# Patient Record
Sex: Female | Born: 1935 | Race: White | Hispanic: No | Smoking: Never smoker
Health system: Southern US, Community
[De-identification: ages and names within clinical notes are randomized; demographics above are authoritative.]

## PROBLEM LIST (undated history)

## (undated) DIAGNOSIS — C50919 Malignant neoplasm of unspecified site of unspecified female breast: Secondary | ICD-10-CM

## (undated) DIAGNOSIS — IMO0001 Reserved for inherently not codable concepts without codable children: Secondary | ICD-10-CM

## (undated) DIAGNOSIS — Z950 Presence of cardiac pacemaker: Secondary | ICD-10-CM

## (undated) DIAGNOSIS — M419 Scoliosis, unspecified: Secondary | ICD-10-CM

## (undated) DIAGNOSIS — S99912A Unspecified injury of left ankle, initial encounter: Secondary | ICD-10-CM

## (undated) DIAGNOSIS — E785 Hyperlipidemia, unspecified: Secondary | ICD-10-CM

## (undated) DIAGNOSIS — M810 Age-related osteoporosis without current pathological fracture: Secondary | ICD-10-CM

## (undated) DIAGNOSIS — G459 Transient cerebral ischemic attack, unspecified: Secondary | ICD-10-CM

## (undated) DIAGNOSIS — R011 Cardiac murmur, unspecified: Secondary | ICD-10-CM

## (undated) DIAGNOSIS — Z923 Personal history of irradiation: Secondary | ICD-10-CM

## (undated) DIAGNOSIS — M1990 Unspecified osteoarthritis, unspecified site: Secondary | ICD-10-CM

## (undated) DIAGNOSIS — K449 Diaphragmatic hernia without obstruction or gangrene: Secondary | ICD-10-CM

## (undated) DIAGNOSIS — H269 Unspecified cataract: Secondary | ICD-10-CM

## (undated) DIAGNOSIS — E039 Hypothyroidism, unspecified: Secondary | ICD-10-CM

## (undated) DIAGNOSIS — K219 Gastro-esophageal reflux disease without esophagitis: Secondary | ICD-10-CM

## (undated) DIAGNOSIS — R002 Palpitations: Secondary | ICD-10-CM

## (undated) DIAGNOSIS — I447 Left bundle-branch block, unspecified: Secondary | ICD-10-CM

## (undated) DIAGNOSIS — F329 Major depressive disorder, single episode, unspecified: Secondary | ICD-10-CM

## (undated) DIAGNOSIS — R5383 Other fatigue: Secondary | ICD-10-CM

## (undated) DIAGNOSIS — H409 Unspecified glaucoma: Secondary | ICD-10-CM

## (undated) DIAGNOSIS — I10 Essential (primary) hypertension: Secondary | ICD-10-CM

## (undated) DIAGNOSIS — H9190 Unspecified hearing loss, unspecified ear: Secondary | ICD-10-CM

## (undated) DIAGNOSIS — G629 Polyneuropathy, unspecified: Secondary | ICD-10-CM

## (undated) DIAGNOSIS — E041 Nontoxic single thyroid nodule: Secondary | ICD-10-CM

## (undated) DIAGNOSIS — F32A Depression, unspecified: Secondary | ICD-10-CM

## (undated) HISTORY — DX: Age-related osteoporosis without current pathological fracture: M81.0

## (undated) HISTORY — DX: Essential (primary) hypertension: I10

## (undated) HISTORY — DX: Presence of cardiac pacemaker: Z95.0

## (undated) HISTORY — DX: Hyperlipidemia, unspecified: E78.5

## (undated) HISTORY — DX: Hypothyroidism, unspecified: E03.9

## (undated) HISTORY — PX: EYE SURGERY: SHX253

## (undated) HISTORY — DX: Diaphragmatic hernia without obstruction or gangrene: K44.9

## (undated) HISTORY — DX: Nontoxic single thyroid nodule: E04.1

## (undated) HISTORY — DX: Left bundle-branch block, unspecified: I44.7

## (undated) HISTORY — PX: BREAST SURGERY: SHX581

## (undated) HISTORY — DX: Unspecified glaucoma: H40.9

## (undated) HISTORY — DX: Unspecified injury of left ankle, initial encounter: S99.912A

## (undated) HISTORY — DX: Malignant neoplasm of unspecified site of unspecified female breast: C50.919

## (undated) HISTORY — PX: MASTECTOMY PARTIAL / LUMPECTOMY: SUR851

## (undated) HISTORY — PX: THYROIDECTOMY, PARTIAL: SHX18

## (undated) HISTORY — DX: Transient cerebral ischemic attack, unspecified: G45.9

## (undated) HISTORY — DX: Unspecified cataract: H26.9

## (undated) HISTORY — PX: ABDOMINAL HYSTERECTOMY: SHX81

---
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
## 2003-07-24 HISTORY — PX: BREAST BIOPSY: SHX20

## 2004-07-06 ENCOUNTER — Ambulatory Visit: Payer: Self-pay | Admitting: Internal Medicine

## 2004-07-20 ENCOUNTER — Ambulatory Visit: Payer: Self-pay | Admitting: Internal Medicine

## 2004-10-20 ENCOUNTER — Ambulatory Visit: Payer: Self-pay | Admitting: Unknown Physician Specialty

## 2005-09-10 ENCOUNTER — Ambulatory Visit: Payer: Self-pay | Admitting: Internal Medicine

## 2005-10-27 ENCOUNTER — Emergency Department: Payer: Self-pay | Admitting: Emergency Medicine

## 2006-10-03 ENCOUNTER — Ambulatory Visit: Payer: Self-pay | Admitting: Internal Medicine

## 2007-10-08 ENCOUNTER — Ambulatory Visit: Payer: Self-pay | Admitting: Internal Medicine

## 2007-11-21 ENCOUNTER — Ambulatory Visit: Payer: Self-pay | Admitting: Unknown Physician Specialty

## 2008-10-12 ENCOUNTER — Ambulatory Visit: Payer: Self-pay | Admitting: Internal Medicine

## 2009-10-17 ENCOUNTER — Ambulatory Visit: Payer: Self-pay | Admitting: Internal Medicine

## 2010-11-16 ENCOUNTER — Ambulatory Visit: Payer: Self-pay | Admitting: Internal Medicine

## 2010-11-30 ENCOUNTER — Ambulatory Visit: Payer: Self-pay | Admitting: Internal Medicine

## 2011-07-24 DIAGNOSIS — Z923 Personal history of irradiation: Secondary | ICD-10-CM

## 2011-07-24 DIAGNOSIS — C50919 Malignant neoplasm of unspecified site of unspecified female breast: Secondary | ICD-10-CM

## 2011-07-24 HISTORY — PX: BREAST BIOPSY: SHX20

## 2011-07-24 HISTORY — PX: BREAST EXCISIONAL BIOPSY: SUR124

## 2011-07-24 HISTORY — DX: Malignant neoplasm of unspecified site of unspecified female breast: C50.919

## 2011-07-24 HISTORY — DX: Personal history of irradiation: Z92.3

## 2012-01-01 ENCOUNTER — Ambulatory Visit: Payer: Self-pay | Admitting: Internal Medicine

## 2012-01-02 ENCOUNTER — Ambulatory Visit: Payer: Self-pay | Admitting: Internal Medicine

## 2012-02-11 ENCOUNTER — Ambulatory Visit: Payer: Self-pay | Admitting: Surgery

## 2012-02-21 HISTORY — PX: BREAST LUMPECTOMY: SHX2

## 2012-03-04 ENCOUNTER — Ambulatory Visit: Payer: Self-pay | Admitting: Surgery

## 2012-03-04 DIAGNOSIS — I10 Essential (primary) hypertension: Secondary | ICD-10-CM

## 2012-03-04 LAB — POTASSIUM: Potassium: 3.9 mmol/L (ref 3.5–5.1)

## 2012-03-13 ENCOUNTER — Ambulatory Visit: Payer: Self-pay | Admitting: Surgery

## 2012-03-21 ENCOUNTER — Ambulatory Visit: Payer: Self-pay | Admitting: Oncology

## 2012-03-23 ENCOUNTER — Ambulatory Visit: Payer: Self-pay | Admitting: Oncology

## 2012-03-28 ENCOUNTER — Ambulatory Visit: Payer: Self-pay | Admitting: Radiation Oncology

## 2012-04-22 ENCOUNTER — Ambulatory Visit: Payer: Self-pay | Admitting: Oncology

## 2012-04-22 ENCOUNTER — Ambulatory Visit: Payer: Self-pay | Admitting: Radiation Oncology

## 2012-05-07 LAB — CBC CANCER CENTER
Basophil #: 0.1 x10 3/mm (ref 0.0–0.1)
Eosinophil %: 2.4 %
HGB: 11.9 g/dL — ABNORMAL LOW (ref 12.0–16.0)
Lymphocyte #: 1.2 x10 3/mm (ref 1.0–3.6)
MCV: 88 fL (ref 80–100)
Monocyte #: 0.4 x10 3/mm (ref 0.2–0.9)
Neutrophil #: 3.8 x10 3/mm (ref 1.4–6.5)
Neutrophil %: 66.9 %
Platelet: 303 x10 3/mm (ref 150–440)
RBC: 4.04 10*6/uL (ref 3.80–5.20)
RDW: 15.1 % — ABNORMAL HIGH (ref 11.5–14.5)
WBC: 5.7 x10 3/mm (ref 3.6–11.0)

## 2012-05-14 LAB — CBC CANCER CENTER
Basophil #: 0 x10 3/mm (ref 0.0–0.1)
Eosinophil #: 0.1 x10 3/mm (ref 0.0–0.7)
HCT: 36.7 % (ref 35.0–47.0)
Lymphocyte #: 1.1 x10 3/mm (ref 1.0–3.6)
Lymphocyte %: 20.7 %
MCHC: 32.1 g/dL (ref 32.0–36.0)
MCV: 90 fL (ref 80–100)
Monocyte %: 9.2 %
Neutrophil #: 3.5 x10 3/mm (ref 1.4–6.5)
Neutrophil %: 67.2 %
Platelet: 285 x10 3/mm (ref 150–440)
RBC: 4.08 10*6/uL (ref 3.80–5.20)
RDW: 15 % — ABNORMAL HIGH (ref 11.5–14.5)

## 2012-05-23 ENCOUNTER — Ambulatory Visit: Payer: Self-pay | Admitting: Oncology

## 2012-05-23 ENCOUNTER — Ambulatory Visit: Payer: Self-pay | Admitting: Radiation Oncology

## 2012-06-22 ENCOUNTER — Ambulatory Visit: Payer: Self-pay | Admitting: Radiation Oncology

## 2012-07-23 ENCOUNTER — Ambulatory Visit: Payer: Self-pay | Admitting: Radiation Oncology

## 2012-08-23 ENCOUNTER — Ambulatory Visit: Payer: Self-pay | Admitting: Oncology

## 2012-08-23 ENCOUNTER — Ambulatory Visit: Payer: Self-pay | Admitting: Radiation Oncology

## 2012-11-20 ENCOUNTER — Ambulatory Visit: Payer: Self-pay | Admitting: Oncology

## 2012-12-21 ENCOUNTER — Ambulatory Visit: Payer: Self-pay | Admitting: Oncology

## 2013-01-20 ENCOUNTER — Ambulatory Visit: Payer: Self-pay | Admitting: Oncology

## 2013-02-20 ENCOUNTER — Ambulatory Visit: Payer: Self-pay | Admitting: Oncology

## 2013-03-23 ENCOUNTER — Ambulatory Visit: Payer: Self-pay | Admitting: Oncology

## 2013-04-10 ENCOUNTER — Observation Stay: Payer: Self-pay | Admitting: Internal Medicine

## 2013-04-10 LAB — COMPREHENSIVE METABOLIC PANEL
Albumin: 3.5 g/dL (ref 3.4–5.0)
BUN: 29 mg/dL — ABNORMAL HIGH (ref 7–18)
Bilirubin,Total: 0.5 mg/dL (ref 0.2–1.0)
Calcium, Total: 8.9 mg/dL (ref 8.5–10.1)
Chloride: 107 mmol/L (ref 98–107)
Co2: 26 mmol/L (ref 21–32)
EGFR (African American): >60
EGFR (Non-African Amer.): 59 — ABNORMAL LOW
Osmolality: 283 (ref 275–301)
SGOT(AST): 23 U/L (ref 15–37)
Total Protein: 6.7 g/dL (ref 6.4–8.2)

## 2013-04-10 LAB — URINALYSIS, COMPLETE
Bacteria: NONE SEEN
Glucose,UR: NEGATIVE mg/dL (ref 0–75)
Ketone: NEGATIVE
Ph: 7 (ref 4.5–8.0)
Specific Gravity: 1.005 (ref 1.003–1.030)
Squamous Epithelial: NONE SEEN
WBC UR: 2 /HPF (ref 0–5)

## 2013-04-10 LAB — CBC
HCT: 33.6 % — ABNORMAL LOW (ref 35.0–47.0)
MCHC: 33.6 g/dL (ref 32.0–36.0)
MCV: 88 fL (ref 80–100)
Platelet: 256 10*3/uL (ref 150–440)
RBC: 3.84 10*6/uL (ref 3.80–5.20)
WBC: 5.1 10*3/uL (ref 3.6–11.0)

## 2013-04-11 LAB — LIPID PANEL
Cholesterol: 214 mg/dL — ABNORMAL HIGH (ref 0–200)
HDL Cholesterol: 65 mg/dL — ABNORMAL HIGH (ref 40–60)
Ldl Cholesterol, Calc: 131 mg/dL — ABNORMAL HIGH (ref 0–100)
Triglycerides: 89 mg/dL (ref 0–200)
VLDL Cholesterol, Calc: 18 mg/dL (ref 5–40)

## 2013-05-23 ENCOUNTER — Inpatient Hospital Stay: Payer: Self-pay | Admitting: Internal Medicine

## 2013-05-23 LAB — URINALYSIS, COMPLETE
Bacteria: NONE SEEN
Bilirubin,UR: NEGATIVE
Blood: NEGATIVE
Glucose,UR: NEGATIVE mg/dL (ref 0–75)
Hyaline Cast: 3
Nitrite: NEGATIVE
Ph: 6 (ref 4.5–8.0)
Protein: NEGATIVE
Specific Gravity: 1.006 (ref 1.003–1.030)

## 2013-05-23 LAB — CBC WITH DIFFERENTIAL/PLATELET
Basophil %: 0.8 %
Eosinophil %: 2.2 %
HCT: 33.1 % — ABNORMAL LOW (ref 35.0–47.0)
HGB: 11.3 g/dL — ABNORMAL LOW (ref 12.0–16.0)
Lymphocyte #: 0.8 10*3/uL — ABNORMAL LOW (ref 1.0–3.6)
Lymphocyte %: 16.7 %
MCH: 30 pg (ref 26.0–34.0)
MCHC: 34.2 g/dL (ref 32.0–36.0)
MCV: 88 fL (ref 80–100)
Monocyte #: 0.4 x10 3/mm (ref 0.2–0.9)
Monocyte %: 7.9 %
Neutrophil #: 3.7 10*3/uL (ref 1.4–6.5)
Neutrophil %: 72.4 %
RBC: 3.78 10*6/uL — ABNORMAL LOW (ref 3.80–5.20)
RDW: 15.2 % — ABNORMAL HIGH (ref 11.5–14.5)
WBC: 5.1 10*3/uL (ref 3.6–11.0)

## 2013-05-23 LAB — COMPREHENSIVE METABOLIC PANEL
Albumin: 3.6 g/dL (ref 3.4–5.0)
Anion Gap: 5 — ABNORMAL LOW (ref 7–16)
BUN: 27 mg/dL — ABNORMAL HIGH (ref 7–18)
Bilirubin,Total: 0.4 mg/dL (ref 0.2–1.0)
Calcium, Total: 9.3 mg/dL (ref 8.5–10.1)
Co2: 27 mmol/L (ref 21–32)
Creatinine: 1.11 mg/dL (ref 0.60–1.30)
EGFR (African American): 51 — ABNORMAL LOW
EGFR (Non-African Amer.): 44 — ABNORMAL LOW
Glucose: 162 mg/dL — ABNORMAL HIGH (ref 65–99)
SGOT(AST): 25 U/L (ref 15–37)
SGPT (ALT): 30 U/L (ref 12–78)
Sodium: 138 mmol/L (ref 136–145)
Total Protein: 6.7 g/dL (ref 6.4–8.2)

## 2013-05-23 LAB — CK TOTAL AND CKMB (NOT AT ARMC)
CK, Total: 93 U/L (ref 21–215)
CK-MB: 1.9 ng/mL (ref 0.5–3.6)

## 2013-05-23 LAB — TROPONIN I: Troponin-I: 0.06 ng/mL — ABNORMAL HIGH

## 2013-05-23 LAB — TSH: Thyroid Stimulating Horm: 1.84 u[IU]/mL

## 2013-05-24 LAB — CBC WITH DIFFERENTIAL/PLATELET
Basophil #: 0 10*3/uL (ref 0.0–0.1)
Basophil %: 0.7 %
Eosinophil #: 0.1 10*3/uL (ref 0.0–0.7)
Eosinophil %: 1.5 %
HGB: 10.8 g/dL — ABNORMAL LOW (ref 12.0–16.0)
Lymphocyte #: 0.9 10*3/uL — ABNORMAL LOW (ref 1.0–3.6)
Lymphocyte %: 14.2 %
MCH: 30.2 pg (ref 26.0–34.0)
MCHC: 34.8 g/dL (ref 32.0–36.0)
MCV: 87 fL (ref 80–100)
Monocyte %: 8.8 %
Neutrophil #: 4.9 10*3/uL (ref 1.4–6.5)
Platelet: 249 10*3/uL (ref 150–440)

## 2013-05-24 LAB — BASIC METABOLIC PANEL
Anion Gap: 8 (ref 7–16)
BUN: 22 mg/dL — ABNORMAL HIGH (ref 7–18)
Calcium, Total: 8.9 mg/dL (ref 8.5–10.1)
Chloride: 108 mmol/L — ABNORMAL HIGH (ref 98–107)
Creatinine: 0.95 mg/dL (ref 0.60–1.30)
EGFR (African American): >60
Glucose: 94 mg/dL (ref 65–99)
Osmolality: 281 (ref 275–301)
Potassium: 3.7 mmol/L (ref 3.5–5.1)
Sodium: 139 mmol/L (ref 136–145)

## 2013-05-24 LAB — CK TOTAL AND CKMB (NOT AT ARMC)
CK, Total: 74 U/L (ref 21–215)
CK-MB: 1.3 ng/mL (ref 0.5–3.6)
CK-MB: 1.1 ng/mL (ref 0.5–3.6)

## 2013-05-24 LAB — TROPONIN I
Troponin-I: 0.07 ng/mL — ABNORMAL HIGH
Troponin-I: 0.08 ng/mL — ABNORMAL HIGH

## 2013-05-25 ENCOUNTER — Other Ambulatory Visit: Payer: Self-pay

## 2013-05-25 LAB — CBC WITH DIFFERENTIAL/PLATELET
Basophil #: 0.1 10*3/uL (ref 0.0–0.1)
Basophil #: 0 10*3/uL (ref 0.0–0.1)
Basophil %: 1.4 %
Basophil %: 0.8 %
Eosinophil #: 0.3 10*3/uL (ref 0.0–0.7)
Eosinophil #: 0.2 10*3/uL (ref 0.0–0.7)
Eosinophil %: 2.6 %
HCT: 31 % — ABNORMAL LOW (ref 35.0–47.0)
HCT: 29 % — ABNORMAL LOW (ref 35.0–47.0)
HGB: 10.5 g/dL — ABNORMAL LOW (ref 12.0–16.0)
HGB: 10.1 g/dL — ABNORMAL LOW (ref 12.0–16.0)
Lymphocyte #: 0.9 10*3/uL — ABNORMAL LOW (ref 1.0–3.6)
Lymphocyte #: 0.7 10*3/uL — ABNORMAL LOW (ref 1.0–3.6)
Lymphocyte %: 18.6 %
Lymphocyte %: 11.5 %
MCH: 29.6 pg (ref 26.0–34.0)
MCH: 30.4 pg (ref 26.0–34.0)
MCHC: 34.7 g/dL (ref 32.0–36.0)
MCV: 87 fL (ref 80–100)
MCV: 88 fL (ref 80–100)
Monocyte #: 0.3 x10 3/mm (ref 0.2–0.9)
Monocyte %: 5.7 %
Neutrophil #: 4.5 10*3/uL (ref 1.4–6.5)
Neutrophil %: 79.4 %
Platelet: 238 10*3/uL (ref 150–440)
Platelet: 231 10*3/uL (ref 150–440)
RBC: 3.55 10*6/uL — ABNORMAL LOW (ref 3.80–5.20)
RBC: 3.31 10*6/uL — ABNORMAL LOW (ref 3.80–5.20)
RDW: 15.2 % — ABNORMAL HIGH (ref 11.5–14.5)
RDW: 15.6 % — ABNORMAL HIGH (ref 11.5–14.5)
WBC: 5.7 10*3/uL (ref 3.6–11.0)

## 2013-05-25 LAB — BASIC METABOLIC PANEL
Anion Gap: 5 — ABNORMAL LOW (ref 7–16)
Anion Gap: 1 — ABNORMAL LOW (ref 7–16)
BUN: 16 mg/dL (ref 7–18)
Calcium, Total: 8.5 mg/dL (ref 8.5–10.1)
Calcium, Total: 8 mg/dL — ABNORMAL LOW (ref 8.5–10.1)
Chloride: 110 mmol/L — ABNORMAL HIGH (ref 98–107)
Co2: 28 mmol/L (ref 21–32)
Creatinine: 1.04 mg/dL (ref 0.60–1.30)
EGFR (African American): 56 — ABNORMAL LOW
EGFR (African American): 56 — ABNORMAL LOW
EGFR (Non-African Amer.): 48 — ABNORMAL LOW
Glucose: 91 mg/dL (ref 65–99)
Glucose: 169 mg/dL — ABNORMAL HIGH (ref 65–99)
Osmolality: 283 (ref 275–301)
Potassium: 3.6 mmol/L (ref 3.5–5.1)
Sodium: 140 mmol/L (ref 136–145)
Sodium: 139 mmol/L (ref 136–145)

## 2013-05-25 LAB — PROTIME-INR
INR: 1.1
Prothrombin Time: 14 secs (ref 11.5–14.7)
Prothrombin Time: 13.9 secs (ref 11.5–14.7)

## 2013-05-25 LAB — APTT
Activated PTT: 31.8 secs (ref 23.6–35.9)
Activated PTT: 30.5 secs (ref 23.6–35.9)

## 2013-05-26 LAB — COMPREHENSIVE METABOLIC PANEL
Albumin: 2.7 g/dL — ABNORMAL LOW (ref 3.4–5.0)
Anion Gap: 5 — ABNORMAL LOW (ref 7–16)
BUN: 17 mg/dL (ref 7–18)
Chloride: 109 mmol/L — ABNORMAL HIGH (ref 98–107)
Co2: 26 mmol/L (ref 21–32)
EGFR (African American): 43 — ABNORMAL LOW
Potassium: 3.8 mmol/L (ref 3.5–5.1)
SGOT(AST): 19 U/L (ref 15–37)
SGPT (ALT): 16 U/L (ref 12–78)
Total Protein: 4.9 g/dL — ABNORMAL LOW (ref 6.4–8.2)

## 2013-05-26 LAB — CBC WITH DIFFERENTIAL/PLATELET
Basophil #: 0 10*3/uL (ref 0.0–0.1)
Basophil %: 0.8 %
Eosinophil #: 0.3 10*3/uL (ref 0.0–0.7)
Eosinophil %: 4.9 %
HCT: 26.5 % — ABNORMAL LOW (ref 35.0–47.0)
HGB: 9 g/dL — ABNORMAL LOW (ref 12.0–16.0)
Lymphocyte #: 0.6 10*3/uL — ABNORMAL LOW (ref 1.0–3.6)
Lymphocyte %: 9.8 %
MCH: 30 pg (ref 26.0–34.0)
MCHC: 34.1 g/dL (ref 32.0–36.0)
MCV: 88 fL (ref 80–100)
Monocyte #: 0.4 x10 3/mm (ref 0.2–0.9)
Monocyte %: 7.8 %
Neutrophil #: 4.4 10*3/uL (ref 1.4–6.5)
Neutrophil %: 76.7 %
Platelet: 200 10*3/uL (ref 150–440)
RBC: 3.02 10*6/uL — ABNORMAL LOW (ref 3.80–5.20)
RDW: 15.1 % — ABNORMAL HIGH (ref 11.5–14.5)
WBC: 5.7 10*3/uL (ref 3.6–11.0)

## 2013-05-26 LAB — HEMOGLOBIN: HGB: 8.8 g/dL — ABNORMAL LOW (ref 12.0–16.0)

## 2013-05-26 LAB — HEMATOCRIT: HCT: 26 % — ABNORMAL LOW

## 2013-05-27 LAB — COMPREHENSIVE METABOLIC PANEL
Albumin: 2.9 g/dL — ABNORMAL LOW (ref 3.4–5.0)
Alkaline Phosphatase: 71 U/L (ref 50–136)
Anion Gap: 4 — ABNORMAL LOW (ref 7–16)
BUN: 26 mg/dL — ABNORMAL HIGH (ref 7–18)
Bilirubin,Total: 0.3 mg/dL (ref 0.2–1.0)
Calcium, Total: 7.9 mg/dL — ABNORMAL LOW (ref 8.5–10.1)
Chloride: 106 mmol/L (ref 98–107)
Co2: 27 mmol/L (ref 21–32)
Creatinine: 1.43 mg/dL — ABNORMAL HIGH (ref 0.60–1.30)
EGFR (African American): 38 — ABNORMAL LOW
EGFR (Non-African Amer.): 33 — ABNORMAL LOW
Glucose: 106 mg/dL — ABNORMAL HIGH (ref 65–99)
Osmolality: 279 (ref 275–301)
Potassium: 3.7 mmol/L (ref 3.5–5.1)
SGOT(AST): 23 U/L (ref 15–37)
SGPT (ALT): 13 U/L (ref 12–78)
Sodium: 137 mmol/L (ref 136–145)
Total Protein: 5.9 g/dL — ABNORMAL LOW (ref 6.4–8.2)

## 2013-05-27 LAB — CBC WITH DIFFERENTIAL/PLATELET
Basophil #: 0 10*3/uL (ref 0.0–0.1)
Basophil %: 0.4 %
Eosinophil #: 0.1 10*3/uL (ref 0.0–0.7)
Eosinophil %: 1.5 %
HCT: 25.3 % — ABNORMAL LOW (ref 35.0–47.0)
HGB: 8.8 g/dL — ABNORMAL LOW (ref 12.0–16.0)
Lymphocyte #: 1.3 10*3/uL (ref 1.0–3.6)
Lymphocyte %: 16.3 %
MCH: 30.5 pg (ref 26.0–34.0)
MCHC: 34.6 g/dL (ref 32.0–36.0)
MCV: 88 fL (ref 80–100)
Monocyte #: 0.7 x10 3/mm (ref 0.2–0.9)
Monocyte %: 9.1 %
Neutrophil #: 5.7 10*3/uL (ref 1.4–6.5)
Neutrophil %: 72.7 %
Platelet: 206 10*3/uL (ref 150–440)
RBC: 2.88 10*6/uL — ABNORMAL LOW (ref 3.80–5.20)
RDW: 14.9 % — ABNORMAL HIGH (ref 11.5–14.5)
WBC: 7.9 10*3/uL (ref 3.6–11.0)

## 2013-05-28 LAB — COMPREHENSIVE METABOLIC PANEL
Alkaline Phosphatase: 67 U/L (ref 50–136)
Anion Gap: 3 — ABNORMAL LOW (ref 7–16)
Bilirubin,Total: 0.3 mg/dL (ref 0.2–1.0)
Co2: 27 mmol/L (ref 21–32)
EGFR (African American): 43 — ABNORMAL LOW
Osmolality: 281 (ref 275–301)
Sodium: 137 mmol/L (ref 136–145)

## 2013-05-28 LAB — CBC WITH DIFFERENTIAL/PLATELET
Basophil #: 0 10*3/uL (ref 0.0–0.1)
Basophil %: 0.3 %
Eosinophil #: 0 10*3/uL (ref 0.0–0.7)
Eosinophil %: 0.6 %
HCT: 20.9 % — ABNORMAL LOW (ref 35.0–47.0)
HGB: 7.2 g/dL — ABNORMAL LOW (ref 12.0–16.0)
Lymphocyte #: 0.5 10*3/uL — ABNORMAL LOW (ref 1.0–3.6)
Lymphocyte %: 7.6 %
MCH: 30.3 pg (ref 26.0–34.0)
MCHC: 34.7 g/dL (ref 32.0–36.0)
Monocyte #: 0.5 x10 3/mm (ref 0.2–0.9)
Monocyte %: 8 %
Neutrophil #: 5 10*3/uL (ref 1.4–6.5)
Platelet: 178 10*3/uL (ref 150–440)
RBC: 2.39 10*6/uL — ABNORMAL LOW (ref 3.80–5.20)
RDW: 14.7 % — ABNORMAL HIGH (ref 11.5–14.5)

## 2013-05-29 LAB — CBC WITH DIFFERENTIAL/PLATELET
Basophil %: 0.9 %
Eosinophil #: 0.3 10*3/uL (ref 0.0–0.7)
HCT: 24.9 % — ABNORMAL LOW (ref 35.0–47.0)
HGB: 8.7 g/dL — ABNORMAL LOW (ref 12.0–16.0)
MCHC: 34.9 g/dL (ref 32.0–36.0)
Monocyte %: 9.3 %
Neutrophil #: 2.8 10*3/uL (ref 1.4–6.5)
Neutrophil %: 63.2 %
Platelet: 160 10*3/uL (ref 150–440)
RDW: 15.4 % — ABNORMAL HIGH (ref 11.5–14.5)
WBC: 4.4 10*3/uL (ref 3.6–11.0)

## 2013-05-29 LAB — BASIC METABOLIC PANEL
Anion Gap: 5 — ABNORMAL LOW (ref 7–16)
Calcium, Total: 8.1 mg/dL — ABNORMAL LOW (ref 8.5–10.1)
Chloride: 105 mmol/L (ref 98–107)
Co2: 29 mmol/L (ref 21–32)
Creatinine: 1.05 mg/dL (ref 0.60–1.30)
EGFR (Non-African Amer.): 47 — ABNORMAL LOW
Glucose: 101 mg/dL — ABNORMAL HIGH (ref 65–99)
Osmolality: 283 (ref 275–301)
Potassium: 3.9 mmol/L (ref 3.5–5.1)
Sodium: 139 mmol/L (ref 136–145)

## 2013-06-01 LAB — BASIC METABOLIC PANEL
Calcium, Total: 8.8 mg/dL (ref 8.5–10.1)
Chloride: 101 mmol/L (ref 98–107)
Co2: 35 mmol/L — ABNORMAL HIGH (ref 21–32)
Creatinine: 0.89 mg/dL (ref 0.60–1.30)
EGFR (Non-African Amer.): 58 — ABNORMAL LOW

## 2013-06-01 LAB — CBC WITH DIFFERENTIAL/PLATELET
Basophil #: 0.1 10*3/uL (ref 0.0–0.1)
Basophil %: 0.9 %
HCT: 28.8 % — ABNORMAL LOW (ref 35.0–47.0)
MCH: 29.3 pg (ref 26.0–34.0)
MCHC: 34.3 g/dL (ref 32.0–36.0)
Neutrophil %: 74.1 %
Platelet: 247 10*3/uL (ref 150–440)
RBC: 3.37 10*6/uL — ABNORMAL LOW (ref 3.80–5.20)
RDW: 14.7 % — ABNORMAL HIGH (ref 11.5–14.5)
WBC: 5.6 10*3/uL (ref 3.6–11.0)

## 2013-06-03 ENCOUNTER — Encounter: Payer: Self-pay | Admitting: Internal Medicine

## 2013-06-04 LAB — CBC WITH DIFFERENTIAL/PLATELET
Basophil #: 0.1 10*3/uL (ref 0.0–0.1)
Basophil %: 0.8 %
Eosinophil #: 0.2 10*3/uL (ref 0.0–0.7)
Eosinophil %: 2.6 %
HCT: 33.3 % — ABNORMAL LOW (ref 35.0–47.0)
MCH: 29.3 pg (ref 26.0–34.0)
Monocyte %: 8.2 %
Neutrophil #: 6.6 10*3/uL — ABNORMAL HIGH (ref 1.4–6.5)
Neutrophil %: 80.6 %
Platelet: 345 10*3/uL (ref 150–440)
RDW: 14.7 % — ABNORMAL HIGH (ref 11.5–14.5)

## 2013-06-04 LAB — BASIC METABOLIC PANEL
Anion Gap: 6 — ABNORMAL LOW (ref 7–16)
Calcium, Total: 8.7 mg/dL (ref 8.5–10.1)
EGFR (African American): 60 — ABNORMAL LOW
EGFR (Non-African Amer.): 52 — ABNORMAL LOW
Osmolality: 276 (ref 275–301)
Potassium: 3.9 mmol/L (ref 3.5–5.1)
Sodium: 136 mmol/L (ref 136–145)

## 2013-06-05 ENCOUNTER — Ambulatory Visit: Payer: Self-pay | Admitting: Cardiothoracic Surgery

## 2013-06-09 LAB — BASIC METABOLIC PANEL
Anion Gap: 5 — ABNORMAL LOW (ref 7–16)
Calcium, Total: 9 mg/dL (ref 8.5–10.1)
Co2: 28 mmol/L (ref 21–32)
Creatinine: 0.99 mg/dL (ref 0.60–1.30)
EGFR (African American): 59 — ABNORMAL LOW
Glucose: 135 mg/dL — ABNORMAL HIGH (ref 65–99)
Potassium: 3.9 mmol/L (ref 3.5–5.1)
Sodium: 138 mmol/L (ref 136–145)

## 2013-06-09 LAB — CBC WITH DIFFERENTIAL/PLATELET
Basophil #: 0.1 10*3/uL (ref 0.0–0.1)
Eosinophil #: 0.2 10*3/uL (ref 0.0–0.7)
Eosinophil %: 2.7 %
HCT: 33.7 % — ABNORMAL LOW (ref 35.0–47.0)
MCH: 29.4 pg (ref 26.0–34.0)
MCHC: 33.8 g/dL (ref 32.0–36.0)
MCV: 87 fL (ref 80–100)
Monocyte #: 0.6 x10 3/mm (ref 0.2–0.9)
Monocyte %: 7.7 %
Platelet: 406 10*3/uL (ref 150–440)
WBC: 7.4 10*3/uL (ref 3.6–11.0)

## 2013-06-11 ENCOUNTER — Ambulatory Visit: Payer: Self-pay | Admitting: Cardiothoracic Surgery

## 2013-06-15 ENCOUNTER — Ambulatory Visit: Payer: Self-pay | Admitting: Gerontology

## 2013-06-22 ENCOUNTER — Ambulatory Visit: Payer: Self-pay | Admitting: Cardiothoracic Surgery

## 2013-06-22 ENCOUNTER — Encounter: Payer: Self-pay | Admitting: Internal Medicine

## 2013-07-23 ENCOUNTER — Ambulatory Visit: Payer: Self-pay | Admitting: Cardiothoracic Surgery

## 2013-07-23 ENCOUNTER — Encounter: Payer: Self-pay | Admitting: Internal Medicine

## 2013-08-23 ENCOUNTER — Ambulatory Visit: Payer: Self-pay | Admitting: Cardiothoracic Surgery

## 2013-09-04 DIAGNOSIS — H409 Unspecified glaucoma: Secondary | ICD-10-CM | POA: Insufficient documentation

## 2013-09-04 DIAGNOSIS — M810 Age-related osteoporosis without current pathological fracture: Secondary | ICD-10-CM | POA: Insufficient documentation

## 2013-11-25 ENCOUNTER — Ambulatory Visit: Payer: Self-pay | Admitting: Radiation Oncology

## 2014-01-06 ENCOUNTER — Ambulatory Visit: Payer: Self-pay | Admitting: Oncology

## 2014-01-08 ENCOUNTER — Ambulatory Visit: Payer: Self-pay | Admitting: Oncology

## 2014-01-11 DIAGNOSIS — E041 Nontoxic single thyroid nodule: Secondary | ICD-10-CM | POA: Insufficient documentation

## 2014-07-26 ENCOUNTER — Ambulatory Visit: Payer: Self-pay | Admitting: Oncology

## 2014-08-23 ENCOUNTER — Ambulatory Visit: Payer: Self-pay | Admitting: Oncology

## 2014-09-21 ENCOUNTER — Ambulatory Visit
Admit: 2014-09-21 | Disposition: A | Discharge status: Home / Self Care | Payer: Self-pay | Attending: Oncology | Admitting: Oncology

## 2014-11-09 NOTE — Op Note (Signed)
PATIENT NAME:  Sue Knapp, GIRDLER MR#:  [id number] DATE OF BIRTH:  [birth year]  DATE OF PROCEDURE:  03/13/2012  PREOPERATIVE DIAGNOSIS: Carcinoma of the left breast.   POSTOPERATIVE DIAGNOSIS: Carcinoma of the left breast.   PROCEDURE: Left partial mastectomy with axillary sentinel lymph node biopsy.   SURGEON: Loreli Dollar, MD   ANESTHESIA: General.   INDICATIONS: This 81 year old female recently had a mammogram depicting microcalcifications in the inferior aspect of the left breast. She had a stereotactic biopsy which demonstrated cancer and surgery was recommended for definitive treatment. She did have preoperative insertion of Kopans wire and I reviewed the mammograms with the wire in place and could see the site of the biopsy marker. Also, she had preoperative injection of radioactive technetium sulfur colloid.   DESCRIPTION OF PROCEDURE: The patient was placed on the operating table in the supine position under general anesthesia. The dressing was removed from the left breast exposing the Kopans wire which was cut 3 cm from the skin. The left arm was placed on a lateral arm support. The left breast, chest wall, axilla, and upper arm were prepared with ChloraPrep and draped in a sterile manner.   The gamma counter was used to probe the axilla demonstrating location of radioactivity in the inferior aspect of the axilla. An oblique incision was made in the inferior aspect of the axilla and dissected down through subcutaneous tissues using electrocautery for hemostasis. The superficial fascia was incised dissecting down to the axillary fat pad and retracted the pectoralis major muscle. The gamma counter was used for direction and encountered a radioactive lymph node deep within the inferior aspect of the axilla adjacent to the rib cage. This lymph node was small in size and was dissected free from surrounding structures along with some surrounding fatty tissues using electrocautery for  hemostasis. The ex vivo count was approximately 150 counts per second. Background count was less than 14 counts per second. There was no remaining palpable mass within the axilla. The lymph node was sent for frozen section. The wound was inspected. Hemostasis was intact.   Next, attention was turned to do the partial mastectomy. The Kopans wire was again viewed, mammograms viewed, and curvilinear incision was made in the inferior aspect of the left breast from the 4 o'clock to the 8 o'clock position and excised with the specimen and ellipse of skin which was approximately 1 cm wide. The incision was approximately 2 cm from the border of the areola. The dissection was carried down through subcutaneous tissues and encountered the wire and removed a sample of tissue surrounding the wire which was approximately 5 x 5 x 5 cm in dimension and this is labeled with a stitch at the 4 o'clock position of the skin ellipse and also margin markers were used to mark the deep medial, lateral, cranial, and caudal margins and submitted for specimen mammogram and pathology. The wound was inspected. Several small bleeding points were cauterized. One clamped vessel was suture ligated with 4-0 chromic. Tissues were infiltrated with 0.5% Sensorcaine with epinephrine.  The axillary wound was further inspected and hemostasis appeared to be intact. The wound was closed with a running 5-0 Monocryl subcuticular suture.   Next, the partial mastectomy wound was further inspected and subcutaneous tissues were closed with 4-0 chromic. The skin was closed with running 5-0 Monocryl subcuticular suture.   The pathologist called back two times during the procedure, first to indicate that the sentinel lymph node appeared to be negative and the  second time to report that the margins appeared to be satisfactory.   Subsequently, both wounds were treated with Dermabond. The patient tolerated surgery satisfactorily and was prepared for transfer  to the recovery room.   ____________________________ Lenna Sciara. Rochel Brome, MD jws:drc D: 03/13/2012 12:58:33 ET T: 03/13/2012 13:17:53 ET JOB#: [id number]  cc: Loreli Dollar, MD, <Dictator> Loreli Dollar MD ELECTRONICALLY SIGNED 03/14/2012 23:03

## 2014-11-09 NOTE — Consult Note (Signed)
Reason for Visit: This 81 year old Female patient presents to the clinic for initial evaluation of  Breast cancer .   Referred by Dr. Grayland Ormond.  Diagnosis:   Chief Complaint/Diagnosis   81 year old female with a invasive mammary carcinoma of the left breast pathologic stage I (T1 B. N0 M0) ER positive PR low positivity HER-2/neu not overexpressed   Pathology Report Pathology report reviewed    Imaging Report Mammograms and ultrasound reviewed    Referral Report Clinical notes reviewed    Planned Treatment Regimen Adjuvant radiation therapy to left breast    HPI   patient is a 81 year old female presented with an abnormal mammogram of the left breast with no palpable mass. She was seen by Dr. Tamala Julian and underwent biopsy showing ER-positive borderline PR positive invasive mammary carcinoma. She underwent a wide local excision for a 1.0 cm invasive mammary carcinoma with ductal carcinoma in situ present. One sentinel lymph node was negative for metastatic disease. Margins were clear but close at 4 mm for the invasive component and 6 mm for ductal carcinoma component. Patient is seen today for consideration of radiation therapy to her left breast. Dr. Tamala Julian has evaluated the patient and feels she is not candidate for accelerated partial breast irradiation.  Past Hx:    neuropathy:    Hyperlipidemia:    osteoporosis:    Hiatal hernia:    breast cancer:    glaucoma:    HOH:    Arthritis:    HTN:    bladder surgery:    bladder:    hysterectomy:    thyroidectomy:    breast biopsy:   Past, Family and Social History:   Past Medical History positive    Cardiovascular hyperlipidemia; hypertension    Past Surgical History Thyroidectomy, hysterectomy, bladder tuck    Past Medical History Comments Arthritis, glaucoma, osteoporosis, hiatal hernia    Family History noncontributory    Social History noncontributory    Additional Past Medical and Surgical History Seen by  herself today   Allergies:   Codeine: Alt Ment Status  Fosamax: N/V/Diarrhea  Etodolac: GI Distress  Home Meds:  Home Medications: Medication Instructions Status  HCTZ 75m 1 tab(s) orally once a day (in the morning) Active  Diovan 320 mg oral tablet 1 tab(s) orally once a day in am Active  omeprazole 216m1 tab(s) orally prn Active  vitamin B 12 1000 mcg 1 tab(s) orally once a day (in the morning) Active  calcium 600 plus D 1 tab(s) orally once a day (in the morning) Active  Osteo Bi-Flex Double Strength 1 tab(s) orally once a day in am Active  Vitamin D3 1000 intl units oral tablet 1 tab(s) orally once a day Active  Aleve 220 mg oral tablet 1 tab(s) orally every 8 hours, As Needed- for Pain  Active  melatonin 5 mg oral tablet 1 tab(s) orally once a day (at bedtime) only as needed. Active   Review of Systems:   General negative    Performance Status (ECOG) 0    Skin negative    Breast negative    Ophthalmologic negative    ENMT negative    Respiratory and Thorax negative    Cardiovascular negative    Gastrointestinal negative    Genitourinary negative    Musculoskeletal negative    Neurological negative    Psychiatric negative    Hematology/Lymphatics negative    Endocrine negative    Allergic/Immunologic negative    Review of Systems   Patient denies  any weight loss, fatigue, weakness, fever, chills or night sweats. Patient denies any loss of vision, blurred vision. Patient denies any ringing  of the ears or hearing loss. No irregular heartbeat. Patient denies heart murmur or history of fainting. Patient denies any chest pain or pain radiating to her upper extremities. Patient denies any shortness of breath, difficulty breathing at night, cough or hemoptysis. Patient denies any swelling in the lower legs. Patient denies any nausea vomiting, vomiting of blood, or coffee ground material in the vomitus. Patient denies any stomach pain. Patient states has had  normal bowel movements no significant constipation or diarrhea. Patient denies any dysuria, hematuria or significant nocturia. Patient denies any problems walking, swelling in the joints or loss of balance. Patient denies any skin changes, loss of hair or loss of weight. Patient denies any excessive worrying or anxiety or significant depression. Patient denies any problems with insomnia. Patient denies excessive thirst, polyuria, polydipsia. Patient denies any swollen glands, patient denies easy bruising or easy bleeding. Patient denies any recent infections, allergies or URI. Patient "s visual fields have not changed significantly in recent time.  Nursing Notes:  Nursing Vital Signs and Chemo Nursing Nursing Notes: *CC Vital Signs Flowsheet:   06-Sep-13 10:58   Temp Temperature 99.3   Pulse Pulse 67   Respirations Respirations 20   SBP SBP 167   DBP DBP 79   Pain Scale (0-10)  1   Current Weight (kg) (kg) 62.2   Height (cm) centimeters 152   BSA (m2) 1.5   Physical Exam:  General/Skin/HEENT:   General normal    Skin normal    Eyes normal    ENMT normal    Head and Neck normal    Additional PE Well-developed female in NAD. She status post wide local excision of the left breast which is healed well. No dominant mass or nodularity is noted in either breast into position examined. No axillary or supraclavicular adenopathy is identified. Lungs are clear to A&P cardiac examination shows regular rate and rhythm.   Breasts/Resp/CV/GI/GU:   Respiratory and Thorax normal    Cardiovascular normal    Gastrointestinal normal    Genitourinary normal   MS/Neuro/Psych/Lymph:   Musculoskeletal normal    Neurological normal    Lymphatics normal   Assessment and Plan:  Impression:   stage I invasive mammary carcinoma of the left breast in 89-year-old female.  Plan:   patient has difficulty taking care of her husband who was confirmed at home. We attempted to have her evaluated for  accelerated partial breast radiation although this cannot be performed based on the surgeon's opinion. I would like to go ahead with a short course of radiation therapy delivering 4250 cGy in 16 fractions to the whole breast. She meets the criteria of age, no need for boost of her scar, and no need for adjuvant chemotherapy. Patient will also be an aromatase inhibitor after completion of radiation. I have set her up for CT simulation. and benefits of treatment including higher incidence of skin reaction from higher daily dose fractionation, inclusion of small area of superficial lung, fatigue, alteration of blood counts were all explained in detail to the patient.  I would like to take this opportunity to thank you for allowing me to continue to participate in this patient's care.  CC Referral:   cc: Dr. Smith, Dr. Larry Harper   Electronic Signatures: Chrystal, Glenn S (MD)  (Signed 24-Sep-13 15:28)  Authored: HPI, Diagnosis, Past Hx, PFSH, Allergies, Home   Meds, ROS, Nursing Notes, Physical Exam, Encounter Assessment and Plan, CC Referring Physician   Last Updated: 24-Sep-13 15:28 by Armstead Peaks (MD)

## 2014-11-12 NOTE — H&P (Signed)
PATIENT NAME:  Sue Knapp, GORAL MR#:  [id number] DATE OF BIRTH:  06/28/1923  DATE OF ADMISSION:  05/23/2013  REFERRING PHYSICIAN: Dr. Cinda Quest.   FAMILY PHYSICIAN: Dr. Emily Filbert.   REASON FOR ADMISSION: Near syncope with bradycardia.   HISTORY OF PRESENT ILLNESS: The patient is an 81 year old female who presents to the Emergency Room with shortness of breath, weakness and near syncope. In the Emergency Room, the patient was found to be profoundly bradycardic with heart rates dipping down into the 30s. Her blood pressure is stable, and she denies chest pain. Her troponin was mildly elevated. She is now admitted for further evaluation.   PAST MEDICAL HISTORY: 1.  Breast cancer, status post lumpectomy.  2.  Peripheral neuropathy.  3.  Hiatal hernia.  4.  Hyperlipidemia.  5.  Benign hypertension.  6.  Osteoarthritis.  7.  Osteoporosis.  8.  Bilateral hearing loss.  9.  Glaucoma.  10.  Status post partial thyroidectomy.  11.  Status post hysterectomy.  12.  Status post bladder surgery.   MEDICATIONS: 1.  Fosamax 70 mg p.o. q. week.  2.  Plavix 75 mg p.o. daily.  3.  Aspirin 81 mg p.o. daily.  4.  Diovan 320 mg p.o. daily.  5.  Pravastatin 20 mg p.o. daily.  letrazole 2.5 mg p.o. daily.  7.  Hydrochlorothiazide 25 mg p.o. daily.   ALLERGIES: CODEINE AND LODINE.   SOCIAL HISTORY: Negative for alcohol or tobacco abuse.   FAMILY HISTORY: Positive for coronary artery disease, diabetes, stroke.  Breast cancer.   REVIEW OF SYSTEMS: CONSTITUTIONAL: No fever or change in weight.  EYES: No blurred or double vision.  ENT: No tinnitus or hearing loss. No nasal discharge or bleeding.  RESPIRATORY: No cough or wheezing. Denies hemoptysis.  CARDIOVASCULAR: No palpitations or syncope. No chest pain.  GASTROINTESTINAL: No vomiting or diarrhea, although she does have some nausea. No abdominal pain or change in bowel habits.  GENITOURINARY: No dysuria or hematuria.  ENDOCRINE: No polyuria  or polydipsia. No heat or cold intolerance.  HEMATOLOGIC: The patient denies anemia, easy bruising, or bleeding.  LYMPHATIC: No swollen glands.  MUSCULOSKELETAL: The patient denies pain in her neck, back, shoulders, knees, or hips. No gout.  NEUROLOGIC: No numbness or migraines. Denies stroke or seizures.  PSYCHIATRIC: The patient denies anxiety, insomnia or depression.   PHYSICAL EXAMINATION: GENERAL: The patient is in no acute distress.  VITAL SIGNS: Vital signs are currently remarkable for a blood pressure of 180/70, with a heart rate of 40 and a respiratory rate of 18. She is afebrile.  HEENT: Normocephalic, atraumatic. Pupils equally round and reactive to light and accommodation. Extraocular movements are intact. Sclerae are  nonicteric. Conjunctivae are clear.  OROPHARYNX: Clear.  NECK: Supple without JVD. No adenopathy or thyromegaly is noted.  LUNGS: Reveal decreased breath sounds at the bases with faint basilar rales. No wheezes or rhonchi. No dullness. Respiratory effort is normal.  CARDIAC: Slow rate with a regular rhythm. Normal S1, S2. No significant rubs or murmurs.  ABDOMEN: Soft, nontender, with normoactive bowel sounds. No organomegaly or masses were appreciated. No hernias or bruits were noted.  EXTREMITIES: Without clubbing, cyanosis or edema. Pulses were 2+ bilaterally.  SKIN: Warm and dry without rash or lesions.  NEUROLOGIC: Cranial nerves II through XII grossly intact. Deep tendon reflexes are symmetric. Motor and sensory exam is nonfocal.  PSYCHIATRIC: Exam revealed a patient who is alert and oriented to person, place, time. She was cooperative and used good  judgment.   LABORATORY DATA: White count was 5.1 with a hemoglobin of 11.3. Glucose 162 with a BUN of 27, creatinine 1.11. GFR was 44. Troponin was 0.06. Chest x-ray was negative.   ASSESSMENT: 1.  Symptomatic bradycardia.  2.  Near syncope.  3.  Shortness of breath.  4.  Benign hypertension.  5.  Breast  cancer.  6.  Hyperglycemia.  7.  Anemia of chronic disease.   PLAN: The patient will be admitted to the Intensive Care Unit as a FULL CODE. We will consult Cardiology urgently for bradycardia. We will follow her sugars. We will follow serial cardiac enzymes and obtain an echocardiogram. Further treatment and evaluation will depend upon the patient's progress.   Total time spent time spent on this patient was 50 minutes.   ____________________________ Leonie Douglas Doy Hutching, MD jds:dp D: 05/23/2013 17:57:54 ET T: 05/23/2013 19:26:34 ET JOB#: [id number]  cc: Leonie Douglas. Doy Hutching, MD, <Dictator> Rusty Aus, MD Telicia Hodgkiss Lennice Sites MD ELECTRONICALLY SIGNED 05/24/2013 10:53

## 2014-11-12 NOTE — Op Note (Signed)
PATIENT NAME:  Sue Knapp, Sue Knapp MR#:  [id number] DATE OF BIRTH:  09/04/1922  DATE OF PROCEDURE:  05/25/2013  ADDENDUM:    CLINICAL HISTORY:  The patient had high-grade AV block and underwent implantation of DDD pacemaker on the left side in the Operating Room and was transferred to the Recovery Room. The patient's condition was stable. After coming to ICU, the patient dropped some heartbeat and there was some loss of capture noted on the telemetry so it was decided to take the patient back to the Operating Room.   The  patient was taken back to the Operating Room at 5:00 p.m. and pocket was opened after complete sterilization and draping of the left upper chest. Then 1% lidocaine was infiltrated on the previously-operated side and stitches were opened. The pacemaker cavity was washed with gentamicin and ventricular lead was taken out from the pacemaker and then, with the help of a wire, it was repositioned into the apex of the right ventricle. Stimulation thresholds were noted again and on the right ventricular side R wave was 11.3, impedance 617, threshold 0.5 and current 0.8. Atrial lead was checked again and R wave was 5.5, resistance 445, threshold 0.3 and current 0.8. Both leads were hooked back to the pacemaker which was Adapta [id number] made by [employer]. No new leads were used. All connections were tightened securely and pacemaker lead was secured to the pectoralis major muscle on the top of the suture sleeve. The patient was transferred again to the recovery room in a stable condition. Chest x-ray is being obtained to further evaluate the position of the pacemaker lead. Family was notified about the progress.    ____________________________ Cletis Athens, MD jm:cs D: 05/25/2013 18:27:00 ET T: 05/25/2013 20:04:29 ET JOB#: [id number]  cc: Cletis Athens, MD, <Dictator> Cletis Athens MD ELECTRONICALLY SIGNED 06/29/2013 17:22

## 2014-11-12 NOTE — Consult Note (Signed)
PATIENT NAME:  Sue Knapp, Sue Knapp MR#:  [id number] DATE OF BIRTH:  June 30, 1923  DATE OF CONSULTATION:  05/23/2013  REFERRING PHYSICIAN: Georgie Chard, MD  PRIMARY CARE PHYSICIAN:  Emily Filbert, MD  CONSULTING PHYSICIAN: Rayanna Matusik D. Clayborn Bigness, M.D.   INDICATION: Syncope and bradycardia.   HISTORY OF PRESENT ILLNESS: The patient is an 81 year old white female who came to the Emergency Room with shortness of breath, weakness, and syncope. The patient was found to be profoundly bradycardic, rates in the 30s. Blood pressure was stable and in fact elevated. Denied any chest pain. Troponin was borderline so she was admitted for further evaluation. Denies being on any medications that would slow her heart rate. Denies any recent tick bites.   PAST MEDICAL HISTORY:  Breast cancer, peripheral neuropathy, hiatal hernia, hyperlipidemia, benign hypertension, osteoarthritis, osteoporosis, hearing loss, glaucoma.   PAST SURGICAL HISTORY: Lumpectomy, thyroid surgery, hysterectomy and bladder surgery.   MEDICATIONS:  Fosamax 70 mg a week, Plavix 75 a day, aspirin 81 a day, Diovan 320 a day, Pravachol 20 mg daily, letrozole 2.5 daily, HCTZ 25 daily.   ALLERGIES: CODEINE AND LODINE.   SOCIAL HISTORY: Retired. No smoking or alcohol consumption.  FAMILY HISTORY: Coronary artery disease, diabetes, stroke, breast cancer.   REVIEW OF SYSTEMS:  Weakness and fatigue, near syncope. Denies nausea, vomiting. No fever, no chills, no sweats. No weight loss or weight gain, hemoptysis, hematemesis. No bright red blood per rectum.    PHYSICAL EXAMINATION: VITAL SIGNS: Blood pressure 180/70, heart rate of about 35 to 40, slightly irregular and respiratory rate 14, afebrile.  HEENT: Normocephalic, atraumatic. Pupils equal and reactive to light.  NECK: Supple. No significant JVD, bruits, or adenopathy.  LUNGS: Exam was clear to auscultation and percussion. No significant wheeze, rhonchi, or rale. HEART: Bradycardic. Systolic  ejection murmur at the apex.  ABDOMEN: Benign.  EXTREMITIES: Within normal limits. NEUROLOGIC: Intact.  SKIN: Normal.   LABORATORY AND DIAGNOSTICS: White count 5, hemoglobin 11, glucose 162, BUN 27, creatinine 1.1, GFR 48. Troponin 0.06. Chest x-ray negative. EKG: Second degree heart block, rate of 35, nonspecific findings.   ASSESSMENT:   1.  Second degree heart block. 2.  Symptomatic bradycardia. 3.  Near syncope. 4.  Shortness of breath. 5.  Malignant hypertension.  6.  Hyperglycemia.  7.  Anemia 8.  Breast cancer.   PLAN: Agree with admit to the unit.  Placed external patch. Since she is hemodynamically stable, we will hold off on temporary pacemaker. We will make arrangements for a permanent pacemaker. Rule out for myocardial infarction. Consider TTE  echocardiogram. Consider follow-up EKGs.  Control blood pressure with medication that will not slow her heart rate, probably with Norvasc and/or hydralazine. Follow up glucose. Follow up shortness of breath.  I will advance physical therapy until the patient has reliable electrical activity to her heart with a pacemaker.  We will continue other current therapy.     ____________________________ Loran Senters. Clayborn Bigness, MD ddc:dp D: 05/26/2013 07:08:17 ET T: 05/26/2013 07:38:16 ET JOB#: [id number]  cc: Kendra Grissett D. Clayborn Bigness, MD, <Dictator> Yolonda Kida MD ELECTRONICALLY SIGNED 07/09/2013 11:42

## 2014-11-12 NOTE — Op Note (Signed)
PATIENT NAME:  Sue Knapp, Sue Knapp MR#:  [id number] DATE OF BIRTH:  February 22, 1923  DATE OF PROCEDURE:  05/26/2013  ATTENDING SURGEON:  Harrell Gave A. Thurma Priego, MD  PREOPERATIVE DIAGNOSIS: Left hemothorax.   POSTOPERATIVE DIAGNOSIS:  Left hemothorax.  PROCEDURE PERFORMED: Left tube thoracostomy, 28-French.   ESTIMATED BLOOD LOSS: 500 mL of old blood with tube placement.   ANESTHESIA: IV sedation and narcotic, 2 mg Versed, 50 mg fentanyl.   COMPLICATIONS: None.   INDICATIONS FOR PROCEDURE:  [name] is a pleasant 81 year old who presented with what looked like a fluid density in her left chest after placement of a pacemaker. It was felt this could be due to blood and, therefore, we were consulted for evacuation of hemothorax.   DETAILS OF PROCEDURE: Informed consent was obtained. Ms. Games was laid supine on her hospital bed. Her left chest was prepped and draped in the standard surgical fashion. A timeout was then performed correctly identifying the patient name, operative site and procedure to be performed. IV narcotics and sedation were administered. She did become profoundly hypotensive and hypoxic and, therefore, required bagging.   When the patient stabilized I infiltrated her subcu tissue at her anterior axillary line at the margin of her left nipple with 1% lidocaine. I then used an incision to cut down on her rib and to enter her pleural cavity with a sterile Kelly hemostat. There was a significant amount of blood which was returned.  I then placed the chest tube and there was a large amount of dark blood, approximately 500 mL, which immediately was evacuated. The tube was hooked up to suction. The tube was then sutured in place with a 0 silk U-stitch.   A sterile dressing was then placed over the wound.  The dressings were then taken down and an x-ray showed a chest tube with moderate improvement of left-sided opacity and in appropriate position. There were no immediate complications.  Needle, sponge and instrument counts were correct at the end the procedure.    ____________________________ Glena Norfolk. Oma Alpert, MD cal:cs D: 05/27/2013 13:17:27 ET T: 05/27/2013 14:06:13 ET JOB#: [id number]  cc: Harrell Gave A. Park Beck, MD, <Dictator> Floyde Parkins MD ELECTRONICALLY SIGNED 05/27/2013 15:21

## 2014-11-12 NOTE — Discharge Summary (Signed)
PATIENT NAME:  Sue Knapp, Sue Knapp MR#:  [id number] DATE OF BIRTH:  [birth year]  DATE OF ADMISSION:  05/23/2013 DATE OF DISCHARGE:  06/03/2013  DISCHARGE DIAGNOSES:  1.  Symptomatic bradycardia, post pacemaker placement.  2.  Post procedure complication of left hemothorax, requiring chest tube.  3.  Hypertension.  4.  Constipation.  5.  History of breast cancer.   DISCHARGE MEDICATIONS: Melatonin 1 tab at bedtime, pravastatin 20 mg at bedtime, latanoprost 0.05% 1 drop both eyes at bedtime, multivitamin daily, letrozole 2.5 mg daily,  Tylenol 650 mg q.4 hours p.r.n. pain, valsartan 160 mg b.i.d., amlodipine 5 mg b.i.d., ranitidine 150 mg b.i.d., MiraLax 15 grams daily, B12 1000 mcg daily, vitamin D3 1000 units daily and Ensure twice daily.   REASON FOR ADMISSION: An 81 year old female, who presented with symptomatic bradycardia, heart rate in the 30s. Please see H and P for HPI, past medical history and physical exam.   HOSPITAL COURSE: The patient was admitted, found to have profound bradycardia. She went to the ICU. Temporary pacer was placed. Dr. Lavera Guise, the subsequent day, performed pacemaker placement. She did have to go back to the OR one time for repositioning. Post procedure, she had profound left chest pain and chest x-ray showed fluid in the left lung. A followup CT was consistent with upper left hemothorax. Surgery was consulted and a chest tube was placed by Dr. Rexene Edison. The chest tube remained in place after draining 500 mL of blood. The chest tube was finally removed approximately 4 to 5 days later. There was no air leak. Subsequent chest x-rays showed no pneumothorax or residual. It was thought that the bleeding and hemothorax was due to post procedure plus the fact she was on aspirin and Plavix at the time, although the Plavix had been stopped a couple of days prior. Overall, the plan is to be off Plavix long-term and restart aspirin in 1 week. Her hemoglobin is stable post 2 units of  PRBCs. She was in bed long enough that, for the last couple of days, when she sits up she gets a little lightheaded and nauseated, but that improves after a few minutes and over time her autonomic dysfunction will improve. Overall prognosis is good. She will need her stitches out in 1 week.  ____________________________ Rusty Aus, MD mfm:aw D: 06/03/2013 08:12:31 ET T: 06/03/2013 08:22:58 ET JOB#: [id number]  cc: Rusty Aus, MD, <Dictator> MARK Roselee Culver MD ELECTRONICALLY SIGNED 06/04/2013 8:05

## 2014-11-12 NOTE — Discharge Summary (Signed)
PATIENT NAME:  Sue Knapp, RITACCO MR#:  [id number] DATE OF BIRTH:  04-05-1923  DATE OF ADMISSION:  04/10/2013 DATE OF DISCHARGE:  04/11/2013  DISCHARGE DIAGNOSES: 1.  Acute transient ischemic attack with transient left hand weakness.  2.  Osteoporosis.  3.  Hyperlipidemia.  4.  Hypertension.  5.  Multinodular goiter.   DISCHARGE MEDICATIONS: Plavix 75 mg daily, aspirin 81 mg daily, pravastatin 20 mg at bedtime, vitamin D3/B12 daily, letrozole 2.5 mg daily, HCTZ 25 mg at bedtime, Fosamax 70 mg every week.   REASON FOR ADMISSION: An 81 year old female who presents with transient left hand weakness. Please see H and P for history of present illness, past medical history, and physical exam.   HOSPITAL COURSE: The patient was admitted. Brain MRI normal. Her symptoms in her left hand resolved within 30 minutes after taking 2 aspirin. Work-up essentially negative. She is asymptomatic. Plavix will be added for a month. Pravachol added in light of her LDL of 131.   OVERALL PROGNOSIS: Guarded.   FOLLOWUP: She will follow-up with Dr. Sabra Heck this week    ____________________________ Rusty Aus, MD mfm:dp D: 04/11/2013 08:53:59 ET T: 04/11/2013 09:55:48 ET JOB#: [id number]  cc: Rusty Aus, MD, <Dictator> Melinda Gwinner Roselee Culver MD ELECTRONICALLY SIGNED 04/11/2013 11:16

## 2014-11-12 NOTE — H&P (Signed)
PATIENT NAME:  Knapp, Sue MR#:  [id number] DATE OF BIRTH:  1922/11/12  DATE OF ADMISSION:  04/10/2013  PRIMARY CARE PHYSICIAN: Emily Filbert, MD  CHIEF COMPLAINT: Left hand weakness today.   HISTORY OF PRESENT ILLNESS: Sue Knapp is a pleasant 81 year old Caucasian female with past medical history of osteoporosis, hypertension, multinodular goiter status post thyroidectomy many years ago along with hyperlipidemia who comes to the Emergency Room where complaints of left upper extremity weakness and numbness. The patient says  she got up this morning, was doing her usual routine, went to fix some coffee in the kitchen. The patient started feeling shaky and numbness in her left upper extremity. She could hardly put her cup down. Her symptoms resolved after she took 2 baby aspirin, enteric coated. She states she intermittently feels something happening on her left arm and came to the Emergency Room. She is currently asymptomatic and is going to be admitted for possible TIA. The patient does not have any past medical history of TIA in the past. She denies any dysarthria, any numbness or weakness in the lower extremity, any speech problems or any visual disturbances. She received another 2 baby aspirin, enteric-coated, in the Emergency Room. She has tolerated it well so far.  ALLERGIES: CODEINE AND ETODOLAC.  MEDICATIONS: 1.  Vitamin D3 p.o. daily.  2.  Vitamin B12 p.o. daily.  3.  Melatonin once a day at bedtime.  4.  Letrozole 2.5 mg daily.  5.  Hydrochlorothiazide 25 mg p.o. daily.  6.  Fosamax 70 mg once a week.  7.  Aspirin 81 mg enteric-coated daily.   PAST SURGICAL HISTORY: 1.  Thyroidectomy.  2.  Previous hysterectomy.  3.  Bladder surgery for benign disease.  4.  Partial mastectomy in 2013.   PAST MEDICAL HISTORY: 1.  Osteoporosis. 2.  Hypertension. 3.  Hyperlipidemia. 4.  Multinodular goiter status post thyroidectomy.  5.  Glaucoma.  6.  Stage I breast cancer status post  left-sided mastectomy, partial mastectomy, with radiation therapy.  7.  Left bundle branch block per EKG.   FAMILY HISTORY: Positive for hypertension. The patient is a resident at [place]. She denies any alcohol or tobacco abuse.   REVIEW OF SYSTEMS: CONSTITUTIONAL: No fever, fatigue, weakness.  EYES: No blurred or double vision, glaucoma or cataracts.  ENT: No tinnitus, ear pain, hearing loss.  RESPIRATORY: No cough, wheeze, hemoptysis, COPD. CARDIOVASCULAR: No chest pain, orthopnea, edema, arrhythmia or hypertension.  GASTROINTESTINAL: No nausea, vomiting, diarrhea, abdominal pain or hematemesis.  GENITOURINARY: No dysuria, hematuria, frequency. ENDOCRINE: No polyuria, nocturia or thyroid problems.  HEMATOLOGY: No anemia or easy bruising.  SKIN: No acne or rash.  MUSCULOSKELETAL: Positive for arthritis.  NEUROLOGIC: Positive for numbness in the left upper extremity. Positive for mild weakness. Negative for dysarthria, dementia and CVA.  PSYCHIATRIC: No anxiety or depression or bipolar disorder. All other systems reviewed and negative.   PHYSICAL EXAMINATION: GENERAL: The patient is awake, alert and oriented x 3, not in acute distress.  VITAL SIGNS: Afebrile, pulse 64, blood pressure 182/69 and sats are 95% on room air.  HEENT: Atraumatic, normocephalic. Pupils are equal, round and reactive to light and accommodation. EOM intact. Oral mucosa is moist.  NECK: Supple. No JVD. No carotid bruit.  LUNGS: Clear to auscultation bilaterally. No rales, rhonchi, respiratory distress or labored breathing.  HEART: Both the heart sounds are normal. Rate, rhythm regular. PMI not lateralized. Chest is nontender. Good pedal pulses. Good femoral pulses. No lower extremity edema.  ABDOMEN: Soft, benign, nontender. No organomegaly. Positive bowel sounds.  NEUROLOGIC: The patient is right-handed. Cranial nerves III through XII are intact. No facial droop and dysarthria noted. No nystagmus.  Motor: Upper and lower extremity strength 4+/5. Sensory: Within normal limits, in both upper and lower extremities. Reflexes: Plantars downgoing, which is flexor.  Deep tendon jerks: 2+ in both upper and lower extremities. Gait deferred.  SKIN: Warm and dry.  PSYCHIATRIC: The patient is awake, alert and oriented x 3.   LABORATORY AND DIAGNOSTICS:  UA negative for UTI.  CT head is negative for any acute CVA.   CBC within normal limits. Comprehensive metabolic panel within normal limits. Troponin is 0.06.   EKG shows normal sinus rhythm, left axis deviation and left bundle branch block, which is chronic from previous EKG.   ASSESSMENT: 81 year old Sue Knapp with history of osteoporosis, hypertension and hyperlipidemia who comes in with:  1.  Suspected transient ischemic attack. The patient came in with symptoms of left upper extremity numbness which comes and goes. She will be kept on telemetry floor for overnight observation. Will continue to monitor her on telemetry. Will give her enteric-coated aspirin 81 mg. The patient is able to tolerate it well. Neuro checks q. 4 hours. The patient's carotid Dopplers were done at [facility] as an outpatient on 02/06/2013 and they have been essentially negative without any significant stenosis. She has atherosclerosis bilaterally. Will order MRI of the brain.  2.  Hypertension. Continue hydrochlorothiazide. 3.  Osteoporosis. The patient is on calcium with vitamin D.  4.  History of multinodular goiter status post thyroidectomy.  5.  Deep venous thrombosis prophylaxis. Will give her sub-Q heparin.  6.  History of breast cancer. The patient is on letrozole which will be resumed.   Further work-up according to the patient's clinical course. Hospital admission plan was discussed with the patient and family. The patient is a FULL CODE.  TIME SPENT: 45 minutes.   ____________________________ Hart Rochester Posey Pronto, MD sap:sb D: 04/10/2013 14:03:55  ET T: 04/10/2013 14:23:43 ET JOB#: [id number]  cc: Driana Dazey A. Posey Pronto, MD, <Dictator> Rusty Aus, MD Ilda Basset MD ELECTRONICALLY SIGNED 04/10/2013 15:03

## 2014-11-12 NOTE — Consult Note (Signed)
PATIENT NAME:  Sue Knapp, Sue Knapp MR#:  [id number] DATE OF BIRTH:  April 03, 1923  DATE OF CONSULTATION:  05/26/2013  REFERRING PHYSICIAN:   CONSULTING PHYSICIAN:  Maryln Eastham A. Marrio Scribner, MD  REASON FOR CONSULTATION: Left hemothorax after pacemaker placement.   HISTORY OF PRESENT ILLNESS: Sue Knapp is a pleasant 81 year old female who was admitted with near syncope with bradycardia. She underwent unremarkable pacemaker placement which did require return to the Operating Room. Today, she was noted to have left lung complaint of pleurisy as well as a CT scan which showed a large left pneumothorax. I was thus consulted for chest tube placement. Otherwise, Sue Knapp, although tearful was doing relatively well with no fevers, chills, night sweats, cough, abdominal pain, nausea, vomiting, diarrhea, constipation, dysuria or hematuria.   PAST MEDICAL HISTORY: 1.  Bradycardia, status post pacemaker placement.  2.  History of breast cancer, status post lumpectomy.  3.  Peripheral neuropathy.  4.  Hiatal hernia.  5.  Hyperlipidemia.   6.  Benign hypertension.  7.  Osteoarthritis.  8.  Osteoporosis.  9.  Bilateral hearing loss.  10.  Glaucoma.  11.  History of partial thyroidectomy.  12.  History of hysterectomy.  13.  History of bladder surgery.   HOME MEDICATIONS:  As follows: 1.  Fosamax.  2.  Plavix.  3.  Aspirin.  4.  Diovan.  5.  Pravastatin.  6.  Letrozole.  7.  Hydrochlorothiazide.   ALLERGIES: CODEINE, LODINE.   SOCIAL HISTORY: Denies tobacco or alcohol use.   FAMILY HISTORY: Coronary artery disease, diabetes, stroke, breast cancer.   REVIEW OF SYSTEMS: A 12-point review of systems was obtained. Pertinent positives and negatives as above.   PHYSICAL EXAMINATION: VITAL SIGNS: Temperature 98.9, pulse 74, blood pressure 116/49, respirations 30, 86% on 1 liter.  GENERAL: No acute distress, alert and oriented x 3.  HEENT:  Head normocephalic, atraumatic. Eyes no scleral icterus,  no conjunctivitis. Face no obvious facial trauma. Normal external nose. Normal external ears.  CHEST: Lungs clear to auscultation, decreased breath sounds on the left.  HEART: Regular rate and rhythm. No murmurs, rubs or gallops.  ABDOMEN: Soft, nontender, nondistended.  EXTREMITIES: Moves all extremities well. Strength 5 out of 5. NEUROLOGICAL EXAMINATION:  Cranial nerves II through XII grossly intact. Sensation intact in all 4 extremities.   LABORATORY AND RADIOLOGICAL DATA:  1.  Hemoglobin 8.8, hematocrit 26.0, platelets are 200.  2.  BMP is normal.  3.  CT shows a large left effusion with density within effusion consistent with hemothorax.   ASSESSMENT AND PLAN: Sue Knapp is a pleasant 81 year old with postprocedure hemothorax. She has undergone a tube thoracostomy which is described elsewhere. We will continue the tube on suction and follow up with x-ray in the morning. Tube appears to be in appropriate position and I have gotten approximately 500 mL of loculated blood or nonloculated old hematoma. Will consult Dr. Genevive Bi for further evaluation of need for possible further intervention if hemothorax is not resolved completely.    ____________________________ Glena Norfolk Nevin Kozuch, MD cal:cs D: 05/26/2013 19:12:20 ET T: 05/26/2013 19:27:07 ET JOB#: [id number]  cc: Harrell Gave A. Deashia Soule, MD, <Dictator> Floyde Parkins MD ELECTRONICALLY SIGNED 05/27/2013 15:20

## 2014-11-12 NOTE — Consult Note (Signed)
Chief Complaint:  Subjective/Chief Complaint Pt c/o of jumpy leg. She denies cp or palps.   VITAL SIGNS/ANCILLARY NOTES: **Vital Signs.:   02-Nov-14 20:04  Vital Signs Type Routine  Pulse Pulse 42  Pulse source if not from Vital Sign Device per cardiac monitor  Respirations Respirations 34  Systolic BP Systolic BP 092  Diastolic BP (mmHg) Diastolic BP (mmHg) 49  Mean BP 78  Pulse Ox % Pulse Ox % 96  Pulse Ox Activity Level  At rest  Oxygen Delivery Room Air/ 21 %  Pulse Ox Heart Rate 40  *Intake and Output.:   02-Nov-14 21:00  Grand Totals Intake:  50 Output:      Net:  31 24 Hr.:  330  IV (Primary)      In:  50   Brief Assessment:  GEN well developed, well nourished, no acute distress   Cardiac Regular  murmur present  -- LE edema  -- JVD  brady   Respiratory normal resp effort  clear BS   Gastrointestinal Normal   Gastrointestinal details normal Soft  Nontender  Nondistended   EXTR negative cyanosis/clubbing   Lab Results: LabObservation:  02-Nov-14 06:32   OBSERVATION Reason for Test  Lab:  02-Nov-14 04:00   O2 Saturation (Pulse Ox) 93  FiO2 (Pulse Ox) ra (Result(s) reported on 24 May 2013 at 04:59AM.)  Cardiology:  02-Nov-14 06:32   Echo Doppler REASON FOR EXAM:     COMMENTS:     PROCEDURE: [facility] - ECHO DOPPLER COMPLETE(TRANSTHOR)  - May 24 2013  6:32AM   RESULT: Echocardiogram Report  Patient Name:   Sue Knapp Date of Exam: 05/24/2013 Medical Rec #:  [id number]            Custom1: Date of Birth:  [birth year]         Height:       60.0 in Patient Age:    81 years          Weight:       143.0 lb Patient Gender: F                 BSA:          1.62 m??  Indications: SOB Sonographer:    LTM Referring Phys: Graciella Freer, H  Summary:  1. Left ventricular ejection fraction, by visual estimation, is 60 to  65%.  2. Normal global left ventricular systolic function.  3. Mild aortic regurgitation.  4. Mild aortic valve sclerosis without stenosis.   5. Mildly increased left ventricular posterior wall thickness.  6. Mild tricuspid regurgitation. 2D AND M-MODE MEASUREMENTS (normal ranges within parentheses): Left Ventricle:          Normal   AoV Cusp Separation: 2.20 cm (1.5-2.6) IVSd (2D):      1.26 cm (0.7-1.1) LVPWd (2D):     1.27 cm (0.7-1.1) Aorta/LA:                  Normal LVIDd (2D):     4.07 cm (3.4-5.7) Aortic Root (2D): 3.10 cm (2.4-3.7) LVIDs (2D):     2.64 cm           AoV Cusp Exc:     2.20 cm (1.5-2.6) LV FS (2D):     35.1 %   (>25%)   Left Atrium (2D): 3.60 cm (1.9-4.0) LV EF (2D):     65.0 %   (>50%)  Right Ventricle:                                   RVd (2D): LV DIASTOLIC FUNCTION: MV Peak E: 1.46 m/s Decel Time: 158 msec MV Peak A: 1.01 m/s E/ARatio: 1.45 SPECTRAL DOPPLER ANALYSIS (where applicable): Mitral Valve: MV P1/2 Time: 45.82 msec MV Area, PHT: 4.80 cm?? Aortic Valve: AoV Max Vel: 1.54 m/s AoV Peak PG: 9.5 mmHg AoV Mean PG:   5.0 mmHg LVOT Vmax: 1.21 m/s LVOT VTI:  LVOT Diameter: 1.70 cm AoV Area, Vmax: 1.78 cm?? AoV Area, VTI:  AoV Area, Vmn: Aortic Insufficiency: AI Half-time:  697 msec AI Decel Rate: 1.84 m/s?? Tricuspid Valve and PA/RV Systolic Pressure: TR Max Velocity: 2.66 m/s RA  Pressure: 10 mmHg RVSP/PASP: 38.4 mmHg Pulmonic Valve: PV Max Velocity: 0.96 m/s PV Max PG: 3.7 mmHg PV Mean PG: Pulmonic Insufficiency: PI EDV: 0.56 m/s PADP: 11.2 mmHg  PHYSICIAN INTERPRETATION: Left Ventricle: The left ventricular internal cavity size was normal. LV   septal wall thickness was normal. LV posterior wall thickness was mildly  increased. Global LV systolic function was normal. Left ventricular  ejection fraction, by visual estimation, is 60 to 65%. Right Ventricle: The right ventricular size is normal. Global RV systolic  function is normal. Left Atrium: The left atrium is normal in size. Right Atrium: The right atrium is normal in size. Pericardium: There  is no evidence of pericardial effusion. Mitral Valve: The mitral valve is normal instructure. Tricuspid Valve: The tricuspid valve is normal. Mild tricuspid  regurgitation is visualized. The tricuspid regurgitant velocity is 2.66  m/s, and with an assumed right atrial pressure of 10 mmHg, the estimated  right ventricular systolic pressure is normal at 38.4 mmHg. Aortic Valve: The aortic valve is normal. Mild aortic valve sclerosis is  present, with no evidence of aortic valve stenosis. Mild aortic valve   regurgitation is seen. Pulmonic Valve: The pulmonic valve is normal.  [place] MD Electronically signed by [place] MD Signature Date/Time: 05/24/2013/3:18:21 PM  *** Final ***  IMPRESSION: .    Verified By: Yolonda Kida, M.D., MD  Routine Chem:  02-Nov-14 07:05   Glucose, Serum 94  BUN  22  Creatinine (comp) 0.95  Sodium, Serum 139  Potassium, Serum 3.7  Chloride, Serum  108  CO2, Serum 23  Calcium (Total), Serum 8.9  Osmolality (calc) 281  eGFR (African American) >60  eGFR (Non-African American)  53 (eGFR values <38mL/min/1.73 m2 may be an indication of chronic kidney disease (CKD). Calculated eGFR is useful in patients with stable renal function. The eGFR calculation will not be reliable in acutely ill patients when serum creatinine is changing rapidly. It is not useful in  patients on dialysis. The eGFR calculation may not be applicable to patients at the low and high extremes of body sizes, pregnant women, and vegetarians.)  Anion Gap 8  Result Comment TROPONIN - RESULTS VERIFIED BY REPEAT TESTING.  - RESULT PREVIOUSLY CALLED 05/23/13 AT  - 3716 BY [facility]  Result(s) reported on 24 May 2013 at 08:23AM.  Cardiac:  02-Nov-14 07:05   Troponin I  0.08 (0.00-0.05 0.05 ng/mL or less: NEGATIVE  Repeat testing in 3-6 hrs  if clinically indicated. >0.05 ng/mL: POTENTIAL  MYOCARDIAL INJURY. Repeat  testing in 3-6 hrs if  clinically  indicated. NOTE: An increase or decrease  of 30% or more on serial  testing  suggests a  clinically important change)  CK, Total 61  CPK-MB, Serum 1.1 (Result(s) reported on 24 May 2013 at [facility].)  Routine Hem:  02-Nov-14 07:05   Hematocrit (CBC)  31.2  Hemoglobin (CBC)  10.8  WBC (CBC) 6.5  RBC (CBC)  3.60  Platelet Count (CBC) 249  MCV 87  MCH 30.2  MCHC 34.8  RDW  15.3  Neutrophil % 74.8  Lymphocyte % 14.2  Monocyte % 8.8  Eosinophil % 1.5  Basophil % 0.7  Neutrophil # 4.9  Lymphocyte #  0.9  Monocyte # 0.6  Eosinophil # 0.1  Basophil # 0.0 (Result(s) reported on 24 May 2013 at 07:45AM.)   Radiology Results: XRay:    01-Nov-14 14:48, Chest Portable Single View  Chest Portable Single View   REASON FOR EXAM:    sob  COMMENTS:       PROCEDURE: DXR - DXR PORTABLE CHEST SINGLE VIEW  - May 23 2013  2:48PM     RESULT: Cardiac monitoring electrodes are present. The projection is   extremely lordotic. Atherosclerotic disease is present. The lungs appear   to be clear. The bony and mediastinal structures are unremarkable.    IMPRESSION:   1. No acute cardiopulmonary disease evident.    Dictation Site: 1      Verified By: Sundra Aland, M.D., MD  Cardiology:    01-Nov-14 14:07, ED ECG  Ventricular Rate 45  Atrial Rate 77  QRS Duration 144  QT 520  QTc 449  P Axis 56  R Axis -62  T Axis 104  ECG interpretation   Sinus rhythm with 2nd degree A-V block (Mobitz I) with 2:1 A-V conduction  Left axis deviation  Left bundle branch block  Abnormal ECG  When compared with ECG of 10-Apr-2013 09:46,  Sinus rhythm is now with 2nd degree A-V block (Mobitz I)  Vent. ratehas decreased BY  25 BPM  T wave inversion less evident in Lateral leads  ----------unconfirmed----------  Confirmed by OVERREAD, NOT ([id number]), editor PEARSON, BARBARA ([id number]) on 05/25/2013 10:45:37 AM  ED ECG     02-Nov-14 06:32, Echo Doppler  Echo Doppler   REASON FOR EXAM:      COMMENTS:        PROCEDURE: [facility] - ECHO DOPPLER COMPLETE(TRANSTHOR)  - May 24 2013  6:32AM     RESULT: Echocardiogram Report    Patient Name:   Sue Knapp Date of Exam: 05/24/2013  Medical Rec #:  [id number]            Custom1:  Date of Birth:  05-10-1923         Height:       60.0 in  Patient Age:    11 years          Weight:       143.0 lb  Patient Gender: F                 BSA:          1.62 m??    Indications: SOB  Sonographer:    LTM  Referring Phys: Graciella Freer, H    Summary:   1. Left ventricular ejection fraction, by visual estimation, is 60 to   65%.   2. Normal global left ventricular systolic function.   3. Mild aortic regurgitation.   4. Mild aortic valve sclerosis without stenosis.   5. Mildly increased left ventricular posterior wall thickness.   6. Mild tricuspid regurgitation.  2D  AND M-MODE MEASUREMENTS (normal ranges within parentheses):  Left Ventricle:          Normal   AoV Cusp Separation: 2.20 cm (1.5-2.6)  IVSd (2D):      1.26 cm (0.7-1.1)  LVPWd (2D):     1.27 cm (0.7-1.1) Aorta/LA:                  Normal  LVIDd (2D):     4.07 cm (3.4-5.7) Aortic Root (2D): 3.10 cm (2.4-3.7)  LVIDs (2D):     2.64 cm           AoV Cusp Exc:     2.20 cm (1.5-2.6)  LV FS (2D):     35.1 %   (>25%)   Left Atrium (2D): 3.60 cm (1.9-4.0)  LV EF (2D):     65.0 %   (>50%)                                      Right Ventricle:                                    RVd (2D):  LV DIASTOLIC FUNCTION:  MV Peak E: 1.46 m/s Decel Time: 158 msec  MV Peak A: 1.01 m/s  E/ARatio: 1.45  SPECTRAL DOPPLER ANALYSIS (where applicable):  Mitral Valve:  MV P1/2 Time: 45.82 msec  MV Area, PHT: 4.80 cm??  Aortic Valve: AoV Max Vel: 1.54 m/s AoV Peak PG: 9.5 mmHg AoV Mean PG:     5.0 mmHg  LVOT Vmax: 1.21 m/s LVOT VTI:  LVOT Diameter: 1.70 cm  AoV Area, Vmax: 1.78 cm?? AoV Area, VTI:  AoV Area, Vmn:  Aortic Insufficiency:  AI Half-time:  697 msec  AI Decel Rate: 1.84 m/s??  Tricuspid Valve and PA/RV  Systolic Pressure: TR Max Velocity: 2.66 m/s RA   Pressure: 10 mmHg RVSP/PASP: 38.4 mmHg  Pulmonic Valve:  PV Max Velocity: 0.96 m/s PV Max PG: 3.7 mmHg PV Mean PG:  Pulmonic Insufficiency:  PI EDV: 0.56 m/s PADP: 11.2 mmHg    PHYSICIAN INTERPRETATION:  Left Ventricle: The left ventricular internal cavity size was normal. LV     septal wall thickness was normal. LV posterior wall thickness was mildly   increased. Global LV systolic function was normal. Left ventricular   ejection fraction, by visual estimation, is 60 to 65%.  Right Ventricle: The right ventricular size is normal. Global RV systolic   function is normal.  Left Atrium: The left atrium is normal in size.  Right Atrium: The right atrium is normal in size.  Pericardium: There is no evidence of pericardial effusion.  Mitral Valve: The mitral valve is normal instructure.  Tricuspid Valve: The tricuspid valve is normal. Mild tricuspid   regurgitation is visualized. The tricuspid regurgitant velocity is 2.66   m/s, and with an assumed right atrial pressure of 10 mmHg, the estimated   right ventricular systolic pressure is normal at 38.4 mmHg.  Aortic Valve: The aortic valve is normal. Mild aortic valve sclerosis is   present, with no evidence of aortic valve stenosis. Mild aortic valve     regurgitation is seen.  Pulmonic Valve: The pulmonic valve is normal.    1105 Lujean Amel MD  Electronically signed by [place] MD  Signature Date/Time: 05/24/2013/3:18:21 PM    ***  Final ***    IMPRESSION: .        Verified By: Yolonda Kida, M.D., MD   Assessment/Plan:  Invasive Device Daily Assessment of Necessity:  Does the patient currently have any of the following indwelling devices? foley   Assessment/Plan:  Assessment IMP Heart block Loletha Grayer Near syncope Mild obesity DJD Hx breast Ca   Plan PLAN ICU Pacer pads DVT prophylaxis ECHO PPM for Mobitz type 2 Treat RLS? Consider pulmonary  input   Electronic Signatures: Yolonda Kida (MD)  (Signed [phone] 21:24)  Authored: Chief Complaint, VITAL SIGNS/ANCILLARY NOTES, Brief Assessment, Lab Results, Radiology Results, Assessment/Plan   Last Updated: 04-Nov-14 21:24 by Lujean Amel D (MD)

## 2014-11-12 NOTE — Consult Note (Signed)
PATIENT NAME:  Sue Knapp, Sue Knapp MR#:  [id number] DATE OF BIRTH:  1923/01/08  DATE OF CONSULTATION:  05/27/2013  REFERRING PHYSICIAN: Dr. Patricia Pesa  CONSULTING PHYSICIAN:  Lew Dawes. Genevive Bi, MD  REASON FOR CONSULTATION: Management of left-sided hemothorax.   I have personally seen and examined Sue Knapp. I discussed her care with Dr. Mortimer Fries as well as Dr. Chauncey [place]. In addition, I discussed her care with Dr. Clayborn Bigness in cardiology. This is a very pleasant 81 year old woman, who came to the Emergency Room with complaints of shortness of breath, weakness and near syncope. When in the Emergency Department, she was found to be profoundly bradycardic. She was thought to be in a high grade AV block and she was taken to the operating room where a permanent dual-chamber pacemaker was placed. The patient was returned to the operating room several hours later for repositioning of the ventricular lead. The patient tolerated that procedure, but a postoperative chest x-ray showed that there was a large left-sided pleural effusion. The CT scan performed to evaluate this again confirmed the presence of a large pleural effusion. A chest tube was inserted by Dr. Rexene Edison last evening, which drained approximately 600 to 700 mL of old blood. The chest x-ray immediately improved. Overnight, there has been only minimal drainage from the chest tube. There is no air leak. Her chest x-ray today shows continued improvement in the appearance of the left hemithorax without any further significant chest drainage.   The patient is complaining of pain at the chest tube insertion site as well as at her pacemaker site. She has not complained of any shortness of breath at this time.   PAST MEDICAL HISTORY: Significant for breast cancer, status post lumpectomy and radiation therapy on the left. She has a history of peripheral neuropathy, hiatal hernia, hyperlipidemia, hypertension, osteoporosis and osteoarthritis. She has  also undergone a partial thyroidectomy, hysterectomy and bladder surgery.   SOCIAL HISTORY:  Her daughter states that she does not smoke or drink at this time. Marland Kitchen   FAMILY HISTORY: Positive for coronary artery disease, diabetes, stroke and breast cancer.   REVIEW OF SYSTEMS: As per history of present illness and all others were negative.   PHYSICAL EXAMINATION:  GENERAL: Revealed an elderly woman, who appeared younger than her stated age. She was able to speak in complete sentences. Occasionally she appeared somewhat confused.  HEENT: Head normocephalic. The pupils were equal.  NECK: Supple without thyromegaly or adenopathy. There were no palpable masses.  HEART: Regular. She did have a systolic murmur present. There was a dressing over the chest tube site and over the pacemaker site, which were clean, dry and intact.  LUNGS: Equal bilaterally.  ABDOMEN: Soft and nontender. There were no palpable masses. She had good pulses throughout.   I have independently reviewed the patient's x-rays and CT scans. I have reviewed the options with the family regarding management of her chest tube at this time. I believe that we should leave this chest tube to suction at least for another 24 to 48 hours. At that point, we can consider removing it if the drainage is less than 100 mL per day.   Thank you very much for allowing me to participate in her care. I will continue to follow her with you throughout her hospitalization.  ____________________________ Lew Dawes Genevive Bi, MD teo:aw D: 05/27/2013 14:18:48 ET T: 05/27/2013 14:35:35 ET JOB#: [id number]  cc: Christia [place] E. Genevive Bi, MD, <Dictator> Louis Matte MD ELECTRONICALLY SIGNED 06/22/2013 5:22

## 2014-11-12 NOTE — Op Note (Signed)
PATIENT NAME:  Sue Knapp, Sue Knapp MR#:  [id number] DATE OF BIRTH:  10-14-1922  DATE OF PROCEDURE:  05/25/2013  PROCEDURE: Insertion of a permanent pacemaker.   PREOPERATIVE DIAGNOSIS: High-grade atrioventricular AV block with near syncope.   PREOPERATIVE DIAGNOSIS: High-grade atrioventricular block with syncope.   IMPLANTING PHYSICIAN: Cletis Athens, MD  ATTENDING CARDIOLOGIST: Lujean Amel, MD   ATTENDING PHYSICIAN: Emily Filbert, MD   PROCEDURE NOTE: The patient was taken to the operating room and the left upper chest was prepared with Hibiclens and it was draped in a sterile fashion. Next, 1% Xylocaine was infiltrated under the skin and subcutaneous tissue and then an incision was made on the skin overlying the deltopectoral groove. Incision was carried down to the superficial fascia. Deltopectoral groove was dissected out. The cephalic vein was not isolated so the patient was put in Trendelenburg position and left subclavian stick was made on the first pass and ventricular lead was introduced through the port. Optimum position was obtained, at the floor of the right ventricle. Stimulation thresholds were measured several times. The lead condition was satisfactory so it was tied to the pectoralis major muscle. Next, another subclavian stick was made on the left side and the subclavian vein was entered. The position was confirmed by radiography and with the help of introducer kit atrial lead was introduced and it was passed through the left subclavian vein and superior vena cava and passed to the tricuspid valve into the inferior vena cava and then brought it up to find an optimum position at the right atrial appendage area. Stimulation threshold was found to be satisfactory. So the atrial lead was also tied to the pectoralis major muscle on the top of the suture sleeve and both leads were attached to the pacemaker. The pacemaker cavity was also fashioned under the superficial fascia. All bleeding  points were cauterized and subcutaneous tissues and skin were sutured separately. The patient was sent to the recovery room in a stable condition.   ACCESSORY CLINICAL DATA: Pacemaker is made by [employer]. The pacemaker serial number is [id number]. It is an Set designer. [employer] atrial lead is 5592, 45 cm, serial number [id number]. [employer] ventricular lead is 5092, 52 cm, serial number [id number]. Threshold setting: Atrial lead 1.4 volts, 1.4 mA. Ventricular lead 1.8 mA. Threshold atrial lead 0.6. Threshold on the ventricular lead 1.1. Resistance on the atrial lead 445 and ventricular lead 702. R wave on the ventricular lead 10.1 and the atrial lead 5.4.   Chest x-ray is being obtained. The family was appraised of the progress of the patient. ____________________________ Cletis Athens, MD jm:sb D: 05/25/2013 14:17:32 ET T: 05/25/2013 15:11:16 ET JOB#: [id number]  cc: Cletis Athens, MD, <Dictator> Dwayne D. Clayborn Bigness, MD Rusty Aus, MD  Cletis Athens MD ELECTRONICALLY SIGNED 06/29/2013 17:22

## 2014-11-12 NOTE — Consult Note (Signed)
Brief Consult Note: Diagnosis: Bradycardia/Heart block.   Patient was seen by consultant.   Consult note dictated.   Recommend further assessment or treatment.   Orders entered.   Discussed with Attending MD.   Comments: IMP Brady Heart block Malignant HTN Hyperchol RLS? Marland Kitchen PLAN ICU Pacer wires Bp control with Amilodapine/Hydralazine Continue chol control PPM Monday.  Electronic Signatures: Lujean Amel D (MD)  (Signed [phone] 09:35)  Authored: Brief Consult Note   Last Updated: 02-Nov-14 09:35 by Yolonda Kida (MD)

## 2015-01-25 ENCOUNTER — Inpatient Hospital Stay: Payer: Medicare Other | Attending: Oncology | Admitting: Oncology

## 2015-01-25 ENCOUNTER — Other Ambulatory Visit: Payer: Self-pay | Admitting: Rheumatology

## 2015-01-25 ENCOUNTER — Encounter: Payer: Self-pay | Admitting: Oncology

## 2015-01-25 VITALS — BP 167/81 | HR 81 | Temp 97.2°F | Resp 16 | Wt 138.9 lb

## 2015-01-25 DIAGNOSIS — C50912 Malignant neoplasm of unspecified site of left female breast: Secondary | ICD-10-CM

## 2015-01-25 DIAGNOSIS — Z79899 Other long term (current) drug therapy: Secondary | ICD-10-CM | POA: Diagnosis not present

## 2015-01-25 DIAGNOSIS — Z79811 Long term (current) use of aromatase inhibitors: Secondary | ICD-10-CM | POA: Diagnosis not present

## 2015-01-25 DIAGNOSIS — Z170 Estrogen receptor positive status [ER+]: Secondary | ICD-10-CM | POA: Diagnosis not present

## 2015-01-25 DIAGNOSIS — M810 Age-related osteoporosis without current pathological fracture: Secondary | ICD-10-CM | POA: Insufficient documentation

## 2015-01-25 DIAGNOSIS — R270 Ataxia, unspecified: Secondary | ICD-10-CM

## 2015-01-25 NOTE — Progress Notes (Signed)
Patient has been having a left breast sensation that she describes as the same feeling when she needed to breast feed or during ovulation.  This sensation is not constant but does occur a couple times a week.

## 2015-01-27 ENCOUNTER — Ambulatory Visit
Admission: RE | Admit: 2015-01-27 | Discharge: 2015-01-27 | Disposition: A | Discharge status: Home / Self Care | Payer: Medicare Other | Source: Ambulatory Visit | Attending: Rheumatology | Admitting: Rheumatology

## 2015-01-27 DIAGNOSIS — Z950 Presence of cardiac pacemaker: Secondary | ICD-10-CM | POA: Diagnosis not present

## 2015-01-27 DIAGNOSIS — R270 Ataxia, unspecified: Secondary | ICD-10-CM | POA: Insufficient documentation

## 2015-01-27 DIAGNOSIS — I739 Peripheral vascular disease, unspecified: Secondary | ICD-10-CM | POA: Diagnosis not present

## 2015-01-27 DIAGNOSIS — Z853 Personal history of malignant neoplasm of breast: Secondary | ICD-10-CM | POA: Insufficient documentation

## 2015-02-02 DIAGNOSIS — C50112 Malignant neoplasm of central portion of left female breast: Secondary | ICD-10-CM | POA: Insufficient documentation

## 2015-02-02 NOTE — Progress Notes (Signed)
[place]  [phone] [phone]  ID: Sue Knapp OB: September 13, 1922  MR#: [id number]  RSW#:[id number]  Patient Care Team: Rusty Aus, MD as PCP - General (Internal Medicine)  CHIEF COMPLAINT:  Chief Complaint  Patient presents with  . Follow-up    breast cancer    INTERVAL HISTORY: Patient returns to clinic today for further evaluation and routine 6 monthup.  She continues to have multiple medical complaints that are chronic and unchanged.  She is tolerating letrozole and Fosamax well without significant side effects.  She has increased fatigue, but otherwise feels well.  She denies any pain.  She has no neurologic complaints.  She has a good appetite and denies weight loss.  She has no chest pain or shortness of breath.  She denies any nausea, vomiting, constipation, or diarrhea.  She has no urinary complaints.  Patient offers no further specific complaints today.   REVIEW OF SYSTEMS:   Review of Systems  Constitutional: Positive for malaise/fatigue.  Respiratory: Positive for shortness of breath.   Cardiovascular: Negative.   Musculoskeletal: Positive for joint pain.  Neurological: Positive for weakness.    As per HPI. Otherwise, a complete review of systems is negatve.  PAST MEDICAL HISTORY: Past Medical History  Diagnosis Date  . Hypertension   . Cataract   . Hyperlipidemia   . Glaucoma   . Osteoporosis   . Hypothyroidism   . Breast cancer   . TIA (transient ischemic attack)   . Left bundle branch block   . Hiatal hernia   . Thyroid nodule   . Pacemaker     PAST SURGICAL HISTORY: Past Surgical History  Procedure Laterality Date  . Thyroidectomy, partial    . Abdominal hysterectomy    . Mastectomy partial / lumpectomy      FAMILY HISTORY No family history on file.     ADVANCED DIRECTIVES:    HEALTH MAINTENANCE: History  Substance Use Topics  . Smoking status: Never Smoker   . Smokeless tobacco: Never  Used  . Alcohol Use: No     Colonoscopy:  PAP:  Bone density:  Lipid panel:  Allergies  Allergen Reactions  . Alendronate Sodium Nausea Only  . Codeine Other (See Comments)  . Etodolac Other (See Comments)  . Zolpidem Other (See Comments)    Current Outpatient Prescriptions  Medication Sig Dispense Refill  . alendronate (FOSAMAX) 70 MG tablet     . aspirin EC 81 MG tablet Take by mouth.    . Calcium Carbonate-Vitamin D 600-400 MG-UNIT per tablet Take by mouth.    . letrozole (FEMARA) 2.5 MG tablet     . LORazepam (ATIVAN) 0.5 MG tablet Take by mouth.    . LUMIGAN 0.01 % SOLN     . Melatonin (MELATONIN MAXIMUM STRENGTH) 5 MG TABS Take by mouth.    . Multiple Vitamin (MULTI-VITAMINS) TABS Take by mouth.    . pravastatin (PRAVACHOL) 20 MG tablet     . ranitidine (ZANTAC) 150 MG tablet Take by mouth.    . valsartan (DIOVAN) 160 MG tablet      No current facility-administered medications for this visit.    OBJECTIVE: Filed Vitals:   01/25/15 1454  BP: 167/81  Pulse: 81  Temp: 97.2 F (36.2 C)  Resp: 16     There is no height on file to calculate BMI.    ECOG FS:1 - Symptomatic but completely ambulatory  General: Well-developed, well-nourished, no acute distress. Eyes: anicteric sclera. Breasts:  Bilateral breasts and axilla without lumps or masses. Lungs: Clear to auscultation bilaterally. Heart: Regular rate and rhythm. No rubs, murmurs, or gallops. Abdomen: Soft, nontender, nondistended. No organomegaly noted, normoactive bowel sounds. Musculoskeletal: No edema, cyanosis, or clubbing. Neuro: Alert, answering all questions appropriately. Cranial nerves grossly intact. Skin: No rashes or petechiae noted. Psych: Normal affect.   LAB RESULTS:  Lab Results  Component Value Date   NA 138 06/09/2013   K 3.9 06/09/2013   CL 105 06/09/2013   CO2 28 06/09/2013   GLUCOSE 135* 06/09/2013   BUN 23* 06/09/2013   CREATININE 0.99 06/09/2013   CALCIUM 9.0 06/09/2013    PROT 5.5* 05/28/2013   ALBUMIN 2.6* 05/28/2013   AST 30 05/28/2013   ALT 13 05/28/2013   ALKPHOS 67 05/28/2013   GFRNONAA 51* 06/09/2013   GFRAA 59* 06/09/2013    Lab Results  Component Value Date   WBC 7.4 06/09/2013   NEUTROABS 5.8 06/09/2013   HGB 11.4* 06/09/2013   HCT 33.7* 06/09/2013   MCV 87 06/09/2013   PLT 406 06/09/2013     STUDIES: Ct Head Wo Contrast  01/27/2015   CLINICAL DATA:  81 year old female with balance problems worsening over the past few years. History of breast cancer. Pacemaker. Subsequent encounter.  EXAM: CT HEAD WITHOUT CONTRAST  TECHNIQUE: Contiguous axial images were obtained from the base of the skull through the vertex without intravenous contrast.  COMPARISON:  04/10/2013 CT and MR.  FINDINGS: No intracranial hemorrhage.  Mild small vessel disease type changes without CT evidence large acute infarct.  No hydrocephalus.  Vascular calcifications.  No intracranial mass lesion noted on this unenhanced exam.  No calvarial abnormality.  IMPRESSION: No intracranial hemorrhage.  Mild small vessel disease type changes without CT evidence large acute infarct.  No hydrocephalus.  Vascular calcifications.   Electronically Signed   By: Genia Del M.D.   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  Breast cancer: Given patient's advanced age and low stage of disease, she did not require adjuvant chemotherapy.  Continue letrozole daily for 5 years completing in November 2018.  Return to clinic in 6 months for routine evaluation.  Her most recent mammogram on January 06, 2014 was reported as BI-RADS 2, repeat in the next several weeks.   2.  Osteoporosis: Continue Fosamax, calcium and vitamin D supplementation.  Repeat bone mineral density on August 23, 2014 reported a T score of -2.7 which is slightly worse from the previous year. Repeat in one year.  Patient expressed understanding and was  in agreement with this plan. She also understands that She can call clinic at any time with any questions, concerns, or complaints.   Breast cancer   Staging form: Breast, AJCC 7th Edition     Clinical stage from 02/02/2015: Stage IA (T1b, N0, M0) - Signed by Lloyd Huger, MD on 02/02/2015   Lloyd Huger, MD   02/02/2015 11:40 AM

## 2015-02-10 ENCOUNTER — Other Ambulatory Visit: Payer: Self-pay | Admitting: Surgery

## 2015-02-10 DIAGNOSIS — Z853 Personal history of malignant neoplasm of breast: Secondary | ICD-10-CM

## 2015-02-21 ENCOUNTER — Telehealth: Payer: Self-pay | Admitting: *Deleted

## 2015-02-21 DIAGNOSIS — M810 Age-related osteoporosis without current pathological fracture: Secondary | ICD-10-CM

## 2015-02-21 MED ORDER — ALENDRONATE SODIUM 70 MG PO TABS: 70.0000 mg | ORAL_TABLET | ORAL | Status: DC

## 2015-02-21 NOTE — Telephone Encounter (Signed)
E scribed. States that stopping the med did not make a difference inhow she felt, so she restarted it

## 2015-02-23 ENCOUNTER — Telehealth: Payer: Self-pay | Admitting: *Deleted

## 2015-02-23 ENCOUNTER — Encounter: Payer: Self-pay | Admitting: *Deleted

## 2015-02-23 ENCOUNTER — Ambulatory Visit
Admission: RE | Admit: 2015-02-23 | Discharge: 2015-02-23 | Disposition: A | Discharge status: Home / Self Care | Payer: Medicare Other | Source: Ambulatory Visit | Attending: Surgery | Admitting: Surgery

## 2015-02-23 ENCOUNTER — Ambulatory Visit: Payer: Medicare Other

## 2015-02-23 DIAGNOSIS — Z853 Personal history of malignant neoplasm of breast: Secondary | ICD-10-CM | POA: Insufficient documentation

## 2015-02-23 DIAGNOSIS — H2511 Age-related nuclear cataract, right eye: Secondary | ICD-10-CM | POA: Diagnosis present

## 2015-02-23 DIAGNOSIS — M419 Scoliosis, unspecified: Secondary | ICD-10-CM | POA: Diagnosis not present

## 2015-02-23 DIAGNOSIS — Z90710 Acquired absence of both cervix and uterus: Secondary | ICD-10-CM | POA: Diagnosis not present

## 2015-02-23 DIAGNOSIS — G629 Polyneuropathy, unspecified: Secondary | ICD-10-CM | POA: Diagnosis not present

## 2015-02-23 DIAGNOSIS — Z885 Allergy status to narcotic agent status: Secondary | ICD-10-CM | POA: Diagnosis not present

## 2015-02-23 DIAGNOSIS — M1990 Unspecified osteoarthritis, unspecified site: Secondary | ICD-10-CM | POA: Diagnosis not present

## 2015-02-23 DIAGNOSIS — I10 Essential (primary) hypertension: Secondary | ICD-10-CM | POA: Diagnosis not present

## 2015-02-23 DIAGNOSIS — K219 Gastro-esophageal reflux disease without esophagitis: Secondary | ICD-10-CM | POA: Diagnosis not present

## 2015-02-23 DIAGNOSIS — M810 Age-related osteoporosis without current pathological fracture: Secondary | ICD-10-CM | POA: Diagnosis not present

## 2015-02-23 DIAGNOSIS — Z974 Presence of external hearing-aid: Secondary | ICD-10-CM | POA: Diagnosis not present

## 2015-02-23 DIAGNOSIS — E780 Pure hypercholesterolemia: Secondary | ICD-10-CM | POA: Diagnosis not present

## 2015-02-23 DIAGNOSIS — Z79899 Other long term (current) drug therapy: Secondary | ICD-10-CM | POA: Diagnosis not present

## 2015-02-23 DIAGNOSIS — Z950 Presence of cardiac pacemaker: Secondary | ICD-10-CM | POA: Diagnosis not present

## 2015-02-23 DIAGNOSIS — Z85828 Personal history of other malignant neoplasm of skin: Secondary | ICD-10-CM | POA: Diagnosis not present

## 2015-02-23 DIAGNOSIS — E890 Postprocedural hypothyroidism: Secondary | ICD-10-CM | POA: Diagnosis not present

## 2015-02-23 DIAGNOSIS — Z8673 Personal history of transient ischemic attack (TIA), and cerebral infarction without residual deficits: Secondary | ICD-10-CM | POA: Diagnosis not present

## 2015-02-23 DIAGNOSIS — Z7982 Long term (current) use of aspirin: Secondary | ICD-10-CM | POA: Diagnosis not present

## 2015-02-23 DIAGNOSIS — H9193 Unspecified hearing loss, bilateral: Secondary | ICD-10-CM | POA: Diagnosis not present

## 2015-02-23 DIAGNOSIS — Z9889 Other specified postprocedural states: Secondary | ICD-10-CM | POA: Diagnosis not present

## 2015-02-23 NOTE — Telephone Encounter (Signed)
Mail order pharamcy will not send new rx until the 12th, needs rx called to local pharmacy. Called # 4 tabs to [facility] as requested by pt

## 2015-03-01 ENCOUNTER — Ambulatory Visit: Payer: Medicare Other | Admitting: Anesthesiology

## 2015-03-01 ENCOUNTER — Encounter: Payer: Self-pay | Admitting: *Deleted

## 2015-03-01 ENCOUNTER — Encounter
Admission: RE | Disposition: A | Discharge status: Home / Self Care | Payer: Self-pay | Source: Ambulatory Visit | Attending: Ophthalmology

## 2015-03-01 ENCOUNTER — Ambulatory Visit
Admission: RE | Admit: 2015-03-01 | Discharge: 2015-03-01 | Disposition: A | Discharge status: Home / Self Care | Payer: Medicare Other | Source: Ambulatory Visit | Attending: Ophthalmology | Admitting: Ophthalmology

## 2015-03-01 DIAGNOSIS — E890 Postprocedural hypothyroidism: Secondary | ICD-10-CM | POA: Insufficient documentation

## 2015-03-01 DIAGNOSIS — Z885 Allergy status to narcotic agent status: Secondary | ICD-10-CM | POA: Insufficient documentation

## 2015-03-01 DIAGNOSIS — Z8673 Personal history of transient ischemic attack (TIA), and cerebral infarction without residual deficits: Secondary | ICD-10-CM | POA: Insufficient documentation

## 2015-03-01 DIAGNOSIS — Z85828 Personal history of other malignant neoplasm of skin: Secondary | ICD-10-CM | POA: Insufficient documentation

## 2015-03-01 DIAGNOSIS — M419 Scoliosis, unspecified: Secondary | ICD-10-CM | POA: Insufficient documentation

## 2015-03-01 DIAGNOSIS — M1990 Unspecified osteoarthritis, unspecified site: Secondary | ICD-10-CM | POA: Insufficient documentation

## 2015-03-01 DIAGNOSIS — Z79899 Other long term (current) drug therapy: Secondary | ICD-10-CM | POA: Insufficient documentation

## 2015-03-01 DIAGNOSIS — K219 Gastro-esophageal reflux disease without esophagitis: Secondary | ICD-10-CM | POA: Insufficient documentation

## 2015-03-01 DIAGNOSIS — I10 Essential (primary) hypertension: Secondary | ICD-10-CM | POA: Insufficient documentation

## 2015-03-01 DIAGNOSIS — Z853 Personal history of malignant neoplasm of breast: Secondary | ICD-10-CM | POA: Insufficient documentation

## 2015-03-01 DIAGNOSIS — M810 Age-related osteoporosis without current pathological fracture: Secondary | ICD-10-CM | POA: Insufficient documentation

## 2015-03-01 DIAGNOSIS — H2511 Age-related nuclear cataract, right eye: Secondary | ICD-10-CM | POA: Diagnosis not present

## 2015-03-01 DIAGNOSIS — H9193 Unspecified hearing loss, bilateral: Secondary | ICD-10-CM | POA: Insufficient documentation

## 2015-03-01 DIAGNOSIS — Z974 Presence of external hearing-aid: Secondary | ICD-10-CM | POA: Insufficient documentation

## 2015-03-01 DIAGNOSIS — E780 Pure hypercholesterolemia: Secondary | ICD-10-CM | POA: Insufficient documentation

## 2015-03-01 DIAGNOSIS — G629 Polyneuropathy, unspecified: Secondary | ICD-10-CM | POA: Insufficient documentation

## 2015-03-01 DIAGNOSIS — Z9889 Other specified postprocedural states: Secondary | ICD-10-CM | POA: Insufficient documentation

## 2015-03-01 DIAGNOSIS — Z90710 Acquired absence of both cervix and uterus: Secondary | ICD-10-CM | POA: Insufficient documentation

## 2015-03-01 DIAGNOSIS — Z950 Presence of cardiac pacemaker: Secondary | ICD-10-CM | POA: Insufficient documentation

## 2015-03-01 DIAGNOSIS — Z7982 Long term (current) use of aspirin: Secondary | ICD-10-CM | POA: Insufficient documentation

## 2015-03-01 HISTORY — DX: Unspecified hearing loss, unspecified ear: H91.90

## 2015-03-01 HISTORY — DX: Other fatigue: R53.83

## 2015-03-01 HISTORY — DX: Palpitations: R00.2

## 2015-03-01 HISTORY — DX: Reserved for inherently not codable concepts without codable children: IMO0001

## 2015-03-01 HISTORY — DX: Gastro-esophageal reflux disease without esophagitis: K21.9

## 2015-03-01 HISTORY — DX: Polyneuropathy, unspecified: G62.9

## 2015-03-01 HISTORY — DX: Scoliosis, unspecified: M41.9

## 2015-03-01 HISTORY — DX: Cardiac murmur, unspecified: R01.1

## 2015-03-01 HISTORY — DX: Unspecified osteoarthritis, unspecified site: M19.90

## 2015-03-01 HISTORY — DX: Major depressive disorder, single episode, unspecified: F32.9

## 2015-03-01 HISTORY — DX: Depression, unspecified: F32.A

## 2015-03-01 HISTORY — PX: CATARACT EXTRACTION W/PHACO: SHX586

## 2015-03-01 SURGERY — PHACOEMULSIFICATION, CATARACT, WITH IOL INSERTION
Anesthesia: Monitor Anesthesia Care | Site: Eye | Laterality: Right | Wound class: Clean

## 2015-03-01 MED ORDER — MOXIFLOXACIN HCL 0.5 % OP SOLN: 1.0000 [drp] | OPHTHALMIC | Status: DC | PRN

## 2015-03-01 MED ORDER — CARBACHOL 0.01 % IO SOLN
INTRAOCULAR | Status: DC | PRN
  Administered 2015-03-01: 0.5 mL via INTRAOCULAR

## 2015-03-01 MED ORDER — BSS IO SOLN
INTRAOCULAR | Status: DC | PRN
  Administered 2015-03-01: 200 mL via OPHTHALMIC

## 2015-03-01 MED ORDER — NA CHONDROIT SULF-NA HYALURON 40-17 MG/ML IO SOLN
INTRAOCULAR | Status: AC
  Filled 2015-03-01: qty 1

## 2015-03-01 MED ORDER — POVIDONE-IODINE 5 % OP SOLN
1.0000 "application " | OPHTHALMIC | Status: AC | PRN
  Administered 2015-03-01: 1 via OPHTHALMIC

## 2015-03-01 MED ORDER — NA CHONDROIT SULF-NA HYALURON 40-17 MG/ML IO SOLN
INTRAOCULAR | Status: DC | PRN
  Administered 2015-03-01: 1 mL via INTRAOCULAR

## 2015-03-01 MED ORDER — EPINEPHRINE HCL 1 MG/ML IJ SOLN
INTRAMUSCULAR | Status: AC
  Filled 2015-03-01: qty 1

## 2015-03-01 MED ORDER — ARMC OPHTHALMIC DILATING GEL
1.0000 "application " | OPHTHALMIC | Status: DC | PRN
  Administered 2015-03-01: 1 via OPHTHALMIC

## 2015-03-01 MED ORDER — TETRACAINE HCL 0.5 % OP SOLN
1.0000 [drp] | OPHTHALMIC | Status: AC | PRN
  Administered 2015-03-01: 1 [drp] via OPHTHALMIC

## 2015-03-01 MED ORDER — CEFUROXIME OPHTHALMIC INJECTION 1 MG/0.1 ML
INJECTION | OPHTHALMIC | Status: AC
  Filled 2015-03-01: qty 0.1

## 2015-03-01 MED ORDER — CEFUROXIME OPHTHALMIC INJECTION 1 MG/0.1 ML
INJECTION | OPHTHALMIC | Status: DC | PRN
  Administered 2015-03-01: 0.1 mL via INTRACAMERAL

## 2015-03-01 MED ORDER — SODIUM CHLORIDE 0.9 % IV SOLN
INTRAVENOUS | Status: DC
  Administered 2015-03-01: 09:00:00 via INTRAVENOUS

## 2015-03-01 MED ORDER — MIDAZOLAM HCL 2 MG/2ML IJ SOLN
INTRAMUSCULAR | Status: DC | PRN
  Administered 2015-03-01: 1 mg via INTRAVENOUS

## 2015-03-01 SURGICAL SUPPLY — 23 items
CANNULA ANT/CHMB 27G (MISCELLANEOUS) ×1 IMPLANT
CANNULA ANT/CHMB 27GA (MISCELLANEOUS) ×2 IMPLANT
CUP MEDICINE 2OZ PLAST GRAD ST (MISCELLANEOUS) ×2 IMPLANT
GLOVE BIO SURGEON STRL SZ8 (GLOVE) ×2 IMPLANT
GLOVE BIOGEL M 6.5 STRL (GLOVE) ×2 IMPLANT
GLOVE SURG LX 8.0 MICRO (GLOVE) ×1
GLOVE SURG LX STRL 8.0 MICRO (GLOVE) ×1 IMPLANT
GOWN STRL REUS W/ TWL LRG LVL3 (GOWN DISPOSABLE) ×2 IMPLANT
GOWN STRL REUS W/TWL LRG LVL3 (GOWN DISPOSABLE) ×4
LENS IOL TECNIS 22.5 (Intraocular Lens) ×2 IMPLANT
LENS IOL TECNIS MONO 1P 22.5 (Intraocular Lens) IMPLANT
PACK CATARACT (MISCELLANEOUS) ×2 IMPLANT
PACK CATARACT BRASINGTON LX (MISCELLANEOUS) ×2 IMPLANT
PACK EYE AFTER SURG (MISCELLANEOUS) ×2 IMPLANT
SOL BSS BAG (MISCELLANEOUS) ×2
SOL PREP PVP 2OZ (MISCELLANEOUS) ×2
SOLUTION BSS BAG (MISCELLANEOUS) ×1 IMPLANT
SOLUTION PREP PVP 2OZ (MISCELLANEOUS) ×1 IMPLANT
SYR 3ML LL SCALE MARK (SYRINGE) ×2 IMPLANT
SYR 5ML LL (SYRINGE) ×2 IMPLANT
SYR TB 1ML 27GX1/2 LL (SYRINGE) ×2 IMPLANT
WATER STERILE IRR 1000ML POUR (IV SOLUTION) ×2 IMPLANT
WIPE NON LINTING 3.25X3.25 (MISCELLANEOUS) ×2 IMPLANT

## 2015-03-01 NOTE — Transfer of Care (Signed)
Immediate Anesthesia Transfer of Care Note  Patient: Sue Knapp  Procedure(s) Performed: Procedure(s) with comments: CATARACT EXTRACTION PHACO AND INTRAOCULAR LENS PLACEMENT (IOC) (Right) - US:1:03.1 AP:22.8 CDE:14.37  Fluid lot # [id number]  Patient Location: PACU  Anesthesia Type:MAC  Level of Consciousness: awake, alert  and oriented  Airway & Oxygen Therapy: Patient Spontanous Breathing  Post-op Assessment: Report given to RN and Post -op Vital signs reviewed and stable  Post vital signs: Reviewed and stable  Last Vitals:  Filed Vitals:   03/01/15 1037  BP: 150/67  Pulse: 74  Temp: 36.8 C  Resp: 12    Complications: No apparent anesthesia complications

## 2015-03-01 NOTE — Op Note (Signed)
PREOPERATIVE DIAGNOSIS:  Nuclear sclerotic cataract of the right eye.   POSTOPERATIVE DIAGNOSIS: right nuclear sclerotic cataract    OPERATIVE PROCEDURE:  Procedure(s): CATARACT EXTRACTION PHACO AND INTRAOCULAR LENS PLACEMENT (IOC)   SURGEON:  Birder Robson, MD.   ANESTHESIA:  Anesthesiologist: Martha Clan, MD CRNA: Aline Brochure, CRNA  1.      Managed anesthesia care. 2.      Topical tetracaine drops followed by 2% Xylocaine jelly applied in the preoperative holding area.   COMPLICATIONS:  None.   TECHNIQUE:   Stop and chop   DESCRIPTION OF PROCEDURE:  The patient was examined and consented in the preoperative holding area where the aforementioned topical anesthesia was applied to the right eye and then brought back to the Operating Room where the right eye was prepped and draped in the usual sterile ophthalmic fashion and a lid speculum was placed. A paracentesis was created with the side port blade and the anterior chamber was filled with viscoelastic. A near clear corneal incision was performed with the steel keratome. A continuous curvilinear capsulorrhexis was performed with a cystotome followed by the capsulorrhexis forceps. Hydrodissection and hydrodelineation were carried out with BSS on a blunt cannula. The lens was removed in a stop and chop  technique and the remaining cortical material was removed with the irrigation-aspiration handpiece. The capsular bag was inflated with viscoelastic and the Technis ZCB00  lens was placed in the capsular bag without complication. The remaining viscoelastic was removed from the eye with the irrigation-aspiration handpiece. The wounds were hydrated. The anterior chamber was flushed with Miostat and the eye was inflated to physiologic pressure. 0.1 mL of cefuroxime concentration 10 mg/mL was placed in the anterior chamber. The wounds were found to be water tight. The eye was dressed with Vigamox. The patient was given protective glasses to  wear throughout the day and a shield with which to sleep tonight. The patient was also given drops with which to begin a drop regimen today and will follow-up with me in one day. * No implants in log * Procedure(s) with comments: CATARACT EXTRACTION PHACO AND INTRAOCULAR LENS PLACEMENT (IOC) (Right) - US:1:03.1 AP:22.8 CDE:14.37  Fluid lot # [id number]  Electronically signed: [place] 03/01/2015 10:33 AM

## 2015-03-01 NOTE — Anesthesia Preprocedure Evaluation (Signed)
Anesthesia Evaluation  Patient identified by MRN, date of birth, ID band Patient awake    Reviewed: Allergy & Precautions, H&P , NPO status , Patient's Chart, lab work & pertinent test results, reviewed documented beta blocker date and time   History of Anesthesia Complications Negative for: history of anesthetic complications  Airway Mallampati: II  TM Distance: >3 FB Neck ROM: full    Dental no notable dental hx.    Pulmonary neg pulmonary ROS,  breath sounds clear to auscultation  Pulmonary exam normal       Cardiovascular Exercise Tolerance: Good hypertension, On Medications - angina- CAD, - Past MI, - Cardiac Stents and - CABG Normal cardiovascular exam+ dysrhythmias (LBBB) + pacemaker (for symptomatic bradycardia) + Valvular Problems/Murmurs Rhythm:regular Rate:Normal     Neuro/Psych PSYCHIATRIC DISORDERS (depression) TIA   GI/Hepatic Neg liver ROS, hiatal hernia, GERD-  Medicated and Controlled,  Endo/Other  Hypothyroidism   Renal/GU negative Renal ROS  negative genitourinary   Musculoskeletal   Abdominal   Peds  Hematology negative hematology ROS (+)   Anesthesia Other Findings Past Medical History:   Hypertension                                                 Cataract                                                     Hyperlipidemia                                               Glaucoma                                                     Osteoporosis                                                 Hypothyroidism                                               TIA (transient ischemic attack)                              Left bundle branch block                                     Hiatal hernia  Thyroid nodule                                               Pacemaker                                                    Shortness of breath dyspnea                                   Heart murmur                                                 Neuropathy                                                   GERD (gastroesophageal reflux disease)                       Depression                                                   HOH (hard of hearing)                                          Comment:AIDS   Arthritis                                                    Scoliosis                                                    Fatigue                                                      Heart palpitations                                           Breast cancer  2013           Comment:left breast, radiation and lumpectomy   Reproductive/Obstetrics negative OB ROS                             Anesthesia Physical Anesthesia Plan  ASA: III  Anesthesia Plan: MAC   Post-op Pain Management:    Induction:   Airway Management Planned:   Additional Equipment:   Intra-op Plan:   Post-operative Plan:   Informed Consent: I have reviewed the patients History and Physical, chart, labs and discussed the procedure including the risks, benefits and alternatives for the proposed anesthesia with the patient or authorized representative who has indicated his/her understanding and acceptance.   Dental Advisory Given  Plan Discussed with: Anesthesiologist, CRNA and Surgeon  Anesthesia Plan Comments:         Anesthesia Quick Evaluation

## 2015-03-01 NOTE — Anesthesia Postprocedure Evaluation (Signed)
  Anesthesia Post-op Note  Patient: Sue Knapp  Procedure(s) Performed: Procedure(s) with comments: CATARACT EXTRACTION PHACO AND INTRAOCULAR LENS PLACEMENT (IOC) (Right) - US:1:03.1 AP:22.8 CDE:14.37  Fluid lot # [id number]  Anesthesia type:MAC  Patient location: PACU  Post pain: Pain level controlled  Post assessment: Post-op Vital signs reviewed, Patient's Cardiovascular Status Stable, Respiratory Function Stable, Patent Airway and No signs of Nausea or vomiting  Post vital signs: Reviewed and stable  Last Vitals:  Filed Vitals:   03/01/15 1037  BP: 150/67  Pulse: 74  Temp: 36.8 C  Resp: 12    Level of consciousness: awake, alert  and patient cooperative  Complications: No apparent anesthesia complications

## 2015-03-01 NOTE — H&P (Signed)
  All labs reviewed. Abnormal studies sent to patients PCP when indicated.  Previous H&P reviewed, patient examined, there are NO CHANGES.  Sue Knapp LOUIS8/9/201610:09 AM

## 2015-03-01 NOTE — Discharge Instructions (Signed)
AMBULATORY SURGERY  DISCHARGE INSTRUCTIONS   1) The drugs that you were given will stay in your system until tomorrow so for the next 24 hours you should not:  A) Drive an automobile B) Make any legal decisions C) Drink any alcoholic beverage   2) You may resume regular meals tomorrow.  Today it is better to start with liquids and gradually work up to solid foods.  You may eat anything you prefer, but it is better to start with liquids, then soup and crackers, and gradually work up to solid foods.   3) Please notify your doctor immediately if you have any unusual bleeding, trouble breathing, redness and pain at the surgery site, drainage, fever, or pain not relieved by medication.    4) Additional Instructions:   Eye Surgery Discharge Instructions  Expect mild scratchy sensation or mild soreness. DO NOT RUB YOUR EYE!  The day of surgery:  Minimal physical activity, but bed rest is not required  No reading, computer work, or close hand work  No bending, lifting, or straining.  May watch TV  For 24 hours:  No driving, legal decisions, or alcoholic beverages  Safety precautions  Eat anything you prefer: It is better to start with liquids, then soup then solid foods.  _____ Eye patch should be worn until postoperative exam tomorrow.  ____ Solar shield eyeglasses should be worn for comfort in the sunlight/patch while sleeping  Resume all regular medications including aspirin or Coumadin if these were discontinued prior to surgery. You may shower, bathe, shave, or wash your hair. Tylenol may be taken for mild discomfort.  Call your doctor if you experience significant pain, nausea, or vomiting, fever > 101 or other signs of infection. [phone] or [phone] Specific instructions:  Follow-up Information    Follow up with PORFILIO,WILLIAM LOUIS, MD In 1 day.   Specialty:  Ophthalmology   Why:  August 10 at 9:45am   Contact information:   [place] [address] [phone]          Please contact your physician with any problems or Same Day Surgery at [phone], Monday through Friday 6 am to 4 pm, or [place] at [facility] number at [phone].

## 2015-03-16 ENCOUNTER — Encounter: Payer: Self-pay | Admitting: *Deleted

## 2015-03-16 DIAGNOSIS — G629 Polyneuropathy, unspecified: Secondary | ICD-10-CM | POA: Diagnosis not present

## 2015-03-16 DIAGNOSIS — M419 Scoliosis, unspecified: Secondary | ICD-10-CM | POA: Diagnosis not present

## 2015-03-16 DIAGNOSIS — Z90710 Acquired absence of both cervix and uterus: Secondary | ICD-10-CM | POA: Diagnosis not present

## 2015-03-16 DIAGNOSIS — E041 Nontoxic single thyroid nodule: Secondary | ICD-10-CM | POA: Diagnosis not present

## 2015-03-16 DIAGNOSIS — E780 Pure hypercholesterolemia: Secondary | ICD-10-CM | POA: Diagnosis not present

## 2015-03-16 DIAGNOSIS — M810 Age-related osteoporosis without current pathological fracture: Secondary | ICD-10-CM | POA: Diagnosis not present

## 2015-03-16 DIAGNOSIS — Z8673 Personal history of transient ischemic attack (TIA), and cerebral infarction without residual deficits: Secondary | ICD-10-CM | POA: Diagnosis not present

## 2015-03-16 DIAGNOSIS — Z885 Allergy status to narcotic agent status: Secondary | ICD-10-CM | POA: Diagnosis not present

## 2015-03-16 DIAGNOSIS — Z85828 Personal history of other malignant neoplasm of skin: Secondary | ICD-10-CM | POA: Diagnosis not present

## 2015-03-16 DIAGNOSIS — H2512 Age-related nuclear cataract, left eye: Secondary | ICD-10-CM | POA: Diagnosis present

## 2015-03-16 DIAGNOSIS — K219 Gastro-esophageal reflux disease without esophagitis: Secondary | ICD-10-CM | POA: Diagnosis not present

## 2015-03-16 DIAGNOSIS — I447 Left bundle-branch block, unspecified: Secondary | ICD-10-CM | POA: Diagnosis not present

## 2015-03-16 DIAGNOSIS — F329 Major depressive disorder, single episode, unspecified: Secondary | ICD-10-CM | POA: Diagnosis not present

## 2015-03-16 DIAGNOSIS — H9190 Unspecified hearing loss, unspecified ear: Secondary | ICD-10-CM | POA: Diagnosis not present

## 2015-03-16 DIAGNOSIS — K5790 Diverticulosis of intestine, part unspecified, without perforation or abscess without bleeding: Secondary | ICD-10-CM | POA: Diagnosis not present

## 2015-03-16 DIAGNOSIS — Z950 Presence of cardiac pacemaker: Secondary | ICD-10-CM | POA: Diagnosis not present

## 2015-03-16 DIAGNOSIS — E039 Hypothyroidism, unspecified: Secondary | ICD-10-CM | POA: Diagnosis not present

## 2015-03-16 DIAGNOSIS — Z853 Personal history of malignant neoplasm of breast: Secondary | ICD-10-CM | POA: Diagnosis not present

## 2015-03-16 DIAGNOSIS — M1990 Unspecified osteoarthritis, unspecified site: Secondary | ICD-10-CM | POA: Diagnosis not present

## 2015-03-16 DIAGNOSIS — K449 Diaphragmatic hernia without obstruction or gangrene: Secondary | ICD-10-CM | POA: Diagnosis not present

## 2015-03-16 DIAGNOSIS — I10 Essential (primary) hypertension: Secondary | ICD-10-CM | POA: Diagnosis not present

## 2015-03-16 NOTE — OR Nursing (Signed)
PACEMAKER INFO TO OR

## 2015-03-22 ENCOUNTER — Encounter: Payer: Self-pay | Admitting: *Deleted

## 2015-03-22 ENCOUNTER — Ambulatory Visit: Payer: Medicare Other | Admitting: Anesthesiology

## 2015-03-22 ENCOUNTER — Ambulatory Visit
Admission: RE | Admit: 2015-03-22 | Discharge: 2015-03-22 | Disposition: A | Discharge status: Home / Self Care | Payer: Medicare Other | Source: Ambulatory Visit | Attending: Ophthalmology | Admitting: Ophthalmology

## 2015-03-22 ENCOUNTER — Encounter
Admission: RE | Disposition: A | Discharge status: Home / Self Care | Payer: Self-pay | Source: Ambulatory Visit | Attending: Ophthalmology

## 2015-03-22 DIAGNOSIS — E041 Nontoxic single thyroid nodule: Secondary | ICD-10-CM | POA: Insufficient documentation

## 2015-03-22 DIAGNOSIS — Z950 Presence of cardiac pacemaker: Secondary | ICD-10-CM | POA: Insufficient documentation

## 2015-03-22 DIAGNOSIS — E039 Hypothyroidism, unspecified: Secondary | ICD-10-CM | POA: Insufficient documentation

## 2015-03-22 DIAGNOSIS — H2512 Age-related nuclear cataract, left eye: Secondary | ICD-10-CM | POA: Diagnosis not present

## 2015-03-22 DIAGNOSIS — K219 Gastro-esophageal reflux disease without esophagitis: Secondary | ICD-10-CM | POA: Insufficient documentation

## 2015-03-22 DIAGNOSIS — Z885 Allergy status to narcotic agent status: Secondary | ICD-10-CM | POA: Insufficient documentation

## 2015-03-22 DIAGNOSIS — M810 Age-related osteoporosis without current pathological fracture: Secondary | ICD-10-CM | POA: Insufficient documentation

## 2015-03-22 DIAGNOSIS — I10 Essential (primary) hypertension: Secondary | ICD-10-CM | POA: Insufficient documentation

## 2015-03-22 DIAGNOSIS — E780 Pure hypercholesterolemia: Secondary | ICD-10-CM | POA: Insufficient documentation

## 2015-03-22 DIAGNOSIS — K5790 Diverticulosis of intestine, part unspecified, without perforation or abscess without bleeding: Secondary | ICD-10-CM | POA: Insufficient documentation

## 2015-03-22 DIAGNOSIS — Z853 Personal history of malignant neoplasm of breast: Secondary | ICD-10-CM | POA: Insufficient documentation

## 2015-03-22 DIAGNOSIS — G629 Polyneuropathy, unspecified: Secondary | ICD-10-CM | POA: Insufficient documentation

## 2015-03-22 DIAGNOSIS — H9190 Unspecified hearing loss, unspecified ear: Secondary | ICD-10-CM | POA: Insufficient documentation

## 2015-03-22 DIAGNOSIS — Z8673 Personal history of transient ischemic attack (TIA), and cerebral infarction without residual deficits: Secondary | ICD-10-CM | POA: Insufficient documentation

## 2015-03-22 DIAGNOSIS — M419 Scoliosis, unspecified: Secondary | ICD-10-CM | POA: Insufficient documentation

## 2015-03-22 DIAGNOSIS — I447 Left bundle-branch block, unspecified: Secondary | ICD-10-CM | POA: Insufficient documentation

## 2015-03-22 DIAGNOSIS — M1990 Unspecified osteoarthritis, unspecified site: Secondary | ICD-10-CM | POA: Insufficient documentation

## 2015-03-22 DIAGNOSIS — Z85828 Personal history of other malignant neoplasm of skin: Secondary | ICD-10-CM | POA: Insufficient documentation

## 2015-03-22 DIAGNOSIS — F329 Major depressive disorder, single episode, unspecified: Secondary | ICD-10-CM | POA: Insufficient documentation

## 2015-03-22 DIAGNOSIS — Z90710 Acquired absence of both cervix and uterus: Secondary | ICD-10-CM | POA: Insufficient documentation

## 2015-03-22 DIAGNOSIS — K449 Diaphragmatic hernia without obstruction or gangrene: Secondary | ICD-10-CM | POA: Insufficient documentation

## 2015-03-22 HISTORY — DX: Palpitations: R00.2

## 2015-03-22 HISTORY — PX: CATARACT EXTRACTION W/PHACO: SHX586

## 2015-03-22 SURGERY — PHACOEMULSIFICATION, CATARACT, WITH IOL INSERTION
Anesthesia: Monitor Anesthesia Care | Site: Eye | Laterality: Left | Wound class: Clean

## 2015-03-22 MED ORDER — MOXIFLOXACIN HCL 0.5 % OP SOLN: 1.0000 [drp] | OPHTHALMIC | Status: DC | PRN

## 2015-03-22 MED ORDER — FENTANYL CITRATE (PF) 100 MCG/2ML IJ SOLN: 25.0000 ug | INTRAMUSCULAR | Status: DC | PRN

## 2015-03-22 MED ORDER — MOXIFLOXACIN HCL 0.5 % OP SOLN
OPHTHALMIC | Status: AC
  Filled 2015-03-22: qty 3

## 2015-03-22 MED ORDER — ARMC OPHTHALMIC DILATING GEL
OPHTHALMIC | Status: AC
  Administered 2015-03-22: 1 via OPHTHALMIC
  Filled 2015-03-22: qty 0.25

## 2015-03-22 MED ORDER — NA CHONDROIT SULF-NA HYALURON 40-17 MG/ML IO SOLN
INTRAOCULAR | Status: DC | PRN
  Administered 2015-03-22: 1 mL via INTRAOCULAR

## 2015-03-22 MED ORDER — ARMC OPHTHALMIC DILATING GEL
1.0000 "application " | OPHTHALMIC | Status: DC | PRN
  Administered 2015-03-22: 1 via OPHTHALMIC

## 2015-03-22 MED ORDER — POVIDONE-IODINE 5 % OP SOLN
1.0000 "application " | OPHTHALMIC | Status: AC | PRN
  Administered 2015-03-22: 1 via OPHTHALMIC

## 2015-03-22 MED ORDER — MIDAZOLAM HCL 2 MG/2ML IJ SOLN
INTRAMUSCULAR | Status: DC | PRN
  Administered 2015-03-22: .5 mg via INTRAVENOUS

## 2015-03-22 MED ORDER — EPINEPHRINE HCL 1 MG/ML IJ SOLN
INTRAMUSCULAR | Status: AC
  Filled 2015-03-22: qty 1

## 2015-03-22 MED ORDER — SODIUM CHLORIDE 0.9 % IV SOLN
INTRAVENOUS | Status: DC
  Administered 2015-03-22: 11:00:00 via INTRAVENOUS

## 2015-03-22 MED ORDER — POVIDONE-IODINE 5 % OP SOLN
OPHTHALMIC | Status: AC
  Administered 2015-03-22: 1 via OPHTHALMIC
  Filled 2015-03-22: qty 30

## 2015-03-22 MED ORDER — MOXIFLOXACIN HCL 0.5 % OP SOLN
OPHTHALMIC | Status: DC | PRN
  Administered 2015-03-22: 1 [drp] via OPHTHALMIC

## 2015-03-22 MED ORDER — ONDANSETRON HCL 4 MG/2ML IJ SOLN: 4.0000 mg | Freq: Once | INTRAMUSCULAR | Status: DC | PRN

## 2015-03-22 MED ORDER — CEFUROXIME OPHTHALMIC INJECTION 1 MG/0.1 ML
INJECTION | OPHTHALMIC | Status: DC | PRN
  Administered 2015-03-22: 0.1 mL via INTRACAMERAL

## 2015-03-22 MED ORDER — TETRACAINE HCL 0.5 % OP SOLN
1.0000 [drp] | OPHTHALMIC | Status: AC | PRN
  Administered 2015-03-22: 1 [drp] via OPHTHALMIC

## 2015-03-22 MED ORDER — EPINEPHRINE HCL 1 MG/ML IJ SOLN
INTRAOCULAR | Status: DC | PRN
  Administered 2015-03-22: 200 mL via OPHTHALMIC

## 2015-03-22 MED ORDER — NA CHONDROIT SULF-NA HYALURON 40-17 MG/ML IO SOLN
INTRAOCULAR | Status: AC
  Filled 2015-03-22: qty 1

## 2015-03-22 MED ORDER — CEFUROXIME OPHTHALMIC INJECTION 1 MG/0.1 ML
INJECTION | OPHTHALMIC | Status: AC
  Filled 2015-03-22: qty 0.1

## 2015-03-22 SURGICAL SUPPLY — 23 items
CANNULA ANT/CHMB 27G (MISCELLANEOUS) ×1 IMPLANT
CANNULA ANT/CHMB 27GA (MISCELLANEOUS) ×2 IMPLANT
CUP MEDICINE 2OZ PLAST GRAD ST (MISCELLANEOUS) ×2 IMPLANT
GLOVE BIO SURGEON STRL SZ8 (GLOVE) ×2 IMPLANT
GLOVE BIOGEL M 6.5 STRL (GLOVE) ×2 IMPLANT
GLOVE SURG LX 8.0 MICRO (GLOVE) ×1
GLOVE SURG LX STRL 8.0 MICRO (GLOVE) ×1 IMPLANT
GOWN STRL REUS W/ TWL LRG LVL3 (GOWN DISPOSABLE) ×2 IMPLANT
GOWN STRL REUS W/TWL LRG LVL3 (GOWN DISPOSABLE) ×4
LENS IOL TECNIS 22.0 (Intraocular Lens) ×2 IMPLANT
LENS IOL TECNIS MONO 1P 22.0 (Intraocular Lens) IMPLANT
PACK CATARACT (MISCELLANEOUS) ×2 IMPLANT
PACK CATARACT BRASINGTON LX (MISCELLANEOUS) ×2 IMPLANT
PACK EYE AFTER SURG (MISCELLANEOUS) ×2 IMPLANT
SOL BSS BAG (MISCELLANEOUS) ×2
SOL PREP PVP 2OZ (MISCELLANEOUS) ×2
SOLUTION BSS BAG (MISCELLANEOUS) ×1 IMPLANT
SOLUTION PREP PVP 2OZ (MISCELLANEOUS) ×1 IMPLANT
SYR 3ML LL SCALE MARK (SYRINGE) ×2 IMPLANT
SYR 5ML LL (SYRINGE) ×2 IMPLANT
SYR TB 1ML 27GX1/2 LL (SYRINGE) ×2 IMPLANT
WATER STERILE IRR 1000ML POUR (IV SOLUTION) ×2 IMPLANT
WIPE NON LINTING 3.25X3.25 (MISCELLANEOUS) ×2 IMPLANT

## 2015-03-22 NOTE — Discharge Instructions (Signed)
AMBULATORY SURGERY  DISCHARGE INSTRUCTIONS   1) The drugs that you were given will stay in your system until tomorrow so for the next 24 hours you should not:  A) Drive an automobile B) Make any legal decisions C) Drink any alcoholic beverage   2) You may resume regular meals tomorrow.  Today it is better to start with liquids and gradually work up to solid foods.  You may eat anything you prefer, but it is better to start with liquids, then soup and crackers, and gradually work up to solid foods.   3) Please notify your doctor immediately if you have any unusual bleeding, trouble breathing, redness and pain at the surgery site, drainage, fever, or pain not relieved by medication.    4) Additional Instructions:     Eye Surgery Discharge Instructions  Expect mild scratchy sensation or mild soreness. DO NOT RUB YOUR EYE!  The day of surgery:  Minimal physical activity, but bed rest is not required  No reading, computer work, or close hand work  No bending, lifting, or straining.  May watch TV  For 24 hours:  No driving, legal decisions, or alcoholic beverages  Safety precautions  Eat anything you prefer: It is better to start with liquids, then soup then solid foods.  _____ Eye patch should be worn until postoperative exam tomorrow.  ____ Solar shield eyeglasses should be worn for comfort in the sunlight/patch while sleeping  Resume all regular medications including aspirin or Coumadin if these were discontinued prior to surgery. You may shower, bathe, shave, or wash your hair. Tylenol may be taken for mild discomfort.  Call your doctor if you experience significant pain, nausea, or vomiting, fever > 101 or other signs of infection. [phone] or [phone] Specific instructions:  Follow-up Information    Follow up with PORFILIO,WILLIAM LOUIS, MD In 1 day.   Specialty:  Ophthalmology   Why:  August 31 at 10:05am   Contact information:   [place] [address] [phone]        Please contact your physician with any problems or Same Day Surgery at [phone], Monday through Friday 6 am to 4 pm, or [place] at [facility] number at [phone].

## 2015-03-22 NOTE — Anesthesia Postprocedure Evaluation (Signed)
  Anesthesia Post-op Note  Patient: Sue Knapp  Procedure(s) Performed: Procedure(s) with comments: CATARACT EXTRACTION PHACO AND INTRAOCULAR LENS PLACEMENT (IOC) (Left) - [place]: 01:01.0 AP%: 22.5% CDE: 13.75 Lot # [id number]  Anesthesia type:MAC  Patient location: short stay  Post pain: Pain level controlled  Post assessment: Post-op Vital signs reviewed, Patient's Cardiovascular Status Stable, Respiratory Function Stable, Patent Airway and No signs of Nausea or vomiting  Post vital signs: Reviewed and stable  Last Vitals:  Filed Vitals:   03/22/15 1058  BP: 167/73  Pulse: 81  Temp: 36.7 C  Resp: 16    Level of consciousness: awake, alert  and patient cooperative  Complications: No apparent anesthesia complications

## 2015-03-22 NOTE — Op Note (Signed)
PREOPERATIVE DIAGNOSIS:  Nuclear sclerotic cataract of the left eye.   POSTOPERATIVE DIAGNOSIS:  left nuclear sclerotic cataract   OPERATIVE PROCEDURE:  Procedure(s): CATARACT EXTRACTION PHACO AND INTRAOCULAR LENS PLACEMENT (IOC)   SURGEON:  Birder Robson, MD.   ANESTHESIA:   Anesthesiologist: Alvin Critchley, MD CRNA: Delaney Meigs, CRNA  1.      Managed anesthesia care. 2.      Topical tetracaine drops followed by 2% Xylocaine jelly applied in the preoperative holding area.   COMPLICATIONS:  None.   TECHNIQUE:   Stop and chop   DESCRIPTION OF PROCEDURE:  The patient was examined and consented in the preoperative holding area where the aforementioned topical anesthesia was applied to the left eye and then brought back to the Operating Room where the left eye was prepped and draped in the usual sterile ophthalmic fashion and a lid speculum was placed. A paracentesis was created with the side port blade and the anterior chamber was filled with viscoelastic. A near clear corneal incision was performed with the steel keratome. A continuous curvilinear capsulorrhexis was performed with a cystotome followed by the capsulorrhexis forceps. Hydrodissection and hydrodelineation were carried out with BSS on a blunt cannula. The lens was removed in a stop and chop  technique and the remaining cortical material was removed with the irrigation-aspiration handpiece. The capsular bag was inflated with viscoelastic and the Technis ZCB00 lens was placed in the capsular bag without complication. The remaining viscoelastic was removed from the eye with the irrigation-aspiration handpiece. The wounds were hydrated. The anterior chamber was flushed with Miostat and the eye was inflated to physiologic pressure. 0.1 mL of cefuroxime concentration 10 mg/mL was placed in the anterior chamber. The wounds were found to be water tight. The eye was dressed with Vigamox. The patient was given protective glasses to wear  throughout the day and a shield with which to sleep tonight. The patient was also given drops with which to begin a drop regimen today and will follow-up with me in one day.  Implant Name Type Inv. Item Serial No. Manufacturer Lot No. LRB No. Used  LENS IMPL INTRAOC ZCB00 22.0 - [id number] Intraocular Lens LENS IMPL INTRAOC ZCB00 22.0 [id number]   Left 1   Procedure(s) with comments: CATARACT EXTRACTION PHACO AND INTRAOCULAR LENS PLACEMENT (IOC) (Left) - [place]: 01:01.0 AP%: 22.5% CDE: 13.75 Lot # [id number]  Electronically signed: Eleny Cortez LOUIS 03/22/2015 11:59 AM

## 2015-03-22 NOTE — Anesthesia Preprocedure Evaluation (Addendum)
Anesthesia Evaluation  Patient identified by MRN, date of birth, ID band Patient awake    Reviewed: Allergy & Precautions, NPO status , Patient's Chart, lab work & pertinent test results  History of Anesthesia Complications (+) PONV  Airway Mallampati: III  TM Distance: <3 FB Neck ROM: Limited    Dental no notable dental hx. (+) Chipped, Caps   Pulmonary shortness of breath and with exertion,    Pulmonary exam normal       Cardiovascular hypertension, Normal cardiovascular exam+ dysrhythmias + pacemaker  Left Bundle branch block   Neuro/Psych PSYCHIATRIC DISORDERS Depression neuropathy TIA   GI/Hepatic hiatal hernia, GERD-  Medicated and Controlled,  Endo/Other  Hypothyroidism   Renal/GU      Musculoskeletal  (+) Arthritis -, Osteoarthritis,  scoliosis   Abdominal Normal abdominal exam  (+)   Peds  Hematology   Anesthesia Other Findings   Reproductive/Obstetrics                            Anesthesia Physical Anesthesia Plan  ASA: III  Anesthesia Plan: MAC   Post-op Pain Management:    Induction: Intravenous  Airway Management Planned: Nasal Cannula  Additional Equipment:   Intra-op Plan:   Post-operative Plan:   Informed Consent: I have reviewed the patients History and Physical, chart, labs and discussed the procedure including the risks, benefits and alternatives for the proposed anesthesia with the patient or authorized representative who has indicated his/her understanding and acceptance.   Dental advisory given  Plan Discussed with: CRNA and Surgeon  Anesthesia Plan Comments:         Anesthesia Quick Evaluation

## 2015-03-22 NOTE — H&P (Signed)
  All labs reviewed. Abnormal studies sent to patients PCP when indicated.  Previous H&P reviewed, patient examined, there are NO CHANGES.  Sue Paulette LOUIS8/30/201611:33 AM

## 2015-03-22 NOTE — Transfer of Care (Signed)
Immediate Anesthesia Transfer of Care Note  Patient: Sue Knapp  Procedure(s) Performed: Procedure(s) with comments: CATARACT EXTRACTION PHACO AND INTRAOCULAR LENS PLACEMENT (IOC) (Left) - [place]: 01:01.0 AP%: 22.5% CDE: 13.75 Lot # [id number]  Patient Location: PACU and Short Stay  Anesthesia Type:MAC  Level of Consciousness: awake, alert  and oriented  Airway & Oxygen Therapy: Patient Spontanous Breathing and Patient connected to nasal cannula oxygen  Post-op Assessment: Report given to RN and Post -op Vital signs reviewed and stable  Post vital signs: Reviewed and stable  Last Vitals: 97% 76hr 175/69 18resp Filed Vitals:   03/22/15 1058  BP: 167/73  Pulse: 81  Temp: 36.7 C  Resp: 16    Complications: No apparent anesthesia complications

## 2015-03-27 ENCOUNTER — Other Ambulatory Visit: Payer: Self-pay | Admitting: Oncology

## 2015-05-26 ENCOUNTER — Ambulatory Visit: Payer: Medicare Other

## 2015-06-01 ENCOUNTER — Ambulatory Visit: Payer: Medicare Other | Attending: Internal Medicine

## 2015-06-01 VITALS — BP 135/59 | HR 77

## 2015-06-01 DIAGNOSIS — Z9181 History of falling: Secondary | ICD-10-CM | POA: Diagnosis present

## 2015-06-01 DIAGNOSIS — R531 Weakness: Secondary | ICD-10-CM | POA: Diagnosis present

## 2015-06-01 DIAGNOSIS — M545 Low back pain: Secondary | ICD-10-CM | POA: Insufficient documentation

## 2015-06-01 NOTE — Therapy (Signed)
[facility] [address], [place], [address] Phone: [phone]   Fax:  [phone]  Physical Therapy Evaluation  Patient Details  Name: Sue Knapp MRN: [id number] Date of Birth: Aug 07, 1922 Referring Provider: Rusty Aus, MD  Encounter Date: 06/01/2015      PT End of Session - 06/01/15 1507    Visit Number 1   Number of Visits 9   Date for PT Re-Evaluation 06/30/15   Authorization Type 1   Authorization Time Period of 10    PT Start Time 1507   PT Stop Time 1630   PT Time Calculation (min) 83 min   Equipment Utilized During Treatment Gait belt  SPC on R   Activity Tolerance Patient tolerated treatment well   Behavior During Therapy [facility] for tasks assessed/performed      Past Medical History  Diagnosis Date  . Hypertension   . Cataract   . Hyperlipidemia   . Glaucoma   . Osteoporosis   . Hypothyroidism   . TIA (transient ischemic attack)   . Left bundle branch block   . Hiatal hernia   . Thyroid nodule   . Pacemaker   . Shortness of breath dyspnea   . Heart murmur   . Neuropathy ([place])   . GERD (gastroesophageal reflux disease)   . Depression   . HOH (hard of hearing)     AIDS  . Arthritis   . Scoliosis   . Fatigue   . Heart palpitations   . Breast cancer ([place]) 2013    left breast, radiation and lumpectomy  . Palpitations   . Left ankle injury     Old left ankle injury    Past Surgical History  Procedure Laterality Date  . Thyroidectomy, partial    . Abdominal hysterectomy    . Mastectomy partial / lumpectomy    . Breast surgery      LUMPECTOMY  . Breast biopsy Left 2005    negative  . Breast lumpectomy Left 02/21/2012    positive  . Cataract extraction w/phaco Right 03/01/2015    Procedure: CATARACT EXTRACTION PHACO AND INTRAOCULAR LENS PLACEMENT (IOC);  Surgeon: Birder Robson, MD;  Location: [facility] ORS;  Service: Ophthalmology;  Laterality: Right;   US:1:03.1 AP:22.8 CDE:14.37  Fluid lot # [id number]  . Cataract extraction w/phaco Left 03/22/2015    Procedure: CATARACT EXTRACTION PHACO AND INTRAOCULAR LENS PLACEMENT (IOC);  Surgeon: Birder Robson, MD;  Location: [facility] ORS;  Service: Ophthalmology;  Laterality: Left;  [place]: 01:01.0 AP%: 22.5% CDE: 13.75 Lot # [id number]    Filed Vitals:   06/01/15 1531  BP: 135/59  Pulse: 77    Visit Diagnosis:  Bilateral low back pain, with sciatica presence unspecified - Plan: PT plan of care cert/re-cert  Weakness - Plan: PT plan of care cert/re-cert  Risk for falls - Plan: PT plan of care cert/re-cert      Subjective Assessment - 06/01/15 1509    Subjective Back pain currently 0/10 (pt currently sitting), 5/10 at worst (when waking up in the morning, or sitting greater than 15 - 20 minutes; stiffness)    Pertinent History Pt states that her doctors suggested that physical therapy might help her balance and energy. Pt states wanting to do everything she can to keep her from falling. Had Cataract surgery August, September, October 2016.  Pt states that her doctor did not give her any restrictions with physical activity since her surgery.  Patient also states  having back pain, had an MRI which revealed scoliosis and degenerative spine.   Pt states that  she is not able to work in the yard like she used to or get down onto the floor or stand up like she did.  Pt also states that 2 years ago, her BP significantly decreased which affected her balance. Currently has a pacemaker.  Pt states that she has not fallen and is very careful.  Denies dizziness.  Pt states that she has low energy at times, takes vitamins. Pt states that she eats lots of protein.  Pt also states that October 2015, her blood pressure went to a dangerous level after her quilting class and was sent to the emergency room.  For the past 2-3 years, pt feels like her energy is decreasing. Pt states using a SPC and a rw . Pt denies bowel or  bladder problems or tingling or numbness LE.   Patient Stated Goals Improve balance. Pt also expresses desire to increase energy level.   Currently in Pain? Yes   Pain Score 0-No pain   Aggravating Factors  Digging, stooping, and bending, getting up in the morning, worst in the morning, sitting for prolonged periods   Pain Relieving Factors symptoms improve as the day progresses   Multiple Pain Sites No      Objectives:   There-ex: Directed patient with sit <> stand from regular chair with arms 8x with cues to place and maintain her center of gravity over her base of support (pt states feeling better standing up from a chair afterwards) Adjusted SPC to proper height to promote better back posture during gait. Directed patient with static tandem stance 30 seconds each LE (feet not heel toe position, about 6 inches between feet) with cues to place and maintain body weight between feet.          [facility] PT Assessment - 06/01/15 1534    Assessment   Medical Diagnosis Scoliosis Back pain   Referring Provider Rusty Aus, MD   Onset Date/Surgical Date 05/12/15   Prior Therapy No known physical therapy for current condition   Precautions   Precaution Comments Pacemaker, balance   Restrictions   Other Position/Activity Restrictions no known restrictions   Balance Screen   Has the patient fallen in the past 6 months No   Has the patient had a decrease in activity level because of a fear of falling?  Yes   Is the patient reluctant to leave their home because of a fear of falling?  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 Standing: increased lumbar lordosis at L3/L4, R thoracic side bend, R shoulder lower, bilaterally  protracted shoulders   AROM   Lumbar Flexion full with slight L trunk rotation and with low back discomfort.    Lumbar Extension WFL with low back slipping sensation   Lumbar - Right Side [facility] with low back discomfort   Lumbar - Left Side [facility] with low back discomfort   Lumbar - Right Rotation WFL   Lumbar - Left Rotation [facility]   Strength   Right Hip Flexion 4/5   Right Hip Extension 4-/5   Right Hip External Rotation  3+/5  Right Hip ABduction 4/5   Left Hip Flexion 4+/5   Left Hip Extension 4/5   Left Hip External Rotation 3+/5   Left Hip ABduction 4-/5   Right Knee Flexion 4+/5   Right Knee Extension 5/5   Left Knee Flexion 4+/5   Left Knee Extension 5/5   Ambulation/Gait   Gait Comments SPC R side, decreased stance L LE, R pelvic drop during L LE stance phase   Berg Balance Test   Sit to Stand Able to stand  independently using hands   Standing Unsupported Able to stand safely 2 minutes   Sitting with Back Unsupported but Feet Supported on Floor or Stool Able to sit safely and securely 2 minutes   Stand to Sit Controls descent by using hands   Transfers Able to transfer safely, minor use of hands   Standing Unsupported with Eyes Closed Able to stand 10 seconds safely   Standing Ubsupported with Feet Together Able to place feet together independently and stand for 1 minute with supervision   From Standing, Reach Forward with Outstretched Arm Can reach forward >12 cm safely (5")   From Standing Position, Pick up Object from [place] to pick up shoe safely and easily   From Standing Position, Turn to Look Behind Over each Shoulder Looks behind one side only/other side shows less weight shift   Turn 360 Degrees Able to turn 360 degrees safely but slowly   Standing Unsupported, Alternately Place Feet on Step/Stool Able to complete >2 steps/needs minimal assist   Standing Unsupported, One Foot in Front Able to plae foot ahead of the other independently and hold 30 seconds    Standing on One Leg Unable to try or needs assist to prevent fall   Total Score 41   Berg comment: 41/56, fall risk; score suggests need for assistive device                            PT Education - 06/01/15 1837    Education provided Yes   Education Details plan of care, ther-ex   Person(s) Educated Patient   Methods Explanation;Demonstration;Tactile cues;Verbal cues   Comprehension Verbalized understanding;Returned demonstration             PT Long Term Goals - 06/01/15 1644    PT LONG TERM GOAL #1   Title Patient will improve bilateral glute med and max strength by 1/2 MMT grade to promote balance and ability to perform standing tasks.    Time 4   Period Weeks   Status New   PT LONG TERM GOAL #2   Title Patient will improve her Merrilee Jansky Balance Test score to at least 46/56 as a demonstration of improved balance and decreased fall risk.   Time 4   Period Weeks   Status New   PT LONG TERM GOAL #3   Title Patient will report decreased difficulty getting out of her bed in the morning from her back stiffness to promote mobility.    Time 4   Period Weeks   Status New   PT LONG TERM GOAL #4   Title Patient will be able to pick up 5 lbs from the floor at least 8x total to promote ability to perform tasks in her garden.    Time 4   Period Weeks   Status New               Plan - 06/01/15 1638  Clinical Impression Statement Patient is a 81 year old female who came to physical therapy secondary to back pain and desire to improve her balance. She also presents with altered gait pattern and posture, bilateral hip weakness, decreased balance (based on Berg Balance Test), and difficulty performing tasks involving bending and stooping, as well as getting out of bed in the morning , and standing up from a chair after sitting for long periods. Patient will benefit from skilled physical therapy services to address the aforementioned deficits.    Pt will  benefit from skilled therapeutic intervention in order to improve on the following deficits Postural dysfunction;Decreased strength;Decreased balance;Pain   Rehab Potential Good   Clinical Impairments Affecting Rehab Potential age, multiple health problems   PT Frequency 2x / week   PT Duration 4 weeks   PT Treatment/Interventions Manual techniques;Therapeutic exercise;Therapeutic activities;Gait training;Patient/family education   PT Next Visit Plan L side bending, thoracic extension glute med and max strengthening, balance activities   Consulted and Agree with Plan of Care Patient          G-Codes - June 03, 2015 1651    Functional Assessment Tool Used Berg Balance Test, patient interview, clinical presentation   Functional Limitation Mobility: Walking and moving around   Mobility: Walking and Moving Around Current Status [phone]) At least 20 percent but less than 40 percent impaired, limited or restricted   Mobility: Walking and Moving Around Goal Status [phone]) At least 1 percent but less than 20 percent impaired, limited or restricted       Problem List Patient Active Problem List   Diagnosis Date Noted  . Breast cancer ([place]) 02/02/2015   Thank you for your referral.   Joneen Boers PT, DPT   03-Jun-2015, 6:42 PM  [facility] [address] [place], [address] Phone: [phone]   Fax:  [phone]  Name: MALIYA MARICH MRN: [id number] Date of Birth: 1922-11-03

## 2015-06-09 ENCOUNTER — Ambulatory Visit: Payer: Medicare Other

## 2015-06-09 DIAGNOSIS — M545 Low back pain: Secondary | ICD-10-CM

## 2015-06-09 DIAGNOSIS — Z9181 History of falling: Secondary | ICD-10-CM

## 2015-06-09 DIAGNOSIS — R531 Weakness: Secondary | ICD-10-CM

## 2015-06-09 NOTE — Patient Instructions (Addendum)
Knee Roll    Lying on back, with knees bent and feet flat on bed, arms outstretched to sides, slowly roll both knees to side, hold 5 seconds. Back to starting position, hold 5 seconds. Then to opposite side, hold 5 seconds. Return to starting position. Keep shoulders and arms in contact with bed. Perform 10x each side for 3 sets daily   Copyright  VHI. All rights reserved.   Seated Stretch: Side Bend     Stay on your chair. With feet on floor and buttocks firmly on chair, slowly slide left arm toward floor to feel a stretch on your right low back. Hold _30 seconds___ seconds. Repeat to other side. Repeat __5_ times. Do __3__ sessions per day.   [email or web address].[email or web address]   Copyright  VHI. All rights reserved.

## 2015-06-09 NOTE — Therapy (Signed)
[facility] [address] [place], [address] Phone: [phone]   Fax:  [phone]  Physical Therapy Treatment  Patient Details  Name: Sue Knapp MRN: [id number] Date of Birth: 13-May-1923 Referring Provider: Rusty Aus, MD  Encounter Date: 06/09/2015      PT End of Session - 06/09/15 0918    Visit Number 2   Number of Visits 9   Date for PT Re-Evaluation 06/30/15   Authorization Type 2   Authorization Time Period of 10    PT Start Time 0916   PT Stop Time 1002   PT Time Calculation (min) 46 min   Equipment Utilized During Treatment Gait belt  SPC on R   Activity Tolerance Patient tolerated treatment well   Behavior During Therapy [facility] for tasks assessed/performed      Past Medical History  Diagnosis Date  . Hypertension   . Cataract   . Hyperlipidemia   . Glaucoma   . Osteoporosis   . Hypothyroidism   . TIA (transient ischemic attack)   . Left bundle branch block   . Hiatal hernia   . Thyroid nodule   . Pacemaker   . Shortness of breath dyspnea   . Heart murmur   . Neuropathy ([place])   . GERD (gastroesophageal reflux disease)   . Depression   . HOH (hard of hearing)     AIDS  . Arthritis   . Scoliosis   . Fatigue   . Heart palpitations   . Breast cancer ([place]) 2013    left breast, radiation and lumpectomy  . Palpitations   . Left ankle injury     Old left ankle injury    Past Surgical History  Procedure Laterality Date  . Thyroidectomy, partial    . Abdominal hysterectomy    . Mastectomy partial / lumpectomy    . Breast surgery      LUMPECTOMY  . Breast biopsy Left 2005    negative  . Breast lumpectomy Left 02/21/2012    positive  . Cataract extraction w/phaco Right 03/01/2015    Procedure: CATARACT EXTRACTION PHACO AND INTRAOCULAR LENS PLACEMENT (IOC);  Surgeon: Birder Robson, MD;  Location: [facility] ORS;  Service: Ophthalmology;  Laterality: Right;   US:1:03.1 AP:22.8 CDE:14.37  Fluid lot # [id number]  . Cataract extraction w/phaco Left 03/22/2015    Procedure: CATARACT EXTRACTION PHACO AND INTRAOCULAR LENS PLACEMENT (IOC);  Surgeon: Birder Robson, MD;  Location: [facility] ORS;  Service: Ophthalmology;  Laterality: Left;  [place]: 01:01.0 AP%: 22.5% CDE: 13.75 Lot # [id number]    There were no vitals filed for this visit.  Visit Diagnosis:  Bilateral low back pain, with sciatica presence unspecified  Weakness  Risk for falls      Subjective Assessment - 06/09/15 0921    Subjective 2/10 R low back pain currently. Back usually bothers her early in the morning but eases as the day progresses when she moves. Moving the SPC up by one notch helped.    Pertinent History Pt states that her doctors suggested that physical therapy might help her balance and energy. Pt states wanting to do everything she can to keep her from falling. Had Cataract surgery August, September, October 2016.  Pt states that her doctor did not give her any restrictions with physical activity since her surgery.  Patient also states having back pain, had an MRI which revealed scoliosis and degenerative spine.   Pt states that  she is not  able to work in the yard like she used to or get down onto the floor or stand up like she did.  Pt also states that 2 years ago, her BP significantly decreased which affected her balance. Currently has a pacemaker.  Pt states that she has not fallen and is very careful.  Denies dizziness.  Pt states that she has low energy at times, takes vitamins. Pt states that she eats lots of protein.  Pt also states that October 2015, her blood pressure went to a dangerous level after her quilting class and was sent to the emergency room.  For the past 2-3 years, pt feels like her energy is decreasing. Pt states using a SPC and a rw . Pt denies bowel or bladder problems or tingling or numbness LE.   Patient Stated Goals Improve balance. Pt also expresses desire  to increase energy level.   Currently in Pain? Yes   Pain Score 2    Multiple Pain Sites No    Pt also adds that she notices that she cannot get down and up from the ground without assistance anymore when she gardens. She also states that her R knee buckles at times after standing up in the morning.   Objectives:  There-ex Directed patient with supine clam shells yellow band 10x5 seconds, red band  10x5 seconds, green band 2x10 with 5 second holds  Supine lower trunk rotation 10x2 each side (alleviated back pain) Prone glute max/quad set 10x5 seconds 2 sets  Seated L trunk side bend (to stretch R side) 30 seconds x 5, Standing mini squats with finger touch assist 10x3 Standing L shoulder adduction resisting red band 10x5 seconds   Reviewed HEP. Please see pt instructions. Pt demonstrated and verbalized understanding.    Improved exercise technique, movement at target joints, use of target muscles after mod verbal, visual, tactile cues.     Decreased back pain after exercises that promote lumbar mobility. Continue working on decreasing low back pain, increasing LE strength and balance.                          PT Education - 06/09/15 [phone]    Education provided Yes   Education Details ther-ex, HEP   Person(s) Educated Patient   Methods Explanation;Demonstration;Tactile cues;Verbal cues;Handout   Comprehension Verbalized understanding;Returned demonstration             PT Long Term Goals - 06/01/15 1644    PT LONG TERM GOAL #1   Title Patient will improve bilateral glute med and max strength by 1/2 MMT grade to promote balance and ability to perform standing tasks.    Time 4   Period Weeks   Status New   PT LONG TERM GOAL #2   Title Patient will improve her Merrilee Jansky Balance Test score to at least 46/56 as a demonstration of improved balance and decreased fall risk.   Time 4   Period Weeks   Status New   PT LONG TERM GOAL #3   Title Patient will report  decreased difficulty getting out of her bed in the morning from her back stiffness to promote mobility.    Time 4   Period Weeks   Status New   PT LONG TERM GOAL #4   Title Patient will be able to pick up 5 lbs from the floor at least 8x total to promote ability to perform tasks in her garden.    Time 4  Period Weeks   Status New               Plan - 06/09/15 0924    Clinical Impression Statement Decreased back pain after exercises that promote lumbar mobility. Continue working on decreasing low back pain, increasing LE strength and balance.   Pt will benefit from skilled therapeutic intervention in order to improve on the following deficits Postural dysfunction;Decreased strength;Decreased balance;Pain   Rehab Potential Good   Clinical Impairments Affecting Rehab Potential age, multiple health problems   PT Frequency 2x / week   PT Duration 4 weeks   PT Treatment/Interventions Manual techniques;Therapeutic exercise;Therapeutic activities;Gait training;Patient/family education   PT Next Visit Plan L side bending, thoracic extension glute med and max strengthening, balance activities   Consulted and Agree with Plan of Care Patient        Problem List Patient Active Problem List   Diagnosis Date Noted  . Breast cancer ([place]) 02/02/2015    Joneen Boers PT, DPT   06/09/2015, 7:33 PM  [place] [facility] [address], [place], [address] Phone: [phone]   Fax:  [phone]  Name: Sue Knapp MRN: [id number] Date of Birth: [birth year]

## 2015-06-15 ENCOUNTER — Ambulatory Visit: Payer: Medicare Other

## 2015-06-15 DIAGNOSIS — M545 Low back pain: Secondary | ICD-10-CM

## 2015-06-15 DIAGNOSIS — R531 Weakness: Secondary | ICD-10-CM

## 2015-06-15 DIAGNOSIS — Z9181 History of falling: Secondary | ICD-10-CM

## 2015-06-15 NOTE — Therapy (Signed)
[facility] [address] [place], [address] Phone: [phone]   Fax:  [phone]  Physical Therapy Treatment  Patient Details  Name: Sue Knapp MRN: [id number] Date of Birth: February 18, 1923 Referring Provider: Rusty Aus, MD  Encounter Date: 06/15/2015      PT End of Session - 06/15/15 1033    Visit Number 3   Number of Visits 9   Date for PT Re-Evaluation 06/30/15   Authorization Type 3   Authorization Time Period of 10    PT Start Time 1033   PT Stop Time 1115   PT Time Calculation (min) 42 min   Equipment Utilized During Treatment --  SPC on R   Activity Tolerance Patient tolerated treatment well   Behavior During Therapy [facility] for tasks assessed/performed      Past Medical History  Diagnosis Date  . Hypertension   . Cataract   . Hyperlipidemia   . Glaucoma   . Osteoporosis   . Hypothyroidism   . TIA (transient ischemic attack)   . Left bundle branch block   . Hiatal hernia   . Thyroid nodule   . Pacemaker   . Shortness of breath dyspnea   . Heart murmur   . Neuropathy ([place])   . GERD (gastroesophageal reflux disease)   . Depression   . HOH (hard of hearing)     AIDS  . Arthritis   . Scoliosis   . Fatigue   . Heart palpitations   . Breast cancer ([place]) 2013    left breast, radiation and lumpectomy  . Palpitations   . Left ankle injury     Old left ankle injury    Past Surgical History  Procedure Laterality Date  . Thyroidectomy, partial    . Abdominal hysterectomy    . Mastectomy partial / lumpectomy    . Breast surgery      LUMPECTOMY  . Breast biopsy Left 2005    negative  . Breast lumpectomy Left 02/21/2012    positive  . Cataract extraction w/phaco Right 03/01/2015    Procedure: CATARACT EXTRACTION PHACO AND INTRAOCULAR LENS PLACEMENT (IOC);  Surgeon: Birder Robson, MD;  Location: [facility] ORS;  Service: Ophthalmology;  Laterality: Right;   US:1:03.1 AP:22.8 CDE:14.37  Fluid lot # [id number]  . Cataract extraction w/phaco Left 03/22/2015    Procedure: CATARACT EXTRACTION PHACO AND INTRAOCULAR LENS PLACEMENT (IOC);  Surgeon: Birder Robson, MD;  Location: [facility] ORS;  Service: Ophthalmology;  Laterality: Left;  [place]: 01:01.0 AP%: 22.5% CDE: 13.75 Lot # [id number]    There were no vitals filed for this visit.  Visit Diagnosis:  Bilateral low back pain, with sciatica presence unspecified  Weakness  Risk for falls      Subjective Assessment - 06/15/15 1037    Subjective Pt states R shoulder and bilateral thighs were sore after last session. No back pain currently. Had a low energy week as well. Had a lot to do getting ready for family coming over for Thanksgiving. No back pain currently.    Pertinent History Pt states that her doctors suggested that physical therapy might help her balance and energy. Pt states wanting to do everything she can to keep her from falling. Had Cataract surgery August, September, October 2016.  Pt states that her doctor did not give her any restrictions with physical activity since her surgery.  Patient also states having back pain, had an MRI which revealed scoliosis and degenerative spine.  Pt states that  she is not able to work in the yard like she used to or get down onto the floor or stand up like she did.  Pt also states that 2 years ago, her BP significantly decreased which affected her balance. Currently has a pacemaker.  Pt states that she has not fallen and is very careful.  Denies dizziness.  Pt states that she has low energy at times, takes vitamins. Pt states that she eats lots of protein.  Pt also states that October 2015, her blood pressure went to a dangerous level after her quilting class and was sent to the emergency room.  For the past 2-3 years, pt feels like her energy is decreasing. Pt states using a SPC and a rw . Pt denies bowel or bladder problems or tingling or numbness LE.   Patient  Stated Goals Improve balance. Pt also expresses desire to increase energy level.   Currently in Pain? No/denies   Pain Score 0-No pain   Multiple Pain Sites No     Objectives:    There-ex Directed patient with supine lower trunk rotation 10x2 with 5 second holds  each side, Supine bridge with green band resisting hip abduction/ER 5x Supine clam shells resisting green band 10x2 with 5 second holds Seated L trunk side bend (to stretch R side) 30 seconds x 5, Standing mini squats using TRX strap 10x2 Side stepping with bilateral UE assist from treadmill bars 4 ft x 5 each direction Standing hip abduction 10x3 each LE  Standing alternate toe taps onto first regular step 10x3 each LE (to promote balance)    Improved exercise technique, movement at target joints, use of target muscles, placing center of gravity over base of support after mod verbal, visual, tactile cues.    No complain of back pain during session. Some difficulty with balance with standing alternating toe taps without UE assist, LOB a few times, min A from PT to recover.                              PT Education - 06/15/15 1039    Education provided Yes   Education Details ther-ex   [employer]) Educated Patient   Methods Explanation;Demonstration;Tactile cues;Verbal cues   Comprehension Verbalized understanding;Returned demonstration             PT Long Term Goals - 06/01/15 1644    PT LONG TERM GOAL #1   Title Patient will improve bilateral glute med and max strength by 1/2 MMT grade to promote balance and ability to perform standing tasks.    Time 4   Period Weeks   Status New   PT LONG TERM GOAL #2   Title Patient will improve her Merrilee Jansky Balance Test score to at least 46/56 as a demonstration of improved balance and decreased fall risk.   Time 4   Period Weeks   Status New   PT LONG TERM GOAL #3   Title Patient will report decreased difficulty getting out of her bed in the  morning from her back stiffness to promote mobility.    Time 4   Period Weeks   Status New   PT LONG TERM GOAL #4   Title Patient will be able to pick up 5 lbs from the floor at least 8x total to promote ability to perform tasks in her garden.    Time 4   Period Weeks   Status New  Plan - 06/15/15 1039    Clinical Impression Statement No complain of back pain during session. Some difficulty with balance with standing alternating toe taps without UE assist, LOB a few times, min A from PT to recover.    Pt will benefit from skilled therapeutic intervention in order to improve on the following deficits Postural dysfunction;Decreased strength;Decreased balance;Pain   Rehab Potential Good   Clinical Impairments Affecting Rehab Potential age, multiple health problems   PT Frequency 2x / week   PT Duration 4 weeks   PT Treatment/Interventions Manual techniques;Therapeutic exercise;Therapeutic activities;Gait training;Patient/family education   PT Next Visit Plan L side bending, thoracic extension glute med and max strengthening, balance activities   Consulted and Agree with Plan of Care Patient        Problem List Patient Active Problem List   Diagnosis Date Noted  . Breast cancer ([place]) 02/02/2015    Joneen Boers PT, DPT   06/15/2015, 11:31 AM  [facility] [address] [place], [address] Phone: [phone]   Fax:  [phone]  Name: TIA BEDWELL MRN: [id number] Date of Birth: 27-Mar-1923

## 2015-06-20 ENCOUNTER — Ambulatory Visit: Payer: Medicare Other

## 2015-06-20 DIAGNOSIS — Z9181 History of falling: Secondary | ICD-10-CM

## 2015-06-20 DIAGNOSIS — R531 Weakness: Secondary | ICD-10-CM

## 2015-06-20 DIAGNOSIS — M545 Low back pain: Secondary | ICD-10-CM | POA: Diagnosis not present

## 2015-06-20 NOTE — Therapy (Signed)
[facility] [address], [place], [address] Phone: [phone]   Fax:  [phone]  Physical Therapy Treatment  Patient Details  Name: Sue Knapp MRN: [id number] Date of Birth: Dec 13, 1922 Referring Provider: Rusty Aus, MD  Encounter Date: 06/20/2015      PT End of Session - 06/20/15 1348    Visit Number 4   Number of Visits 9   Date for PT Re-Evaluation 06/30/15   Authorization Type 4   Authorization Time Period of 10    PT Start Time 1348   PT Stop Time 1430   PT Time Calculation (min) 42 min   Equipment Utilized During Treatment --  SPC on R   Activity Tolerance Patient tolerated treatment well   Behavior During Therapy [facility] for tasks assessed/performed      Past Medical History  Diagnosis Date  . Hypertension   . Cataract   . Hyperlipidemia   . Glaucoma   . Osteoporosis   . Hypothyroidism   . TIA (transient ischemic attack)   . Left bundle branch block   . Hiatal hernia   . Thyroid nodule   . Pacemaker   . Shortness of breath dyspnea   . Heart murmur   . Neuropathy ([place])   . GERD (gastroesophageal reflux disease)   . Depression   . HOH (hard of hearing)     AIDS  . Arthritis   . Scoliosis   . Fatigue   . Heart palpitations   . Breast cancer ([place]) 2013    left breast, radiation and lumpectomy  . Palpitations   . Left ankle injury     Old left ankle injury    Past Surgical History  Procedure Laterality Date  . Thyroidectomy, partial    . Abdominal hysterectomy    . Mastectomy partial / lumpectomy    . Breast surgery      LUMPECTOMY  . Breast biopsy Left 2005    negative  . Breast lumpectomy Left 02/21/2012    positive  . Cataract extraction w/phaco Right 03/01/2015    Procedure: CATARACT EXTRACTION PHACO AND INTRAOCULAR LENS PLACEMENT (IOC);  Surgeon: Birder Robson, MD;  Location: [facility] ORS;  Service: Ophthalmology;  Laterality: Right;   US:1:03.1 AP:22.8 CDE:14.37  Fluid lot # [id number]  . Cataract extraction w/phaco Left 03/22/2015    Procedure: CATARACT EXTRACTION PHACO AND INTRAOCULAR LENS PLACEMENT (IOC);  Surgeon: Birder Robson, MD;  Location: [facility] ORS;  Service: Ophthalmology;  Laterality: Left;  [place]: 01:01.0 AP%: 22.5% CDE: 13.75 Lot # [id number]    There were no vitals filed for this visit.  Visit Diagnosis:  Bilateral low back pain, with sciatica presence unspecified  Weakness  Risk for falls      Subjective Assessment - 06/20/15 1350    Subjective Was not as sore after last session. No pain in her back currently.    Pertinent History Pt states that her doctors suggested that physical therapy might help her balance and energy. Pt states wanting to do everything she can to keep her from falling. Had Cataract surgery August, September, October 2016.  Pt states that her doctor did not give her any restrictions with physical activity since her surgery.  Patient also states having back pain, had an MRI which revealed scoliosis and degenerative spine.   Pt states that  she is not able to work in the yard like she used to or get down onto the floor or stand up  like she did.  Pt also states that 2 years ago, her BP significantly decreased which affected her balance. Currently has a pacemaker.  Pt states that she has not fallen and is very careful.  Denies dizziness.  Pt states that she has low energy at times, takes vitamins. Pt states that she eats lots of protein.  Pt also states that October 2015, her blood pressure went to a dangerous level after her quilting class and was sent to the emergency room.  For the past 2-3 years, pt feels like her energy is decreasing. Pt states using a SPC and a rw . Pt denies bowel or bladder problems or tingling or numbness LE.   Patient Stated Goals Improve balance. Pt also expresses desire to increase energy level.   Currently in Pain? No/denies   Multiple Pain Sites No       Objectives:    There-ex Directed patient with supine clam shells resisting green band 10x3 with 5 second holds Supine lower trunk rotation 10x5 seconds each side Standing mini squats using TRX strap 10x2 (R knee crepitus palpated; hold off next visit)  Standing hip abduction 10x2 each LE with bilateral UE assist  Standing alternate toe taps onto first regular step 10x3 each LE (to promote balance) Seated L trunk side bend (to stretch R side) 30 seconds x 5, Side stepping without use of AD 32 ft x 2 with CGA   Improved exercise technique, movement at target joints, use of target muscles after mod verbal, visual, tactile cues.     Better able to perform standing alternating toe taps compared to last session. Slight R knee discomfort with side stepping to the left with eased at second set. Hold off on squat related exercises.                           PT Education - 06/20/15 1353    Education provided Yes   Education Details ther-ex   [employer]) Educated Patient   Methods Explanation;Demonstration;Tactile cues;Verbal cues   Comprehension Verbalized understanding;Returned demonstration             PT Long Term Goals - 06/01/15 1644    PT LONG TERM GOAL #1   Title Patient will improve bilateral glute med and max strength by 1/2 MMT grade to promote balance and ability to perform standing tasks.    Time 4   Period Weeks   Status New   PT LONG TERM GOAL #2   Title Patient will improve her Merrilee Jansky Balance Test score to at least 46/56 as a demonstration of improved balance and decreased fall risk.   Time 4   Period Weeks   Status New   PT LONG TERM GOAL #3   Title Patient will report decreased difficulty getting out of her bed in the morning from her back stiffness to promote mobility.    Time 4   Period Weeks   Status New   PT LONG TERM GOAL #4   Title Patient will be able to pick up 5 lbs from the floor at least 8x total to promote ability to perform  tasks in her garden.    Time 4   Period Weeks   Status New               Plan - 06/20/15 1354    Clinical Impression Statement Better able to perform standing alternating toe taps compared to last session. Slight R knee discomfort with  side stepping to the left with eased at second set. Hold off on squat related exercises.    Pt will benefit from skilled therapeutic intervention in order to improve on the following deficits Postural dysfunction;Decreased strength;Decreased balance;Pain   Rehab Potential Good   Clinical Impairments Affecting Rehab Potential age, multiple health problems   PT Frequency 2x / week   PT Duration 4 weeks   PT Treatment/Interventions Manual techniques;Therapeutic exercise;Therapeutic activities;Gait training;Patient/family education   PT Next Visit Plan L side bending, thoracic extension glute med and max strengthening, balance activities   Consulted and Agree with Plan of Care Patient        Problem List Patient Active Problem List   Diagnosis Date Noted  . Breast cancer ([place]) 02/02/2015    Joneen Boers PT, DPT   06/20/2015, 7:51 PM  [facility] [address] [place], [address] Phone: [phone]   Fax:  [phone]  Name: Sue Knapp MRN: [id number] Date of Birth: 1922-08-21

## 2015-06-23 ENCOUNTER — Ambulatory Visit: Payer: Medicare Other

## 2015-07-04 ENCOUNTER — Ambulatory Visit: Payer: Medicare Other | Attending: Internal Medicine

## 2015-07-04 DIAGNOSIS — Z9181 History of falling: Secondary | ICD-10-CM

## 2015-07-04 DIAGNOSIS — R531 Weakness: Secondary | ICD-10-CM

## 2015-07-04 DIAGNOSIS — M545 Low back pain: Secondary | ICD-10-CM | POA: Diagnosis present

## 2015-07-04 NOTE — Patient Instructions (Signed)
Gave supine clam shells resisting green band 10x3 daily as part of her HEP. Pt demonstrated and verbalized understanding.  

## 2015-07-04 NOTE — Therapy (Signed)
[facility] [address] [place], [address] Phone: [phone]   Fax:  [phone]  Physical Therapy Treatment And Progress Report  Patient Details  Name: RITAMAE HYDER MRN: [id number] Date of Birth: 1922/10/21 Referring Provider: Rusty Aus, MD  Encounter Date: 07/04/2015      PT End of Session - 07/04/15 1122    Visit Number 5   Number of Visits 9   Date for PT Re-Evaluation 08/04/15   Authorization Type 5   Authorization Time Period of 10    PT Start Time 1122   PT Stop Time 1215   PT Time Calculation (min) 53 min   Equipment Utilized During Treatment --  SPC on R   Activity Tolerance Patient tolerated treatment well   Behavior During Therapy [facility] for tasks assessed/performed      Past Medical History  Diagnosis Date  . Hypertension   . Cataract   . Hyperlipidemia   . Glaucoma   . Osteoporosis   . Hypothyroidism   . TIA (transient ischemic attack)   . Left bundle branch block   . Hiatal hernia   . Thyroid nodule   . Pacemaker   . Shortness of breath dyspnea   . Heart murmur   . Neuropathy ([place])   . GERD (gastroesophageal reflux disease)   . Depression   . HOH (hard of hearing)     AIDS  . Arthritis   . Scoliosis   . Fatigue   . Heart palpitations   . Breast cancer ([place]) 2013    left breast, radiation and lumpectomy  . Palpitations   . Left ankle injury     Old left ankle injury    Past Surgical History  Procedure Laterality Date  . Thyroidectomy, partial    . Abdominal hysterectomy    . Mastectomy partial / lumpectomy    . Breast surgery      LUMPECTOMY  . Breast biopsy Left 2005    negative  . Breast lumpectomy Left 02/21/2012    positive  . Cataract extraction w/phaco Right 03/01/2015    Procedure: CATARACT EXTRACTION PHACO AND INTRAOCULAR LENS PLACEMENT (IOC);  Surgeon: Birder Robson, MD;  Location: [facility] ORS;  Service: Ophthalmology;  Laterality: Right;   US:1:03.1 AP:22.8 CDE:14.37  Fluid lot # [id number]  . Cataract extraction w/phaco Left 03/22/2015    Procedure: CATARACT EXTRACTION PHACO AND INTRAOCULAR LENS PLACEMENT (IOC);  Surgeon: Birder Robson, MD;  Location: [facility] ORS;  Service: Ophthalmology;  Laterality: Left;  [place]: 01:01.0 AP%: 22.5% CDE: 13.75 Lot # [id number]    There were no vitals filed for this visit.  Visit Diagnosis:  Bilateral low back pain, with sciatica presence unspecified - Plan: PT plan of care cert/re-cert  Weakness - Plan: PT plan of care cert/re-cert  Risk for falls - Plan: PT plan of care cert/re-cert      Subjective Assessment - 07/04/15 1124    Subjective Pt states she missed her treatment sessions secondary to bronchitis. Finished antibiotics last week. Feels better but has some chest congestion. Energy is a little low. No back pain currently. When she moves around, it gets better. Sitting for long periods starts back pain again. Pt also adds that she has been using her rw at night when she gets out of bed to go to the bathroom and when she grabs hold of the rw, her back feels better.  Was in bed for 2 days when sick.  Pertinent History Pt states that her doctors suggested that physical therapy might help her balance and energy. Pt states wanting to do everything she can to keep her from falling. Had Cataract surgery August, September, October 2016.  Pt states that her doctor did not give her any restrictions with physical activity since her surgery.  Patient also states having back pain, had an MRI which revealed scoliosis and degenerative spine.   Pt states that  she is not able to work in the yard like she used to or get down onto the floor or stand up like she did.  Pt also states that 2 years ago, her BP significantly decreased which affected her balance. Currently has a pacemaker.  Pt states that she has not fallen and is very careful.  Denies dizziness.  Pt states that she has low energy at times, takes  vitamins. Pt states that she eats lots of protein.  Pt also states that October 2015, her blood pressure went to a dangerous level after her quilting class and was sent to the emergency room.  For the past 2-3 years, pt feels like her energy is decreasing. Pt states using a SPC and a rw . Pt denies bowel or bladder problems or tingling or numbness LE.   Patient Stated Goals Improve balance. Pt also expresses desire to increase energy level.   Currently in Pain? No/denies   Pain Score 0-No pain   Multiple Pain Sites No            [facility] PT Assessment - 07/04/15 1138    Strength   Right Hip Extension 3+/5   Right Hip ABduction 4/5   Left Hip Extension 4/5   Left Hip ABduction 4/5   Berg Balance Test   Sit to Stand Able to stand without using hands and stabilize independently   Standing Unsupported Able to stand safely 2 minutes   Sitting with Back Unsupported but Feet Supported on Floor or Stool Able to sit safely and securely 2 minutes   Stand to Sit Sits safely with minimal use of hands   Transfers Able to transfer safely, minor use of hands   Standing Unsupported with Eyes Closed Able to stand 10 seconds safely   Standing Ubsupported with Feet Together Able to place feet together independently and stand 1 minute safely   From Standing, Reach Forward with Outstretched Arm Can reach forward >12 cm safely (5")   From Standing Position, Pick up Object from Floor Able to pick up shoe safely and easily   From Standing Position, Turn to Look Behind Over each Shoulder Looks behind from both sides and weight shifts well   Turn 360 Degrees Able to turn 360 degrees safely but slowly   Standing Unsupported, Alternately Place Feet on Step/Stool Able to complete >2 steps/needs minimal assist   Standing Unsupported, One Foot in Front Able to plae foot ahead of the other independently and hold 30 seconds   Standing on One Leg Unable to try or needs assist to prevent fall   Total Score 45   Berg  comment: 45/56. Decreased fall risk by 4 points since initial evaluation.         Objectives:  There-ex:  Directed patient with supine clamshells resisting green band 10x3, S/L hip abduction 10x each LE Manually resisted S/L hip abduction, prone glute max extension 1x each way for each LE Reviewed progress/current status with hip strength with pt Sit <> stand without UE assist to min UE assist throughout  session from low surface height (as high as a chair) Mat <> chair transfer 1x Static standing balance: feet together, shoulder width apart, eyes closed, eyes open with one foot in front of the other Turning 360 degrees 1x each direction,  Standing alternating toe taps onto first regular step 4x each LE Picking up and object from the floor (computer mouse) 1x Standing forward reaching,  Standing and turning to look behind her to the R and to the LE  Improved exercise technique, movement at target joints, use of target muscles, placing and maintaining her center of gravity over her base of support after mod verbal, visual, tactile cues.      Patient has demonstrated improved L glute med strength, but slight decrease in R glute max strength since initial evaluation. Pt however improved her Berg Balance score from 41/56 to 45/56 suggesting improved balance. Some difficulty achieving goals secondary to patient being sick since 06/23/15 due to Bronchitis. Pt states feeling better. Patient still demonstrates LE weakness, as well as back symptoms and difficulty performing functional tasks such as getting into and out of bed, and working in her garden. Patient will benefit from skilled physical therapy services to address the aforementioned deficits.                   PT Education - 07/04/15 1130    Education provided Yes   Education Details ther-ex. HEP.   Person(s) Educated Patient   Methods Explanation;Demonstration;Tactile cues;Verbal cues   Comprehension Verbalized  understanding;Returned demonstration             PT Long Term Goals - 07/04/15 1130    PT LONG TERM GOAL #1   Title Patient will improve bilateral glute med and max strength by 1/2 MMT grade to promote balance and ability to perform standing tasks.    Time 4   Period Weeks   Status On-going   PT LONG TERM GOAL #2   Title Patient will improve her Merrilee Jansky Balance Test score to at least 46/56 as a demonstration of improved balance and decreased fall risk.   Baseline Initial evaluation score 41/56. Current score 45/46.   Time 4   Period Weeks   Status On-going   PT LONG TERM GOAL #3   Title Patient will report decreased difficulty getting out of her bed in the morning from her back stiffness to promote mobility.    Time 4   Period Weeks   Status On-going   PT LONG TERM GOAL #4   Title Patient will be able to pick up 5 lbs from the floor at least 8x total to promote ability to perform tasks in her garden.    Time 4   Period Weeks   Status On-going               Plan - 07/04/15 1130    Clinical Impression Statement Patient has demonstrated improved L glute med strength, but slight decrease in R glute max strength since initial evaluation. Pt however improved her Berg Balance score from 41/56 to 45/56 suggesting improved balance. Some difficulty achieving goals secondary to patient being sick since 06/23/15 due to Bronchitis. Pt states feeling better. Patient still demonstrates LE weakness, as well as back symptoms and difficulty performing functional tasks such as getting into and out of bed, and working in her garden. Patient will benefit from skilled physical therapy services to address the aforementioned deficits.    Pt will benefit from skilled therapeutic intervention in order to  improve on the following deficits Postural dysfunction;Decreased strength;Decreased balance;Pain   Rehab Potential Good   Clinical Impairments Affecting Rehab Potential age, multiple health problems    PT Frequency 2x / week   PT Duration 4 weeks   PT Treatment/Interventions Manual techniques;Therapeutic exercise;Therapeutic activities;Gait training;Patient/family education   PT Next Visit Plan L side bending, thoracic extension glute med and max strengthening, balance activities   Consulted and Agree with Plan of Care Patient        Problem List Patient Active Problem List   Diagnosis Date Noted  . Breast cancer ([place]) 02/02/2015    Thank you for your referral.  Joneen Boers PT, DPT   07/04/2015, 8:42 PM  [facility] [address], [place], [address] Phone: [phone]   Fax:  [phone]  Name: KATERINE SIPLE MRN: [id number] Date of Birth: October 23, 1922

## 2015-07-07 ENCOUNTER — Ambulatory Visit: Payer: Medicare Other

## 2015-07-07 DIAGNOSIS — M545 Low back pain: Secondary | ICD-10-CM

## 2015-07-07 DIAGNOSIS — R531 Weakness: Secondary | ICD-10-CM

## 2015-07-07 DIAGNOSIS — Z9181 History of falling: Secondary | ICD-10-CM

## 2015-07-07 NOTE — Patient Instructions (Signed)
Seated R knee extension resisting 5 lb weight while sitting on folded towel under L hip (for more neutral back posture) 10x3 daily as part of her HEP. Pt demonstrated and verbalized understanding.

## 2015-07-07 NOTE — Therapy (Signed)
[facility] [address] [place], [address] Phone: [phone]   Fax:  [phone]  Physical Therapy Treatment  Patient Details  Name: Sue Knapp MRN: [id number] Date of Birth: [birth year] Referring Provider: Rusty Aus, MD  Encounter Date: 07/07/2015      PT End of Session - 07/07/15 1034    Visit Number 6   Number of Visits 9   Date for PT Re-Evaluation 08/04/15   Authorization Type 6   Authorization Time Period of 10    PT Start Time 1034   PT Stop Time 1122   PT Time Calculation (min) 48 min   Equipment Utilized During Treatment --  SPC on R   Activity Tolerance Patient tolerated treatment well   Behavior During Therapy [facility] for tasks assessed/performed      Past Medical History  Diagnosis Date  . Hypertension   . Cataract   . Hyperlipidemia   . Glaucoma   . Osteoporosis   . Hypothyroidism   . TIA (transient ischemic attack)   . Left bundle branch block   . Hiatal hernia   . Thyroid nodule   . Pacemaker   . Shortness of breath dyspnea   . Heart murmur   . Neuropathy ([place])   . GERD (gastroesophageal reflux disease)   . Depression   . HOH (hard of hearing)     AIDS  . Arthritis   . Scoliosis   . Fatigue   . Heart palpitations   . Breast cancer ([place]) 2013    left breast, radiation and lumpectomy  . Palpitations   . Left ankle injury     Old left ankle injury    Past Surgical History  Procedure Laterality Date  . Thyroidectomy, partial    . Abdominal hysterectomy    . Mastectomy partial / lumpectomy    . Breast surgery      LUMPECTOMY  . Breast biopsy Left 2005    negative  . Breast lumpectomy Left 02/21/2012    positive  . Cataract extraction w/phaco Right 03/01/2015    Procedure: CATARACT EXTRACTION PHACO AND INTRAOCULAR LENS PLACEMENT (IOC);  Surgeon: Birder Robson, MD;  Location: [facility] ORS;  Service: Ophthalmology;  Laterality: Right;   US:1:03.1 AP:22.8 CDE:14.37  Fluid lot # [id number]  . Cataract extraction w/phaco Left 03/22/2015    Procedure: CATARACT EXTRACTION PHACO AND INTRAOCULAR LENS PLACEMENT (IOC);  Surgeon: Birder Robson, MD;  Location: [facility] ORS;  Service: Ophthalmology;  Laterality: Left;  [place]: 01:01.0 AP%: 22.5% CDE: 13.75 Lot # [id number]    There were no vitals filed for this visit.  Visit Diagnosis:  Bilateral low back pain, with sciatica presence unspecified  Weakness  Risk for falls      Subjective Assessment - 07/07/15 1039    Subjective Pt states participating in a lot of activities at [facility] yesterday. Tired this morning. No back pain currently. Some R knee discomfort. Last time pt has not used the [employer] was 2.5 years ago. Would like to be able to walk without use of a cane.   Pertinent History Pt states that her doctors suggested that physical therapy might help her balance and energy. Pt states wanting to do everything she can to keep her from falling. Had Cataract surgery August, September, October 2016.  Pt states that her doctor did not give her any restrictions with physical activity since her surgery.  Patient also states having back pain, had an MRI which revealed  scoliosis and degenerative spine.   Pt states that  she is not able to work in the yard like she used to or get down onto the floor or stand up like she did.  Pt also states that 2 years ago, her BP significantly decreased which affected her balance. Currently has a pacemaker.  Pt states that she has not fallen and is very careful.  Denies dizziness.  Pt states that she has low energy at times, takes vitamins. Pt states that she eats lots of protein.  Pt also states that October 2015, her blood pressure went to a dangerous level after her quilting class and was sent to the emergency room.  For the past 2-3 years, pt feels like her energy is decreasing. Pt states using a SPC and a rw . Pt denies bowel or bladder problems or tingling or  numbness LE.   Patient Stated Goals Improve balance. Pt also expresses desire to increase energy level. To walk without using SPC.    Currently in Pain? Yes  R knee discomfort   Pain Score --  No knee pain level provided. No back pain currently.          Objectives:  There-ex Directed patient with standing R and L hip abduction 10x2 each LE with bilateral UE assist Standing heel toe raises 10x3 each way Standing bilateral shoulder extension with scapular retraction resisting yellow band 10x with 5 second holds    Then with red band 10x2 with 5 second holds.  Standing static balance with R foot on Air Ex pad 30 seconds x 2 to promote stability around R knee (slight R knee discomfort afterwards which decreased with sitting rest break) Seated R knee flexion resisting red band 10x3 Seated knee extension resisting 5 lb weight 10x, then sitting on folded towel under L hip (to decrease R lumbar side bend and decrease R lumbar discomfort) 10x2 (reviewed and given as part of her HEP. Pt demonstrated and verbalized understanding).  Seated R heel digs onto floor 10x2 (to promote hamstring strength)  Gait without use of SPC approximately 30 ft. ( R knee discomfort which decreased when pt used SPC)  Improved exercise technique, movement at target joints, use of target muscles after mod verbal, visual, tactile cues.    Tolerated session without aggravation of symptoms. Worked on knee strength and stability today to help decrease R knee pain during standing activities, to help promote balance. Tried ambulating without use of SPC but pt felt increased R knee discomfort. Decreased discomfort when using SPC.                        PT Education - 07/07/15 1043    Education provided Yes   Education Details ther-ex   [employer]) Educated Patient   Methods Explanation;Demonstration;Tactile cues;Verbal cues   Comprehension Verbalized understanding;Returned demonstration              PT Long Term Goals - 07/04/15 1130    PT LONG TERM GOAL #1   Title Patient will improve bilateral glute med and max strength by 1/2 MMT grade to promote balance and ability to perform standing tasks.    Time 4   Period Weeks   Status On-going   PT LONG TERM GOAL #2   Title Patient will improve her Merrilee Jansky Balance Test score to at least 46/56 as a demonstration of improved balance and decreased fall risk.   Baseline Initial evaluation score 41/56. Current score 45/46.  Time 4   Period Weeks   Status On-going   PT LONG TERM GOAL #3   Title Patient will report decreased difficulty getting out of her bed in the morning from her back stiffness to promote mobility.    Time 4   Period Weeks   Status On-going   PT LONG TERM GOAL #4   Title Patient will be able to pick up 5 lbs from the floor at least 8x total to promote ability to perform tasks in her garden.    Time 4   Period Weeks   Status On-going               Plan - 07/07/15 1043    Clinical Impression Statement Tolerated session without aggravation of symptoms. Worked on knee strength and stability today to help decrease R knee pain during standing activities, to help promote balance. Tried ambulating without use of SPC but pt felt increased R knee discomfort. Decreased discomfort when using SPC.    Pt will benefit from skilled therapeutic intervention in order to improve on the following deficits Postural dysfunction;Decreased strength;Decreased balance;Pain   Rehab Potential Good   Clinical Impairments Affecting Rehab Potential age, multiple health problems   PT Frequency 2x / week   PT Duration 4 weeks   PT Treatment/Interventions Manual techniques;Therapeutic exercise;Therapeutic activities;Gait training;Patient/family education   PT Next Visit Plan L side bending, thoracic extension glute med and max strengthening, balance activities   Consulted and Agree with Plan of Care Patient        Problem List Patient Active  Problem List   Diagnosis Date Noted  . Breast cancer ([place]) 02/02/2015    Joneen Boers PT, DPT   07/07/2015, 11:40 AM  [facility] [address] [place], [address] Phone: [phone]   Fax:  [phone]  Name: Sue Knapp MRN: [id number] Date of Birth: 02-19-1923

## 2015-07-11 ENCOUNTER — Ambulatory Visit: Payer: Medicare Other

## 2015-07-11 DIAGNOSIS — Z9181 History of falling: Secondary | ICD-10-CM

## 2015-07-11 DIAGNOSIS — R531 Weakness: Secondary | ICD-10-CM

## 2015-07-11 DIAGNOSIS — M545 Low back pain: Secondary | ICD-10-CM

## 2015-07-11 NOTE — Therapy (Signed)
[facility] [address], [place], [address] Phone: [phone]   Fax:  [phone]  Physical Therapy Treatment  Patient Details  Name: NASREEN GARRAND MRN: [id number] Date of Birth: 09-22-1922 Referring Provider: Rusty Aus, MD  Encounter Date: 07/11/2015      PT End of Session - 07/11/15 1127    Visit Number 7   Number of Visits 17  8 additional visits from [id number] recert   Date for PT Re-Evaluation 08/04/15   Authorization Type 7   Authorization Time Period of 10    PT Start Time 1128  Patient arrived late.    PT Stop Time 1213   PT Time Calculation (min) 45 min   Equipment Utilized During Treatment --  SPC on R   Activity Tolerance Patient tolerated treatment well   Behavior During Therapy WFL for tasks assessed/performed      Past Medical History  Diagnosis Date  . Hypertension   . Cataract   . Hyperlipidemia   . Glaucoma   . Osteoporosis   . Hypothyroidism   . TIA (transient ischemic attack)   . Left bundle branch block   . Hiatal hernia   . Thyroid nodule   . Pacemaker   . Shortness of breath dyspnea   . Heart murmur   . Neuropathy ([place])   . GERD (gastroesophageal reflux disease)   . Depression   . HOH (hard of hearing)     AIDS  . Arthritis   . Scoliosis   . Fatigue   . Heart palpitations   . Breast cancer ([place]) 2013    left breast, radiation and lumpectomy  . Palpitations   . Left ankle injury     Old left ankle injury    Past Surgical History  Procedure Laterality Date  . Thyroidectomy, partial    . Abdominal hysterectomy    . Mastectomy partial / lumpectomy    . Breast surgery      LUMPECTOMY  . Breast biopsy Left 2005    negative  . Breast lumpectomy Left 02/21/2012    positive  . Cataract extraction w/phaco Right 03/01/2015    Procedure: CATARACT EXTRACTION PHACO AND INTRAOCULAR LENS PLACEMENT (IOC);  Surgeon: Birder Robson, MD;  Location: [facility] ORS;   Service: Ophthalmology;  Laterality: Right;  US:1:03.1 AP:22.8 CDE:14.37  Fluid lot # [id number]  . Cataract extraction w/phaco Left 03/22/2015    Procedure: CATARACT EXTRACTION PHACO AND INTRAOCULAR LENS PLACEMENT (IOC);  Surgeon: Birder Robson, MD;  Location: [facility] ORS;  Service: Ophthalmology;  Laterality: Left;  [place]: 01:01.0 AP%: 22.5% CDE: 13.75 Lot # [id number]    There were no vitals filed for this visit.  Visit Diagnosis:  Bilateral low back pain, with sciatica presence unspecified  Weakness  Risk for falls      Subjective Assessment - 07/11/15 1130    Subjective Slow getting started this morning. No back pain currently. R knee is ok. No additional problem with it.    Pertinent History Pt states that her doctors suggested that physical therapy might help her balance and energy. Pt states wanting to do everything she can to keep her from falling. Had Cataract surgery August, September, October 2016.  Pt states that her doctor did not give her any restrictions with physical activity since her surgery.  Patient also states having back pain, had an MRI which revealed scoliosis and degenerative spine.   Pt states that  she is not able to  work in the yard like she used to or get down onto the floor or stand up like she did.  Pt also states that 2 years ago, her BP significantly decreased which affected her balance. Currently has a pacemaker.  Pt states that she has not fallen and is very careful.  Denies dizziness.  Pt states that she has low energy at times, takes vitamins. Pt states that she eats lots of protein.  Pt also states that October 2015, her blood pressure went to a dangerous level after her quilting class and was sent to the emergency room.  For the past 2-3 years, pt feels like her energy is decreasing. Pt states using a SPC and a rw . Pt denies bowel or bladder problems or tingling or numbness LE.   Patient Stated Goals Improve balance. Pt also expresses desire to increase  energy level. To walk without using SPC.    Currently in Pain? No/denies   Multiple Pain Sites No        Objectives:  Standing posture: Bilateral foot pronation L > R. Pt states she broke her L ankle years ago.  Pt also adds feeling "out of sorts" this morning, not being able to put a finger on it. Blood pressure measured.  149/71, HR 79  Manual therapy:   Low dye tape to R and L foot to support bilateral arches. L foot felt better with gait. R knee no difference, slight increase in discomfort therefore tape removed from R foot.    There-ex:   Vitals obtained Directed patient with static standing without UE assist gait throughout gym (after low dye tape) with SPC and without use of SPC. Medial knee pain during push-off with R LE. Decreased L foot discomfort.  Seated manually resisted R knee flexion 10x, then 2x5 targeting lateral hamstrings, No change in R knee pain during gait.  Pt education on arch support and flat feet and recommended pt to get arch support for L foot. Pt verbalized understanding.   Improved exercise technique, movement at target joints, use of target muscles after mod verbal, visual, tactile cues.      Improved L foot and ankle comfort with low dye tape to support arch. Slight increase in R knee discomfort during gait after tape to support R plantar arch which decreased when tape was removed. Pt was recommended to use arch support for her L foot to promote gait with less L foot discomfort. Pt verbalized understanding.                    PT Education - 07/11/15 1240    Education provided Yes   Education Details ther-ex. arch supports   Person(s) Educated Patient   Methods Explanation;Demonstration;Verbal cues   Comprehension Verbalized understanding;Returned demonstration             PT Long Term Goals - 07/04/15 1130    PT LONG TERM GOAL #1   Title Patient will improve bilateral glute med and max strength by 1/2 MMT grade to promote  balance and ability to perform standing tasks.    Time 4   Period Weeks   Status On-going   PT LONG TERM GOAL #2   Title Patient will improve her Merrilee Jansky Balance Test score to at least 46/56 as a demonstration of improved balance and decreased fall risk.   Baseline Initial evaluation score 41/56. Current score 45/46.   Time 4   Period Weeks   Status On-going   PT LONG TERM  GOAL #3   Title Patient will report decreased difficulty getting out of her bed in the morning from her back stiffness to promote mobility.    Time 4   Period Weeks   Status On-going   PT LONG TERM GOAL #4   Title Patient will be able to pick up 5 lbs from the floor at least 8x total to promote ability to perform tasks in her garden.    Time 4   Period Weeks   Status On-going               Plan - 07/11/15 1241    Clinical Impression Statement Improved L foot and ankle comfort with low dye tape to support arch. Slight increase in R knee discomfort during gait after tape to support R plantar arch which decreased when tape was removed. Pt was recommended to use arch support for her L foot to promote gait with less L foot discomfort. Pt verbalized understanding.   Pt will benefit from skilled therapeutic intervention in order to improve on the following deficits Postural dysfunction;Decreased strength;Decreased balance;Pain   Rehab Potential Good   Clinical Impairments Affecting Rehab Potential age, multiple health problems   PT Frequency 2x / week   PT Duration 4 weeks   PT Treatment/Interventions Manual techniques;Therapeutic exercise;Therapeutic activities;Gait training;Patient/family education   PT Next Visit Plan L side bending, thoracic extension glute med and max strengthening, balance activities   Consulted and Agree with Plan of Care Patient        Problem List Patient Active Problem List   Diagnosis Date Noted  . Breast cancer [facility]) 02/02/2015    Joneen Boers PT, DPT   07/11/2015, 7:08  PM  [facility] [address], [place], [address] Phone: [phone]   Fax:  [phone]  Name: BLANCA SINQUEFIELD MRN: [id number] Date of Birth: 1923/01/17

## 2015-07-14 ENCOUNTER — Ambulatory Visit: Payer: Medicare Other

## 2015-07-14 DIAGNOSIS — M545 Low back pain: Secondary | ICD-10-CM | POA: Diagnosis not present

## 2015-07-14 DIAGNOSIS — R531 Weakness: Secondary | ICD-10-CM

## 2015-07-14 DIAGNOSIS — Z9181 History of falling: Secondary | ICD-10-CM

## 2015-07-14 NOTE — Therapy (Signed)
[facility] [address] [place], [address] Phone: [phone]   Fax:  [phone]  Physical Therapy Treatment  Patient Details  Name: Sue Knapp MRN: [id number] Date of Birth: Apr 06, 1923 Referring Provider: Rusty Aus, MD  Encounter Date: 07/14/2015      PT End of Session - 07/14/15 1125    Visit Number 8   Number of Visits 17  8 additional visits from [id number] recert   Date for PT Re-Evaluation 08/04/15   Authorization Type 8   Authorization Time Period of 10    PT Start Time 1124   PT Stop Time 1212   PT Time Calculation (min) 48 min   Equipment Utilized During Treatment --  SPC on R   Activity Tolerance Patient tolerated treatment well   Behavior During Therapy [facility] for tasks assessed/performed      Past Medical History  Diagnosis Date  . Hypertension   . Cataract   . Hyperlipidemia   . Glaucoma   . Osteoporosis   . Hypothyroidism   . TIA (transient ischemic attack)   . Left bundle branch block   . Hiatal hernia   . Thyroid nodule   . Pacemaker   . Shortness of breath dyspnea   . Heart murmur   . Neuropathy ([place])   . GERD (gastroesophageal reflux disease)   . Depression   . HOH (hard of hearing)     AIDS  . Arthritis   . Scoliosis   . Fatigue   . Heart palpitations   . Breast cancer ([place]) 2013    left breast, radiation and lumpectomy  . Palpitations   . Left ankle injury     Old left ankle injury    Past Surgical History  Procedure Laterality Date  . Thyroidectomy, partial    . Abdominal hysterectomy    . Mastectomy partial / lumpectomy    . Breast surgery      LUMPECTOMY  . Breast biopsy Left 2005    negative  . Breast lumpectomy Left 02/21/2012    positive  . Cataract extraction w/phaco Right 03/01/2015    Procedure: CATARACT EXTRACTION PHACO AND INTRAOCULAR LENS PLACEMENT (IOC);  Surgeon: Birder Robson, MD;  Location: [facility] ORS;  Service: Ophthalmology;   Laterality: Right;  US:1:03.1 AP:22.8 CDE:14.37  Fluid lot # [id number]  . Cataract extraction w/phaco Left 03/22/2015    Procedure: CATARACT EXTRACTION PHACO AND INTRAOCULAR LENS PLACEMENT (IOC);  Surgeon: Birder Robson, MD;  Location: [facility] ORS;  Service: Ophthalmology;  Laterality: Left;  [place]: 01:01.0 AP%: 22.5% CDE: 13.75 Lot # [id number]    There were no vitals filed for this visit.  Visit Diagnosis:  Bilateral low back pain, with sciatica presence unspecified  Weakness  Risk for falls      Subjective Assessment - 07/14/15 1126    Subjective R knee (medial) is bothering her more today.   Pertinent History Pt states that her doctors suggested that physical therapy might help her balance and energy. Pt states wanting to do everything she can to keep her from falling. Had Cataract surgery August, September, October 2016.  Pt states that her doctor did not give her any restrictions with physical activity since her surgery.  Patient also states having back pain, had an MRI which revealed scoliosis and degenerative spine.   Pt states that  she is not able to work in the yard like she used to or get down onto the floor or stand  up like she did.  Pt also states that 2 years ago, her BP significantly decreased which affected her balance. Currently has a pacemaker.  Pt states that she has not fallen and is very careful.  Denies dizziness.  Pt states that she has low energy at times, takes vitamins. Pt states that she eats lots of protein.  Pt also states that October 2015, her blood pressure went to a dangerous level after her quilting class and was sent to the emergency room.  For the past 2-3 years, pt feels like her energy is decreasing. Pt states using a SPC and a rw . Pt denies bowel or bladder problems or tingling or numbness LE.   Patient Stated Goals Improve balance. Pt also expresses desire to increase energy level. To walk without using SPC.    Multiple Pain Sites No        Objectives:  There-ex Pt was recommended to hold off supine lower trunk rotation HEP to see if knee pain decreases. Pt verbalized understanding.  Directed patient with seated R hip ER 10x3 with 5 second holds Seated hip adductor ball squeeze 10x3 with 5 second holds Standing R hip abduction 10x3 Standing R hip extension 10x3 Gait approximately 30 ft with SPC on L to decrease pressure on R knee (decreased R knee discomfort during gait)    Improved exercise technique, movement at target joints, use of target muscles after mod verbal, visual, tactile cues.     Manual therapy:  Gentle ER of femur, IR of tibia R knee. Decreased R knee pain during gait afterwards   Decreased R knee pain with gait after manual therapy, seated R hip ER, and seated hip adduction isometric exercises. R medial knee discomfort during push-off R LE with increased walking. Decreased R knee discomfort when utilizing SPC on L side to decrease R knee pressure.                            PT Education - 07/14/15 1132    Education provided Yes   Education Details ther-ex, HEP   Person(s) Educated Patient   Methods Explanation;Demonstration;Tactile cues;Verbal cues   Comprehension Verbalized understanding;Returned demonstration             PT Long Term Goals - 07/04/15 1130    PT LONG TERM GOAL #1   Title Patient will improve bilateral glute med and max strength by 1/2 MMT grade to promote balance and ability to perform standing tasks.    Time 4   Period Weeks   Status On-going   PT LONG TERM GOAL #2   Title Patient will improve her Merrilee Jansky Balance Test score to at least 46/56 as a demonstration of improved balance and decreased fall risk.   Baseline Initial evaluation score 41/56. Current score 45/46.   Time 4   Period Weeks   Status On-going   PT LONG TERM GOAL #3   Title Patient will report decreased difficulty getting out of her bed in the morning from her back stiffness to  promote mobility.    Time 4   Period Weeks   Status On-going   PT LONG TERM GOAL #4   Title Patient will be able to pick up 5 lbs from the floor at least 8x total to promote ability to perform tasks in her garden.    Time 4   Period Weeks   Status On-going  Plan - 07/14/15 1132    Clinical Impression Statement Decreased R knee pain with gait after manual therapy, seated R hip ER, and seated hip adduction isometric exercises. R medial knee discomfort during push-off R LE with increased walking. Decreased R knee discomfort when utilizing SPC on L side to decrease R knee pressure.    Pt will benefit from skilled therapeutic intervention in order to improve on the following deficits Postural dysfunction;Decreased strength;Decreased balance;Pain   Rehab Potential Good   Clinical Impairments Affecting Rehab Potential age, multiple health problems   PT Frequency 2x / week   PT Duration 4 weeks   PT Treatment/Interventions Manual techniques;Therapeutic exercise;Therapeutic activities;Gait training;Patient/family education   PT Next Visit Plan L side bending, thoracic extension glute med and max strengthening, balance activities   Consulted and Agree with Plan of Care Patient        Problem List Patient Active Problem List   Diagnosis Date Noted  . Breast cancer [facility]) 02/02/2015    Joneen Boers PT, DPT   07/14/2015, 12:39 PM  [facility] [address] [place], [address] Phone: [phone]   Fax:  [phone]  Name: Sue Knapp MRN: [id number] Date of Birth: Aug 21, 1922

## 2015-07-14 NOTE — Patient Instructions (Signed)
EXTERNAL ROTATION: Sitting (Active)    Sit, feet flat. Lift right knee and move foot inward toward opposite knee.  Complete __3_ sets of ___10 repetitions. Perform ___1 sessions per day.  Copyright  VHI. All rights reserved.    Adduction: Hip - Knees Together (Sitting)   Sit with thick towel roll or thick pillow between knees. Push knees together. Hold for _5__ seconds. Repeat _10__ times. Do __3_ times a day.  Copyright  VHI. All rights reserved.    ABDUCTION: Standing (Active)    Holding onto something sturdy for safety. Stand, feet flat. Lift right leg out to side.  Complete _3__ sets of __10_ repetitions. Perform __1_ sessions per day.  http://gtsc.exer.us/111   Copyright  VHI. All rights reserved.

## 2015-07-20 ENCOUNTER — Ambulatory Visit: Payer: Medicare Other

## 2015-07-20 DIAGNOSIS — M545 Low back pain: Secondary | ICD-10-CM

## 2015-07-20 DIAGNOSIS — Z9181 History of falling: Secondary | ICD-10-CM

## 2015-07-20 DIAGNOSIS — R531 Weakness: Secondary | ICD-10-CM

## 2015-07-20 NOTE — Patient Instructions (Addendum)
   Strengthening: Hip Abduction (Side-Lying)       Tighten muscles on front of left thigh, then lift leg  from surface, keeping knee locked.  Repeat _10___ times per set. Do _3___ sets per session. Do _1___ sessions per day.  http://orth.exer.us/622    Also gave supine hip extension isometrics (leg straight) 10x3 with 5 second holds daily as part of her HEP. Pt demonstrated and verbalized understanding.   If pt goes up and down stairs, pt was recommended to use bilateral rails and step up with her good leg and step down with her bad leg to not aggravate her R knee pain. Pt demonstrated and verbalized understanding.

## 2015-07-20 NOTE — Therapy (Signed)
[place] [address], [place], [address] Phone: [phone]   Fax:  [phone]  Physical Therapy Treatment And Discharge Summary  Patient Details  Name: Sue Knapp MRN: [id number] Date of Birth: 1922/10/04 Referring Provider: Rusty Aus, MD  Encounter Date: 07/20/2015      PT End of Session - 07/20/15 1121    Visit Number 9   Number of Visits 17  8 additional visits from 59/93/57 recert   Date for PT Re-Evaluation 08/04/15   Authorization Type 9   Authorization Time Period of 10    PT Start Time 1121   PT Stop Time 1225   PT Time Calculation (min) 64 min   Equipment Utilized During Treatment --  SPC on R   Activity Tolerance Patient tolerated treatment well   Behavior During Therapy [facility] for tasks assessed/performed      Past Medical History  Diagnosis Date  . Hypertension   . Cataract   . Hyperlipidemia   . Glaucoma   . Osteoporosis   . Hypothyroidism   . TIA (transient ischemic attack)   . Left bundle branch block   . Hiatal hernia   . Thyroid nodule   . Pacemaker   . Shortness of breath dyspnea   . Heart murmur   . Neuropathy ([place])   . GERD (gastroesophageal reflux disease)   . Depression   . HOH (hard of hearing)     AIDS  . Arthritis   . Scoliosis   . Fatigue   . Heart palpitations   . Breast cancer ([place]) 2013    left breast, radiation and lumpectomy  . Palpitations   . Left ankle injury     Old left ankle injury    Past Surgical History  Procedure Laterality Date  . Thyroidectomy, partial    . Abdominal hysterectomy    . Mastectomy partial / lumpectomy    . Breast surgery      LUMPECTOMY  . Breast biopsy Left 2005    negative  . Breast lumpectomy Left 02/21/2012    positive  . Cataract extraction w/phaco Right 03/01/2015    Procedure: CATARACT EXTRACTION PHACO AND INTRAOCULAR LENS PLACEMENT (IOC);  Surgeon: Sue Robson, MD;  Location: [facility] ORS;   Service: Ophthalmology;  Laterality: Right;  US:1:03.1 AP:22.8 CDE:14.37  Fluid lot # [id number]  . Cataract extraction w/phaco Left 03/22/2015    Procedure: CATARACT EXTRACTION PHACO AND INTRAOCULAR LENS PLACEMENT (IOC);  Surgeon: Sue Robson, MD;  Location: [facility] ORS;  Service: Ophthalmology;  Laterality: Left;  [place]: 01:01.0 AP%: 22.5% CDE: 13.75 Lot # [id number]    There were no vitals filed for this visit.  Visit Diagnosis:  Bilateral low back pain, with sciatica presence unspecified  Weakness  Risk for falls      Subjective Assessment - 07/20/15 1123    Subjective Pt states that she wants to try the exercise programs at the [facility] and continue from there. Pt states R knee bothers her a little bit when she is walking but not as uncomfortable as it has been. Does not climb up steps because it stresses her R knee.  No knee pain currently. "I felt pretty good this morning."    Pertinent History Pt states that her doctors suggested that physical therapy might help her balance and energy. Pt states wanting to do everything she can to keep her from falling. Had Cataract surgery August, September, October 2016.  Pt states  that her doctor did not give her any restrictions with physical activity since her surgery.  Patient also states having back pain, had an MRI which revealed scoliosis and degenerative spine.   Pt states that  she is not able to work in the yard like she used to or get down onto the floor or stand up like she did.  Pt also states that 2 years ago, her BP significantly decreased which affected her balance. Currently has a pacemaker.  Pt states that she has not fallen and is very careful.  Denies dizziness.  Pt states that she has low energy at times, takes vitamins. Pt states that she eats lots of protein.  Pt also states that October 2015, her blood pressure went to a dangerous level after her quilting class and was sent to the emergency room.  For the past 2-3  years, pt feels like her energy is decreasing. Pt states using a SPC and a rw . Pt denies bowel or bladder problems or tingling or numbness LE.   Patient Stated Goals Improve balance. Pt also expresses desire to increase energy level. To walk without using SPC.    Currently in Pain? No/denies    Pt also adds that the supine lower trunk rotation exercise helps her back in the morning   Objectives  Directed patient with S/L hip abduction 10x each LE   Supine hip extension (leg straight) isometrics 10x3 with 5 second holds each LE  Manually resisted S/L hip abduction, prone glute max extension 1x each way for each LE Reviewed current status with hip strength with pt  Ascending and descending 4 regular steps 2x using bilateral rails and stepping up with her good leg and stepping down with her bad leg to not aggravate her R knee pain.  Recommended pt to use a stool to sit on when gardening so pt does not have to squat and aggravate her R knee. Pt verbalized understanding.   Directed patient with sit <> stand throughout session Stand pivot transfer chair <> low mat table Static standing shoulder width apart, then with eyes closed, then with eyes open feet together, tandem stance with feet shoulder width apart Picking up an object (computer mouse) from the floor,  Turning 360 degrees to the R and to the L 1x Looking behind to the R and to the L,  Standing forward reach,   Standing alternate toe taps 8 x each LE onto first regular step  Improved exercise technique, movement at target joints, use of target muscles after min to mod verbal, visual, tactile cues.   Reviewed progress/current status with [name] Balance with pt.    Overall, patient has demonstrated improved L glute med strength and  slight improvement in her Berg Balance test score, improving by 2 points suggesting slight increase in balance since initial evaluation. Pt still demonstrates difficulty getting out of bed in the  morning but her exercises help decrease her back stiffness. Pt also demonstrates some difficulty with walking at times due to R knee pain. Pt also expressed desire to try the exercise programs at the [facility] and continue from there. Skilled physical therapy services discharged with pt continuing progress with her home exercise program and participation in exercise programs at her home.             [facility] PT Assessment - 07/20/15 1140    Strength   Right Hip Extension 4-/5   Right Hip ABduction 4/5   Left Hip Extension 4/5  Left Hip ABduction 4/5   Berg Balance Test   Sit to Stand Able to stand  independently using hands   Standing Unsupported Able to stand safely 2 minutes   Sitting with Back Unsupported but Feet Supported on Floor or Stool Able to sit safely and securely 2 minutes   Stand to Sit Sits safely with minimal use of hands   Transfers Able to transfer safely, definite need of hands   Standing Unsupported with Eyes Closed Able to stand 10 seconds safely   Standing Ubsupported with Feet Together Able to place feet together independently and stand 1 minute safely   From Standing, Reach Forward with Outstretched Arm Can reach forward >12 cm safely (5")   From Standing Position, Pick up Object from Floor Able to pick up shoe safely and easily   From Standing Position, Turn to Look Behind Over each Shoulder Looks behind from both sides and weight shifts well   Turn 360 Degrees Able to turn 360 degrees safely but slowly   Standing Unsupported, Alternately Place Feet on Step/Stool Able to complete >2 steps/needs minimal assist   Standing Unsupported, One Foot in Front Able to plae foot ahead of the other independently and hold 30 seconds   Standing on One Leg Unable to try or needs assist to prevent fall   Total Score 43   Berg comment: 43/56. Slight decrease in score by 2 points since last measured but still higher by 2 points compared to initial evaluation.                               PT Education - 07/20/15 1830    Education provided Yes   Education Details ther-ex, progress/current status, HEP   Person(s) Educated Patient   Methods Explanation;Demonstration;Verbal cues;Handout   Comprehension Verbalized understanding;Returned demonstration             PT Long Term Goals - 07/20/15 1131    PT LONG TERM GOAL #1   Title Patient will improve bilateral glute med and max strength by 1/2 MMT grade to promote balance and ability to perform standing tasks.    Time 4   Period Weeks   Status On-going   PT LONG TERM GOAL #2   Title Patient will improve her Merrilee Jansky Balance Test score to at least 46/56 as a demonstration of improved balance and decreased fall risk.   Baseline Initial evaluation score 41/56. Current score 43/56   Time 4   Period Weeks   Status On-going   PT LONG TERM GOAL #3   Title Patient will report decreased difficulty getting out of her bed in the morning from her back stiffness to promote mobility.    Time 4   Period Weeks   Status Not Met   PT LONG TERM GOAL #4   Title Patient will be able to pick up 5 lbs from the floor at least 8x total to promote ability to perform tasks in her garden.    Time 4   Period Weeks   Status Unable to assess  Not assessed to not aggravate her R knee pain               Plan - 07/20/15 1835    Clinical Impression Statement Overall, patient has demonstrated improved L glute med strength and  slight improvement in her Berg Balance test score, improving by 2 points suggesting slight increase in balance since initial evaluation. Pt  still demonstrates difficulty getting out of bed in the morning but her exercises help decrease her back stiffness. Some difficulty with walking at times due to R knee pain. Pt also expressed desire to try the exercise programs at the [facility] and continue from there. Skilled physical therapy services discharged with pt  continuing progress with her home exercise program and participation in exercise programs at her home.    Rehab Potential Good   Clinical Impairments Affecting Rehab Potential age, multiple health problems   PT Treatment/Interventions Manual techniques;Therapeutic exercise;Therapeutic activities;Gait training;Patient/family education   PT Next Visit Plan Continue with home program.   Consulted and Agree with Plan of Care Patient          G-Codes - 2015/08/19 1839    Functional Assessment Tool Used Berg Balance Test, patient interview, clinical presentation   Functional Limitation Mobility: Walking and moving around   Mobility: Walking and Moving Around Goal Status [phone]) At least 1 percent but less than 20 percent impaired, limited or restricted   Mobility: Walking and Moving Around Discharge Status [phone]) At least 20 percent but less than 40 percent impaired, limited or restricted      Problem List Patient Active Problem List   Diagnosis Date Noted  . Breast cancer ([place]) 02/02/2015   Thank you for your referral.  Joneen Boers PT, DPT   August 19, 2015, 7:16 PM  [place] [facility] [address] [place], [address] Phone: [phone]   Fax:  [phone]  Name: Sue Knapp MRN: [id number] Date of Birth: May 29, 1923

## 2015-08-01 ENCOUNTER — Inpatient Hospital Stay: Payer: Medicare Other | Attending: Oncology | Admitting: Oncology

## 2015-08-25 ENCOUNTER — Inpatient Hospital Stay: Payer: Medicare Other | Admitting: Oncology

## 2015-09-08 ENCOUNTER — Inpatient Hospital Stay: Payer: Medicare Other | Attending: Oncology | Admitting: Oncology

## 2015-09-08 VITALS — BP 145/81 | HR 81 | Temp 97.8°F | Resp 18 | Wt 138.9 lb

## 2015-09-08 DIAGNOSIS — M1990 Unspecified osteoarthritis, unspecified site: Secondary | ICD-10-CM | POA: Insufficient documentation

## 2015-09-08 DIAGNOSIS — C50912 Malignant neoplasm of unspecified site of left female breast: Secondary | ICD-10-CM

## 2015-09-08 DIAGNOSIS — Z79899 Other long term (current) drug therapy: Secondary | ICD-10-CM | POA: Diagnosis not present

## 2015-09-08 DIAGNOSIS — K219 Gastro-esophageal reflux disease without esophagitis: Secondary | ICD-10-CM | POA: Diagnosis not present

## 2015-09-08 DIAGNOSIS — I10 Essential (primary) hypertension: Secondary | ICD-10-CM | POA: Insufficient documentation

## 2015-09-08 DIAGNOSIS — Z9012 Acquired absence of left breast and nipple: Secondary | ICD-10-CM | POA: Insufficient documentation

## 2015-09-08 DIAGNOSIS — Z79811 Long term (current) use of aromatase inhibitors: Secondary | ICD-10-CM | POA: Diagnosis not present

## 2015-09-08 DIAGNOSIS — K449 Diaphragmatic hernia without obstruction or gangrene: Secondary | ICD-10-CM | POA: Diagnosis not present

## 2015-09-08 DIAGNOSIS — M419 Scoliosis, unspecified: Secondary | ICD-10-CM | POA: Diagnosis not present

## 2015-09-08 DIAGNOSIS — M810 Age-related osteoporosis without current pathological fracture: Secondary | ICD-10-CM | POA: Diagnosis not present

## 2015-09-08 DIAGNOSIS — E039 Hypothyroidism, unspecified: Secondary | ICD-10-CM | POA: Insufficient documentation

## 2015-09-08 DIAGNOSIS — Z170 Estrogen receptor positive status [ER+]: Secondary | ICD-10-CM | POA: Insufficient documentation

## 2015-09-08 DIAGNOSIS — E785 Hyperlipidemia, unspecified: Secondary | ICD-10-CM | POA: Diagnosis not present

## 2015-09-08 DIAGNOSIS — Z923 Personal history of irradiation: Secondary | ICD-10-CM

## 2015-09-08 DIAGNOSIS — F329 Major depressive disorder, single episode, unspecified: Secondary | ICD-10-CM | POA: Insufficient documentation

## 2015-09-15 ENCOUNTER — Ambulatory Visit
Admission: RE | Admit: 2015-09-15 | Discharge: 2015-09-15 | Disposition: A | Discharge status: Home / Self Care | Payer: Medicare Other | Source: Ambulatory Visit | Attending: Oncology | Admitting: Oncology

## 2015-09-15 DIAGNOSIS — M810 Age-related osteoporosis without current pathological fracture: Secondary | ICD-10-CM | POA: Diagnosis not present

## 2015-09-15 DIAGNOSIS — Z853 Personal history of malignant neoplasm of breast: Secondary | ICD-10-CM | POA: Diagnosis present

## 2015-09-15 DIAGNOSIS — Z13820 Encounter for screening for osteoporosis: Secondary | ICD-10-CM | POA: Diagnosis present

## 2015-09-15 DIAGNOSIS — M47816 Spondylosis without myelopathy or radiculopathy, lumbar region: Secondary | ICD-10-CM | POA: Insufficient documentation

## 2015-09-15 DIAGNOSIS — C50912 Malignant neoplasm of unspecified site of left female breast: Secondary | ICD-10-CM

## 2015-09-17 NOTE — Progress Notes (Signed)
Sue Knapp  [phone] [phone]  ID: Sue Knapp OB: 1923-06-18  MR#: [id number]  QQP#:[id number]  Patient Care Team: Rusty Aus, MD as PCP - General (Internal Medicine)  CHIEF COMPLAINT:  Chief Complaint  Patient presents with  . Breast Cancer    INTERVAL HISTORY: Patient returns to clinic today for further evaluation and routine 6 monthup.  She continues to have multiple medical complaints that are chronic and unchanged.  She is tolerating letrozole and Fosamax well without significant side effects.  She has increased fatigue.  She denies any pain.  She has no neurologic complaints.  She has a good appetite and denies weight loss.  She has no chest pain or shortness of breath.  She denies any nausea, vomiting, constipation, or diarrhea.  She has no urinary complaints.  Patient offers no further specific complaints today.   REVIEW OF SYSTEMS:   Review of Systems  Constitutional: Positive for malaise/fatigue. Negative for fever and weight loss.  Respiratory: Positive for shortness of breath.   Cardiovascular: Negative.   Gastrointestinal: Negative.   Musculoskeletal: Positive for joint pain.  Neurological: Negative.     As per HPI. Otherwise, a complete review of systems is negatve.  PAST MEDICAL HISTORY: Past Medical History  Diagnosis Date  . Hypertension   . Cataract   . Hyperlipidemia   . Glaucoma   . Osteoporosis   . Hypothyroidism   . TIA (transient ischemic attack)   . Left bundle branch block   . Hiatal hernia   . Thyroid nodule   . Pacemaker   . Shortness of breath dyspnea   . Heart murmur   . Neuropathy ([place])   . GERD (gastroesophageal reflux disease)   . Depression   . HOH (hard of hearing)     AIDS  . Arthritis   . Scoliosis   . Fatigue   . Heart palpitations   . Palpitations   . Left ankle injury     Old left ankle injury  . Breast cancer ([place]) 2013    left breast, radiation and lumpectomy     PAST SURGICAL HISTORY: Past Surgical History  Procedure Laterality Date  . Thyroidectomy, partial    . Abdominal hysterectomy    . Mastectomy partial / lumpectomy    . Breast surgery      LUMPECTOMY  . Breast biopsy Left 2005    negative  . Breast lumpectomy Left 02/21/2012    positive  . Cataract extraction w/phaco Right 03/01/2015    Procedure: CATARACT EXTRACTION PHACO AND INTRAOCULAR LENS PLACEMENT (IOC);  Surgeon: Birder Robson, MD;  Location: [facility] ORS;  Service: Ophthalmology;  Laterality: Right;  US:1:03.1 AP:22.8 CDE:14.37  Fluid lot # [id number]  . Cataract extraction w/phaco Left 03/22/2015    Procedure: CATARACT EXTRACTION PHACO AND INTRAOCULAR LENS PLACEMENT (IOC);  Surgeon: Birder Robson, MD;  Location: [facility] ORS;  Service: Ophthalmology;  Laterality: Left;  [place]: 01:01.0 AP%: 22.5% CDE: 13.75 Lot # [id number]    FAMILY HISTORY No family history on file.     ADVANCED DIRECTIVES:    HEALTH MAINTENANCE: Social History  Substance Use Topics  . Smoking status: Never Smoker   . Smokeless tobacco: Never Used  . Alcohol Use: No     Colonoscopy:  PAP:  Bone density:  Lipid panel:  Allergies  Allergen Reactions  . Alendronate Sodium Nausea Only  . Codeine Other (See Comments)  . Etodolac Other (See Comments)  . Zolpidem Other (See Comments)  Current Outpatient Prescriptions  Medication Sig Dispense Refill  . alendronate (FOSAMAX) 70 MG tablet Take 1 tablet (70 mg total) by mouth once a week. 12 tablet 3  . amLODipine (NORVASC) 5 MG tablet     . aspirin EC 81 MG tablet Take by mouth.    . Calcium Carbonate-Vitamin D 600-400 MG-UNIT per tablet Take by mouth.    . letrozole (FEMARA) 2.5 MG tablet TAKE 1 TABLET DAILY 90 tablet 2  . LORazepam (ATIVAN) 0.5 MG tablet Take 0.5 mg by mouth daily.     Marland Kitchen LUMIGAN 0.01 % SOLN     . Multiple Vitamin (MULTI-VITAMINS) TABS Take by mouth.    . pravastatin (PRAVACHOL) 20 MG tablet 20 mg daily.     . ranitidine  (ZANTAC) 150 MG tablet Take 150 mg by mouth 2 (two) times daily.     . valsartan (DIOVAN) 160 MG tablet 160 mg 2 (two) times daily.      No current facility-administered medications for this visit.    OBJECTIVE: Filed Vitals:   09/08/15 1432  BP: 145/81  Pulse: 81  Temp: 97.8 F (36.6 C)  Resp: 18     Body mass index is 27.13 kg/(m^2).    ECOG FS:1 - Symptomatic but completely ambulatory  General: Well-developed, well-nourished, no acute distress. Eyes: anicteric sclera. Breasts: Bilateral breasts and axilla without lumps or masses. Lungs: Clear to auscultation bilaterally. Heart: Regular rate and rhythm. No rubs, murmurs, or gallops. Abdomen: Soft, nontender, nondistended. No organomegaly noted, normoactive bowel sounds. Musculoskeletal: No edema, cyanosis, or clubbing. Neuro: Alert, answering all questions appropriately. Cranial nerves grossly intact. Skin: No rashes or petechiae noted. Psych: Normal affect.   LAB RESULTS:  Lab Results  Component Value Date   NA 138 06/09/2013   K 3.9 06/09/2013   CL 105 06/09/2013   CO2 28 06/09/2013   GLUCOSE 135* 06/09/2013   BUN 23* 06/09/2013   CREATININE 0.99 06/09/2013   CALCIUM 9.0 06/09/2013   PROT 5.5* 05/28/2013   ALBUMIN 2.6* 05/28/2013   AST 30 05/28/2013   ALT 13 05/28/2013   ALKPHOS 67 05/28/2013   BILITOT 0.3 05/28/2013   GFRNONAA 51* 06/09/2013   GFRAA 59* 06/09/2013    Lab Results  Component Value Date   WBC 7.4 06/09/2013   NEUTROABS 5.8 06/09/2013   HGB 11.4* 06/09/2013   HCT 33.7* 06/09/2013   MCV 87 06/09/2013   PLT 406 06/09/2013     STUDIES: Dg Bone Density  09/15/2015  EXAM: DUAL X-RAY ABSORPTIOMETRY (DXA) FOR BONE MINERAL DENSITY IMPRESSION: Dear Dr. Grayland Ormond, Your patient Sue Knapp completed a BMD test on 09/15/2015 using the [place] (analysis version: 14.10) manufactured by [employer]. The following summarizes the results of our evaluation. PATIENT BIOGRAPHICAL: Name:  Sue Knapp, Sue Knapp Patient ID: [id number] Birth Date: 1923/02/02 Height: 59.0 in. Gender: Female Exam Date: 09/15/2015 Weight: 136.1 lbs. Indications: Advanced Age, Caucasian, Family Hx of Osteoporosis, Height Loss, High Risk Meds, History of Breast Cancer, Hysterectomy, Oophorectomy Unilateral, Postmenopausal, Scoliosis Fractures: Left ankle Treatments: ASPRIN 81 MG, CALCIUM VIT D, fosamax, letrozole, Multi-Vitamin with calcium, pravastatin, Vit D ASSESSMENT: The BMD measured at Femur Neck Right is 0.696 g/cm2 with a T-score of -2.5. This patient is considered osteoporotic according to [facility]) criteria. Lumbar spine was not utilized due to advanced degenerative changes/scoliosis. Site Region Measured Measured WHO Young Adult BMD Date       Age      Classification T-score DualFemur Neck Right  09/15/2015 90.6 Osteoporosis -2.5 0.696 g/cm2 DualFemur Neck Right 08/23/2014 89.6 Osteopenia -2.2 0.729 g/cm2 DualFemur Neck Right 05/29/2012 87.3 Osteopenia -2.1 0.752 g/cm2 Left Forearm Radius 33% 09/15/2015 90.6 Osteopenia -2.0 0.705 g/cm2 Left Forearm Radius 33% 08/23/2014 89.6 Osteopenia -2.3 0.673 g/cm2 Left Forearm Radius 33% 07/22/2013 88.5 Osteopenia -2.2 0.687 g/cm2 Left Forearm Radius 33% 05/29/2012 87.3 Osteopenia -2.4 0.668 g/cm2 World Health Organization [facility]) criteria for post-menopausal, Caucasian Women: Normal:       T-score at or above -1 SD Osteopenia:   T-score between -1 and -2.5 SD Osteoporosis: T-score at or below -2.5 SD RECOMMENDATIONS: [place] recommends that FDA-approved medical therapies be considered in postmenopausal women and men age 87 or older with a: 1. Hip or vertebral (clinical or morphometric) fracture. 2. T-score of < -2.5 at the spine or hip. 3. Ten-year fracture probability by FRAX of 3% or greater for hip fracture or 20% or greater for major osteoporotic fracture. All treatment decisions require clinical judgment and consideration of  individual patient factors, including patient preferences, co-morbidities, previous drug use, risk factors not captured in the FRAX model (e.g. falls, vitamin D deficiency, increased bone turnover, interval significant decline in bone density) and possible under - or over-estimation of fracture risk by FRAX. All patients should ensure an adequate intake of dietary calcium (1200 mg/d) and vitamin D (800 IU daily) unless contraindicated. FOLLOW-UP: People with diagnosed cases of osteoporosis or at high risk for fracture should have regular bone mineral density tests. For patients eligible for Medicare, routine testing is allowed once every 2 years. The testing frequency can be increased to one year for patients who have rapidly progressing disease, those who are receiving or discontinuing medical therapy to restore bone mass, or have additional risk factors. I have reviewed this report, and agree with the above findings. [facility] Radiology Electronically Signed   By: Lajean Manes M.D.   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  Breast cancer: Given patient's advanced age and low stage of disease, she did not require adjuvant chemotherapy.  Continue letrozole daily for 5 years completing in November 2018.  Return to clinic in 6 months for routine evaluation.  Her most recent mammogram on February 23, 2015 was reported as BI-RADS 2, repeat in August 2017.   2.  Osteoporosis: Bone mineral density from September 15, 2015 reported a T score of -2.5, which is slightly improved from one year ago.  Continue Fosamax, calcium and vitamin D supplementation.   Patient expressed understanding and was in agreement with this plan. She also understands that She can call clinic at any time with any questions, concerns, or complaints.   Breast cancer   Staging form: Breast, AJCC 7th Edition     Clinical stage from 02/02/2015: Stage IA (T1b,  N0, M0) - Signed by Lloyd Huger, MD on 02/02/2015   Lloyd Huger, MD   09/17/2015 1:11 PM

## 2015-11-09 DIAGNOSIS — E782 Mixed hyperlipidemia: Secondary | ICD-10-CM | POA: Insufficient documentation

## 2015-11-09 DIAGNOSIS — G609 Hereditary and idiopathic neuropathy, unspecified: Secondary | ICD-10-CM | POA: Insufficient documentation

## 2015-11-09 DIAGNOSIS — M1990 Unspecified osteoarthritis, unspecified site: Secondary | ICD-10-CM | POA: Insufficient documentation

## 2015-12-20 ENCOUNTER — Other Ambulatory Visit: Payer: Self-pay | Admitting: *Deleted

## 2015-12-20 ENCOUNTER — Other Ambulatory Visit: Payer: Self-pay | Admitting: Oncology

## 2015-12-20 DIAGNOSIS — C50912 Malignant neoplasm of unspecified site of left female breast: Secondary | ICD-10-CM

## 2016-01-29 ENCOUNTER — Other Ambulatory Visit: Payer: Self-pay | Admitting: Oncology

## 2016-02-27 ENCOUNTER — Ambulatory Visit
Admission: RE | Admit: 2016-02-27 | Discharge: 2016-02-27 | Disposition: A | Discharge status: Home / Self Care | Payer: Medicare Other | Source: Ambulatory Visit | Attending: Oncology | Admitting: Oncology

## 2016-02-27 DIAGNOSIS — C50912 Malignant neoplasm of unspecified site of left female breast: Secondary | ICD-10-CM

## 2016-03-19 ENCOUNTER — Other Ambulatory Visit: Payer: Self-pay | Admitting: Oncology

## 2016-03-19 NOTE — Progress Notes (Signed)
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 She is tolerating letrozole and Fosamax well without significant side effects.  She continues to have weakness and fatigue. She denies any pain.  She has no neurologic complaints.  She has a good appetite and denies weight loss.  She has no chest pain or shortness of breath.  She denies any nausea, vomiting, constipation, or diarrhea.  She has no urinary complaints.  Patient offers no further specific complaints today.   REVIEW OF SYSTEMS:   Review of Systems  Constitutional: Positive for malaise/fatigue. Negative for fever and weight loss.  Respiratory: Negative for cough and shortness of breath.   Cardiovascular: Negative.  Negative for chest pain.  Gastrointestinal: Negative.  Negative for abdominal pain.  Musculoskeletal: Positive for joint pain.  Neurological: Positive for weakness.  Psychiatric/Behavioral: Negative.  The patient is not nervous/anxious.     As per HPI. Otherwise, a complete review of systems is negative.  PAST MEDICAL HISTORY: Past Medical History:  Diagnosis Date  . Arthritis   . Breast cancer ([place]) 2013   left breast, radiation and lumpectomy  . Cataract   . Depression   . Fatigue   . GERD (gastroesophageal reflux disease)   . Glaucoma   . Heart murmur   . Heart palpitations   . Hiatal hernia   . HOH (hard of hearing)    AIDS  . Hyperlipidemia   . Hypertension   . Hypothyroidism   . Left ankle injury    Old left ankle injury  . Left bundle branch block   . Neuropathy ([place])   . Osteoporosis   . Pacemaker   .  Palpitations   . Scoliosis   . Shortness of breath dyspnea   . Thyroid nodule   . TIA (transient ischemic attack)     PAST SURGICAL HISTORY: Past Surgical History:  Procedure Laterality Date  . ABDOMINAL HYSTERECTOMY    . BREAST BIOPSY Left 2005   negative  . BREAST EXCISIONAL BIOPSY Left 2013   stereotactic biopsy positive  . BREAST LUMPECTOMY Left 02/21/2012   positive  . BREAST SURGERY     LUMPECTOMY  . CATARACT EXTRACTION W/PHACO Right 03/01/2015   Procedure: CATARACT EXTRACTION PHACO AND INTRAOCULAR LENS PLACEMENT (IOC);  Surgeon: Birder Robson, MD;  Location: [facility] ORS;  Service: Ophthalmology;  Laterality: Right;  US:1:03.1 AP:22.8 CDE:14.37  Fluid lot # [id number]  . CATARACT EXTRACTION W/PHACO Left 03/22/2015   Procedure: CATARACT EXTRACTION PHACO AND INTRAOCULAR LENS PLACEMENT (IOC);  Surgeon: Birder Robson, MD;  Location: [facility] ORS;  Service: Ophthalmology;  Laterality: Left;  [place]: 01:01.0 AP%: 22.5% CDE: 13.75 Lot # [id number]  . MASTECTOMY PARTIAL / LUMPECTOMY    . THYROIDECTOMY, PARTIAL      FAMILY HISTORY No family history on file.     ADVANCED DIRECTIVES:    HEALTH MAINTENANCE: Social History  Substance Use Topics  . Smoking status: Never Smoker  . Smokeless tobacco: Never Used  . Alcohol use No     Colonoscopy:  PAP:  Bone density:  Lipid panel:  Allergies  Allergen Reactions  . Alendronate Sodium Nausea Only  . Codeine Other (See  Comments)  . Etodolac Other (See Comments)  . Zolpidem Other (See Comments)    Current Outpatient Prescriptions  Medication Sig Dispense Refill  . alendronate (FOSAMAX) 70 MG tablet TAKE 1 TABLET ONCE A WEEK 12 tablet 2  . aspirin EC 81 MG tablet Take by mouth.    . Cholecalciferol (VITAMIN D3) 1000 units CAPS Take 1 capsule by mouth daily.    Marland Kitchen ibuprofen (ADVIL,MOTRIN) 200 MG tablet Take 200 mg by mouth every 6 (six) hours as needed.    Marland Kitchen letrozole (FEMARA) 2.5 MG tablet TAKE 1 TABLET DAILY 90 tablet 0  .  LORazepam (ATIVAN) 0.5 MG tablet Take 0.5 mg by mouth daily.     Marland Kitchen LUMIGAN 0.01 % SOLN 1 drop at bedtime.     . Melatonin 1 MG TABS Take 1 tablet by mouth at bedtime as needed.    . Multiple Vitamin (MULTI-VITAMINS) TABS Take by mouth.    . pravastatin (PRAVACHOL) 20 MG tablet 20 mg daily.     . ranitidine (ZANTAC) 150 MG tablet Take 150 mg by mouth 2 (two) times daily.     . valsartan (DIOVAN) 160 MG tablet 160 mg 2 (two) times daily.     . vitamin B-12 (CYANOCOBALAMIN) 1000 MCG tablet Take 1,000 mcg by mouth daily.     No current facility-administered medications for this visit.     OBJECTIVE: Vitals:   03/21/16 1034  BP: (!) 188/74  Pulse: 85  Resp: 18  Temp: 98.9 F (37.2 C)     Body mass index is 26.69 kg/m.    ECOG FS:1 - Symptomatic but completely ambulatory  General: Well-developed, well-nourished, no acute distress. Eyes: anicteric sclera. Breasts: Bilateral breasts and axilla without lumps or masses. Lungs: Clear to auscultation bilaterally. Heart: Regular rate and rhythm. No rubs, murmurs, or gallops. Abdomen: Soft, nontender, nondistended. No organomegaly noted, normoactive bowel sounds. Musculoskeletal: No edema, cyanosis, or clubbing. Neuro: Alert, answering all questions appropriately. Cranial nerves grossly intact. Skin: No rashes or petechiae noted. Psych: Normal affect.   LAB RESULTS:  Lab Results  Component Value Date   NA 138 06/09/2013   K 3.9 06/09/2013   CL 105 06/09/2013   CO2 28 06/09/2013   GLUCOSE 135 (H) 06/09/2013   BUN 23 (H) 06/09/2013   CREATININE 0.99 06/09/2013   CALCIUM 9.0 06/09/2013   PROT 5.5 (L) 05/28/2013   ALBUMIN 2.6 (L) 05/28/2013   AST 30 05/28/2013   ALT 13 05/28/2013   ALKPHOS 67 05/28/2013   BILITOT 0.3 05/28/2013   GFRNONAA 51 (L) 06/09/2013   GFRAA 59 (L) 06/09/2013    Lab Results  Component Value Date   WBC 7.4 06/09/2013   NEUTROABS 5.8 06/09/2013   HGB 11.4 (L) 06/09/2013   HCT 33.7 (L) 06/09/2013   MCV  87 06/09/2013   PLT 406 06/09/2013     STUDIES: Mm Diag Breast Tomo Bilateral  Result Date: 02/27/2016 CLINICAL DATA:  82 year old female with 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 EXAM: 2D DIGITAL DIAGNOSTIC BILATERAL MAMMOGRAM WITH CAD AND ADJUNCT TOMO COMPARISON:  Previous exam(s). ACR Breast Density Category b: There are scattered areas of fibroglandular density. FINDINGS: Posttreatment changes are again seen in the left breast. No suspicious mass, calcifications, or other abnormality is identified within either breast. Mammographic images were processed with CAD. IMPRESSION: No mammographic evidence of malignancy. RECOMMENDATION: Bilateral diagnostic mammogram in 1 year. I have discussed the findings and recommendations with the patient. Results were also provided in writing  at the conclusion of the visit. If applicable, a reminder letter will be sent to the patient regarding the next appointment. BI-RADS CATEGORY  2: Benign. Electronically Signed   By: Pamelia Hoit M.D.   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  Continue letrozole daily for 5 years completing in November 2018.  Return to clinic in 6 months for routine evaluation.  Her most recent mammogram on March 08, 2016 was reported as BI-RADS 2, repeat in August 2017.   2.  Osteoporosis: Bone mineral density from September 15, 2015 reported a T score of -2.5, which is slightly improved from one year ago.  Continue Fosamax, calcium and vitamin D supplementation. Repeat in February 2018.  Patient expressed understanding and was in agreement with this plan. She also understands that She can call clinic at any time with any  questions, concerns, or complaints.   Breast cancer   Staging form: Breast, AJCC 7th Edition     Clinical stage from 02/02/2015: Stage IA (T1b, N0, M0) - Signed by Lloyd Huger, MD on 02/02/2015   Lloyd Huger, MD   03/24/2016 8:45 AM

## 2016-03-21 ENCOUNTER — Other Ambulatory Visit: Payer: Medicare Other

## 2016-03-21 ENCOUNTER — Inpatient Hospital Stay: Payer: Medicare Other | Attending: Oncology | Admitting: Oncology

## 2016-03-21 ENCOUNTER — Encounter (INDEPENDENT_AMBULATORY_CARE_PROVIDER_SITE_OTHER): Payer: Self-pay

## 2016-03-21 VITALS — BP 188/74 | HR 85 | Temp 98.9°F | Resp 18 | Wt 136.7 lb

## 2016-03-21 DIAGNOSIS — I10 Essential (primary) hypertension: Secondary | ICD-10-CM | POA: Diagnosis not present

## 2016-03-21 DIAGNOSIS — K219 Gastro-esophageal reflux disease without esophagitis: Secondary | ICD-10-CM | POA: Diagnosis not present

## 2016-03-21 DIAGNOSIS — M818 Other osteoporosis without current pathological fracture: Secondary | ICD-10-CM | POA: Diagnosis not present

## 2016-03-21 DIAGNOSIS — Z79811 Long term (current) use of aromatase inhibitors: Secondary | ICD-10-CM | POA: Insufficient documentation

## 2016-03-21 DIAGNOSIS — Z79899 Other long term (current) drug therapy: Secondary | ICD-10-CM | POA: Diagnosis not present

## 2016-03-21 DIAGNOSIS — E785 Hyperlipidemia, unspecified: Secondary | ICD-10-CM | POA: Diagnosis not present

## 2016-03-21 DIAGNOSIS — C50112 Malignant neoplasm of central portion of left female breast: Secondary | ICD-10-CM | POA: Insufficient documentation

## 2016-03-21 DIAGNOSIS — M1990 Unspecified osteoarthritis, unspecified site: Secondary | ICD-10-CM | POA: Insufficient documentation

## 2016-03-21 DIAGNOSIS — Z170 Estrogen receptor positive status [ER+]: Secondary | ICD-10-CM | POA: Diagnosis not present

## 2016-03-21 NOTE — Progress Notes (Signed)
States is not feeling well today. Is having numbness/tingling in left side of face that is intermittent. States numbness feels better after taking 81mg  aspirin. Has recently had loss of balance and had complete cardiac workup by Dr. Lavera Guise.

## 2016-05-16 ENCOUNTER — Ambulatory Visit: Payer: Medicare Other | Admitting: Physical Therapy

## 2016-05-23 ENCOUNTER — Encounter: Payer: Medicare Other | Admitting: Physical Therapy

## 2016-05-30 ENCOUNTER — Encounter: Payer: Medicare Other | Admitting: Physical Therapy

## 2016-06-05 ENCOUNTER — Ambulatory Visit: Payer: Medicare Other | Admitting: Physical Therapy

## 2016-06-06 ENCOUNTER — Encounter: Payer: Medicare Other | Admitting: Physical Therapy

## 2016-06-11 ENCOUNTER — Encounter: Payer: Medicare Other | Admitting: Physical Therapy

## 2016-06-17 ENCOUNTER — Other Ambulatory Visit: Payer: Self-pay | Admitting: Oncology

## 2016-06-20 ENCOUNTER — Encounter: Payer: Medicare Other | Admitting: Physical Therapy

## 2016-06-27 ENCOUNTER — Encounter: Payer: Medicare Other | Admitting: Physical Therapy

## 2016-07-03 ENCOUNTER — Encounter: Payer: Medicare Other | Admitting: Physical Therapy

## 2016-07-06 ENCOUNTER — Encounter: Payer: Medicare Other | Admitting: Physical Therapy

## 2016-07-10 ENCOUNTER — Encounter: Payer: Medicare Other | Admitting: Physical Therapy

## 2016-07-18 ENCOUNTER — Encounter: Payer: Medicare Other | Admitting: Physical Therapy

## 2016-07-24 ENCOUNTER — Encounter: Payer: Medicare Other | Admitting: Physical Therapy

## 2016-09-15 ENCOUNTER — Other Ambulatory Visit: Payer: Self-pay | Admitting: Oncology

## 2016-09-17 ENCOUNTER — Ambulatory Visit
Admission: RE | Admit: 2016-09-17 | Discharge: 2016-09-17 | Disposition: A | Discharge status: Home / Self Care | Payer: Medicare Other | Source: Ambulatory Visit | Attending: Oncology | Admitting: Oncology

## 2016-09-17 DIAGNOSIS — M810 Age-related osteoporosis without current pathological fracture: Secondary | ICD-10-CM | POA: Diagnosis not present

## 2016-09-17 DIAGNOSIS — C50112 Malignant neoplasm of central portion of left female breast: Secondary | ICD-10-CM | POA: Diagnosis not present

## 2016-09-17 DIAGNOSIS — Z780 Asymptomatic menopausal state: Secondary | ICD-10-CM | POA: Insufficient documentation

## 2016-09-17 DIAGNOSIS — Z79899 Other long term (current) drug therapy: Secondary | ICD-10-CM | POA: Diagnosis present

## 2016-09-24 ENCOUNTER — Inpatient Hospital Stay: Payer: Medicare Other | Admitting: Oncology

## 2016-10-08 ENCOUNTER — Other Ambulatory Visit: Payer: Self-pay | Admitting: Oncology

## 2016-10-14 NOTE — Progress Notes (Signed)
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 She continues to tolerate letrozole and Fosamax well without significant side effects.  She continues to have weakness and fatigue. She has some vague abdominal pain that is being worked up and managed by her primary care physician. She denies any other pain.  She has no neurologic complaints.  She has a good appetite and denies weight loss.  She has no chest pain or shortness of breath.  She denies any nausea, vomiting, constipation, or diarrhea.  She has no urinary complaints.  Patient offers no further specific complaints today.   REVIEW OF SYSTEMS:   Review of Systems  Constitutional: Positive for malaise/fatigue. Negative for fever and weight loss.  Respiratory: Negative for cough and shortness of breath.   Cardiovascular: Negative.  Negative for chest pain.  Musculoskeletal: Positive for joint pain.  Neurological: Positive for weakness. Negative for sensory change.  Psychiatric/Behavioral: Negative.  The patient is not nervous/anxious.     As per HPI. Otherwise, a complete review of systems is negative.  PAST MEDICAL HISTORY: Past Medical History:  Diagnosis Date  . Arthritis   . Breast cancer ([place]) 2013   left breast, radiation and lumpectomy  . Cataract   . Depression   . Fatigue   . GERD (gastroesophageal reflux disease)   . Glaucoma   . Heart murmur   . Heart palpitations   . Hiatal hernia   . HOH (hard of hearing)    AIDS  . Hyperlipidemia   . Hypertension   . Hypothyroidism   . Left ankle injury    Old left ankle injury  .  Left bundle branch block   . Neuropathy ([place])   . Osteoporosis   . Pacemaker   . Palpitations   . Scoliosis   . Shortness of breath dyspnea   . Thyroid nodule   . TIA (transient ischemic attack)     PAST SURGICAL HISTORY: Past Surgical History:  Procedure Laterality Date  . ABDOMINAL HYSTERECTOMY    . BREAST BIOPSY Left 2005   negative  . BREAST EXCISIONAL BIOPSY Left 2013   stereotactic biopsy positive  . BREAST LUMPECTOMY Left 02/21/2012   positive  . BREAST SURGERY     LUMPECTOMY  . CATARACT EXTRACTION W/PHACO Right 03/01/2015   Procedure: CATARACT EXTRACTION PHACO AND INTRAOCULAR LENS PLACEMENT (IOC);  Surgeon: Birder Robson, MD;  Location: [facility] ORS;  Service: Ophthalmology;  Laterality: Right;  US:1:03.1 AP:22.8 CDE:14.37  Fluid lot # [id number]  . CATARACT EXTRACTION W/PHACO Left 03/22/2015   Procedure: CATARACT EXTRACTION PHACO AND INTRAOCULAR LENS PLACEMENT (IOC);  Surgeon: Birder Robson, MD;  Location: [facility] ORS;  Service: Ophthalmology;  Laterality: Left;  [place]: 01:01.0 AP%: 22.5% CDE: 13.75 Lot # [id number]  . MASTECTOMY PARTIAL / LUMPECTOMY    . THYROIDECTOMY, PARTIAL      FAMILY HISTORY No family history on file.     ADVANCED DIRECTIVES:    HEALTH MAINTENANCE: Social History  Substance Use Topics  . Smoking status: Never Smoker  . Smokeless tobacco: Never Used  . Alcohol use No     Colonoscopy:  PAP:  Bone density:  Lipid  panel:  Allergies  Allergen Reactions  . Alendronate Sodium Nausea Only  . Codeine Other (See Comments)  . Etodolac Other (See Comments)  . Zolpidem Other (See Comments)    Current Outpatient Prescriptions  Medication Sig Dispense Refill  . alendronate (FOSAMAX) 70 MG tablet TAKE 1 TABLET ONCE A WEEK 12 tablet 2  . aspirin EC 81 MG tablet Take by mouth.    . latanoprost (XALATAN) 0.005 % ophthalmic solution Place 1 drop into both eyes at bedtime.    Marland Kitchen letrozole (FEMARA) 2.5 MG tablet TAKE 1 TABLET DAILY 90 tablet 0  .  pravastatin (PRAVACHOL) 20 MG tablet 20 mg daily.     . ranitidine (ZANTAC) 150 MG tablet Take 150 mg by mouth 2 (two) times daily.     . valsartan (DIOVAN) 160 MG tablet 160 mg 2 (two) times daily.      No current facility-administered medications for this visit.     OBJECTIVE: Vitals:   10/15/16 1417  BP: (!) 171/78  Pulse: 89  Resp: 18  Temp: 98.9 F (37.2 C)     Body mass index is 26.76 kg/m.    ECOG FS:1 - Symptomatic but completely ambulatory  General: Well-developed, well-nourished, no acute distress. Eyes: anicteric sclera. Breasts: Bilateral breasts and axilla without lumps or masses. Lungs: Clear to auscultation bilaterally. Heart: Regular rate and rhythm. No rubs, murmurs, or gallops. Abdomen: Soft, nontender, nondistended. No organomegaly noted, normoactive bowel sounds. Musculoskeletal: No edema, cyanosis, or clubbing. Neuro: Alert, answering all questions appropriately. Cranial nerves grossly intact. Skin: No rashes or petechiae noted. Psych: Normal affect.   LAB RESULTS:  Lab Results  Component Value Date   NA 138 06/09/2013   K 3.9 06/09/2013   CL 105 06/09/2013   CO2 28 06/09/2013   GLUCOSE 135 (H) 06/09/2013   BUN 23 (H) 06/09/2013   CREATININE 0.99 06/09/2013   CALCIUM 9.0 06/09/2013   PROT 5.5 (L) 05/28/2013   ALBUMIN 2.6 (L) 05/28/2013   AST 30 05/28/2013   ALT 13 05/28/2013   ALKPHOS 67 05/28/2013   BILITOT 0.3 05/28/2013   GFRNONAA 51 (L) 06/09/2013   GFRAA 59 (L) 06/09/2013    Lab Results  Component Value Date   WBC 7.4 06/09/2013   NEUTROABS 5.8 06/09/2013   HGB 11.4 (L) 06/09/2013   HCT 33.7 (L) 06/09/2013   MCV 87 06/09/2013   PLT 406 06/09/2013     STUDIES: No results found.  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  Continue letrozole daily for 5 years completing in November 2018.  Return to clinic in 6 months for routine evaluationAt which point can consider switching to yearly visits.  Her most recent mammogram on March 08, 2016 was reported as BI-RADS 2, repeat in August 2018.   2.  Osteoporosis: Bone mineral density from September 17, 2016 reported a T score of -2.7, which is essentially unchanged from one year ago.  Continue Fosamax, calcium and vitamin D supplementation. Repeat in February 2019. 3. Abdominal pain: Continue evaluation per primary care.  Patient expressed understanding and was in agreement with this plan. She also understands that She can call clinic at any time with any questions, concerns, or complaints.   Breast cancer  Staging form: Breast, AJCC 7th Edition     Clinical stage from 02/02/2015: Stage IA (T1b, N0, M0) - Signed by Lloyd Huger, MD on 02/02/2015   Lloyd Huger, MD   10/21/2016 4:22 PM

## 2016-10-15 ENCOUNTER — Inpatient Hospital Stay: Payer: Medicare Other | Attending: Oncology | Admitting: Oncology

## 2016-10-15 VITALS — BP 171/78 | HR 89 | Temp 98.9°F | Resp 18 | Wt 137.0 lb

## 2016-10-15 DIAGNOSIS — R109 Unspecified abdominal pain: Secondary | ICD-10-CM

## 2016-10-15 DIAGNOSIS — F329 Major depressive disorder, single episode, unspecified: Secondary | ICD-10-CM | POA: Diagnosis not present

## 2016-10-15 DIAGNOSIS — Z170 Estrogen receptor positive status [ER+]: Secondary | ICD-10-CM | POA: Diagnosis not present

## 2016-10-15 DIAGNOSIS — M810 Age-related osteoporosis without current pathological fracture: Secondary | ICD-10-CM | POA: Insufficient documentation

## 2016-10-15 DIAGNOSIS — I447 Left bundle-branch block, unspecified: Secondary | ICD-10-CM | POA: Diagnosis not present

## 2016-10-15 DIAGNOSIS — Z950 Presence of cardiac pacemaker: Secondary | ICD-10-CM | POA: Insufficient documentation

## 2016-10-15 DIAGNOSIS — E039 Hypothyroidism, unspecified: Secondary | ICD-10-CM | POA: Diagnosis not present

## 2016-10-15 DIAGNOSIS — Z7982 Long term (current) use of aspirin: Secondary | ICD-10-CM | POA: Diagnosis not present

## 2016-10-15 DIAGNOSIS — Z8673 Personal history of transient ischemic attack (TIA), and cerebral infarction without residual deficits: Secondary | ICD-10-CM | POA: Diagnosis not present

## 2016-10-15 DIAGNOSIS — K219 Gastro-esophageal reflux disease without esophagitis: Secondary | ICD-10-CM | POA: Diagnosis not present

## 2016-10-15 DIAGNOSIS — Z79811 Long term (current) use of aromatase inhibitors: Secondary | ICD-10-CM

## 2016-10-15 DIAGNOSIS — Z79899 Other long term (current) drug therapy: Secondary | ICD-10-CM | POA: Diagnosis not present

## 2016-10-15 DIAGNOSIS — E785 Hyperlipidemia, unspecified: Secondary | ICD-10-CM | POA: Insufficient documentation

## 2016-10-15 DIAGNOSIS — M419 Scoliosis, unspecified: Secondary | ICD-10-CM | POA: Diagnosis not present

## 2016-10-15 DIAGNOSIS — I10 Essential (primary) hypertension: Secondary | ICD-10-CM | POA: Diagnosis not present

## 2016-10-15 DIAGNOSIS — Z9012 Acquired absence of left breast and nipple: Secondary | ICD-10-CM | POA: Diagnosis not present

## 2016-10-15 DIAGNOSIS — K449 Diaphragmatic hernia without obstruction or gangrene: Secondary | ICD-10-CM | POA: Insufficient documentation

## 2016-10-15 DIAGNOSIS — C50112 Malignant neoplasm of central portion of left female breast: Secondary | ICD-10-CM | POA: Diagnosis not present

## 2016-10-15 DIAGNOSIS — H409 Unspecified glaucoma: Secondary | ICD-10-CM | POA: Insufficient documentation

## 2016-10-15 NOTE — Progress Notes (Signed)
Complains of fatigue, loss of balance, and abd pain that is being managed by PCP at this time.

## 2016-12-14 ENCOUNTER — Other Ambulatory Visit: Payer: Self-pay | Admitting: Oncology

## 2017-02-27 ENCOUNTER — Ambulatory Visit
Admission: RE | Admit: 2017-02-27 | Discharge: 2017-02-27 | Disposition: A | Discharge status: Home / Self Care | Payer: Medicare Other | Source: Ambulatory Visit | Attending: Oncology | Admitting: Oncology

## 2017-02-27 DIAGNOSIS — C50112 Malignant neoplasm of central portion of left female breast: Secondary | ICD-10-CM | POA: Diagnosis present

## 2017-03-14 ENCOUNTER — Other Ambulatory Visit: Payer: Self-pay | Admitting: Oncology

## 2017-04-16 NOTE — Progress Notes (Signed)
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 She continues to tolerate letrozole and Fosamax well without significant side effects.  She continues to have chronic weakness and fatigue. She denies any pain.  She has no neurologic complaints.  She has a good appetite and denies weight loss.  She has no chest pain or shortness of breath.  She denies any nausea, vomiting, constipation, or diarrhea.  She has no urinary complaints.  Patient offers no further specific complaints today.   REVIEW OF SYSTEMS:   Review of Systems  Constitutional: Positive for malaise/fatigue. Negative for fever and weight loss.  Respiratory: Negative for cough and shortness of breath.   Cardiovascular: Negative.  Negative for chest pain and leg swelling.  Gastrointestinal: Negative.  Negative for abdominal pain.  Genitourinary: Negative.   Musculoskeletal: Positive for joint pain.  Skin: Negative.  Negative for rash.  Neurological: Positive for weakness. Negative for sensory change.  Psychiatric/Behavioral: Negative.  The patient is not nervous/anxious.     As per HPI. Otherwise, a complete review of systems is negative.  PAST MEDICAL HISTORY: Past Medical History:  Diagnosis Date  . Arthritis   . Breast cancer ([place]) 2013   left breast, radiation and lumpectomy  . Cataract   . Depression   . Fatigue   . GERD (gastroesophageal reflux disease)   . Glaucoma   . Heart murmur   . Heart palpitations   . Hiatal hernia   . HOH (hard of hearing)    AIDS  . Hyperlipidemia   . Hypertension   . Hypothyroidism   .  Left ankle injury    Old left ankle injury  . Left bundle branch block   . Neuropathy   . Osteoporosis   . Pacemaker   . Palpitations   . Scoliosis   . Shortness of breath dyspnea   . Thyroid nodule   . TIA (transient ischemic attack)     PAST SURGICAL HISTORY: Past Surgical History:  Procedure Laterality Date  . ABDOMINAL HYSTERECTOMY    . BREAST BIOPSY Left 2005   negative  . BREAST EXCISIONAL BIOPSY Left 2013   stereotactic biopsy positive  . BREAST LUMPECTOMY Left 02/21/2012   positive  . BREAST SURGERY     LUMPECTOMY  . CATARACT EXTRACTION W/PHACO Right 03/01/2015   Procedure: CATARACT EXTRACTION PHACO AND INTRAOCULAR LENS PLACEMENT (IOC);  Surgeon: Birder Robson, MD;  Location: [facility];  Service: Ophthalmology;  Laterality: Right;  US:1:03.1 AP:22.8 CDE:14.37  Fluid lot # [id number]  . CATARACT EXTRACTION W/PHACO Left 03/22/2015   Procedure: CATARACT EXTRACTION PHACO AND INTRAOCULAR LENS PLACEMENT (IOC);  Surgeon: Birder Robson, MD;  Location: [facility] OR[facility];  Service: Ophthalmology;  Laterality: Left;  [place]: 01:01.0 AP%: 22.5% CDE: 13.75 Lot # [id number]  . MASTECTOMY PARTIAL / LUMPECTOMY    . THYROIDECTOMY, PARTIAL      FAMILY HISTORY Family History  Problem Relation Age of Onset  . Breast cancer Neg Hx        ADVANCED DIRECTIVES:    HEALTH MAINTENANCE: Social History  Substance Use Topics  . Smoking status: Never Smoker  . Smokeless tobacco: Never Used  .  Alcohol use No     Colonoscopy:  PAP:  Bone density:  Lipid panel:  Allergies  Allergen Reactions  . Alendronate Sodium Nausea Only  . Codeine Other (See Comments)  . Etodolac Other (See Comments)  . Zolpidem Other (See Comments)    Current Outpatient Prescriptions  Medication Sig Dispense Refill  . alendronate (FOSAMAX) 70 MG tablet TAKE 1 TABLET ONCE A WEEK 12 tablet 2  . aspirin EC 81 MG tablet Take by mouth.    . calcium carbonate (OSCAL) 1500 (600 Ca) MG TABS tablet Take 600 mg of  elemental calcium by mouth daily with breakfast.    . cholecalciferol (VITAMIN D) 400 units TABS tablet Take 400 Units by mouth daily.    Marland Kitchen latanoprost (XALATAN) 0.005 % ophthalmic solution Place 1 drop into both eyes at bedtime.    Marland Kitchen letrozole (FEMARA) 2.5 MG tablet TAKE 1 TABLET DAILY 90 tablet 0  . losartan (COZAAR) 100 MG tablet Take 100 mg by mouth daily.    . pravastatin (PRAVACHOL) 20 MG tablet 20 mg daily.     . vitamin B-12 (CYANOCOBALAMIN) 100 MCG tablet Take 100 mcg by mouth daily.    . ranitidine (ZANTAC) 150 MG tablet Take 150 mg by mouth 2 (two) times daily.      No current facility-administered medications for this visit.     OBJECTIVE: Vitals:   04/17/17 1414  BP: (!) 176/96  Pulse: 99  Resp: 18  Temp: 97.9 F (36.6 C)     Body mass index is 26.93 kg/m.    ECOG FS:1 - Symptomatic but completely ambulatory  General: Well-developed, well-nourished, no acute distress. Eyes: anicteric sclera. Breasts: Bilateral breasts and axilla without lumps or masses. Patient refused exam today. Lungs: Clear to auscultation bilaterally. Heart: Regular rate and rhythm. No rubs, murmurs, or gallops. Abdomen: Soft, nontender, nondistended. No organomegaly noted, normoactive bowel sounds. Musculoskeletal: No edema, cyanosis, or clubbing. Neuro: Alert, answering all questions appropriately. Cranial nerves grossly intact. Skin: No rashes or petechiae noted. Psych: Normal affect.   LAB RESULTS:  Lab Results  Component Value Date   NA 138 06/09/2013   K 3.9 06/09/2013   CL 105 06/09/2013   CO2 28 06/09/2013   GLUCOSE 135 (H) 06/09/2013   BUN 23 (H) 06/09/2013   CREATININE 0.99 06/09/2013   CALCIUM 9.0 06/09/2013   PROT 5.5 (L) 05/28/2013   ALBUMIN 2.6 (L) 05/28/2013   AST 30 05/28/2013   ALT 13 05/28/2013   ALKPHOS 67 05/28/2013   BILITOT 0.3 05/28/2013   GFRNONAA 51 (L) 06/09/2013   GFRAA 59 (L) 06/09/2013    Lab Results  Component Value Date   WBC 7.4 06/09/2013    NEUTROABS 5.8 06/09/2013   HGB 11.4 (L) 06/09/2013   HCT 33.7 (L) 06/09/2013   MCV 87 06/09/2013   PLT 406 06/09/2013     STUDIES: No results found.  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  Continue letrozole daily for 5 years completing in November 2018.  After lengthy discussion with the patient, she wishes to minimize her physician appointments given her advanced age and it was agreed upon that no further follow-up is necessary. She has been instructed to complete her 5 years of treatment  as above. Her most recent mammogram on February 27, 2017 was reported as BI-RADS 2. Repeat in August 2019 which can now be ordered by her primary care physician.   2.  Osteoporosis: Bone mineral density from September 17, 2016 reported a T score of -2.7, which is essentially unchanged from one year ago.  Continue Fosamax, calcium and vitamin D supplementation. Repeat in February 2019.   Patient expressed understanding and was in agreement with this plan. She also understands that She can call clinic at any time with any questions, concerns, or complaints.   Breast cancer   Staging form: Breast, AJCC 7th Edition     Clinical stage from 02/02/2015: Stage IA (T1b, N0, M0) - Signed by Lloyd Huger, MD on 02/02/2015   Lloyd Huger, MD   04/21/2017 8:50 AM

## 2017-04-17 ENCOUNTER — Inpatient Hospital Stay: Payer: Medicare Other | Attending: Oncology | Admitting: Oncology

## 2017-04-17 VITALS — BP 176/96 | HR 99 | Temp 97.9°F | Resp 18 | Wt 137.9 lb

## 2017-04-17 DIAGNOSIS — I447 Left bundle-branch block, unspecified: Secondary | ICD-10-CM | POA: Insufficient documentation

## 2017-04-17 DIAGNOSIS — M419 Scoliosis, unspecified: Secondary | ICD-10-CM | POA: Insufficient documentation

## 2017-04-17 DIAGNOSIS — C50112 Malignant neoplasm of central portion of left female breast: Secondary | ICD-10-CM | POA: Insufficient documentation

## 2017-04-17 DIAGNOSIS — Z7982 Long term (current) use of aspirin: Secondary | ICD-10-CM | POA: Insufficient documentation

## 2017-04-17 DIAGNOSIS — K219 Gastro-esophageal reflux disease without esophagitis: Secondary | ICD-10-CM | POA: Insufficient documentation

## 2017-04-17 DIAGNOSIS — G629 Polyneuropathy, unspecified: Secondary | ICD-10-CM | POA: Diagnosis not present

## 2017-04-17 DIAGNOSIS — K449 Diaphragmatic hernia without obstruction or gangrene: Secondary | ICD-10-CM | POA: Diagnosis not present

## 2017-04-17 DIAGNOSIS — I10 Essential (primary) hypertension: Secondary | ICD-10-CM | POA: Diagnosis not present

## 2017-04-17 DIAGNOSIS — M818 Other osteoporosis without current pathological fracture: Secondary | ICD-10-CM

## 2017-04-17 DIAGNOSIS — E039 Hypothyroidism, unspecified: Secondary | ICD-10-CM | POA: Insufficient documentation

## 2017-04-17 DIAGNOSIS — E785 Hyperlipidemia, unspecified: Secondary | ICD-10-CM | POA: Insufficient documentation

## 2017-04-17 DIAGNOSIS — Z79899 Other long term (current) drug therapy: Secondary | ICD-10-CM | POA: Insufficient documentation

## 2017-04-17 DIAGNOSIS — Z170 Estrogen receptor positive status [ER+]: Secondary | ICD-10-CM | POA: Insufficient documentation

## 2017-04-17 DIAGNOSIS — H9190 Unspecified hearing loss, unspecified ear: Secondary | ICD-10-CM | POA: Insufficient documentation

## 2017-04-17 DIAGNOSIS — Z8673 Personal history of transient ischemic attack (TIA), and cerebral infarction without residual deficits: Secondary | ICD-10-CM | POA: Diagnosis not present

## 2017-04-17 DIAGNOSIS — Z79811 Long term (current) use of aromatase inhibitors: Secondary | ICD-10-CM | POA: Diagnosis not present

## 2017-04-17 DIAGNOSIS — F329 Major depressive disorder, single episode, unspecified: Secondary | ICD-10-CM | POA: Diagnosis not present

## 2017-04-17 DIAGNOSIS — Z950 Presence of cardiac pacemaker: Secondary | ICD-10-CM | POA: Diagnosis not present

## 2017-04-17 DIAGNOSIS — H409 Unspecified glaucoma: Secondary | ICD-10-CM | POA: Insufficient documentation

## 2017-04-17 NOTE — Progress Notes (Signed)
Patient here today for follow up regarding breast cancer. Patient reports she has been experiencing episodes of "profuse sweating" over the past 3 months. Patient has been seen by Dr. Lavera Guise, her PCP regarding sweating.

## 2017-05-22 ENCOUNTER — Other Ambulatory Visit: Payer: Self-pay | Admitting: Cardiology

## 2017-05-22 ENCOUNTER — Other Ambulatory Visit: Payer: Self-pay | Admitting: Internal Medicine

## 2017-05-22 ENCOUNTER — Ambulatory Visit
Admission: RE | Admit: 2017-05-22 | Discharge: 2017-05-22 | Disposition: A | Discharge status: Home / Self Care | Payer: Medicare Other | Source: Ambulatory Visit | Attending: Internal Medicine | Admitting: Internal Medicine

## 2017-05-22 DIAGNOSIS — I700 Atherosclerosis of aorta: Secondary | ICD-10-CM | POA: Diagnosis not present

## 2017-05-22 DIAGNOSIS — R0602 Shortness of breath: Secondary | ICD-10-CM | POA: Diagnosis present

## 2017-05-22 DIAGNOSIS — I517 Cardiomegaly: Secondary | ICD-10-CM | POA: Diagnosis not present

## 2017-06-17 ENCOUNTER — Other Ambulatory Visit: Payer: Self-pay | Admitting: Oncology

## 2017-07-31 DIAGNOSIS — R2681 Unsteadiness on feet: Secondary | ICD-10-CM | POA: Insufficient documentation

## 2017-07-31 DIAGNOSIS — R202 Paresthesia of skin: Secondary | ICD-10-CM | POA: Insufficient documentation

## 2017-07-31 DIAGNOSIS — G629 Polyneuropathy, unspecified: Secondary | ICD-10-CM | POA: Insufficient documentation

## 2017-08-01 ENCOUNTER — Other Ambulatory Visit: Payer: Self-pay | Admitting: Neurology

## 2017-08-01 DIAGNOSIS — I8290 Acute embolism and thrombosis of unspecified vein: Secondary | ICD-10-CM

## 2017-08-08 ENCOUNTER — Ambulatory Visit
Admission: RE | Admit: 2017-08-08 | Discharge: 2017-08-08 | Disposition: A | Discharge status: Home / Self Care | Payer: Medicare Other | Source: Ambulatory Visit | Attending: Neurology | Admitting: Neurology

## 2017-08-08 ENCOUNTER — Other Ambulatory Visit: Payer: Self-pay | Admitting: Neurology

## 2017-08-08 DIAGNOSIS — M79604 Pain in right leg: Secondary | ICD-10-CM | POA: Insufficient documentation

## 2017-08-08 DIAGNOSIS — M79605 Pain in left leg: Secondary | ICD-10-CM | POA: Diagnosis present

## 2017-08-08 DIAGNOSIS — M7122 Synovial cyst of popliteal space [Baker], left knee: Secondary | ICD-10-CM | POA: Diagnosis not present

## 2018-02-04 ENCOUNTER — Other Ambulatory Visit: Payer: Self-pay | Admitting: Internal Medicine

## 2018-02-04 DIAGNOSIS — Z1231 Encounter for screening mammogram for malignant neoplasm of breast: Secondary | ICD-10-CM

## 2018-02-26 ENCOUNTER — Other Ambulatory Visit: Payer: Self-pay | Admitting: Oncology

## 2018-03-03 ENCOUNTER — Ambulatory Visit
Admission: RE | Admit: 2018-03-03 | Discharge: 2018-03-03 | Disposition: A | Discharge status: Home / Self Care | Payer: Medicare Other | Source: Ambulatory Visit | Attending: Internal Medicine | Admitting: Internal Medicine

## 2018-03-03 DIAGNOSIS — Z1231 Encounter for screening mammogram for malignant neoplasm of breast: Secondary | ICD-10-CM

## 2018-08-05 ENCOUNTER — Other Ambulatory Visit: Payer: Self-pay | Admitting: Internal Medicine

## 2018-08-05 ENCOUNTER — Ambulatory Visit
Admission: RE | Admit: 2018-08-05 | Discharge: 2018-08-05 | Disposition: A | Discharge status: Home / Self Care | Payer: Medicare Other | Attending: Internal Medicine | Admitting: Internal Medicine

## 2018-08-05 ENCOUNTER — Ambulatory Visit
Admission: RE | Admit: 2018-08-05 | Discharge: 2018-08-05 | Disposition: A | Discharge status: Home / Self Care | Payer: Medicare Other | Source: Ambulatory Visit | Attending: Internal Medicine | Admitting: Internal Medicine

## 2018-08-05 DIAGNOSIS — Z01818 Encounter for other preprocedural examination: Secondary | ICD-10-CM | POA: Diagnosis present

## 2018-11-12 ENCOUNTER — Inpatient Hospital Stay: Admission: RE | Admit: 2018-11-12 | Payer: Medicare Other | Source: Ambulatory Visit

## 2018-11-24 ENCOUNTER — Inpatient Hospital Stay: Admit: 2018-11-24 | Payer: Medicare Other | Admitting: Orthopedic Surgery

## 2018-11-24 SURGERY — ARTHROPLASTY, KNEE, TOTAL, USING IMAGELESS COMPUTER-ASSISTED NAVIGATION
Anesthesia: Choice | Laterality: Right

## 2019-02-02 ENCOUNTER — Other Ambulatory Visit: Payer: Self-pay | Admitting: Internal Medicine

## 2019-02-02 DIAGNOSIS — Z1231 Encounter for screening mammogram for malignant neoplasm of breast: Secondary | ICD-10-CM

## 2019-03-02 NOTE — Progress Notes (Signed)
Patient's Name: Sue Knapp  MRN: [id number]  Referring Provider: Kubinski, Andrew J, DO  DOB: 11/12/1922  PCP: Masoud, Javed, MD  DOS: 03/03/2019  Note by: Bilal Lateef, MD  Service setting: Ambulatory outpatient  Specialty: Interventional Pain Management  Location: [facility] (AMB) Pain Management Facility  Visit type: Initial Patient Evaluation  Patient type: New Patient   Primary Reason(s) for Visit: Encounter for initial evaluation of one or more chronic problems (new to examiner) potentially causing chronic pain, and posing a threat to normal musculoskeletal function. (Level of risk: High) CC: Knee Pain (bilateral, worse in right) and Shoulder Pain (bilateral, worse in right)  HPI  Sue Knapp is a 85 y.o. year old, female patient, who comes today to see us for the first time for an initial evaluation of her chronic pain. She has Cancer of central portion of left breast (HCC); Primary osteoarthritis of right knee; Primary osteoarthritis of left knee; Bilateral primary osteoarthritis of knee; Osteoarthritis of right shoulder; and Chronic pain syndrome on their problem list. Today she comes in for evaluation of her Knee Pain (bilateral, worse in right) and Shoulder Pain (bilateral, worse in right)  Pain Assessment: Location: Right, Left Knee Radiating: denies Onset: More than a month ago Duration: Chronic pain Quality: Constant Severity: 8 /10 (subjective, self-reported pain score)  Note: Reported level is compatible with observation.                         When using our objective Pain Scale, levels between 6 and 10/10 are said to belong in an emergency room, as it progressively worsens from a 6/10, described as severely limiting, requiring emergency care not usually available at an outpatient pain management facility. At a 6/10 level, communication becomes difficult and requires great effort. Assistance to reach the emergency department may be required. Facial flushing and profuse sweating  along with potentially dangerous increases in heart rate and blood pressure will be evident. Effect on ADL: difficulty performing daily activities Timing: Constant Modifying factors: Tylenol, Advil, Voltaren gel, rest/sitting BP: 139/60  HR: 71  Onset and Duration: Gradual Cause of pain: Unknown Severity: Getting worse Timing: Not influenced by the time of the day Aggravating Factors: Bending, Climbing, Kneeling, Lifiting, Motion, Prolonged sitting, Prolonged standing, Squatting, Stooping , Twisting, Walking, Walking uphill and Walking downhill Alleviating Factors: Resting and Sitting Associated Problems: Weakness Quality of Pain: Aching, Agonizing, Constant, Cramping, Punishing, Stabbing, Throbbing and Uncomfortable Previous Examinations or Tests: X-rays Previous Treatments: Physical Therapy, accupuncture, OT, knee injections  Patient is a pleasant 85-year-old female with a history of bilateral knee osteoarthritis status post intra-articular knee Synvisc injections.  Patient states that the injection was helpful for her left knee pain but not as much for her right knee pain.  She is being sent here by orthopedics for consideration of genicular nerve block.  Patient describes pain along the medial and inferior portion of her patella and states that it is worse with weightbearing.  She has radiographic evidence of degenerative joint disease.  Patient also has a history of right shoulder pain.  She states that it is worse with raising her arm up.  She cannot ABduct beyond 90 degrees.  She does have a history of rotator cuff dysfunction. Meds   Current Outpatient Medications:  .  acetaminophen (TYLENOL) 650 MG CR tablet, Take 1,300 mg by mouth 2 (two) times daily., Disp: , Rfl:  .  alendronate (FOSAMAX) 70 MG tablet, TAKE 1 TABLET ONCE A   WEEK (Patient taking differently: Take 70 mg by mouth once a week. ), Disp: 12 tablet, Rfl: 2 .  aspirin EC 81 MG tablet, Take 81 mg by mouth every 7 (seven)  days. , Disp: , Rfl:  .  calcium-vitamin D (OSCAL WITH D) 500-200 MG-UNIT tablet, Take 1 tablet by mouth daily with breakfast., Disp: , Rfl:  .  Cyanocobalamin (VITAMIN B-12 PO), Take 2,500 mcg by mouth daily. , Disp: , Rfl:  .  diclofenac sodium (VOLTAREN) 1 % GEL, Apply topically 4 (four) times daily., Disp: , Rfl:  .  gabapentin (NEURONTIN) 100 MG capsule, Take 100 mg by mouth 3 (three) times daily., Disp: , Rfl:  .  hydrochlorothiazide (HYDRODIURIL) 25 MG tablet, Take 25 mg by mouth daily., Disp: , Rfl:  .  ibuprofen (ADVIL,MOTRIN) 200 MG tablet, Take 200 mg by mouth 2 (two) times daily., Disp: , Rfl:  .  latanoprost (XALATAN) 0.005 % ophthalmic solution, Place 1 drop into both eyes at bedtime., Disp: , Rfl:  .  losartan (COZAAR) 100 MG tablet, Take 100 mg by mouth daily., Disp: , Rfl:  .  metoprolol succinate (TOPROL-XL) 50 MG 24 hr tablet, Take 50 mg by mouth daily. , Disp: , Rfl:  .  Multiple Vitamin (MULTIVITAMIN WITH MINERALS) TABS tablet, Take 1 tablet by mouth daily., Disp: , Rfl:  .  vitamin C (ASCORBIC ACID) 500 MG tablet, Take 500 mg by mouth daily., Disp: , Rfl:  .  ALPRAZolam (XANAX) 0.25 MG tablet, Take 0.25 mg by mouth at bedtime., Disp: , Rfl:  .  Cholecalciferol 50 MCG (2000 UT) TABS, Take 2,000 Units by mouth daily. , Disp: , Rfl:  .  ferrous sulfate 325 (65 FE) MG tablet, Take 325 mg by mouth 2 (two) times a week., Disp: , Rfl:  .  letrozole (FEMARA) 2.5 MG tablet, TAKE 1 TABLET DAILY (Patient not taking: Reported on 10/30/2018), Disp: 90 tablet, Rfl: 0  Imaging Review  RIGHT KNEE: There is narrowing  of the medial cartilage space with bone-on-bone articulation and  associated varus alignment. Osteophyte formation is noted. Subchondral  sclerosis is noted. No evidence of fracture or dislocation.  Left knee:  Good preservation of  the cartilage space. No chondrocalcinosis is appreciated. No significant  osteophyte formation or subchondral sclerosis. No evidence of  fracture or  dislocation.  ROS  Cardiovascular: Abnormal heart rhythm, Daily Aspirin intake, High blood pressure, Pacemaker or defibrillator and Heart murmur Pulmonary or Respiratory: Shortness of breath Neurological: Abnormal skin sensations (Peripheral Neuropathy) and Curved spine, TIA Review of Past Neurological Studies:  Results for orders placed or performed during the hospital encounter of 01/27/15  CT Head Wo Contrast   Narrative   CLINICAL DATA:  89-year-old female with balance problems worsening over the past few years. History of breast cancer. Pacemaker. Subsequent encounter.  EXAM: CT HEAD WITHOUT CONTRAST  TECHNIQUE: Contiguous axial images were obtained from the base of the skull through the vertex without intravenous contrast.  COMPARISON:  04/10/2013 CT and MR.  FINDINGS: No intracranial hemorrhage.  Mild small vessel disease type changes without CT evidence large acute infarct.  No hydrocephalus.  Vascular calcifications.  No intracranial mass lesion noted on this unenhanced exam.  No calvarial abnormality.  IMPRESSION: No intracranial hemorrhage.  Mild small vessel disease type changes without CT evidence large acute infarct.  No hydrocephalus.  Vascular calcifications.   Electronically Signed   By: Steven  Olson M.D.   On: 01/27/2015 15:06    Psychological-Psychiatric: Depressed   Gastrointestinal: Heartburn due to stomach pushing into lungs (Hiatal hernia) and Irregular, infrequent bowel movements (Constipation) Genitourinary: No reported renal or genitourinary signs or symptoms such as difficulty voiding or producing urine, peeing blood, non-functioning kidney, kidney stones, difficulty emptying the bladder, difficulty controlling the flow of urine, or chronic kidney disease Hematological: No reported hematological signs or symptoms such as prolonged bleeding, low or poor functioning platelets, bruising or bleeding easily, hereditary bleeding  problems, low energy levels due to low hemoglobin or being anemic Endocrine: Slow thyroid Rheumatologic: No reported rheumatological signs and symptoms such as fatigue, joint pain, tenderness, swelling, redness, heat, stiffness, decreased range of motion, with or without associated rash Musculoskeletal: Negative for myasthenia gravis, muscular dystrophy, multiple sclerosis or malignant hyperthermia Work History: Retired  Allergies  Ms. Nienow is allergic to codeine; etodolac; and zolpidem.  Laboratory Chemistry Profile   Screening No results found for: SARSCOV2NAA, COVIDSOURCE, STAPHAUREUS, MRSAPCR, HCVAB, HIV, PREGTESTUR  Inflammation (CRP: Acute Phase) (ESR: Chronic Phase) No results found for: CRP, ESRSEDRATE, LATICACIDVEN                       Rheumatology No results found for: RF, ANA, LABURIC, URICUR, LYMEIGGIGMAB, LYMEABIGMQN, HLAB27                      Renal Lab Results  Component Value Date   BUN 23 (H) 06/09/2013   CREATININE 0.99 06/09/2013   GFRAA 59 (L) 06/09/2013   GFRNONAA 51 (L) 06/09/2013                             Hepatic Lab Results  Component Value Date   AST 30 05/28/2013   ALT 13 05/28/2013   ALBUMIN 2.6 (L) 05/28/2013   ALKPHOS 67 05/28/2013                        Electrolytes Lab Results  Component Value Date   NA 138 06/09/2013   K 3.9 06/09/2013   CL 105 06/09/2013   CALCIUM 9.0 06/09/2013                        Neuropathy No results found for: VITAMINB12, FOLATE, HGBA1C, HIV                      CNS No results found for: COLORCSF, APPEARCSF, RBCCOUNTCSF, WBCCSF, POLYSCSF, LYMPHSCSF, EOSCSF, PROTEINCSF, GLUCCSF, JCVIRUS, CSFOLI, IGGCSF, LABACHR, ACETBL                      Bone No results found for: VD25OH, VD125OH2TOT, VD3125OH2, VD2125OH2, 25OHVITD1, 25OHVITD2, 25OHVITD3, TESTOFREE, TESTOSTERONE                       Coagulation Lab Results  Component Value Date   INR 1.1 05/25/2013   LABPROT 13.9 05/25/2013   APTT 30.5  05/25/2013   PLT 406 06/09/2013                        Cardiovascular Lab Results  Component Value Date   CKTOTAL 61 05/24/2013   CKMB 1.1 05/24/2013   TROPONINI 0.08 (H) 05/24/2013   HGB 11.4 (L) 06/09/2013   HCT 33.7 (L) 06/09/2013                           ID No results found for: LYMEIGGIGMAB, HIV, Martin, STAPHAUREUS, MRSAPCR, HCVAB, PREGTESTUR, MICROTEXT  Cancer No results found for: CEA, CA125, LABCA2                      Endocrine Lab Results  Component Value Date   TSH 1.84 05/23/2013                        Note: Lab results reviewed.  [place]  Drug: Ms. Manka  reports no history of drug use. Alcohol:  reports no history of alcohol use. Tobacco:  reports that she has never smoked. She has never used smokeless tobacco. Medical:  has a past medical history of Arthritis, Breast cancer ([place]) (2013), Cataract, Depression, Fatigue, GERD (gastroesophageal reflux disease), Glaucoma, Heart murmur, Heart palpitations, Hiatal hernia, HOH (hard of hearing), Hyperlipidemia, Hypertension, Hypothyroidism, Left ankle injury, Left bundle branch block, Neuropathy, Osteoporosis, Pacemaker, Palpitations, Scoliosis, Shortness of breath dyspnea, Thyroid nodule, and TIA (transient ischemic attack). Family: family history is not on file.  Past Surgical History:  Procedure Laterality Date  . ABDOMINAL HYSTERECTOMY    . BREAST BIOPSY Left 2005   negative  . BREAST EXCISIONAL BIOPSY Left 2013   stereotactic biopsy positive  . BREAST LUMPECTOMY Left 02/21/2012   positive  . BREAST SURGERY     LUMPECTOMY  . CATARACT EXTRACTION W/PHACO Right 03/01/2015   Procedure: CATARACT EXTRACTION PHACO AND INTRAOCULAR LENS PLACEMENT (IOC);  Surgeon: Birder Robson, MD;  Location: [facility] ORS;  Service: Ophthalmology;  Laterality: Right;  US:1:03.1 AP:22.8 CDE:14.37  Fluid lot # [id number]  . CATARACT EXTRACTION W/PHACO Left 03/22/2015   Procedure: CATARACT EXTRACTION PHACO AND INTRAOCULAR LENS  PLACEMENT (IOC);  Surgeon: Birder Robson, MD;  Location: [facility] ORS;  Service: Ophthalmology;  Laterality: Left;  [place]: 01:01.0 AP%: 22.5% CDE: 13.75 Lot # [id number]  . MASTECTOMY PARTIAL / LUMPECTOMY    . THYROIDECTOMY, PARTIAL     Active Ambulatory Problems    Diagnosis Date Noted  . Cancer of central portion of left breast ([place]) 02/02/2015  . Primary osteoarthritis of right knee 03/03/2019  . Primary osteoarthritis of left knee 03/03/2019  . Bilateral primary osteoarthritis of knee 03/03/2019  . Osteoarthritis of right shoulder 03/03/2019  . Chronic pain syndrome 03/03/2019   Resolved Ambulatory Problems    Diagnosis Date Noted  . No Resolved Ambulatory Problems   Past Medical History:  Diagnosis Date  . Arthritis   . Breast cancer ([place]) 2013  . Cataract   . Depression   . Fatigue   . GERD (gastroesophageal reflux disease)   . Glaucoma   . Heart murmur   . Heart palpitations   . Hiatal hernia   . HOH (hard of hearing)   . Hyperlipidemia   . Hypertension   . Hypothyroidism   . Left ankle injury   . Left bundle branch block   . Neuropathy   . Osteoporosis   . Pacemaker   . Palpitations   . Scoliosis   . Shortness of breath dyspnea   . Thyroid nodule   . TIA (transient ischemic attack)    Constitutional Exam  General appearance: alert, cooperative and slowed mentation Vitals:   03/03/19 1114  BP: 139/60  Pulse: 71  Resp: 16  Temp: 98.3 F (36.8 C)  TempSrc: Oral  SpO2: 98%  Weight: 137 lb (62.1 kg)  Height: 4' 11" (1.499 m)   BMI Assessment: Estimated body mass index is 27.67 kg/m as calculated  from the following:   Height as of this encounter: 4' 11" (1.499 m).   Weight as of this encounter: 137 lb (62.1 kg).  BMI interpretation table: BMI level Category Range association with higher incidence of chronic pain  <18 kg/m2 Underweight   18.5-24.9 kg/m2 Ideal body weight   25-29.9 kg/m2 Overweight Increased incidence by 20%  30-34.9 kg/m2 Obese (Class  I) Increased incidence by 68%  35-39.9 kg/m2 Severe obesity (Class II) Increased incidence by 136%  >40 kg/m2 Extreme obesity (Class III) Increased incidence by 254%   Patient's current BMI Ideal Body weight  Body mass index is 27.67 kg/m. Patient must be at least 60 in tall to calculate ideal body weight   BMI Readings from Last 4 Encounters:  03/03/19 27.67 kg/m  04/17/17 26.93 kg/m  10/15/16 26.76 kg/m  03/21/16 26.69 kg/m   Wt Readings from Last 4 Encounters:  03/03/19 137 lb (62.1 kg)  04/17/17 137 lb 14.4 oz (62.6 kg)  10/15/16 137 lb (62.1 kg)  03/21/16 136 lb 11 oz (62 kg)  Psych/Mental status: Alert, oriented x 3 (person, place, & time)       Eyes: PERLA Respiratory: No evidence of acute respiratory distress    Thoracic Spine Area Exam  Skin & Axial Inspection: No masses, redness, or swelling Alignment: Symmetrical Functional ROM: Unrestricted ROM Stability: No instability detected Muscle Tone/Strength: Functionally intact. No obvious neuro-muscular anomalies detected. Sensory (Neurological): Musculoskeletal pain pattern Muscle strength & Tone: No palpable anomalies  Lumbar Spine Area Exam  Skin & Axial Inspection: No masses, redness, or swelling Alignment: Symmetrical Functional ROM: Pain restricted ROM       Stability: No instability detected Muscle Tone/Strength: Functionally intact. No obvious neuro-muscular anomalies detected. Sensory (Neurological): Musculoskeletal pain pattern Palpation: No palpable anomalies       Provocative Tests: Hyperextension/rotation test: deferred today       Lumbar quadrant test (Kemp's test): deferred today       Lateral bending test: deferred today       Patrick's Maneuver: deferred today                   FABER* test: deferred today                   S-I anterior distraction/compression test: deferred today         S-I lateral compression test: deferred today         S-I Thigh-thrust test: deferred today         S-I  Gaenslen's test: deferred today         *(Flexion, ABduction and External Rotation)  Gait & Posture Assessment  Ambulation: Patient came in today in a wheel chair Gait: Limited. Using assistive device to ambulate Posture: Difficulty standing up straight, due to pain   Lower Extremity Exam    Side: Right lower extremity  Side: Left lower extremity  Stability: No instability observed          Stability: No instability observed          Skin & Extremity Inspection: brace in place  Skin & Extremity Inspection: Atrophy  Functional ROM: Pain restricted ROM for knee joint          Functional ROM: Pain restricted ROM for knee joint          Muscle Tone/Strength: Functionally intact. No obvious neuro-muscular anomalies detected.  Muscle Tone/Strength: Functionally intact. No obvious neuro-muscular anomalies detected.  Sensory (Neurological): Arthropathic arthralgia          Sensory (Neurological): Arthropathic arthralgia        DTR: Patellar: 0: absent Achilles: 0: absent Plantar: deferred today  DTR: Patellar: 0: absent Achilles: 0: absent Plantar: deferred today  Palpation: No palpable anomalies  Palpation: No palpable anomalies   Assessment  Primary Diagnosis & Pertinent Problem List: The primary encounter diagnosis was Primary osteoarthritis of right knee. Diagnoses of Primary osteoarthritis of left knee, Bilateral primary osteoarthritis of knee, Osteoarthritis of right shoulder due to rotator cuff injury, Osteoarthritis of right shoulder, unspecified osteoarthritis type, and Chronic pain syndrome were also pertinent to this visit.  Visit Diagnosis (New problems to examiner): 1. Primary osteoarthritis of right knee   2. Primary osteoarthritis of left knee   3. Bilateral primary osteoarthritis of knee   4. Osteoarthritis of right shoulder due to rotator cuff injury   5. Osteoarthritis of right shoulder, unspecified osteoarthritis type   6. Chronic pain syndrome    Plan of Care (Initial  workup plan)  85 year old female with a history of bilateral knee osteoarthritis, right greater than left who is being referred from orthopedics for consideration of bilateral genicular nerve block followed by radiofrequency ablation.  Patient is status post Visco supplementation with orthopedics which was helpful for her left knee but not so much her right knee.  Risk and benefits of genicular nerve block reviewed as well as radiofrequency ablation.  Patient would like to proceed.  For the patient's right shoulder pain related to right shoulder osteoarthritis, we discussed diagnostic intra-articular shoulder steroid injection and diagnostic right suprascapular nerve block.  Risks and benefits reviewed.    After genicular nerve block, can consider shoulder intervention starting with intra-articular steroid injection first.   Procedure Orders     GENICULAR NERVE BLOCK  Interventional management options: Ms. Nowland was informed that there is no guarantee that she would be a candidate for interventional therapies. The decision will be based on the results of diagnostic studies, as well as Ms. Brouse's risk profile.  Procedure(s) under consideration:  Genicular nerve block without sedation bilateral Genicular nerve radiofrequency ablation, bilateral Right intra-articular shoulder steroid injection (glenohumeral joint) Right suprascapular nerve block Right suprascapular pulsed radiofrequency ablation   Provider-requested follow-up: Return for Procedure Genicular Nerve Block w/o sedation ASAP.  Future Appointments  Date Time Provider [place]  03/09/2019  1:15 PM Gillis Santa, MD [facility] None  03/12/2019 11:40 AM [facility]    Primary Care Physician: Cletis Athens, MD Location: [facility] Note by: Gillis Santa, MD Date: 03/03/2019; Time: 3:53 PM  Note: This dictation was prepared with Dragon dictation. Any transcriptional errors that may  result from this process are unintentional.

## 2019-03-03 ENCOUNTER — Ambulatory Visit
Payer: Medicare Other | Attending: Student in an Organized Health Care Education/Training Program | Admitting: Student in an Organized Health Care Education/Training Program

## 2019-03-03 ENCOUNTER — Encounter: Payer: Self-pay | Admitting: Student in an Organized Health Care Education/Training Program

## 2019-03-03 ENCOUNTER — Other Ambulatory Visit: Payer: Self-pay

## 2019-03-03 VITALS — BP 139/60 | HR 71 | Temp 98.3°F | Resp 16 | Ht 59.0 in | Wt 137.0 lb

## 2019-03-03 DIAGNOSIS — S46001S Unspecified injury of muscle(s) and tendon(s) of the rotator cuff of right shoulder, sequela: Secondary | ICD-10-CM | POA: Diagnosis present

## 2019-03-03 DIAGNOSIS — G894 Chronic pain syndrome: Secondary | ICD-10-CM | POA: Insufficient documentation

## 2019-03-03 DIAGNOSIS — M19011 Primary osteoarthritis, right shoulder: Secondary | ICD-10-CM | POA: Diagnosis present

## 2019-03-03 DIAGNOSIS — M1711 Unilateral primary osteoarthritis, right knee: Secondary | ICD-10-CM | POA: Diagnosis present

## 2019-03-03 DIAGNOSIS — M170 Bilateral primary osteoarthritis of knee: Secondary | ICD-10-CM | POA: Insufficient documentation

## 2019-03-03 DIAGNOSIS — M19111 Post-traumatic osteoarthritis, right shoulder: Secondary | ICD-10-CM | POA: Insufficient documentation

## 2019-03-03 DIAGNOSIS — M1712 Unilateral primary osteoarthritis, left knee: Secondary | ICD-10-CM | POA: Insufficient documentation

## 2019-03-03 NOTE — Progress Notes (Signed)
Safety precautions to be maintained throughout the outpatient stay will include: orient to surroundings, keep bed in low position, maintain call bell within reach at all times, provide assistance with transfer out of bed and ambulation.  

## 2019-03-03 NOTE — Patient Instructions (Signed)
Sodium Hyaluronate intra-articular injection What is this medicine? SODIUM HYALURONATE (SOE dee um hye al yoor ON ate) is used to treat pain in the knee due to osteoarthritis. This medicine may be used for other purposes; ask your health care provider or pharmacist if you have questions. COMMON BRAND NAME(S): Amvisc, DUROLANE, Euflexxa, GELSYN-3, Hyalgan, Hymovis, Monovisc, Orthovisc, Supartz, Supartz FX, TriVisc, VISCO What should I tell my health care provider before I take this medicine? They need to know if you have any of these conditions:  bleeding disorders  glaucoma  infection in the knee joint  skin conditions or sensitivity  skin infection  an unusual allergic reaction to sodium hyaluronate, other medicines, foods, dyes, or preservatives. Different brands of sodium hyaluronate contain different allergens. Some may contain egg. Talk to your doctor about your allergies to make sure that you get the right product.  pregnant or trying to get pregnant  breast-feeding How should I use this medicine? This medicine is for injection into the knee joint. It is given by a health care professional in a hospital or clinic setting. Talk to your pediatrician regarding the use of this medicine in children. Special care may be needed. Overdosage: If you think you have taken too much of this medicine contact a poison control center or emergency room at once. NOTE: This medicine is only for you. Do not share this medicine with others. What if I miss a dose? This does not apply. What may interact with this medicine? Interactions are not expected. This list may not describe all possible interactions. Give your health care provider a list of all the medicines, herbs, non-prescription drugs, or dietary supplements you use. Also tell them if you smoke, drink alcohol, or use illegal drugs. Some items may interact with your medicine. What should I watch for while using this medicine? Tell your  doctor or healthcare professional if your symptoms do not start to get better or if they get worse. If receiving this medicine for osteoarthritis, limit your activity after you receive your injection. Avoid physical activity for 48 hours following your injection to keep your knee from swelling. Do not stand on your feet for more than 1 hour at a time during the first 48 hours following your injection. Ask your doctor or healthcare professional about when you can begin major physical activity again. What side effects may I notice from receiving this medicine? Side effects that you should report to your doctor or health care professional as soon as possible:  allergic reactions like skin rash, itching or hives, swelling of the face, lips, or tongue  dizziness  facial flushing  pain, tingling, numbness in the hands or feet  vision changes if received this medicine during eye surgery Side effects that usually do not require medical attention (report to your doctor or health care professional if they continue or are bothersome):  back pain  bruising at site where injected  chills  diarrhea  fever  headache  joint pain  joint stiffness  joint swelling  muscle cramps  muscle pain  nausea, vomiting  pain, redness, or irritation at site where injected  weak or tired This list may not describe all possible side effects. Call your doctor for medical advice about side effects. You may report side effects to FDA at 1-800-FDA-1088. Where should I keep my medicine? This drug is given in a hospital or clinic and will not be stored at home. NOTE: This sheet is a summary. It may not cover all   possible information. If you have questions about this medicine, talk to your doctor, pharmacist, or health care provider.  2020 Elsevier/Gold Standard (2015-08-11 08:34:51)  

## 2019-03-05 ENCOUNTER — Telehealth: Payer: Self-pay | Admitting: *Deleted

## 2019-03-05 NOTE — Telephone Encounter (Signed)
Spoke with daughter and all questions were answered.

## 2019-03-09 ENCOUNTER — Ambulatory Visit
Admission: RE | Admit: 2019-03-09 | Discharge: 2019-03-09 | Disposition: A | Discharge status: Home / Self Care | Payer: Medicare Other | Source: Ambulatory Visit | Attending: Student in an Organized Health Care Education/Training Program | Admitting: Student in an Organized Health Care Education/Training Program

## 2019-03-09 ENCOUNTER — Other Ambulatory Visit: Payer: Self-pay

## 2019-03-09 ENCOUNTER — Ambulatory Visit (HOSPITAL_BASED_OUTPATIENT_CLINIC_OR_DEPARTMENT_OTHER): Payer: Medicare Other | Admitting: Student in an Organized Health Care Education/Training Program

## 2019-03-09 ENCOUNTER — Encounter: Payer: Self-pay | Admitting: Student in an Organized Health Care Education/Training Program

## 2019-03-09 VITALS — BP 137/63 | HR 71 | Temp 98.2°F | Resp 16 | Ht 59.0 in | Wt 137.0 lb

## 2019-03-09 DIAGNOSIS — M1711 Unilateral primary osteoarthritis, right knee: Secondary | ICD-10-CM | POA: Diagnosis present

## 2019-03-09 DIAGNOSIS — S46001S Unspecified injury of muscle(s) and tendon(s) of the rotator cuff of right shoulder, sequela: Secondary | ICD-10-CM | POA: Insufficient documentation

## 2019-03-09 DIAGNOSIS — M170 Bilateral primary osteoarthritis of knee: Secondary | ICD-10-CM

## 2019-03-09 DIAGNOSIS — M19111 Post-traumatic osteoarthritis, right shoulder: Secondary | ICD-10-CM | POA: Insufficient documentation

## 2019-03-09 DIAGNOSIS — M1712 Unilateral primary osteoarthritis, left knee: Secondary | ICD-10-CM | POA: Insufficient documentation

## 2019-03-09 MED ORDER — ROPIVACAINE HCL 2 MG/ML IJ SOLN
1.0000 mL | Freq: Once | INTRAMUSCULAR | Status: AC
  Administered 2019-03-09: 14:00:00 1 mL via EPIDURAL

## 2019-03-09 MED ORDER — LIDOCAINE HCL 2 % IJ SOLN
20.0000 mL | Freq: Once | INTRAMUSCULAR | Status: AC
  Administered 2019-03-09: 400 mg

## 2019-03-09 MED ORDER — DEXAMETHASONE SODIUM PHOSPHATE 10 MG/ML IJ SOLN
INTRAMUSCULAR | Status: AC
  Filled 2019-03-09: qty 1

## 2019-03-09 MED ORDER — LIDOCAINE HCL 2 % IJ SOLN
INTRAMUSCULAR | Status: AC
  Filled 2019-03-09: qty 20

## 2019-03-09 MED ORDER — ROPIVACAINE HCL 2 MG/ML IJ SOLN
INTRAMUSCULAR | Status: AC
  Filled 2019-03-09: qty 10

## 2019-03-09 MED ORDER — DEXAMETHASONE SODIUM PHOSPHATE 10 MG/ML IJ SOLN
10.0000 mg | Freq: Once | INTRAMUSCULAR | Status: AC
  Administered 2019-03-09: 10 mg

## 2019-03-09 NOTE — Progress Notes (Signed)
Patient's Name: Sue Knapp  MRN: [id number]  Referring Provider: Cletis Athens, MD  DOB: 01/20/1923  PCP: Cletis Athens, MD  DOS: 03/09/2019  Note by: Gillis Santa, MD  Service setting: Ambulatory outpatient  Specialty: Interventional Pain Management  Patient type: Established  Location: [facility] (AMB) Pain Management Facility  Visit type: Interventional Procedure   Primary Reason for Visit: Interventional Pain Management Treatment. CC: Knee Pain (right)  Procedure:          Anesthesia, Analgesia, Anxiolysis:  Type: Genicular Nerves Block (Superior-lateral, Superior-medial, and Inferior-medial Genicular Nerves) #1  CPT: 50277      Primary Purpose: Diagnostic Region: Lateral, Anterior, and Medial aspects of the knee joint, above and below the knee joint proper. Level: Superior and inferior to the knee joint. Target Area: For Genicular Nerve block(s), the targets are: the superior-lateral genicular nerve, located in the lateral distal portion of the femoral shaft as it curves to form the lateral epicondyle, in the region of the distal femoral metaphysis; the superior-medial genicular nerve, located in the medial distal portion of the femoral shaft as it curves to form the medial epicondyle; and the inferior-medial genicular nerve, located in the medial, proximal portion of the tibial shaft, as it curves to form the medial epicondyle, in the region of the proximal tibial metaphysis. Approach: Anterior, percutaneous, ipsilateral approach. Laterality: Right knee  Type: Local Anesthesia  Local Anesthetic: Lidocaine 1-2%  Position: Modified Fowler's position with pillows under the targeted knee(s).   Indications: 1. Primary osteoarthritis of right knee   2. Primary osteoarthritis of left knee   3. Bilateral primary osteoarthritis of knee   4. 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  Knapp is a 85 y.o. (year old), female patient, seen today for interventional treatment. She  has a past surgical history that includes Thyroidectomy, partial; Abdominal hysterectomy; Mastectomy partial / lumpectomy; Breast surgery; Breast lumpectomy (Left, 02/21/2012); Cataract extraction w/PHACO (Right, 03/01/2015); Cataract extraction w/PHACO (Left, 03/22/2015); Breast biopsy (Left, 2005); and Breast excisional biopsy (Left, 2013). Sue Knapp has a current medication list which includes the following prescription(s): acetaminophen, alendronate, alprazolam, aspirin ec, calcium-vitamin d, cholecalciferol, cyanocobalamin, diclofenac sodium, ferrous sulfate, gabapentin, hydrochlorothiazide, ibuprofen, latanoprost, letrozole, losartan, metoprolol succinate, multivitamin with minerals, and vitamin c. Her primarily concern today is the Knee Pain (right)  Initial Vital Signs:  Pulse/HCG Rate: 76  Temp: 98.2 F (36.8 C) Resp: 16 BP: 129/60 SpO2: 99 %  BMI: Estimated body mass index is 27.67 kg/m as calculated from the following:   Height as of this encounter: 4\' 11"  (1.499 m).   Weight as of this encounter: 137 lb (62.1 kg).  Risk Assessment: Allergies: Reviewed. She is allergic to codeine; etodolac; and zolpidem.  Allergy Precautions: None required Coagulopathies: Reviewed. None identified.  Blood-thinner therapy: None at this time Active Infection(s): Reviewed. None identified. Sue Knapp is afebrile  Site Confirmation: Sue Knapp was asked to confirm the procedure and laterality before marking the site Procedure checklist: Completed Consent: Before the procedure and under the influence of no sedative(s), amnesic(s), or anxiolytics, the patient was informed of the treatment options, risks and possible complications. To fulfill our ethical and legal obligations, as recommended by the American Medical Association's Code of Ethics, I have informed the patient of my clinical impression; the nature and  purpose of the treatment or procedure; the risks, benefits, and possible complications of the intervention; the alternatives,  including doing nothing; the risk(s) and benefit(s) of the alternative treatment(s) or procedure(s); and the risk(s) and benefit(s) of doing nothing. The patient was provided information about the general risks and possible complications associated with the procedure. These may include, but are not limited to: failure to achieve desired goals, infection, bleeding, organ or nerve damage, allergic reactions, paralysis, and death. In addition, the patient was informed of those risks and complications associated to the procedure, such as failure to decrease pain; infection; bleeding; organ or nerve damage with subsequent damage to sensory, motor, and/or autonomic systems, resulting in permanent pain, numbness, and/or weakness of one or several areas of the body; allergic reactions; (i.e.: anaphylactic reaction); and/or death. Furthermore, the patient was informed of those risks and complications associated with the medications. These include, but are not limited to: allergic reactions (i.e.: anaphylactic or anaphylactoid reaction(s)); adrenal axis suppression; blood sugar elevation that in diabetics may result in ketoacidosis or comma; water retention that in patients with history of congestive heart failure may result in shortness of breath, pulmonary edema, and decompensation with resultant heart failure; weight gain; swelling or edema; medication-induced neural toxicity; particulate matter embolism and blood vessel occlusion with resultant organ, and/or nervous system infarction; and/or aseptic necrosis of one or more joints. Finally, the patient was informed that Medicine is not an exact science; therefore, there is also the possibility of unforeseen or unpredictable risks and/or possible complications that may result in a catastrophic outcome. The patient indicated having understood very  clearly. We have given the patient no guarantees and we have made no promises. Enough time was given to the patient to ask questions, all of which were answered to the patient's satisfaction. Ms. North has indicated that she wanted to continue with the procedure. Attestation: I, the ordering provider, attest that I have discussed with the patient the benefits, risks, side-effects, alternatives, likelihood of achieving goals, and potential problems during recovery for the procedure that I have provided informed consent. Date   Time: 03/09/2019 12:53 PM  Pre-Procedure Preparation:  Monitoring: As per clinic protocol. Respiration, ETCO2, SpO2, BP, heart rate and rhythm monitor placed and checked for adequate function Safety Precautions: Patient was assessed for positional comfort and pressure points before starting the procedure. Time-out: I initiated and conducted the "Time-out" before starting the procedure, as per protocol. The patient was asked to participate by confirming the accuracy of the "Time Out" information. Verification of the correct person, site, and procedure were performed and confirmed by me, the nursing staff, and the patient. "Time-out" conducted as per Joint Commission's Universal Protocol (UP.01.01.01). Time: 1346  Description of Procedure:          Area Prepped: Entire knee area, from mid-thigh to mid-shin, lateral, anterior, and medial aspects. Prepping solution: DuraPrep (Iodine Povacrylex [0.7% available iodine] and Isopropyl Alcohol, 74% w/w) Safety Precautions: Aspiration looking for blood return was conducted prior to all injections. At no point did we inject any substances, as a needle was being advanced. No attempts were made at seeking any paresthesias. Safe injection practices and needle disposal techniques used. Medications properly checked for expiration dates. SDV (single dose vial) medications used. Description of the Procedure: Protocol guidelines were followed. The  patient was placed in position over the procedure table. The target area was identified and the area prepped in the usual manner. Skin & deeper tissues infiltrated with local anesthetic. Appropriate amount of time allowed to pass for local anesthetics to take effect. The procedure needles were then advanced to the  target area. Proper needle placement secured. Negative aspiration confirmed. Solution injected in intermittent fashion, asking for systemic symptoms every 0.5cc of injectate. The needles were then removed and the area cleansed, making sure to leave some of the prepping solution back to take advantage of its long term bactericidal properties.  Vitals:   03/09/19 1320 03/09/19 1322 03/09/19 1340 03/09/19 1350  BP:  129/60 (!) 165/75 137/63  Pulse:  76 78 71  Resp:  16 18 16   Temp:  98.2 F (36.8 C)    SpO2:  99% 100% 99%  Weight: 137 lb (62.1 kg)     Height: 4\' 11"  (1.499 m)       Start Time: 1346 hrs. End Time: 1353 hrs. Materials:  Needle(s) Type: Spinal Needle Gauge: 22G Length: 3.5-in Medication(s): Please see orders for medications and dosing details.  5 cc solution made of 4 cc of 0.2% ropivacaine, 1 cc of Decadron 10 mg/cc.  1.5 cc injected at each level above for the right knee.  Total steroid dose equals 10 mg of Decadron. Imaging Guidance (Non-Spinal):          Type of Imaging Technique: Fluoroscopy Guidance (Non-Spinal) Indication(s): Assistance in needle guidance and placement for procedures requiring needle placement in or near specific anatomical locations not easily accessible without such assistance. Exposure Time: Please see nurses notes. Contrast: None used. Fluoroscopic Guidance: I was personally present during the use of fluoroscopy. "Tunnel Vision Technique" used to obtain the best possible view of the target area. Parallax error corrected before commencing the procedure. "Direction-depth-direction" technique used to introduce the needle under continuous  pulsed fluoroscopy. Once target was reached, antero-posterior, oblique, and lateral fluoroscopic projection used confirm needle placement in all planes. Images permanently stored in EMR. Interpretation: No contrast injected. I personally interpreted the imaging intraoperatively. Adequate needle placement confirmed in multiple planes. Permanent images saved into the patient's record.  Antibiotic Prophylaxis:   Anti-infectives (From admission, onward)   None     Indication(s): None identified  Post-operative Assessment:  Post-procedure Vital Signs:  Pulse/HCG Rate: 71  Temp: 98.2 F (36.8 C) Resp: 16 BP: 137/63 SpO2: 99 %  EBL: None  Complications: No immediate post-treatment complications observed by team, or reported by patient.  Note: The patient tolerated the entire procedure well. A repeat set of vitals were taken after the procedure and the patient was kept under observation following institutional policy, for this type of procedure. Post-procedural neurological assessment was performed, showing return to baseline, prior to discharge. The patient was provided with post-procedure discharge instructions, including a section on how to identify potential problems. Should any problems arise concerning this procedure, the patient was given instructions to immediately contact us, at any time, without hesitation. In any case, we plan to contact the patient by telephone for a follow-up status report regarding this interventional procedure.  Comments:  No additional relevant information.  Plan of Care  Orders:  Orders Placed This Encounter  Procedures   SHOULDER INJECTION    Standing Status:   Future    Standing Expiration Date:   04/08/2019    Scheduling Instructions:     Side: RIGHT shoulder injection     Sedation: w/o sedation     Timeframe: As soon as schedule allows    Order Specific Question:   Where will this procedure be performed?    Answer:   [facility]     Comments:   Sharmayne Jablon   [facility] C-ARM 1-60 MIN NO REPORT  Intraoperative interpretation by procedural physician at [facility].    Standing Status:   Standing    Number of Occurrences:   1    Order Specific Question:   Reason for exam:    Answer:   Assistance in needle guidance and placement for procedures requiring needle placement in or near specific anatomical locations not easily accessible without such assistance.   Right shoulder pain with limited shoulder range of motion.  History of right shoulder osteoarthritis.  Discussed intra-articular right shoulder steroid injection.  Risks and benefits reviewed patient like to proceed.  Medications ordered for procedure: Meds ordered this encounter  Medications   lidocaine (XYLOCAINE) 2 % (with pres) injection 400 mg   ropivacaine (PF) 2 mg/mL (0.2%) (NAROPIN) injection 1 mL   dexamethasone (DECADRON) injection 10 mg   Medications administered: We administered lidocaine, ropivacaine (PF) 2 mg/mL (0.2%), and dexamethasone.  See the medical record for exact dosing, route, and time of administration.  Follow-up plan:   Return in about 2 weeks (around 03/23/2019) for RIGHT shoulder injection without sedation.      Status post right knee genicular nerve block 1 on 03/09/2019.  Will return for right shoulder steroid injection.   Recent Visits Date Type Provider Dept  03/03/19 Office Visit Gillis Santa, MD [facility]  Showing recent visits within past 90 days and meeting all other requirements   Today's Visits Date Type Provider Dept  03/09/19 Procedure visit Gillis Santa, MD [facility]  Showing today's visits and meeting all other requirements   Future Appointments Date Type Provider Dept  03/23/19 Appointment Gillis Santa, MD [facility]  Showing future appointments within next 90 days and meeting all other requirements   Disposition: Discharge home  Discharge Date & Time:  03/09/2019; 1410 hrs.   Primary Care Physician: Cletis Athens, MD Location: [facility] Note by: Gillis Santa, MD Date: 03/09/2019; Time: 2:15 PM  Disclaimer:  Medicine is not an exact science. The only guarantee in medicine is that nothing is guaranteed. It is important to note that the decision to proceed with this intervention was based on the information collected from the patient. The Data and conclusions were drawn from the patient's questionnaire, the interview, and the physical examination. Because the information was provided in large part by the patient, it cannot be guaranteed that it has not been purposely or unconsciously manipulated. Every effort has been made to obtain as much relevant data as possible for this evaluation. It is important to note that the conclusions that lead to this procedure are derived in large part from the available data. Always take into account that the treatment will also be dependent on availability of resources and existing treatment guidelines, considered by other Pain Management Practitioners as being common knowledge and practice, at the time of the intervention. For Medico-Legal purposes, it is also important to point out that variation in procedural techniques and pharmacological choices are the acceptable norm. The indications, contraindications, technique, and results of the above procedure should only be interpreted and judged by a Board-Certified Interventional Pain Specialist with extensive familiarity and expertise in the same exact procedure and technique.

## 2019-03-09 NOTE — Patient Instructions (Signed)
____________________________________________________________________________________________  Post-Procedure Discharge Instructions  Instructions:  Apply ice:   Purpose: This will minimize any swelling and discomfort after procedure.   When: Day of procedure, as soon as you get home.  How: Fill a plastic sandwich bag with crushed ice. Cover it with a small towel and apply to injection site.  How long: (15 min on, 15 min off) Apply for 15 minutes then remove x 15 minutes.  Repeat sequence on day of procedure, until you go to bed.  Apply heat:   Purpose: To treat any soreness and discomfort from the procedure.  When: Starting the next day after the procedure.  How: Apply heat to procedure site starting the day following the procedure.  How long: May continue to repeat daily, until discomfort goes away.  Food intake: Start with clear liquids (like water) and advance to regular food, as tolerated.   Physical activities: Keep activities to a minimum for the first 8 hours after the procedure. After that, then as tolerated.  Driving: If you have received any sedation, be responsible and do not drive. You are not allowed to drive for 24 hours after having sedation.  Blood thinner: (Applies only to those taking blood thinners) You may restart your blood thinner 6 hours after your procedure.  Insulin: (Applies only to Diabetic patients taking insulin) As soon as you can eat, you may resume your normal dosing schedule.  Infection prevention: Keep procedure site clean and dry. Shower daily and clean area with soap and water.  Post-procedure Pain Diary: Extremely important that this be done correctly and accurately. Recorded information will be used to determine the next step in treatment. For the purpose of accuracy, follow these rules:  Evaluate only the area treated. Do not report or include pain from an untreated area. For the purpose of this evaluation, ignore all other areas of pain,  except for the treated area.  After your procedure, avoid taking a long nap and attempting to complete the pain diary after you wake up. Instead, set your alarm clock to go off every hour, on the hour, for the initial 8 hours after the procedure. Document the duration of the numbing medicine, and the relief you are getting from it.  Do not go to sleep and attempt to complete it later. It will not be accurate. If you received sedation, it is likely that you were given a medication that may cause amnesia. Because of this, completing the diary at a later time may cause the information to be inaccurate. This information is needed to plan your care.  Follow-up appointment: Keep your post-procedure follow-up evaluation appointment after the procedure (usually 2 weeks for most procedures, 6 weeks for radiofrequencies). DO NOT FORGET to bring you pain diary with you.   Expect: (What should I expect to see with my procedure?)  From numbing medicine (AKA: Local Anesthetics): Numbness or decrease in pain. You may also experience some weakness, which if present, could last for the duration of the local anesthetic.  Onset: Full effect within 15 minutes of injected.  Duration: It will depend on the type of local anesthetic used. On the average, 1 to 8 hours.   From steroids (Applies only if steroids were used): Decrease in swelling or inflammation. Once inflammation is improved, relief of the pain will follow.  Onset of benefits: Depends on the amount of swelling present. The more swelling, the longer it will take for the benefits to be seen. In some cases, up to 10 days.    Duration: Steroids will stay in the system x 2 weeks. Duration of benefits will depend on multiple posibilities including persistent irritating factors.  Side-effects: If present, they may typically last 2 weeks (the duration of the steroids).  Frequent: Cramps (if they occur, drink Gatorade and take over-the-counter Magnesium 450-500 mg  once to twice a day); water retention with temporary weight gain; increases in blood sugar; decreased immune system response; increased appetite.  Occasional: Facial flushing (red, warm cheeks); mood swings; menstrual changes.  Uncommon: Long-term decrease or suppression of natural hormones; bone thinning. (These are more common with higher doses or more frequent use. This is why we prefer that our patients avoid having any injection therapies in other practices.)   Very Rare: Severe mood changes; psychosis; aseptic necrosis.  From procedure: Some discomfort is to be expected once the numbing medicine wears off. This should be minimal if ice and heat are applied as instructed.  Call if: (When should I call?)  You experience numbness and weakness that gets worse with time, as opposed to wearing off.  New onset bowel or bladder incontinence. (Applies only to procedures done in the spine)  Emergency Numbers:  Durning business hours (Monday - Thursday, 8:00 AM - 4:00 PM) (Friday, 9:00 AM - 12:00 Noon): [phone]  After hours: [phone]  NOTE: If you are having a problem and are unable connect with, or to talk to a provider, then go to your nearest urgent care or emergency department. If the problem is serious and urgent, please call 911. ____________________________________________________________________________________________   Knee Injection A knee injection is a procedure to get medicine into your knee joint to relieve the pain, swelling, and stiffness of arthritis. Your health care provider uses a needle to inject medicine, which may also help to lubricate and cushion your knee joint. You may need more than one injection. Tell a health care provider about:  Any allergies you have.  All medicines you are taking, including vitamins, herbs, eye drops, creams, and over-the-counter medicines.  Any problems you or family members have had with anesthetic medicines.  Any blood  disorders you have.  Any surgeries you have had.  Any medical conditions you have.  Whether you are pregnant or may be pregnant. What are the risks? Generally, this is a safe procedure. However, problems may occur, including:  Infection.  Bleeding.  Symptoms that get worse.  Damage to the area around your knee.  Allergic reaction to any of the medicines.  Skin reactions from repeated injections. What happens before the procedure?  Ask your health care provider about changing or stopping your regular medicines. This is especially important if you are taking diabetes medicines or blood thinners.  Plan to have someone take you home from the hospital or clinic. What happens during the procedure?   You will sit or lie down in a position for your knee to be treated.  The skin over your kneecap will be cleaned with a germ-killing soap.  You will be given a medicine that numbs the area (local anesthetic). You may feel some stinging.  The medicine will be injected into your knee. The needle is carefully placed between your kneecap and your knee. The medicine is injected into the joint space.  The needle will be removed at the end of the procedure.  A bandage (dressing) may be placed over the injection site. The procedure may vary among health care providers and hospitals. What can I expect after the procedure?  Your blood pressure,   heart rate, breathing rate, and blood oxygen level will be monitored until you leave the hospital or clinic.  You may have to move your knee through its full range of motion. This helps to get all the medicine into your joint space.  You will be watched to make sure that you do not have a reaction to the injected medicine.  You may feel more pain, swelling, and warmth than you did before the injection. This reaction may last about 1-2 days. Follow these instructions at home: Medicines  Take over-the-counter and prescription medicines only as  told by your doctor.  Do not drive or use heavy machinery while taking prescription pain medicine.  Do not take medicines such as aspirin and ibuprofen unless your health care provider tells you to take them. Injection site care  Follow instructions from your health care provider about: ? How to take care of your puncture site. ? When and how you should change your dressing. ? When you should remove your dressing.  Check your injection area every day for signs of infection. Check for: ? More redness, swelling, or pain after 2 days. ? Fluid or blood. ? Pus or a bad smell. ? Warmth. Managing pain, stiffness, and swelling   If directed, put ice on the injection area: ? Put ice in a plastic bag. ? Place a towel between your skin and the bag. ? Leave the ice on for 20 minutes, 2-3 times per day.  Do not apply heat to your knee.  Raise (elevate) the injection area above the level of your heart while you are sitting or lying down. General instructions  If you were given a dressing, keep it dry until your health care provider says it can be removed. Ask your health care provider when you can start showering or taking a bath.  Avoid strenuous activities for as long as directed by your health care provider. Ask your health care provider when you can return to your normal activities.  Keep all follow-up visits as told by your health care provider. This is important. You may need more injections. Contact a health care provider if you have:  A fever.  Warmth in your injection area.  Fluid, blood, or pus coming from your injection site.  Symptoms at your injection site that last longer than 2 days after your procedure. Get help right away if:  Your knee: ? Turns very red. ? Becomes very swollen. ? Is in severe pain. Summary  A knee injection is a procedure to get medicine into your knee joint to relieve the pain, swelling, and stiffness of arthritis.  A needle is carefully  placed between your kneecap and your knee to inject medicine into the joint space.  Before the procedure, ask your health care provider about changing or stopping your regular medicines, especially if you are taking diabetes medicines or blood thinners.  Contact your health care provider if you have any problems or questions after your procedure. This information is not intended to replace advice given to you by your health care provider. Make sure you discuss any questions you have with your health care provider. Document Released: 09/30/2006 Document Revised: 07/29/2017 Document Reviewed: 07/29/2017 Elsevier Patient Education  2020 [employer]  

## 2019-03-09 NOTE — Progress Notes (Signed)
Safety precautions to be maintained throughout the outpatient stay will include: orient to surroundings, keep bed in low position, maintain call bell within reach at all times, provide assistance with transfer out of bed and ambulation.  

## 2019-03-10 ENCOUNTER — Telehealth: Payer: Self-pay | Admitting: *Deleted

## 2019-03-10 NOTE — Telephone Encounter (Signed)
No problems post procedure. 

## 2019-03-12 ENCOUNTER — Ambulatory Visit
Admission: RE | Admit: 2019-03-12 | Discharge: 2019-03-12 | Disposition: A | Discharge status: Home / Self Care | Payer: Medicare Other | Source: Ambulatory Visit | Attending: Internal Medicine | Admitting: Internal Medicine

## 2019-03-12 ENCOUNTER — Other Ambulatory Visit: Payer: Self-pay

## 2019-03-12 DIAGNOSIS — Z1231 Encounter for screening mammogram for malignant neoplasm of breast: Secondary | ICD-10-CM | POA: Diagnosis present

## 2019-03-23 ENCOUNTER — Ambulatory Visit
Admission: RE | Admit: 2019-03-23 | Discharge: 2019-03-23 | Disposition: A | Discharge status: Home / Self Care | Payer: Medicare Other | Source: Ambulatory Visit | Attending: Student in an Organized Health Care Education/Training Program | Admitting: Student in an Organized Health Care Education/Training Program

## 2019-03-23 ENCOUNTER — Other Ambulatory Visit: Payer: Self-pay

## 2019-03-23 ENCOUNTER — Ambulatory Visit (HOSPITAL_BASED_OUTPATIENT_CLINIC_OR_DEPARTMENT_OTHER): Payer: Medicare Other | Admitting: Student in an Organized Health Care Education/Training Program

## 2019-03-23 ENCOUNTER — Encounter: Payer: Self-pay | Admitting: Student in an Organized Health Care Education/Training Program

## 2019-03-23 VITALS — BP 169/80 | HR 79 | Temp 98.3°F | Resp 18 | Ht 60.0 in | Wt 136.0 lb

## 2019-03-23 DIAGNOSIS — M19011 Primary osteoarthritis, right shoulder: Secondary | ICD-10-CM

## 2019-03-23 DIAGNOSIS — M19111 Post-traumatic osteoarthritis, right shoulder: Secondary | ICD-10-CM

## 2019-03-23 DIAGNOSIS — M1711 Unilateral primary osteoarthritis, right knee: Secondary | ICD-10-CM | POA: Diagnosis present

## 2019-03-23 DIAGNOSIS — S46001S Unspecified injury of muscle(s) and tendon(s) of the rotator cuff of right shoulder, sequela: Secondary | ICD-10-CM | POA: Insufficient documentation

## 2019-03-23 MED ORDER — IOHEXOL 180 MG/ML  SOLN
10.0000 mL | Freq: Once | INTRAMUSCULAR | Status: AC
  Administered 2019-03-23: 14:00:00 10 mL via EPIDURAL

## 2019-03-23 MED ORDER — ROPIVACAINE HCL 2 MG/ML IJ SOLN
1.0000 mL | Freq: Once | INTRAMUSCULAR | Status: AC
  Administered 2019-03-23: 10 mL via EPIDURAL
  Filled 2019-03-23: qty 10

## 2019-03-23 MED ORDER — DEXAMETHASONE SODIUM PHOSPHATE 10 MG/ML IJ SOLN
10.0000 mg | Freq: Once | INTRAMUSCULAR | Status: AC
  Administered 2019-03-23: 10 mg
  Filled 2019-03-23: qty 1

## 2019-03-23 MED ORDER — LIDOCAINE HCL 2 % IJ SOLN
20.0000 mL | Freq: Once | INTRAMUSCULAR | Status: AC
  Administered 2019-03-23: 14:00:00 400 mg
  Filled 2019-03-23: qty 20

## 2019-03-23 NOTE — Patient Instructions (Addendum)
1.  We will plan for right genicular nerve radiofrequency ablation to help out with your right knee pain since you had a positive diagnostic nerve block performed on the right knee on 03/09/2019.  2.  Okay to take Tylenol 650 mg 2-3 times a day as needed for pain  3.  Recommend physical therapy/Occupational Therapy once to twice a week for the next 4 to 6 weeks at her assisted living facility.   __________________________________________________________________________________________  Preparing for Procedure with Sedation  Procedure appointments are limited to planned procedures:  No Prescription Refills.  No disability issues will be discussed.  No medication changes will be discussed.  Instructions:  Oral Intake: Do not eat or drink anything for at least 8 hours prior to your procedure.  Transportation: Public transportation is not allowed. Bring an adult driver. The driver must be physically present in our waiting room before any procedure can be started.  Physical Assistance: Bring an adult physically capable of assisting you, in the event you need help. This adult should keep you company at home for at least 6 hours after the procedure.  Blood Pressure Medicine: Take your blood pressure medicine with a sip of water the morning of the procedure.  Blood thinners: Notify our staff if you are taking any blood thinners. Depending on which one you take, there will be specific instructions on how and when to stop it.  Diabetics on insulin: Notify the staff so that you can be scheduled 1st case in the morning. If your diabetes requires high dose insulin, take only  of your normal insulin dose the morning of the procedure and notify the staff that you have done so.  Preventing infections: Shower with an antibacterial soap the morning of your procedure.  Build-up your immune system: Take 1000 mg of Vitamin C with every meal (3 times a day) the day prior to your  procedure.  Antibiotics: Inform the staff if you have a condition or reason that requires you to take antibiotics before dental procedures.  Pregnancy: If you are pregnant, call and cancel the procedure.  Sickness: If you have a cold, fever, or any active infections, call and cancel the procedure.  Arrival: You must be in the facility at least 30 minutes prior to your scheduled procedure.  Children: Do not bring children with you.  Dress appropriately: Bring dark clothing that you would not mind if they get stained.  Valuables: Do not bring any jewelry or valuables.  Reasons to call and reschedule or cancel your procedure: (Following these recommendations will minimize the risk of a serious complication.)  Surgeries: Avoid having procedures within 2 weeks of any surgery. (Avoid for 2 weeks before or after any surgery).  Flu Shots: Avoid having procedures within 2 weeks of a flu shots or . (Avoid for 2 weeks before or after immunizations).  Barium: Avoid having a procedure within 7-10 days after having had a radiological study involving the use of radiological contrast. (Myelograms, Barium swallow or enema study).  Heart attacks: Avoid any elective procedures or surgeries for the initial 6 months after a "Myocardial Infarction" (Heart Attack).  Blood thinners: It is imperative that you stop these medications before procedures. Let us know if you if you take any blood thinner.   Infection: Avoid procedures during or within two weeks of an infection (including chest colds or gastrointestinal problems). Symptoms associated with infections include: Localized redness, fever, chills, night sweats or profuse sweating, burning sensation when voiding, cough, congestion, stuffiness, runny nose,  sore throat, diarrhea, nausea, vomiting, cold or Flu symptoms, recent or current infections. It is specially important if the infection is over the area that we intend to treat.  Heart and lung problems:  Symptoms that may suggest an active cardiopulmonary problem include: cough, chest pain, breathing difficulties or shortness of breath, dizziness, ankle swelling, uncontrolled high or unusually low blood pressure, and/or palpitations. If you are experiencing any of these symptoms, cancel your procedure and contact your primary care physician for an evaluation.  Remember:  Regular Business hours are:  Monday to Thursday 8:00 AM to 4:00 PM  Provider's Schedule: Milinda Pointer, MD:  Procedure days: Tuesday and Thursday 7:30 AM to 4:00 PM  Gillis Santa, MD:  Procedure days: Monday and Wednesday 7:30 AM to 4:00 PM ____________________________________________________________________________________________   Radiofrequency Lesioning Radiofrequency lesioning is a procedure that is performed to relieve pain. The procedure is often used for back, neck, or arm pain. Radiofrequency lesioning involves the use of a machine that creates radio waves to make heat. During the procedure, the heat is applied to the nerve that carries the pain signal. The heat damages the nerve and interferes with the pain signal. Pain relief usually starts about 2 weeks after the procedure and lasts for 6 months to 1 year. You will be awake during the procedure. You will need to be able to talk with the health care provider during the procedure. Tell a health care provider about:  Any allergies you have.  All medicines you are taking, including vitamins, herbs, eye drops, creams, and over-the-counter medicines.  Any problems you or family members have had with anesthetic medicines.  Any blood disorders you have.  Any surgeries you have had.  Any medical conditions you have or have had.  Whether you are pregnant or may be pregnant. What are the risks? Generally, this is a safe procedure. However, problems may occur, including:  Pain or soreness at the injection site.  Allergic reaction to medicines given during  the procedure.  Bleeding.  Infection at the injection site.  Damage to nerves or blood vessels. What happens before the procedure? Staying hydrated Follow instructions from your health care provider about hydration, which may include:  Up to 2 hours before the procedure - you may continue to drink clear liquids, such as water, clear fruit juice, black coffee, and plain tea. Eating and drinking Follow instructions from your health care provider about eating and drinking, which may include:  8 hours before the procedure - stop eating heavy meals or foods, such as meat, fried foods, or fatty foods.  6 hours before the procedure - stop eating light meals or foods, such as toast or cereal.  6 hours before the procedure - stop drinking milk or drinks that contain milk.  2 hours before the procedure - stop drinking clear liquids. Medicines Ask your health care provider about:  Changing or stopping your regular medicines. This is especially important if you are taking diabetes medicines or blood thinners.  Taking medicines such as aspirin and ibuprofen. These medicines can thin your blood. Do not take these medicines unless your health care provider tells you to take them.  Taking over-the-counter medicines, vitamins, herbs, and supplements. General instructions  Plan to have someone take you home from the hospital or clinic.  If you will be going home right after the procedure, plan to have someone with you for 24 hours.  Ask your health care provider what steps will be taken to help prevent infection.  These may include: ? Removing hair at the procedure site. ? Washing skin with a germ-killing soap. ? Taking antibiotic medicine. What happens during the procedure?   An IV will be inserted into one of your veins.  You will be given one or more of the following: ? A medicine to help you relax (sedative). ? A medicine to numb the area (local anesthetic).  Your health care  provider will insert a radiofrequency needle into the area to be treated. This is done with the help of a type of X-ray (fluoroscopy).  A wire that carries the radio waves (electrode) will be put through the radiofrequency needle.  An electrical pulse will be sent through the electrode to verify the correct nerve that is causing your pain. You will feel a tingling sensation, and you may have muscle twitching.  The tissue around the needle tip will be heated by an electric current that comes from the radiofrequency machine. This will numb the nerves.  The needle will be removed.  A bandage (dressing) will be put on the insertion area. The procedure may vary among health care providers and hospitals. What happens after the procedure?  Your blood pressure, heart rate, breathing rate, and blood oxygen level will be monitored until you leave the hospital or clinic.  Return to your normal activities as told by your health care provider. Ask your health care provider what activities are safe for you.  Do not drive for 24 hours if you were given a sedative during your procedure. Summary  Radiofrequency lesioning is a procedure that is performed to relieve pain. The procedure is often used for back, neck, or arm pain.  Radiofrequency lesioning involves the use of a machine that creates radio waves to make heat.  Plan to have someone take you home from the hospital or clinic. Do not drive for 24 hours if you were given a sedative during your procedure.  Return to your normal activities as told by your health care provider. Ask your health care provider what activities are safe for you. This information is not intended to replace advice given to you by your health care provider. Make sure you discuss any questions you have with your health care provider. Document Released: 03/07/2011 Document Revised: 03/27/2018 Document Reviewed: 03/27/2018 Elsevier Patient Education  2020 [employer].

## 2019-03-23 NOTE — Progress Notes (Signed)
Patient's Name: Sue Knapp  MRN: [id number]  Referring Provider: Cletis Athens, MD  DOB: [birth year]  PCP: Cletis Athens, MD  DOS: 03/23/2019  Note by: Gillis Santa, MD  Service setting: Ambulatory outpatient  Specialty: Interventional Pain Management  Patient type: Established  Location: [facility] (AMB) Pain Management Facility  Visit type: Interventional Procedure   Primary Reason for Visit: Interventional Pain Management Treatment. CC: Shoulder Pain  Procedure:          Anesthesia, Analgesia, Anxiolysis:  Type: Diagnostic Glenohumeral Joint (shoulder) Injection #1  Primary Purpose: Diagnostic Region: Posterolateral Shoulder Area Level:  Shoulder Target Area: Glenohumeral Joint (shoulder) Approach: Posterior approach. Laterality: Right-Sided  Type: Local Anesthesia  Local Anesthetic: Lidocaine 1-2%  Position: Supine   Indications: 1. Osteoarthritis of right shoulder due to rotator cuff injury   2. Osteoarthritis of right shoulder, unspecified osteoarthritis type   3. Primary osteoarthritis of right knee    Pain Score: Pre-procedure: 6 /10 Post-procedure: 0-No pain/10   Pre-op Assessment:  Sue Knapp is a 85 y.o. (year old), female patient, seen today for interventional treatment. She  has a past surgical history that includes Thyroidectomy, partial; Abdominal hysterectomy; Breast surgery; Cataract extraction w/PHACO (Right, 03/01/2015); Cataract extraction w/PHACO (Left, 03/22/2015); Mastectomy partial / lumpectomy; Breast lumpectomy (Left, 02/21/2012); Breast biopsy (Left, 2005); and Breast biopsy (Left, 2013). Sue Knapp has a current medication list which includes the following prescription(s): acetaminophen, alendronate, alprazolam, aspirin ec, calcium-vitamin d, cholecalciferol, cyanocobalamin, diclofenac sodium, ferrous sulfate, gabapentin, hydrochlorothiazide, ibuprofen, latanoprost, letrozole, losartan, metoprolol succinate, multivitamin with minerals, and vitamin c. Her  primarily concern today is the Shoulder Pain  Initial Vital Signs:  Pulse/HCG Rate: 79ECG Heart Rate: 80 Temp: 98.3 F (36.8 C) Resp: 17 BP: 139/65 SpO2: 98 %  BMI: Estimated body mass index is 26.56 kg/m as calculated from the following:   Height as of this encounter: 5' (1.524 m).   Weight as of this encounter: 136 lb (61.7 kg).  Risk Assessment: Allergies: Reviewed. She is allergic to codeine; etodolac; and zolpidem.  Allergy Precautions: None required Coagulopathies: Reviewed. None identified.  Blood-thinner therapy: None at this time Active Infection(s): Reviewed. None identified. Sue Knapp is afebrile  Site Confirmation: Sue Knapp was asked to confirm the procedure and laterality before marking the site Procedure checklist: Completed Consent: Before the procedure and under the influence of no sedative(s), amnesic(s), or anxiolytics, the patient was informed of the treatment options, risks and possible complications. To fulfill our ethical and legal obligations, as recommended by the American Medical Association's Code of Ethics, I have informed the patient of my clinical impression; the nature and purpose of the treatment or procedure; the risks, benefits, and possible complications of the intervention; the alternatives, including doing nothing; the risk(s) and benefit(s) of the alternative treatment(s) or procedure(s); and the risk(s) and benefit(s) of doing nothing. The patient was provided information about the general risks and possible complications associated with the procedure. These may include, but are not limited to: failure to achieve desired goals, infection, bleeding, organ or nerve damage, allergic reactions, paralysis, and death. In addition, the patient was informed of those risks and complications associated to the procedure, such as failure to decrease pain; infection; bleeding; organ or nerve damage with subsequent damage to sensory, motor, and/or autonomic  systems, resulting in permanent pain, numbness, and/or weakness of one or several areas of the body; allergic reactions; (i.e.: anaphylactic reaction); and/or death. Furthermore, the patient was informed of those risks and complications associated with the medications. These  include, but are not limited to: allergic reactions (i.e.: anaphylactic or anaphylactoid reaction(s)); adrenal axis suppression; blood sugar elevation that in diabetics may result in ketoacidosis or comma; water retention that in patients with history of congestive heart failure may result in shortness of breath, pulmonary edema, and decompensation with resultant heart failure; weight gain; swelling or edema; medication-induced neural toxicity; particulate matter embolism and blood vessel occlusion with resultant organ, and/or nervous system infarction; and/or aseptic necrosis of one or more joints. Finally, the patient was informed that Medicine is not an exact science; therefore, there is also the possibility of unforeseen or unpredictable risks and/or possible complications that may result in a catastrophic outcome. The patient indicated having understood very clearly. We have given the patient no guarantees and we have made no promises. Enough time was given to the patient to ask questions, all of which were answered to the patient's satisfaction. Sue Knapp has indicated that she wanted to continue with the procedure. Attestation: I, the ordering provider, attest that I have discussed with the patient the benefits, risks, side-effects, alternatives, likelihood of achieving goals, and potential problems during recovery for the procedure that I have provided informed consent. Date   Time: 03/23/2019 12:55 PM  Pre-Procedure Preparation:  Monitoring: As per clinic protocol. Respiration, ETCO2, SpO2, BP, heart rate and rhythm monitor placed and checked for adequate function Safety Precautions: Patient was assessed for positional comfort  and pressure points before starting the procedure. Time-out: I initiated and conducted the "Time-out" before starting the procedure, as per protocol. The patient was asked to participate by confirming the accuracy of the "Time Out" information. Verification of the correct person, site, and procedure were performed and confirmed by me, the nursing staff, and the patient. "Time-out" conducted as per Joint Commission's Universal Protocol (UP.01.01.01). Time: 1342  Description of Procedure:          Area Prepped: Entire shoulder Area: Right Prepping solution: DuraPrep (Iodine Povacrylex [0.7% available iodine] and Isopropyl Alcohol, 74% w/w) Safety Precautions: Aspiration looking for blood return was conducted prior to all injections. At no point did we inject any substances, as a needle was being advanced. No attempts were made at seeking any paresthesias. Safe injection practices and needle disposal techniques used. Medications properly checked for expiration dates. SDV (single dose vial) medications used. Description of the Procedure: Protocol guidelines were followed. The patient was placed in position over the procedure table. The target area was identified and the area prepped in the usual manner. Skin & deeper tissues infiltrated with local anesthetic. Appropriate amount of time allowed to pass for local anesthetics to take effect. The procedure needles were then advanced to the target area. Proper needle placement secured. Negative aspiration confirmed. Solution injected in intermittent fashion, asking for systemic symptoms every 0.5cc of injectate. The needles were then removed and the area cleansed, making sure to leave some of the prepping solution back to take advantage of its long term bactericidal properties.    Vitals:   03/23/19 1339 03/23/19 1345 03/23/19 1350 03/23/19 1355  BP: (!) 194/85 (!) 192/84 (!) 179/82 (!) 169/80  Pulse:      Resp: 17 (!) 25 17 18   Temp:      SpO2: 98% 98% 98%  98%  Weight:      Height:        Start Time: 1342 hrs. End Time: 1350 hrs. Materials:  Needle(s) Type: Spinal Needle Gauge: 25G Length: 3.5-in Medication(s): Please see orders for medications and dosing details. 3.5  cc solution made of 2.5 cc of 0.2% ropivacaine, 1 cc of Decadron 10 mg/cc. Imaging Guidance (Non-Spinal):          Type of Imaging Technique: Fluoroscopy Guidance (Non-Spinal) Indication(s): Assistance in needle guidance and placement for procedures requiring needle placement in or near specific anatomical locations not easily accessible without such assistance. Exposure Time: Please see nurses notes. Contrast: Before injecting any contrast, we confirmed that the patient did not have an allergy to iodine, shellfish, or radiological contrast. Once satisfactory needle placement was completed at the desired level, radiological contrast was injected. Contrast injected under live fluoroscopy. No contrast complications. See chart for type and volume of contrast used. Fluoroscopic Guidance: I was personally present during the use of fluoroscopy. "Tunnel Vision Technique" used to obtain the best possible view of the target area. Parallax error corrected before commencing the procedure. "Direction-depth-direction" technique used to introduce the needle under continuous pulsed fluoroscopy. Once target was reached, antero-posterior, oblique, and lateral fluoroscopic projection used confirm needle placement in all planes. Images permanently stored in EMR. Interpretation: I personally interpreted the imaging intraoperatively. Adequate needle placement confirmed in multiple planes. Appropriate spread of contrast into desired area was observed. No evidence of afferent or efferent intravascular uptake. Permanent images saved into the patient's record.  Antibiotic Prophylaxis:   Anti-infectives (From admission, onward)   None     Indication(s): None identified  Post-operative Assessment:    Post-procedure Vital Signs:  Pulse/HCG Rate: 7980 Temp: 98.3 F (36.8 C) Resp: 18 BP: (!) 169/80 SpO2: 98 %  EBL: None  Complications: No immediate post-treatment complications observed by team, or reported by patient.  Note: The patient tolerated the entire procedure well. A repeat set of vitals were taken after the procedure and the patient was kept under observation following institutional policy, for this type of procedure. Post-procedural neurological assessment was performed, showing return to baseline, prior to discharge. The patient was provided with post-procedure discharge instructions, including a section on how to identify potential problems. Should any problems arise concerning this procedure, the patient was given instructions to immediately contact us, at any time, without hesitation. In any case, we plan to contact the patient by telephone for a follow-up status report regarding this interventional procedure.  Comments:  No additional relevant information.  Plan of Care  Orders:  Orders Placed This Encounter  Procedures   Radiofrequency,Genicular    Standing Status:   Future    Standing Expiration Date:   09/19/2020    Scheduling Instructions:     Side(s): RIGHT     Level(s): Superior-Lateral, Superior-Medial, and Inferior-Medial Genicular Nerve(s)     Sedation: With Sedation     Scheduling Timeframe: As soon as pre-approved    Order Specific Question:   Where will this procedure be performed?    Answer:   [facility]   DG PAIN [facility] C-ARM 1-60 MIN NO REPORT    Intraoperative interpretation by procedural physician at [place].    Standing Status:   Standing    Number of Occurrences:   1    Order Specific Question:   Reason for exam:    Answer:   Assistance in needle guidance and placement for procedures requiring needle placement in or near specific anatomical locations not easily accessible without such assistance.   Of note patient had  a positive diagnostic right genicular nerve block on 03/09/2019.  She notes greater than 75% pain relief for approximately 2 weeks with gradual return of pain thereafter.  Risks and benefits of genicular nerve RFA reviewed and discussed with patient and patient would like to proceed.  Medications ordered for procedure: Meds ordered this encounter  Medications   iohexol (OMNIPAQUE) 180 MG/ML injection 10 mL    Must be Myelogram-compatible. If not available, you may substitute with a water-soluble, non-ionic, hypoallergenic, myelogram-compatible radiological contrast medium.   lidocaine (XYLOCAINE) 2 % (with pres) injection 400 mg   ropivacaine (PF) 2 mg/mL (0.2%) (NAROPIN) injection 1 mL   dexamethasone (DECADRON) injection 10 mg   Medications administered: We administered iohexol, lidocaine, ropivacaine (PF) 2 mg/mL (0.2%), and dexamethasone.  See the medical record for exact dosing, route, and time of administration.  Follow-up plan:   Return in about 3 weeks (around 04/13/2019) for Procedure R GN RFA with sedation.      Status post right knee genicular nerve block 1 on 03/09/2019.  S/p right shoulder steroid injection 03/23/19. Plan for R Genicular RFA.    Recent Visits Date Type Provider Dept  03/09/19 Procedure visit Gillis Santa, MD [facility]  03/03/19 Office Visit Gillis Santa, MD [facility]  Showing recent visits within past 90 days and meeting all other requirements   Today's Visits Date Type Provider Dept  03/23/19 Procedure visit Gillis Santa, MD [facility]  Showing today's visits and meeting all other requirements   Future Appointments Date Type Provider Dept  04/27/19 Appointment Gillis Santa, MD [facility]  Showing future appointments within next 90 days and meeting all other requirements   Disposition: Discharge home  Discharge Date & Time: 03/23/2019; 1357 hrs.   Primary Care Physician: Cletis Athens, MD Location:  [facility] Note by: Gillis Santa, MD Date: 03/23/2019; Time: 2:58 PM  Disclaimer:  Medicine is not an exact science. The only guarantee in medicine is that nothing is guaranteed. It is important to note that the decision to proceed with this intervention was based on the information collected from the patient. The Data and conclusions were drawn from the patient's questionnaire, the interview, and the physical examination. Because the information was provided in large part by the patient, it cannot be guaranteed that it has not been purposely or unconsciously manipulated. Every effort has been made to obtain as much relevant data as possible for this evaluation. It is important to note that the conclusions that lead to this procedure are derived in large part from the available data. Always take into account that the treatment will also be dependent on availability of resources and existing treatment guidelines, considered by other Pain Management Practitioners as being common knowledge and practice, at the time of the intervention. For Medico-Legal purposes, it is also important to point out that variation in procedural techniques and pharmacological choices are the acceptable norm. The indications, contraindications, technique, and results of the above procedure should only be interpreted and judged by a Board-Certified Interventional Pain Specialist with extensive familiarity and expertise in the same exact procedure and technique.

## 2019-03-24 ENCOUNTER — Telehealth: Payer: Self-pay

## 2019-03-24 NOTE — Telephone Encounter (Signed)
Pt was called and pt reported that the pain is little better today. No problems was reported.

## 2019-03-26 ENCOUNTER — Telehealth: Payer: Self-pay | Admitting: Student in an Organized Health Care Education/Training Program

## 2019-03-26 NOTE — Telephone Encounter (Signed)
Patient called to let us know that she continues to have a good amount of pain and ROM is still very limited.  She states she had hoped for better results and feels very discouraged.  I did explain to her that sometimes after numbing medicine wears off and with the introduction of volume into that space she could feel some increased pain and with the shoulder.  Also explained that the Steroids may just now be beginning to work as the Procedure was done on Monday afternoon and it would take a couple of weeks to make a good assessment.  Patient will continue to use Tylenol and intermittent heat to the area and will f/up with Dr Holley Raring at scheduled appt time.

## 2019-03-26 NOTE — Telephone Encounter (Signed)
Patient is having swelling and pain in shoulder since she had procedure please call to discuss this thank you

## 2019-04-21 ENCOUNTER — Telehealth: Payer: Self-pay

## 2019-04-21 NOTE — Telephone Encounter (Signed)
Attempted to call, message left. Will call again later.

## 2019-04-21 NOTE — Telephone Encounter (Signed)
Sue Knapp (daughter of Sue Knapp) called and is requesting info on upcoming appt for RF and has question's about prior injestions of pt's shoulder. Please call back @ [phone]

## 2019-04-22 NOTE — Telephone Encounter (Signed)
Attempted to call Arbie Cookey, message left.

## 2019-04-23 NOTE — Telephone Encounter (Signed)
Spoke with Arbie Cookey (patient's daughter). She has a few  concerns. 1. The GNB gave Ms. Sekula 15%relief when moving around, and 25% when getting in/out of chair. Her understanding was that at least 50% is required in order to have RFA (scheduled 07-04-19). Should we proceed with the RFA? 2. She believed her mother was getting Hyalgan also. It looks like that has not been done or ordered.  3. She has had no relief after shoulder injection. What to do next? I told her this may need to be discussed at appt. 4. Will you give her anything else for pain? Gabapentin not helping.

## 2019-04-27 ENCOUNTER — Ambulatory Visit: Payer: Medicare Other | Admitting: Student in an Organized Health Care Education/Training Program

## 2019-04-29 ENCOUNTER — Telehealth: Payer: Self-pay | Admitting: *Deleted

## 2019-04-29 NOTE — Telephone Encounter (Signed)
Spoke with patient and clarified information for procedure on Monday.  She is aware that she needs to be NPO, and that she must have a responsible party with her that day. Patient is not on any blood thinners.  She will take her BP medicine that morning.

## 2019-05-04 ENCOUNTER — Ambulatory Visit
Admission: RE | Admit: 2019-05-04 | Discharge: 2019-05-04 | Disposition: A | Discharge status: Home / Self Care | Payer: Medicare Other | Source: Ambulatory Visit | Attending: Student in an Organized Health Care Education/Training Program | Admitting: Student in an Organized Health Care Education/Training Program

## 2019-05-04 ENCOUNTER — Ambulatory Visit (HOSPITAL_BASED_OUTPATIENT_CLINIC_OR_DEPARTMENT_OTHER): Payer: Medicare Other | Admitting: Student in an Organized Health Care Education/Training Program

## 2019-05-04 ENCOUNTER — Other Ambulatory Visit: Payer: Self-pay

## 2019-05-04 ENCOUNTER — Encounter: Payer: Self-pay | Admitting: Student in an Organized Health Care Education/Training Program

## 2019-05-04 VITALS — BP 183/69 | HR 73 | Temp 97.2°F | Resp 18 | Ht 59.0 in | Wt 136.0 lb

## 2019-05-04 DIAGNOSIS — M1711 Unilateral primary osteoarthritis, right knee: Secondary | ICD-10-CM

## 2019-05-04 MED ORDER — DEXAMETHASONE SODIUM PHOSPHATE 10 MG/ML IJ SOLN
10.0000 mg | Freq: Once | INTRAMUSCULAR | Status: AC
  Administered 2019-05-04: 10 mg

## 2019-05-04 MED ORDER — LIDOCAINE HCL 2 % IJ SOLN
INTRAMUSCULAR | Status: AC
  Filled 2019-05-04: qty 20

## 2019-05-04 MED ORDER — LIDOCAINE HCL 2 % IJ SOLN
20.0000 mL | Freq: Once | INTRAMUSCULAR | Status: AC
  Administered 2019-05-04: 400 mg

## 2019-05-04 MED ORDER — ROPIVACAINE HCL 2 MG/ML IJ SOLN
INTRAMUSCULAR | Status: AC
  Filled 2019-05-04: qty 10

## 2019-05-04 MED ORDER — DEXAMETHASONE SODIUM PHOSPHATE 10 MG/ML IJ SOLN
INTRAMUSCULAR | Status: AC
  Filled 2019-05-04: qty 1

## 2019-05-04 MED ORDER — FENTANYL CITRATE (PF) 100 MCG/2ML IJ SOLN
INTRAMUSCULAR | Status: AC
  Filled 2019-05-04: qty 2

## 2019-05-04 MED ORDER — TRAMADOL HCL 50 MG PO TABS: 50.0000 mg | ORAL_TABLET | Freq: Two times a day (BID) | ORAL | 0 refills | Status: DC | PRN

## 2019-05-04 MED ORDER — ROPIVACAINE HCL 2 MG/ML IJ SOLN
1.0000 mL | Freq: Once | INTRAMUSCULAR | Status: AC
  Administered 2019-05-04: 9 mL via EPIDURAL

## 2019-05-04 MED ORDER — FENTANYL CITRATE (PF) 100 MCG/2ML IJ SOLN
25.0000 ug | INTRAMUSCULAR | Status: DC | PRN
  Administered 2019-05-04: 75 ug via INTRAVENOUS

## 2019-05-04 NOTE — Progress Notes (Signed)
Patient's Name: Sue Knapp  MRN: [id number]  Referring Provider: Cletis Athens, MD  DOB: 1923/03/02  PCP: Cletis Athens, MD  DOS: 05/04/2019  Note by: Gillis Santa, MD  Service setting: Ambulatory outpatient  Specialty: Interventional Pain Management  Patient type: Established  Location: [facility] (AMB) Pain Management Facility  Visit type: Interventional Procedure   Primary Reason for Visit: Interventional Pain Management Treatment. CC: Knee Pain (right )  Procedure:          Anesthesia, Analgesia, Anxiolysis:  Type: Therapeutic Superior-lateral, Superior-medial, and Inferior-medial, Genicular Nerve Radiofrequency Ablation.  #1  Region: Lateral, Anterior, and Medial aspects of the knee joint, above and below the knee joint proper. Level: Superior and inferior to the knee joint. Laterality: Right  Type: Moderate (Conscious) Sedation combined with Local Anesthesia Indication(s): Analgesia and Anxiety Route: Intravenous (IV) IV Access: Secured Sedation: Meaningful verbal contact was maintained at all times during the procedure  Local Anesthetic: Lidocaine 1-2%  Position: Supine   Indications: 1. Primary osteoarthritis of right knee    Sue Knapp has been dealing with the above chronic pain for longer than three months and has either failed to respond, was unable to tolerate, or simply did not get enough benefit from other more conservative therapies including, but not limited to: 1. Over-the-counter medications 2. Anti-inflammatory medications 3. Muscle relaxants 4. Membrane stabilizers 5. Opioids 6. Physical therapy and/or chiropractic manipulation 7. Modalities (Heat, ice, etc.) 8. Invasive techniques such as nerve blocks. Sue Knapp has attained more than 50% relief of the pain from a series of diagnostic injections conducted in separate occasions.  Pain Score: Pre-procedure: 9 /10 Post-procedure: 0-No pain/10  Pre-op Assessment:  Sue Knapp is a 85 y.o. (year old),  female patient, seen today for interventional treatment. She  has a past surgical history that includes Thyroidectomy, partial; Abdominal hysterectomy; Breast surgery; Cataract extraction w/PHACO (Right, 03/01/2015); Cataract extraction w/PHACO (Left, 03/22/2015); Mastectomy partial / lumpectomy; Breast lumpectomy (Left, 02/21/2012); Breast biopsy (Left, 2005); and Breast biopsy (Left, 2013). Sue Knapp has a current medication list which includes the following prescription(s): acetaminophen, alendronate, alprazolam, aspirin ec, calcium-vitamin d, cholecalciferol, cyanocobalamin, diclofenac sodium, ferrous sulfate, gabapentin, hydrochlorothiazide, ibuprofen, latanoprost, letrozole, losartan, metoprolol succinate, multivitamin with minerals, vitamin c, and tramadol, and the following Facility-Administered Medications: fentanyl. Her primarily concern today is the Knee Pain (right )  Initial Vital Signs:  Pulse/HCG Rate: 73ECG Heart Rate: 80 Temp: 98 F (36.7 C) Resp: 16 BP: 139/72 SpO2: 97 %  BMI: Estimated body mass index is 27.47 kg/m as calculated from the following:   Height as of this encounter: 4\' 11"  (1.499 m).   Weight as of this encounter: 136 lb (61.7 kg).  Risk Assessment: Allergies: Reviewed. She is allergic to codeine; etodolac; and zolpidem.  Allergy Precautions: None required Coagulopathies: Reviewed. None identified.  Blood-thinner therapy: None at this time Active Infection(s): Reviewed. None identified. Sue Knapp is afebrile  Site Confirmation: Sue Knapp was asked to confirm the procedure and laterality before marking the site Procedure checklist: Completed Consent: Before the procedure and under the influence of no sedative(s), amnesic(s), or anxiolytics, the patient was informed of the treatment options, risks and possible complications. To fulfill our ethical and legal obligations, as recommended by the American Medical Association's Code of Ethics, I have informed the  patient of my clinical impression; the nature and purpose of the treatment or procedure; the risks, benefits, and possible complications of the intervention; the alternatives, including doing nothing; the risk(s) and benefit(s) of the alternative  treatment(s) or procedure(s); and the risk(s) and benefit(s) of doing nothing. The patient was provided information about the general risks and possible complications associated with the procedure. These may include, but are not limited to: failure to achieve desired goals, infection, bleeding, organ or nerve damage, allergic reactions, paralysis, and death. In addition, the patient was informed of those risks and complications associated to the procedure, such as failure to decrease pain; infection; bleeding; organ or nerve damage with subsequent damage to sensory, motor, and/or autonomic systems, resulting in permanent pain, numbness, and/or weakness of one or several areas of the body; allergic reactions; (i.e.: anaphylactic reaction); and/or death. Furthermore, the patient was informed of those risks and complications associated with the medications. These include, but are not limited to: allergic reactions (i.e.: anaphylactic or anaphylactoid reaction(s)); adrenal axis suppression; blood sugar elevation that in diabetics may result in ketoacidosis or comma; water retention that in patients with history of congestive heart failure may result in shortness of breath, pulmonary edema, and decompensation with resultant heart failure; weight gain; swelling or edema; medication-induced neural toxicity; particulate matter embolism and blood vessel occlusion with resultant organ, and/or nervous system infarction; and/or aseptic necrosis of one or more joints. Finally, the patient was informed that Medicine is not an exact science; therefore, there is also the possibility of unforeseen or unpredictable risks and/or possible complications that may result in a catastrophic  outcome. The patient indicated having understood very clearly. We have given the patient no guarantees and we have made no promises. Enough time was given to the patient to ask questions, all of which were answered to the patient's satisfaction. Sue Knapp has indicated that she wanted to continue with the procedure. Attestation: I, the ordering provider, attest that I have discussed with the patient the benefits, risks, side-effects, alternatives, likelihood of achieving goals, and potential problems during recovery for the procedure that I have provided informed consent. Date  Time: 05/04/2019  9:39 AM  Pre-Procedure Preparation:  Monitoring: As per clinic protocol. Respiration, ETCO2, SpO2, BP, heart rate and rhythm monitor placed and checked for adequate function Safety Precautions: Patient was assessed for positional comfort and pressure points before starting the procedure. Time-out: I initiated and conducted the "Time-out" before starting the procedure, as per protocol. The patient was asked to participate by confirming the accuracy of the "Time Out" information. Verification of the correct person, site, and procedure were performed and confirmed by me, the nursing staff, and the patient. "Time-out" conducted as per Joint Commission's Universal Protocol (UP.01.01.01). Time: 1033  Description of Procedure:          Target Area: For Genicular Nerve block(s), the targets are: the superior-lateral genicular nerve, located in the lateral distal portion of the femoral shaft as it curves to form the lateral epicondyle, in the region of the distal femoral metaphysis; the superior-medial genicular nerve, located in the medial distal portion of the femoral shaft as it curves to form the medial epicondyle; and the inferior-medial genicular nerve, located in the medial, proximal portion of the tibial shaft, as it curves to form the medial epicondyle, in the region of the proximal tibial  metaphysis. Approach: Anterior, ipsilateral approach. Area Prepped: Entire knee area, from mid-thigh to mid-shin, lateral, anterior, and medial aspects. Prepping solution: DuraPrep (Iodine Povacrylex [0.7% available iodine] and Isopropyl Alcohol, 74% w/w) Safety Precautions: Aspiration looking for blood return was conducted prior to all injections. At no point did we inject any substances, as a needle was being advanced. No attempts  were made at seeking any paresthesias. Safe injection practices and needle disposal techniques used. Medications properly checked for expiration dates. SDV (single dose vial) medications used. Description of the Procedure: Protocol guidelines were followed. The patient was placed in position over the procedure table. The target area was identified and the area prepped in the usual manner. The skin and muscle were infiltrated with local anesthetic. Appropriate amount of time allowed to pass for local anesthetics to take effect. Radiofrequency needles were introduced to the target area using fluoroscopic guidance. Using the NeuroTherm NT1100 Radiofrequency Generator, sensory stimulation using 50 Hz was used to locate & identify the nerve, making sure that the needle was positioned such that there was no sensory stimulation below 0.3 V or above 0.7 V. Stimulation using 2 Hz was used to evaluate the motor component. Care was taken not to lesion any nerves that demonstrated motor stimulation of the lower extremities at an output of less than 2.5 times that of the sensory threshold, or a maximum of 2.0 V. Once satisfactory placement of the needles was achieved, the numbing solution was slowly injected after negative aspiration. After waiting for at least 2 minutes, the ablation was performed at 80 degrees C for 60 seconds, using regular Radiofrequency settings. Once the procedure was completed, the needles were then removed and the area cleansed, making sure to leave some of the prepping  solution back to take advantage of its long term bactericidal properties. Intra-operative Compliance: Compliant Vitals:   05/04/19 1055 05/04/19 1101 05/04/19 1111 05/04/19 1121  BP: (!) 178/64 (!) 182/77 (!) 183/77 (!) 183/69  Pulse:      Resp: 19 14 16 18   Temp:  (!) 97.2 F (36.2 C)  (!) 97.2 F (36.2 C)  TempSrc:      SpO2: 98% 98% 96% 100%  Weight:      Height:        Start Time: 1033 hrs. End Time: 1051 hrs. Materials & Medications:  Needle(s) Type: Teflon-coated, curved tip, Radiofrequency needle(s) Gauge: 22G Length: 10cm Medication(s): Please see orders for medications and dosing details. 5 cc solution made of 4 cc of 0.2% ropivacaine, 1 cc of Decadron 10 mg/cc.  1.5 cc injected at each level above on the right after sensorimotor testing prior to lesioning. Imaging Guidance (Non-Spinal):          Type of Imaging Technique: Fluoroscopy Guidance (Non-Spinal) Indication(s): Assistance in needle guidance and placement for procedures requiring needle placement in or near specific anatomical locations not easily accessible without such assistance. Exposure Time: Please see nurses notes. Contrast: Before injecting any contrast, we confirmed that the patient did not have an allergy to iodine, shellfish, or radiological contrast. Once satisfactory needle placement was completed at the desired level, radiological contrast was injected. Contrast injected under live fluoroscopy. No contrast complications. See chart for type and volume of contrast used. Fluoroscopic Guidance: I was personally present during the use of fluoroscopy. "Tunnel Vision Technique" used to obtain the best possible view of the target area. Parallax error corrected before commencing the procedure. "Direction-depth-direction" technique used to introduce the needle under continuous pulsed fluoroscopy. Once target was reached, antero-posterior, oblique, and lateral fluoroscopic projection used confirm needle placement in  all planes. Images permanently stored in EMR. Interpretation: I personally interpreted the imaging intraoperatively. Adequate needle placement confirmed in multiple planes. Appropriate spread of contrast into desired area was observed. No evidence of afferent or efferent intravascular uptake. Permanent images saved into the patient's record.  Antibiotic Prophylaxis:  Anti-infectives (From admission, onward)   None     Indication(s): None identified  Post-operative Assessment:  Post-procedure Vital Signs:  Pulse/HCG Rate: 7367 Temp: (!) 97.2 F (36.2 C) Resp: 18 BP: (!) 183/69 SpO2: 100 %  EBL: None  Complications: No immediate post-treatment complications observed by team, or reported by patient.  Note: The patient tolerated the entire procedure well. A repeat set of vitals were taken after the procedure and the patient was kept under observation following institutional policy, for this type of procedure. Post-procedural neurological assessment was performed, showing return to baseline, prior to discharge. The patient was provided with post-procedure discharge instructions, including a section on how to identify potential problems. Should any problems arise concerning this procedure, the patient was given instructions to immediately contact us, at any time, without hesitation. In any case, we plan to contact the patient by telephone for a follow-up status report regarding this interventional procedure.  Comments:  No additional relevant information.  Plan of Care  Orders:  Orders Placed This Encounter  Procedures  . [facility] C-ARM 1-60 MIN NO REPORT    Intraoperative interpretation by procedural physician at [facility].    Standing Status:   Standing    Number of Occurrences:   1    Order Specific Question:   Reason for exam:    Answer:   Assistance in needle guidance and placement for procedures requiring needle placement in or near specific anatomical  locations not easily accessible without such assistance.   Medications ordered for procedure: Meds ordered this encounter  Medications  . lidocaine (XYLOCAINE) 2 % (with pres) injection 400 mg  . fentaNYL (SUBLIMAZE) injection 25-50 mcg    Make sure Narcan is available in the pyxis when using this medication. In the event of respiratory depression (RR< 8/min): Titrate NARCAN (naloxone) in increments of 0.1 to 0.2 mg IV at 2-3 minute intervals, until desired degree of reversal.  . dexamethasone (DECADRON) injection 10 mg  . ropivacaine (PF) 2 mg/mL (0.2%) (NAROPIN) injection 1 mL  . traMADol (ULTRAM) 50 MG tablet    Sig: Take 1 tablet (50 mg total) by mouth 2 (two) times daily as needed for severe pain.    Dispense:  60 tablet    Refill:  0    Fill one day early if pharmacy is closed on scheduled refill date.   Medications administered: We administered lidocaine, fentaNYL, dexamethasone, and ropivacaine (PF) 2 mg/mL (0.2%).  See the medical record for exact dosing, route, and time of administration.  Follow-up plan:   Return in about 4 weeks (around 06/01/2019) for Post Procedure Evaluation, virtual.      Status post right knee genicular nerve block 1 on 03/09/2019.  S/p right shoulder steroid injection 03/23/19. 05/04/2019:  R Genicular RFA.     Recent Visits Date Type Provider Dept  03/23/19 Procedure visit Gillis Santa, MD [facility]  03/09/19 Procedure visit Gillis Santa, MD [facility]  03/03/19 Office Visit Gillis Santa, MD [facility]  Showing recent visits within past 90 days and meeting all other requirements   Today's Visits Date Type Provider Dept  05/04/19 Procedure visit Gillis Santa, MD [facility]  Showing today's visits and meeting all other requirements   Future Appointments Date Type Provider Dept  06/01/19 Appointment Gillis Santa, MD [facility]  Showing future appointments within next 90 days and meeting  all other requirements   Disposition: Discharge home  Discharge Date & Time: 05/04/2019;  1122 hrs.   Primary Care Physician: Cletis Athens, MD Location: [facility] Note by: Gillis Santa, MD Date: 05/04/2019; Time: 11:52 AM  Disclaimer:  Medicine is not an exact science. The only guarantee in medicine is that nothing is guaranteed. It is important to note that the decision to proceed with this intervention was based on the information collected from the patient. The Data and conclusions were drawn from the patient's questionnaire, the interview, and the physical examination. Because the information was provided in large part by the patient, it cannot be guaranteed that it has not been purposely or unconsciously manipulated. Every effort has been made to obtain as much relevant data as possible for this evaluation. It is important to note that the conclusions that lead to this procedure are derived in large part from the available data. Always take into account that the treatment will also be dependent on availability of resources and existing treatment guidelines, considered by other Pain Management Practitioners as being common knowledge and practice, at the time of the intervention. For Medico-Legal purposes, it is also important to point out that variation in procedural techniques and pharmacological choices are the acceptable norm. The indications, contraindications, technique, and results of the above procedure should only be interpreted and judged by a Board-Certified Interventional Pain Specialist with extensive familiarity and expertise in the same exact procedure and technique.

## 2019-05-04 NOTE — Patient Instructions (Addendum)
No immunizations 14 days after steroid injection which is done today 05/04/19. NO FLU SHOT X 14 DAYS FROM 05/04/19  ____________________________________________________________________________________________  Post-Procedure Discharge Instructions  Instructions:  Apply ice:   Purpose: This will minimize any swelling and discomfort after procedure.   When: Day of procedure, as soon as you get home.  How: Fill a plastic sandwich bag with crushed ice. Cover it with a small towel and apply to injection site.  How long: (15 min on, 15 min off) Apply for 15 minutes then remove x 15 minutes.  Repeat sequence on day of procedure, until you go to bed.  Apply heat:   Purpose: To treat any soreness and discomfort from the procedure.  When: Starting the next day after the procedure.  How: Apply heat to procedure site starting the day following the procedure.  How long: May continue to repeat daily, until discomfort goes away.  Food intake: Start with clear liquids (like water) and advance to regular food, as tolerated.   Physical activities: Keep activities to a minimum for the first 8 hours after the procedure. After that, then as tolerated.  Driving: If you have received any sedation, be responsible and do not drive. You are not allowed to drive for 24 hours after having sedation.  Blood thinner: (Applies only to those taking blood thinners) You may restart your blood thinner 6 hours after your procedure.  Insulin: (Applies only to Diabetic patients taking insulin) As soon as you can eat, you may resume your normal dosing schedule.  Infection prevention: Keep procedure site clean and dry. Shower daily and clean area with soap and water.  Post-procedure Pain Diary: Extremely important that this be done correctly and accurately. Recorded information will be used to determine the next step in treatment. For the purpose of accuracy, follow these rules:  Evaluate only the area treated. Do  not report or include pain from an untreated area. For the purpose of this evaluation, ignore all other areas of pain, except for the treated area.  After your procedure, avoid taking a long nap and attempting to complete the pain diary after you wake up. Instead, set your alarm clock to go off every hour, on the hour, for the initial 8 hours after the procedure. Document the duration of the numbing medicine, and the relief you are getting from it.  Do not go to sleep and attempt to complete it later. It will not be accurate. If you received sedation, it is likely that you were given a medication that may cause amnesia. Because of this, completing the diary at a later time may cause the information to be inaccurate. This information is needed to plan your care.  Follow-up appointment: Keep your post-procedure follow-up evaluation appointment after the procedure (usually 2 weeks for most procedures, 6 weeks for radiofrequencies). DO NOT FORGET to bring you pain diary with you.   Expect: (What should I expect to see with my procedure?)  From numbing medicine (AKA: Local Anesthetics): Numbness or decrease in pain. You may also experience some weakness, which if present, could last for the duration of the local anesthetic.  Onset: Full effect within 15 minutes of injected.  Duration: It will depend on the type of local anesthetic used. On the average, 1 to 8 hours.   From steroids (Applies only if steroids were used): Decrease in swelling or inflammation. Once inflammation is improved, relief of the pain will follow.  Onset of benefits: Depends on the amount of swelling present.  The more swelling, the longer it will take for the benefits to be seen. In some cases, up to 10 days.  Duration: Steroids will stay in the system x 2 weeks. Duration of benefits will depend on multiple posibilities including persistent irritating factors.  Side-effects: If present, they may typically last 2 weeks (the  duration of the steroids).  Frequent: Cramps (if they occur, drink Gatorade and take over-the-counter Magnesium 450-500 mg once to twice a day); water retention with temporary weight gain; increases in blood sugar; decreased immune system response; increased appetite.  Occasional: Facial flushing (red, warm cheeks); mood swings; menstrual changes.  Uncommon: Long-term decrease or suppression of natural hormones; bone thinning. (These are more common with higher doses or more frequent use. This is why we prefer that our patients avoid having any injection therapies in other practices.)   Very Rare: Severe mood changes; psychosis; aseptic necrosis.  From procedure: Some discomfort is to be expected once the numbing medicine wears off. This should be minimal if ice and heat are applied as instructed.  Call if: (When should I call?)  You experience numbness and weakness that gets worse with time, as opposed to wearing off.  New onset bowel or bladder incontinence. (Applies only to procedures done in the spine)  Emergency Numbers:  Durning business hours (Monday - Thursday, 8:00 AM - 4:00 PM) (Friday, 9:00 AM - 12:00 Noon): [phone]  After hours: [phone]  NOTE: If you are having a problem and are unable connect with, or to talk to a provider, then go to your nearest urgent care or emergency department. If the problem is serious and urgent, please call 911. ____________________________________________________________________________________________   Tramadol tablets What is this medicine? TRAMADOL (TRA ma dole) is a pain reliever. It is used to treat moderate to severe pain in adults. This medicine may be used for other purposes; ask your health care provider or pharmacist if you have questions. COMMON BRAND NAME(S): Ultram What should I tell my health care provider before I take this medicine? They need to know if you have any of these conditions:  brain  tumor  depression  drug abuse or addiction  head injury  if you frequently drink alcohol containing drinks  kidney disease or trouble passing urine  liver disease  lung disease, asthma, or breathing problems  seizures or epilepsy  suicidal thoughts, plans, or attempt; a previous suicide attempt by you or a family member  an unusual or allergic reaction to tramadol, codeine, other medicines, foods, dyes, or preservatives  pregnant or trying to get pregnant  breast-feeding How should I use this medicine? Take this medicine by mouth with a full glass of water. Follow the directions on the prescription label. You can take it with or without food. If it upsets your stomach, take it with food. Do not take your medicine more often than directed. A special MedGuide will be given to you by the pharmacist with each prescription and refill. Be sure to read this information carefully each time. Talk to your pediatrician regarding the use of this medicine in children. Special care may be needed. Overdosage: If you think you have taken too much of this medicine contact a poison control center or emergency room at once. NOTE: This medicine is only for you. Do not share this medicine with others. What if I miss a dose? If you miss a dose, take it as soon as you can. If it is almost time for your next dose, take  only that dose. Do not take double or extra doses. What may interact with this medicine? Do not take this medication with any of the following medicines:  MAOIs like Carbex, Eldepryl, Marplan, Nardil, and Parnate This medicine may also interact with the following medications:  alcohol  antihistamines for allergy, cough and cold  certain medicines for anxiety or sleep  certain medicines for depression like amitriptyline, fluoxetine, sertraline  certain medicines for migraine headache like almotriptan, eletriptan, frovatriptan, naratriptan, rizatriptan, sumatriptan,  zolmitriptan  certain medicines for seizures like carbamazepine, oxcarbazepine, phenobarbital, primidone  certain medicines that treat or prevent blood clots like warfarin  digoxin  furazolidone  general anesthetics like halothane, isoflurane, methoxyflurane, propofol  linezolid  local anesthetics like lidocaine, pramoxine, tetracaine  medicines that relax muscles for surgery  other narcotic medicines for pain or cough  phenothiazines like chlorpromazine, mesoridazine, prochlorperazine, thioridazine  procarbazine This list may not describe all possible interactions. Give your health care provider a list of all the medicines, herbs, non-prescription drugs, or dietary supplements you use. Also tell them if you smoke, drink alcohol, or use illegal drugs. Some items may interact with your medicine. What should I watch for while using this medicine? Tell your doctor or health care provider if your pain does not go away, if it gets worse, or if you have new or a different type of pain. You may develop tolerance to the medicine. Tolerance means that you will need a higher dose of the medicine for pain relief. Tolerance is normal and is expected if you take this medicine for a long time. This medicine may cause serious skin reactions. They can happen weeks to months after starting the medicine. Contact your health care provider right away if you notice fevers or flu-like symptoms with a rash. The rash may be red or purple and then turn into blisters or peeling of the skin. Or, you might notice a red rash with swelling of the face, lips or lymph nodes in your neck or under your arms. Do not suddenly stop taking your medicine because you may develop a severe reaction. Your body becomes used to the medicine. This does NOT mean you are addicted. Addiction is a behavior related to getting and using a drug for a non-medical reason. If you have pain, you have a medical reason to take pain medicine. Your  doctor will tell you how much medicine to take. If your doctor wants you to stop the medicine, the dose will be slowly lowered over time to avoid any side effects. There are different types of narcotic medicines (opiates). If you take more than one type at the same time or if you are taking another medicine that also causes drowsiness, you may have more side effects. Give your health care provider a list of all medicines you use. Your doctor will tell you how much medicine to take. Do not take more medicine than directed. Call emergency for help if you have problems breathing or unusual sleepiness. You may get drowsy or dizzy. Do not drive, use machinery, or do anything that needs mental alertness until you know how this medicine affects you. Do not stand or sit up quickly, especially if you are an older patient. This reduces the risk of dizzy or fainting spells. Alcohol can increase or decrease the effects of this medicine. Avoid alcoholic drinks. You may have constipation. Try to have a bowel movement at least every 2 to 3 days. If you do not have a bowel movement for  3 days, call your doctor or health care provider. Your mouth may get dry. Chewing sugarless gum or sucking hard candy, and drinking plenty of water may help. Contact your doctor if the problem does not go away or is severe. What side effects may I notice from receiving this medicine? Side effects that you should report to your doctor or health care professional as soon as possible:  allergic reactions like skin rash, itching or hives, swelling of the face, lips, or tongue  breathing problems  confusion  redness, blistering, peeling or loosening of the skin, including inside the mouth  seizures  signs and symptoms of low blood pressure like dizziness; feeling faint or lightheaded, falls; unusually weak or tired  trouble passing urine or change in the amount of urine Side effects that usually do not require medical attention  (report to your doctor or health care professional if they continue or are bothersome):  constipation  dry mouth  nausea, vomiting  tiredness This list may not describe all possible side effects. Call your doctor for medical advice about side effects. You may report side effects to FDA at 1-800-FDA-1088. Where should I keep my medicine? Keep out of the reach of children. This medicine may cause accidental overdose and death if it taken by other adults, children, or pets. Mix any unused medicine with a substance like cat litter or coffee grounds. Then throw the medicine away in a sealed container like a sealed bag or a coffee can with a lid. Do not use the medicine after the expiration date. Store at room temperature between 15 and 30 degrees C (59 and 86 degrees F). NOTE: This sheet is a summary. It may not cover all possible information. If you have questions about this medicine, talk to your doctor, pharmacist, or health care provider.  2020 Elsevier/Gold Standard (2018-10-17 15:56:48) Post-procedure Information What to expect: Most procedures involve the use of a local anesthetic (numbing medicine), and a steroid (anti-inflammatory medicine).  The local anesthetics may cause temporary numbness and weakness of the legs or arms, depending on the location of the block. This numbness/weakness may last 4-6 hours, depending on the local anesthetic used. In rare instances, it can last up to 24 hours. While numb, you must be very careful not to injure the extremity.  After any procedure, you could expect the pain to get better within 15-20 minutes. This relief is temporary and may last 4-6 hours. Once the local anesthetics wears off, you could experience discomfort, possibly more than usual, for up to 10 (ten) days. In the case of radiofrequencies, it may last up to 6 weeks. Surgeries may take up to 8 weeks for the healing process. The discomfort is due to the irritation caused by needles going  through skin and muscle. To minimize the discomfort, we recommend using ice the first day, and heat from then on. The ice should be applied for 15 minutes on, and 15 minutes off. Keep repeating this cycle until bedtime. Avoid applying the ice directly to the skin, to prevent frostbite. Heat should be used daily, until the pain improves (4-10 days). Be careful not to burn yourself.  Occasionally you may experience muscle spasms or cramps. These occur as a consequence of the irritation caused by the needle sticks to the muscle and the blood that will inevitably be lost into the surrounding muscle tissue. Blood tends to be very irritating to tissues, which tend to react by going into spasm. These spasms may start the same  day of your procedure, but they may also take days to develop. This late onset type of spasm or cramp is usually caused by electrolyte imbalances triggered by the steroids, at the level of the kidney. Cramps and spasms tend to respond well to muscle relaxants, multivitamins (some are triggered by the procedure, but may have their origins in vitamin deficiencies), and "Gatorade", or any sports drinks that can replenish any electrolyte imbalances. (If you are a diabetic, ask your pharmacist to get you a sugar-free brand.) Warm showers or baths may also be helpful. Stretching exercises are highly recommended. General Instructions:  Be alert for signs of possible infection: redness, swelling, heat, red streaks, elevated temperature, and/or fever. These typically appear 4 to 6 days after the procedure. Immediately notify your doctor if you experience unusual bleeding, difficulty breathing, or loss of bowel or bladder control. If you experience increased pain, do not increase your pain medicine intake, unless instructed by your pain physician. Post-Procedure Care:  Be careful in moving about. Muscle spasms in the area of the injection may occur. Applying ice or heat to the area is often helpful. The  incidence of spinal headaches after epidural injections ranges between 1.4% and 6%. If you develop a headache that does not seem to respond to conservative therapy, please let your physician know. This can be treated with an epidural blood patch.   Post-procedure numbness or redness is to be expected, however it should average 4 to 6 hours. If numbness and weakness of your extremities begins to develop 4 to 6 hours after your procedure, and is felt to be progressing and worsening, immediately contact your physician.   Diet:  If you experience nausea, do not eat until this sensation goes away. If you had a "Stellate Ganglion Block" for upper extremity "Reflex Sympathetic Dystrophy", do not eat or drink until your hoarseness goes away. In any case, always start with liquids first and if you tolerate them well, then slowly progress to more solid foods. Activity:  For the first 4 to 6 hours after the procedure, use caution in moving about as you may experience numbness and/or weakness. Use caution in cooking, using household electrical appliances, and climbing steps. If you need to reach your Doctor call our office: [phone] Monday-Thursday 8:00 am - 4:00 PM    Fridays: Closed     In case of an emergency: In case of emergency, call 911 or go to the nearest emergency room and have the physician there call us.  Interpretation of Procedure Every nerve block has two components: a diagnostic component, and a treatment component. Unrealistic expectations are the most common causes of "perceived failure".  In a perfect world, a single nerve block should be able to completely and permanently eliminate the pain. Sadly, the world is not perfect.  Most pain management nerve blocks are performed using local anesthetics and steroids. Steroids are responsible for any long-term benefit that you may experience. Their purpose is to decrease any chronic swelling that may exist in the area. Steroids begin to work  immediately after being injected. However, most patients will not experience any benefits until 5 to 10 days after the injection, when the swelling has come down to the point where they can tell a difference. Steroids will only help if there is swelling to be treated. As such, they can assist with the diagnosis. If effective, they suggest an inflammatory component to the pain, and if ineffective, they rule out inflammation as the main cause or  component of the problem. If the problem is one of mechanical compression, you will get no benefit from those steroids.   In the case of local anesthetics, they have a crucial role in the diagnosis of your condition. Most will begin to work within15 to 20 minutes after injection. The duration will depend on the type used (short- vs. Long-acting). It is of outmost importance that patients keep tract of their pain, after the procedure. To assist with this matter, a "Post-procedure Pain Diary" is provided. Make sure to complete it and to bring it back to your follow-up appointment.  As long as the patient keeps accurate, detailed records of their symptoms after every procedure, and returns to have those interpreted, every procedure will provide [place] with invaluable information. Even a block that does not provide the patient with any relief, will always provide [place] with information about the mechanism and the origin of the pain. The only time a nerve block can be considered a waste of time is when patients do not keep track of the results, or do not keep their post-procedure appointment.  Reporting the results back to your physician The Pain Score  Pain is a subjective complaint. It cannot be seen, touched, or measured. We depend entirely on the patient's report of the pain in order to assess your condition and treatment. To evaluate the pain, we use a pain scale, where "0" means "No Pain", and a "10" is "the worst possible pain that you can even imagine" (i.e. something like  been eaten alive by a shark or being torn apart by a lion).   You will frequently be asked to rate your pain. Please be as accurate, remember that medical decisions will be based on your responses. Please do not rate your pain above a 10. Doing so is actually interpreted as "symptom magnification" (exaggeration), as well as lack of understanding with regards to the scale. To put this into perspective, when you tell us that your pain is at a 10 (ten), what you are saying is that there is nothing we can do to make this pain any worse. (Carefully think about that.)

## 2019-05-04 NOTE — Progress Notes (Signed)
Safety precautions to be maintained throughout the outpatient stay will include: orient to surroundings, keep bed in low position, maintain call bell within reach at all times, provide assistance with transfer out of bed and ambulation.  

## 2019-05-05 ENCOUNTER — Telehealth: Payer: Self-pay

## 2019-05-05 NOTE — Telephone Encounter (Signed)
Post procedure phone call.  States she is doing pretty good.

## 2019-05-23 ENCOUNTER — Inpatient Hospital Stay: Admit: 2019-05-23 | Payer: Medicare Other | Admitting: Orthopedic Surgery

## 2019-05-23 SURGERY — ARTHROPLASTY, KNEE, TOTAL, USING IMAGELESS COMPUTER-ASSISTED NAVIGATION
Anesthesia: Choice | Laterality: Right

## 2019-05-28 ENCOUNTER — Encounter: Payer: Self-pay | Admitting: Student in an Organized Health Care Education/Training Program

## 2019-06-01 ENCOUNTER — Encounter: Payer: Self-pay | Admitting: Student in an Organized Health Care Education/Training Program

## 2019-06-01 ENCOUNTER — Other Ambulatory Visit: Payer: Self-pay

## 2019-06-01 ENCOUNTER — Ambulatory Visit
Payer: Medicare Other | Attending: Student in an Organized Health Care Education/Training Program | Admitting: Student in an Organized Health Care Education/Training Program

## 2019-06-01 DIAGNOSIS — M1712 Unilateral primary osteoarthritis, left knee: Secondary | ICD-10-CM

## 2019-06-01 DIAGNOSIS — M1711 Unilateral primary osteoarthritis, right knee: Secondary | ICD-10-CM

## 2019-06-01 DIAGNOSIS — M19111 Post-traumatic osteoarthritis, right shoulder: Secondary | ICD-10-CM

## 2019-06-01 DIAGNOSIS — M19011 Primary osteoarthritis, right shoulder: Secondary | ICD-10-CM | POA: Diagnosis not present

## 2019-06-01 DIAGNOSIS — G894 Chronic pain syndrome: Secondary | ICD-10-CM

## 2019-06-01 DIAGNOSIS — S46001S Unspecified injury of muscle(s) and tendon(s) of the rotator cuff of right shoulder, sequela: Secondary | ICD-10-CM

## 2019-06-01 DIAGNOSIS — M170 Bilateral primary osteoarthritis of knee: Secondary | ICD-10-CM

## 2019-06-01 MED ORDER — TRAMADOL HCL 50 MG PO TABS: 100.0000 mg | ORAL_TABLET | Freq: Two times a day (BID) | ORAL | 0 refills | Status: DC | PRN

## 2019-06-01 NOTE — Progress Notes (Signed)
Pain Management Virtual Encounter Note - Virtual Visit via Telephone Telehealth (real-time audio visits between healthcare provider and patient).   Patient's Phone No. & Preferred Pharmacy:  [phone] (home); There is no such number on file (mobile).; (Preferred) [phone] No e-mail address on record  [place], [place] - [address] [place] [address] Phone: [phone] Fax: [phone]  [facility] [place], [place] [address] [place] [address] Phone: [phone] Fax: [phone]  [facility] - [place], [place] [address] [place] [address] Phone: [phone] Fax: [phone]  [place], [place] - [address] [address] Phone: [phone] Fax: [phone]    Pre-screening note:  Our staff contacted Sue Knapp and offered her an "in person", "face-to-face" appointment versus a telephone encounter. She indicated preferring the telephone encounter, at this time.   Reason for Virtual Visit: COVID-19*  Social distancing based on CDC and AMA recommendations.   I contacted Sue Knapp on 06/01/2019 via telephone.      I clearly identified myself as Gillis Santa, MD. I verified that I was speaking with the correct person using two identifiers (Name: Sue Knapp, and date of birth: 10/30/1922).  Advanced Informed Consent I sought verbal advanced consent from Sue Knapp for virtual visit interactions. I informed Sue Knapp of possible security and privacy concerns, risks, and limitations associated with providing "not-in-person" medical evaluation and management services. I also informed Sue Knapp of the availability of "in-person" appointments. Finally, I informed her that there would be a charge for the virtual visit and that she could be  personally, fully  or partially, financially responsible for it. Sue Knapp expressed understanding and agreed to proceed.   Historic Elements   Sue Knapp is a 85 y.o. year old, female patient evaluated today after her last encounter by our practice on 05/05/2019. Sue Knapp  has a past medical history of Arthritis, Breast cancer ([place]) (2013), Cataract, Depression, Fatigue, GERD (gastroesophageal reflux disease), Glaucoma, Heart murmur, Heart palpitations, Hiatal hernia, HOH (hard of hearing), Hyperlipidemia, Hypertension, Hypothyroidism, Left ankle injury, Left bundle branch block, Neuropathy, Osteoporosis, Pacemaker, Palpitations, Personal history of radiation therapy (2013), Scoliosis, Shortness of breath dyspnea, Thyroid nodule, and TIA (transient ischemic attack). She also  has a past surgical history that includes Thyroidectomy, partial; Abdominal hysterectomy; Breast surgery; Cataract extraction w/PHACO (Right, 03/01/2015); Cataract extraction w/PHACO (Left, 03/22/2015); Mastectomy partial / lumpectomy; Breast lumpectomy (Left, 02/21/2012); Breast biopsy (Left, 2005); and Breast biopsy (Left, 2013). Sue Knapp has a current medication list which includes the following prescription(s): acetaminophen, alendronate, alprazolam, aspirin ec, calcium-vitamin d, cholecalciferol, cyanocobalamin, diclofenac sodium, ferrous sulfate, gabapentin, hydrochlorothiazide, ibuprofen, latanoprost, letrozole, losartan, metoprolol succinate, multivitamin with minerals, tramadol, and vitamin c. She  reports that she has never smoked. She has never used smokeless tobacco. She reports that she does not drink alcohol or use drugs. Sue Knapp is allergic to codeine; etodolac; and zolpidem.   HPI  Today, she is being contacted for a post-procedure assessment.  Evaluation of last interventional procedure  05/04/2019 Procedure:   Type: Therapeutic Superior-lateral, Superior-medial, and Inferior-medial, Genicular Nerve Radiofrequency  Ablation.  #1  Region: Lateral, Anterior, and Medial aspects of the knee joint, above and below the knee joint proper. Level: Superior and inferior to the knee joint. Laterality: Right  Sedation: Please  see nurses note for DOS. When no sedatives are used, the analgesic levels obtained are directly associated to the effectiveness of the local anesthetics. However, when sedation is provided, the level of analgesia obtained during the initial 1 hour following the intervention, is believed to be the result of a combination of factors. These factors may include, but are not limited to: 1. The effectiveness of the local anesthetics used. 2. The effects of the analgesic(s) and/or anxiolytic(s) used. 3. The degree of discomfort experienced by the patient at the time of the procedure. 4. The patients ability and reliability in recalling and recording the events. 5. The presence and influence of possible secondary gains and/or psychosocial factors. Reported result: Relief experienced during the 1st hour after the procedure: 100 % (Ultra-Short Term Relief)            Interpretative annotation: Clinically appropriate result. Analgesia during this period is likely to be Local Anesthetic and/or IV Sedative (Analgesic/Anxiolytic) related.          Effects of local anesthetic: The analgesic effects attained during this period are directly associated to the localized infiltration of local anesthetics and therefore cary significant diagnostic value as to the etiological location, or anatomical origin, of the pain. Expected duration of relief is directly dependent on the pharmacodynamics of the local anesthetic used. Long-acting (4-6 hours) anesthetics used.  Reported result: Relief during the next 4 to 6 hour after the procedure: 100 % (Short-Term Relief)            Interpretative annotation: Clinically appropriate result. Analgesia during this period is likely to be Local Anesthetic-related.          Long-term  benefit: Defined as the period of time past the expected duration of local anesthetics (1 hour for short-acting and 4-6 hours for long-acting). With the possible exception of prolonged sympathetic blockade from the local anesthetics, benefits during this period are typically attributed to, or associated with, other factors such as analgesic sensory neuropraxia, antiinflammatory effects, or beneficial biochemical changes provided by agents other than the local anesthetics.  Reported result: Extended relief following procedure: 30 %(pain relief last approx 2 - 3 days at around 30% and then the pain returned.  She is having a great deal of pain when walking or performing ADL's) (Long-Term Relief)            Interpretative annotation: Clinically possible results. Recurrence of symptoms. Incomplete therapeutic success.             Laboratory Chemistry Profile (12 mo)  Renal: No results found for requested labs within last 8760 hours.  Lab Results  Component Value Date   GFRAA 59 (L) 06/09/2013   GFRNONAA 51 (L) 06/09/2013   Hepatic: No results found for requested labs within last 8760 hours. Lab Results  Component Value Date   AST 30 05/28/2013   ALT 13 05/28/2013   Other: No results found for requested labs within last 8760 hours. Note: Above Lab results reviewed.  Imaging  Last 90 days:  Dg Pain Clinic C-arm 1-60 Min No Report  Result Date: 05/04/2019 Fluoro was used, but no Radiologist interpretation will be provided. Please refer to "NOTES" tab for provider progress note.  Dg Pain Clinic C-arm 1-60 Min No Report  Result Date: 03/23/2019 Fluoro was used, but no Radiologist interpretation will be provided. Please refer to "NOTES" tab for provider progress note.  Dg Pain Clinic C-arm 1-60 Min No Report  Result Date: 03/09/2019 Fluoro was used, but no Radiologist  interpretation will be provided. Please refer to "NOTES" tab for provider progress note.  Mm 3d Screen Breast  Bilateral  Result Date: 03/12/2019 CLINICAL DATA:  Screening. EXAM: DIGITAL SCREENING BILATERAL MAMMOGRAM WITH TOMO AND CAD COMPARISON:  Previous exam(s). ACR Breast Density Category b: There are scattered areas of fibroglandular density. FINDINGS: There are no findings suspicious for malignancy. Images were processed with CAD. IMPRESSION: No mammographic evidence of malignancy. A result letter of this screening mammogram will be mailed directly to the patient. RECOMMENDATION: Screening mammogram in one year. (Code:SM-B-01Y) BI-RADS CATEGORY  1: Negative. Electronically Signed   By: Franki Cabot M.D.   On: 03/12/2019 17:10    Assessment  The primary encounter diagnosis was Primary osteoarthritis of right knee. Diagnoses of Osteoarthritis of right shoulder due to rotator cuff injury, Osteoarthritis of right shoulder, unspecified osteoarthritis type, Primary osteoarthritis of left knee, Bilateral primary osteoarthritis of knee, and Chronic pain syndrome were also pertinent to this visit.  Plan of Care  I have changed Joesph Fillers. Erisman's traMADol. I am also having her maintain her aspirin EC, latanoprost, letrozole, losartan, Cholecalciferol, Cyanocobalamin (VITAMIN B-12 PO), alendronate, gabapentin, hydrochlorothiazide, metoprolol succinate, ALPRAZolam, acetaminophen, ibuprofen, multivitamin with minerals, calcium-vitamin D, vitamin C, ferrous sulfate, and diclofenac sodium.  Patient unfortunately did not obtain significant pain relief after her radiofrequency ablation.  In regards to medication management, patient is utilizing tramadol 50 mg twice a day which she states is not very helpful.  We discussed increasing to 100 mg twice daily to see if it helps out with her pain.  If not may need to consider something stronger such as hydrocodone.  Risks and benefits reviewed.  Pharmacotherapy (Medications Ordered): Meds ordered this encounter  Medications  . traMADol (ULTRAM) 50 MG tablet    Sig: Take 2  tablets (100 mg total) by mouth 2 (two) times daily as needed for severe pain.    Dispense:  120 tablet    Refill:  0    Fill one day early if pharmacy is closed on scheduled refill date.    Follow-up plan:   Return in about 3 weeks (around 06/22/2019) for Medication Management, virtual.     Status post right knee genicular nerve block 1 on 03/09/2019.  S/p right shoulder steroid injection 03/23/19. 05/04/2019:  R Genicular RFA- not effective.      Recent Visits Date Type Provider Dept  05/04/19 Procedure visit Gillis Santa, MD [facility]  03/23/19 Procedure visit Gillis Santa, MD [facility]  03/09/19 Procedure visit Gillis Santa, MD [facility]  03/03/19 Office Visit Gillis Santa, MD [facility]  Showing recent visits within past 90 days and meeting all other requirements   Today's Visits Date Type Provider Dept  06/01/19 Office Visit Gillis Santa, MD [facility]  Showing today's visits and meeting all other requirements   Future Appointments No visits were found meeting these conditions.  Showing future appointments within next 90 days and meeting all other requirements   I discussed the assessment and treatment plan with the patient. The patient was provided an opportunity to ask questions and all were answered. The patient agreed with the plan and demonstrated an understanding of the instructions.  Patient advised to call back or seek an in-person evaluation if the symptoms or condition worsens.  Total duration of non-face-to-face encounter: 25 minutes.  Note by: Gillis Santa, MD Date: 06/01/2019; Time: 3:27 PM  Note: This dictation was prepared with Dragon dictation. Any transcriptional errors that may  result from this process are unintentional.  Disclaimer:  * Given the special circumstances of the COVID-19 pandemic, the federal government has announced that the Office for Civil Rights (OCR) will exercise its  enforcement discretion and will not impose penalties on physicians using telehealth in the event of noncompliance with regulatory requirements under the [place] and [place] (HIPAA) in connection with the good faith provision of telehealth during the XX123456 national public health emergency. ([place])

## 2019-06-17 ENCOUNTER — Encounter: Payer: Self-pay | Admitting: Student in an Organized Health Care Education/Training Program

## 2019-06-17 ENCOUNTER — Telehealth: Payer: Self-pay

## 2019-06-17 NOTE — Telephone Encounter (Signed)
Patient states that she has a bruise covering her entire shoulder where she had an injection the last time.  States that her Physical therapist noticed it.  States she had this same bruise appear after her injection on 03-23-19 but it faded away after a couple of weeks but has not reappeared.  States there is no pain involved.  Thinks her PT may have taken a picture of it and was going to call her to see if she did and she would send it to [place].

## 2019-06-22 ENCOUNTER — Telehealth: Payer: Self-pay | Admitting: Student in an Organized Health Care Education/Training Program

## 2019-06-22 ENCOUNTER — Other Ambulatory Visit: Payer: Self-pay

## 2019-06-22 ENCOUNTER — Encounter: Payer: Self-pay | Admitting: Student in an Organized Health Care Education/Training Program

## 2019-06-22 ENCOUNTER — Ambulatory Visit
Payer: Medicare Other | Attending: Student in an Organized Health Care Education/Training Program | Admitting: Student in an Organized Health Care Education/Training Program

## 2019-06-22 DIAGNOSIS — S46001S Unspecified injury of muscle(s) and tendon(s) of the rotator cuff of right shoulder, sequela: Secondary | ICD-10-CM

## 2019-06-22 DIAGNOSIS — M1711 Unilateral primary osteoarthritis, right knee: Secondary | ICD-10-CM | POA: Diagnosis not present

## 2019-06-22 DIAGNOSIS — M19011 Primary osteoarthritis, right shoulder: Secondary | ICD-10-CM

## 2019-06-22 DIAGNOSIS — M170 Bilateral primary osteoarthritis of knee: Secondary | ICD-10-CM

## 2019-06-22 DIAGNOSIS — M1712 Unilateral primary osteoarthritis, left knee: Secondary | ICD-10-CM

## 2019-06-22 DIAGNOSIS — G894 Chronic pain syndrome: Secondary | ICD-10-CM

## 2019-06-22 DIAGNOSIS — M19111 Post-traumatic osteoarthritis, right shoulder: Secondary | ICD-10-CM

## 2019-06-22 NOTE — Progress Notes (Signed)
Pain Management Virtual Encounter Note - Virtual Visit via Telephone Telehealth (real-time audio visits between healthcare provider and patient).   Patient's Phone No. & Preferred Pharmacy:  [phone] (home); There is no such number on file (mobile).; (Preferred) [phone] No e-mail address on record  [place], [place] - [address] [place] [address] Phone: [phone] Fax: [phone]  [facility] [place], [place] [address] [place] [address] Phone: [phone] Fax: [phone]  [facility] - [place], [place] [address] [place] [address] Phone: [phone] Fax: [phone]  [place], [place] - [address] [place] [address] Phone: [phone] Fax: [phone]    Pre-screening note:  Our staff contacted Ms. Brahmbhatt and offered her an "in person", "face-to-face" appointment versus a telephone encounter. She indicated preferring the telephone encounter, at this time.   Reason for Virtual Visit: COVID-19*  Social distancing based on CDC and AMA recommendations.   I contacted Sue Knapp on 06/22/2019 via telephone.      I clearly identified myself as Gillis Santa, MD. I verified that I was speaking with the correct person using two identifiers (Name: Sue Knapp, and date of birth: 1923/02/13).  Advanced Informed Consent I sought verbal advanced consent from Sue Knapp for virtual visit interactions. I informed Ms. Colli of possible security and privacy concerns, risks, and limitations associated with providing "not-in-person" medical evaluation and management services. I also informed Ms. Gingras of the availability of "in-person" appointments. Finally, I informed her that there would be a charge for the virtual visit and that she could be  personally, fully  or partially, financially responsible for it. Ms. Beucler expressed understanding and agreed to proceed.   Historic Elements   [name] is a 85 y.o. year old, female patient evaluated today after her last encounter by our practice on 06/22/2019. Sue Knapp  has a past medical history of Arthritis, Breast cancer ([place]) (2013), Cataract, Depression, Fatigue, GERD (gastroesophageal reflux disease), Glaucoma, Heart murmur, Heart palpitations, Hiatal hernia, HOH (hard of hearing), Hyperlipidemia, Hypertension, Hypothyroidism, Left ankle injury, Left bundle branch block, Neuropathy, Osteoporosis, Pacemaker, Palpitations, Personal history of radiation therapy (2013), Scoliosis, Shortness of breath dyspnea, Thyroid nodule, and TIA (transient ischemic attack). She also  has a past surgical history that includes Thyroidectomy, partial; Abdominal hysterectomy; Breast surgery; Cataract extraction w/PHACO (Right, 03/01/2015); Cataract extraction w/PHACO (Left, 03/22/2015); Mastectomy partial / lumpectomy; Breast lumpectomy (Left, 02/21/2012); Breast biopsy (Left, 2005); and Breast biopsy (Left, 2013). Sue Knapp has a current medication list which includes the following prescription(s): acetaminophen, alendronate, alprazolam, aspirin ec, calcium-vitamin d, cholecalciferol, cyanocobalamin, diclofenac sodium, ferrous sulfate, gabapentin, hydrochlorothiazide, ibuprofen, latanoprost, letrozole, losartan, metoprolol succinate, multivitamin with minerals, and vitamin c. She  reports that she has never smoked. She has never used smokeless tobacco. She reports that she does not drink alcohol or use drugs. Sue Knapp is allergic to codeine; etodolac; and zolpidem.   HPI  Today, she is being contacted for medication management.  States that she was experiencing memory and cognitive side effects with Tramadol. Stopped Tramadol and states that side effects improved Hasn't been feeling well overall- endorsing right  shoulder pain and discomfort Did receive flu shot with PCP Wants to hold off on restarting any opioid analgesics as discontinuation from Tramadol did help cognitive and constipation  symptoms  Laboratory Chemistry Profile (12 mo)  Renal: No results found for requested labs within last 8760 hours.  Lab Results  Component Value Date   GFRAA 59 (L) 06/09/2013   GFRNONAA 51 (L) 06/09/2013   Hepatic: No results found for requested labs within last 8760 hours. Lab Results  Component Value Date   AST 30 05/28/2013   ALT 13 05/28/2013   Other: No results found for requested labs within last 8760 hours. Note: Above Lab results reviewed.  Assessment  The primary encounter diagnosis was Primary osteoarthritis of right knee. Diagnoses of Primary osteoarthritis of left knee, Osteoarthritis of right shoulder due to rotator cuff injury, Osteoarthritis of right shoulder, unspecified osteoarthritis type, Bilateral primary osteoarthritis of knee, and Chronic pain syndrome were also pertinent to this visit.  Plan of Care   I have discontinued Sigrid Leap. Berzins's traMADol. I am also having her maintain her aspirin EC, latanoprost, letrozole, losartan, Cholecalciferol, Cyanocobalamin (VITAMIN B-12 PO), alendronate, gabapentin, hydrochlorothiazide, metoprolol succinate, ALPRAZolam, acetaminophen, ibuprofen, multivitamin with minerals, calcium-vitamin D, vitamin C, ferrous sulfate, and diclofenac sodium.  -D/C Tramadol given cognitive or GI side effects, do not recommend additional opioid therapy -Follow up PRN  Follow-up plan:   Return if symptoms worsen or fail to improve.     Status post right knee genicular nerve block 1 on 03/09/2019.  S/p right shoulder steroid injection 03/23/19. 05/04/2019:  R Genicular RFA- not effective.       Recent Visits Date Type Provider Dept  06/01/19 Office Visit Gillis Santa, MD [facility]  05/04/19 Procedure visit Gillis Santa, MD [facility]   Showing recent visits within past 90 days and meeting all other requirements   Today's Visits Date Type Provider Dept  06/22/19 Office Visit Gillis Santa, MD [facility]  Showing today's visits and meeting all other requirements   Future Appointments No visits were found meeting these conditions.  Showing future appointments within next 90 days and meeting all other requirements   I discussed the assessment and treatment plan with the patient. The patient was provided an opportunity to ask questions and all were answered. The patient agreed with the plan and demonstrated an understanding of the instructions.  Patient advised to call back or seek an in-person evaluation if the symptoms or condition worsens.  Total duration of non-face-to-face encounter: 65minutes.  Note by: Gillis Santa, MD Date: 06/22/2019; Time: 1:01 PM  Note: This dictation was prepared with Dragon dictation. Any transcriptional errors that may result from this process are unintentional.  Disclaimer:  * Given the special circumstances of the COVID-19 pandemic, the federal government has announced that the Office for Civil Rights (OCR) will exercise its enforcement discretion and will not impose penalties on physicians using telehealth in the event of noncompliance with regulatory requirements under the [place] and [place] (HIPAA) in connection with the good faith provision of telehealth during the XX123456 national public health emergency. ([place])

## 2019-06-22 NOTE — Telephone Encounter (Signed)
Patient returned call to nurses for virtual appt today 06-22-19

## 2019-09-14 ENCOUNTER — Emergency Department: Payer: Medicare Other

## 2019-09-14 ENCOUNTER — Encounter: Payer: Self-pay | Admitting: Emergency Medicine

## 2019-09-14 ENCOUNTER — Emergency Department
Admission: EM | Admit: 2019-09-14 | Discharge: 2019-09-14 | Disposition: A | Discharge status: Home / Self Care | Payer: Medicare Other | Attending: Student | Admitting: Student

## 2019-09-14 DIAGNOSIS — E860 Dehydration: Secondary | ICD-10-CM | POA: Insufficient documentation

## 2019-09-14 DIAGNOSIS — R42 Dizziness and giddiness: Secondary | ICD-10-CM | POA: Insufficient documentation

## 2019-09-14 DIAGNOSIS — I10 Essential (primary) hypertension: Secondary | ICD-10-CM | POA: Diagnosis not present

## 2019-09-14 DIAGNOSIS — K5900 Constipation, unspecified: Secondary | ICD-10-CM | POA: Diagnosis not present

## 2019-09-14 DIAGNOSIS — Z79899 Other long term (current) drug therapy: Secondary | ICD-10-CM | POA: Insufficient documentation

## 2019-09-14 DIAGNOSIS — M170 Bilateral primary osteoarthritis of knee: Secondary | ICD-10-CM | POA: Insufficient documentation

## 2019-09-14 DIAGNOSIS — N390 Urinary tract infection, site not specified: Secondary | ICD-10-CM

## 2019-09-14 DIAGNOSIS — H538 Other visual disturbances: Secondary | ICD-10-CM | POA: Insufficient documentation

## 2019-09-14 DIAGNOSIS — R1084 Generalized abdominal pain: Secondary | ICD-10-CM | POA: Diagnosis present

## 2019-09-14 LAB — COMPREHENSIVE METABOLIC PANEL
ALT: 13 U/L (ref 0–44)
AST: 21 U/L (ref 15–41)
Albumin: 3.8 g/dL (ref 3.5–5.0)
Alkaline Phosphatase: 64 U/L (ref 38–126)
Anion gap: 10 (ref 5–15)
BUN: 35 mg/dL — ABNORMAL HIGH (ref 8–23)
CO2: 28 mmol/L (ref 22–32)
Calcium: 9.4 mg/dL (ref 8.9–10.3)
Chloride: 102 mmol/L (ref 98–111)
Creatinine, Ser: 0.85 mg/dL (ref 0.44–1.00)
GFR calc Af Amer: >60 mL/min (ref >60)
GFR calc non Af Amer: 59 mL/min — ABNORMAL LOW (ref >60)
Glucose, Bld: 103 mg/dL — ABNORMAL HIGH (ref 70–99)
Potassium: 3.7 mmol/L (ref 3.5–5.1)
Sodium: 140 mmol/L (ref 135–145)
Total Bilirubin: 0.6 mg/dL (ref 0.3–1.2)
Total Protein: 6.7 g/dL (ref 6.5–8.1)

## 2019-09-14 LAB — CBC
HCT: 37.7 % (ref 36.0–46.0)
Hemoglobin: 12.5 g/dL (ref 12.0–15.0)
MCH: 29.6 pg (ref 26.0–34.0)
MCHC: 33.2 g/dL (ref 30.0–36.0)
MCV: 89.3 fL (ref 80.0–100.0)
Platelets: 351 10*3/uL (ref 150–400)
RBC: 4.22 MIL/uL (ref 3.87–5.11)
RDW: 14.5 % (ref 11.5–15.5)
WBC: 7.2 10*3/uL (ref 4.0–10.5)
nRBC: 0 % (ref 0.0–0.2)

## 2019-09-14 LAB — URINALYSIS, COMPLETE (UACMP) WITH MICROSCOPIC
Bilirubin Urine: NEGATIVE
Glucose, UA: NEGATIVE mg/dL
Hgb urine dipstick: NEGATIVE
Ketones, ur: 5 mg/dL — AB
Nitrite: POSITIVE — AB
Protein, ur: NEGATIVE mg/dL
Specific Gravity, Urine: 1.016 (ref 1.005–1.030)
Squamous Epithelial / HPF: NONE SEEN (ref 0–5)
pH: 6 (ref 5.0–8.0)

## 2019-09-14 LAB — LIPASE, BLOOD: Lipase: 36 U/L (ref 11–51)

## 2019-09-14 MED ORDER — ONDANSETRON HCL 4 MG/2ML IJ SOLN
4.0000 mg | Freq: Once | INTRAMUSCULAR | Status: AC
  Administered 2019-09-14: 16:00:00 4 mg via INTRAVENOUS
  Filled 2019-09-14: qty 2

## 2019-09-14 MED ORDER — OXYCODONE HCL 5 MG PO TABS
2.5000 mg | ORAL_TABLET | Freq: Once | ORAL | Status: DC
  Filled 2019-09-14: qty 1

## 2019-09-14 MED ORDER — SODIUM CHLORIDE 0.9 % IV BOLUS
1000.0000 mL | Freq: Once | INTRAVENOUS | Status: AC
  Administered 2019-09-14: 1000 mL via INTRAVENOUS

## 2019-09-14 MED ORDER — SODIUM CHLORIDE 0.9 % IV SOLN
1.0000 g | Freq: Once | INTRAVENOUS | Status: AC
  Administered 2019-09-14: 17:00:00 1 g via INTRAVENOUS
  Filled 2019-09-14: qty 10

## 2019-09-14 MED ORDER — CEPHALEXIN 500 MG PO CAPS: 500.0000 mg | ORAL_CAPSULE | Freq: Two times a day (BID) | ORAL | 0 refills | Status: AC

## 2019-09-14 MED ORDER — IOHEXOL 300 MG/ML  SOLN
75.0000 mL | Freq: Once | INTRAMUSCULAR | Status: AC | PRN
  Administered 2019-09-14: 75 mL via INTRAVENOUS

## 2019-09-14 MED ORDER — ACETAMINOPHEN 500 MG PO TABS
1000.0000 mg | ORAL_TABLET | Freq: Once | ORAL | Status: DC
  Filled 2019-09-14: qty 2

## 2019-09-14 MED ORDER — SODIUM CHLORIDE 0.9% FLUSH: 3.0000 mL | Freq: Once | INTRAVENOUS | Status: DC

## 2019-09-14 NOTE — ED Notes (Signed)
Pt transported to CT ?

## 2019-09-14 NOTE — ED Notes (Signed)
Pt c/o bilateral upper and lower abdominal pain, pt c/o blurry vision worse when she tries to sit up straight. Pt c/o L chest tightness since she has been in ED. Pt also c/o bilateral knee pain but has been unable to get knee replacements since covid. Pt c/o dizziness/lightheadedness with position changes. Pt states gradual worsening over the last few months and states "I just need help".

## 2019-09-14 NOTE — ED Notes (Signed)
In and out performed by this RN and Engineer, site, Therapist, sports. Pt tolerated well. UA sent to lab at this time.

## 2019-09-14 NOTE — ED Notes (Signed)
Purewick placed on patient due to patient complaints of needing to urinate. Pt states, well don't I need something to drink? This RN explained the purpose and use of Purewick, pt states understanding. Call bell remains within reach, abx initiated per MD order at this time.

## 2019-09-14 NOTE — ED Provider Notes (Signed)
CT scan negative for acute abnormalities. Patient stating she is not in any pain currently and declining pain medication.   UA positive for infection, will plan for dose of antibiotics here and then plan for d/c with Rx for abx.    CT A/P: IMPRESSION:  1. Sigmoid diverticulosis without inflammation.  2. Aortic atherosclerosis.  3. No acute abnormality seen in the abdomen or pelvis.    Lilia Pro., MD 09/14/19 [phone]

## 2019-09-14 NOTE — ED Notes (Signed)
NAD noted at time of D/C. Pt denies questions or concerns. Pt taken to the lobby via wheelchair at this time by EDT to be D/C into the care of her daughter.

## 2019-09-14 NOTE — ED Notes (Signed)
Initially IV placed to L AC, pt then states to [place], RN that patient cannot have BP on L arm despite having BP cuff on arm since arriving in room. This RN and Me[place], RN conferred with EDP regarding IV placement, IV removed and placed to R AC. Teaching done with patient regarding importance of notifying staff regarding no BP/IV to L arm.

## 2019-09-14 NOTE — Discharge Instructions (Addendum)
Thank you for letting us take care of you in the emergency department today.   Please continue to take any regular, prescribed medications.   New medications we have prescribed:  - Keflex - an antibiotic to treat you urine infection  Please follow up with: - Your primary care doctor to review your ER visit and follow up on your symptoms.   Please return to the ER for any new or worsening symptoms.

## 2019-09-14 NOTE — ED Provider Notes (Addendum)
[facility] Emergency Department Provider Note  ____________________________________________   First MD Initiated Contact with Patient 09/14/19 1419     (approximate)  I have reviewed the triage vital signs and the nursing notes.   HISTORY  Chief Complaint Abdominal Pain (states pain in bilateral upper quad about 2 hours after eating) and Constipation    HPI Sue Knapp is a 86 y.o. female with past medical history of hypertension, hyperlipidemia, chronic severe arthritis with chronic pain, here with multiple complaints.  Patient is a somewhat tangential historian, but states that her primary issue today is her abdominal pain.  She reports that for the last 1 to 2 weeks, she has had progressively worsening generalized abdominal pain and distention.  She has a history of chronic constipation and feels like it could be related to this.  She did increase her laxative use starting yesterday, and has had 4 loose bowel movements since then.  This has not improved her distention.  The pain is aching, gnawing, and she feels like she has increased bowel activity in her abdomen.  She also feels some swelling in her mid abdomen when she sits up or strains.  Denies known history of hernia.  No nausea or vomiting.  Her appetite has been normal.  No urinary symptoms.  No fevers or chills.  Patient also has several chronic complaints.  She complains of blurred vision in the mornings, but this generally improves when she wears her glasses.  No focal changes in her vision or double vision.  No headaches.  She also complains of bilateral, severe knee pain.  This pain is aching, gnawing, constant, and worse with weightbearing.  She has been seen by the pain management clinic, and was previously prescribed tramadol for this, which reportedly made her mildly confused.  Of note, she had a similar reaction to codeine in the past.  No joint swelling or warmth.        Past Medical  History:  Diagnosis Date  . Arthritis   . Breast cancer ([place]) 2013   left breast, radiation and lumpectomy  . Cataract   . Depression   . Fatigue   . GERD (gastroesophageal reflux disease)   . Glaucoma   . Heart murmur   . Heart palpitations   . Hiatal hernia   . HOH (hard of hearing)    AIDS  . Hyperlipidemia   . Hypertension   . Hypothyroidism   . Left ankle injury    Old left ankle injury  . Left bundle branch block   . Neuropathy   . Osteoporosis   . Pacemaker   . Palpitations   . Personal history of radiation therapy 2013   left breast ca  . Scoliosis   . Shortness of breath dyspnea   . Thyroid nodule   . TIA (transient ischemic attack)     Patient Active Problem List   Diagnosis Date Noted  . Primary osteoarthritis of right knee 03/03/2019  . Primary osteoarthritis of left knee 03/03/2019  . Bilateral primary osteoarthritis of knee 03/03/2019  . Osteoarthritis of right shoulder 03/03/2019  . Chronic pain syndrome 03/03/2019  . Cancer of central portion of left breast ([place]) 02/02/2015    Past Surgical History:  Procedure Laterality Date  . ABDOMINAL HYSTERECTOMY    . BREAST BIOPSY Left 2005   bengin  . BREAST BIOPSY Left 2013   stereo bx, invasive tubular carcinoma and DCIS  . BREAST LUMPECTOMY Left 02/21/2012   invasive  tubular carcinoma and DCIS, clear margins, negative LN  . BREAST SURGERY     LUMPECTOMY  . CATARACT EXTRACTION W/PHACO Right 03/01/2015   Procedure: CATARACT EXTRACTION PHACO AND INTRAOCULAR LENS PLACEMENT (IOC);  Surgeon: Birder Robson, MD;  Location: [facility] ORS;  Service: Ophthalmology;  Laterality: Right;  US:1:03.1 AP:22.8 CDE:14.37  Fluid lot # [id number]  . CATARACT EXTRACTION W/PHACO Left 03/22/2015   Procedure: CATARACT EXTRACTION PHACO AND INTRAOCULAR LENS PLACEMENT (IOC);  Surgeon: Birder Robson, MD;  Location: [facility] ORS;  Service: Ophthalmology;  Laterality: Left;  [place]: 01:01.0 AP%: 22.5% CDE: 13.75 Lot # [id number]  .  MASTECTOMY PARTIAL / LUMPECTOMY    . THYROIDECTOMY, PARTIAL      Prior to Admission medications   Medication Sig Start Date End Date Taking? Authorizing Provider  acetaminophen (TYLENOL) 650 MG CR tablet Take 1,300 mg by mouth 2 (two) times daily.    [provider]  alendronate (FOSAMAX) 70 MG tablet TAKE 1 TABLET ONCE A WEEK Patient taking differently: Take 70 mg by mouth once a week.  06/17/17   Lloyd Huger, MD  ALPRAZolam Duanne Moron) 0.25 MG tablet Take 0.25 mg by mouth at bedtime.    [provider]  aspirin EC 81 MG tablet Take 81 mg by mouth every 7 (seven) days.     [provider]  calcium-vitamin D (OSCAL WITH D) 500-200 MG-UNIT tablet Take 1 tablet by mouth daily with breakfast.    [provider]  Cholecalciferol 50 MCG (2000 UT) TABS Take 2,000 Units by mouth daily.     [provider]  Cyanocobalamin (VITAMIN B-12 PO) Take 2,500 mcg by mouth daily.     [provider]  diclofenac sodium (VOLTAREN) 1 % GEL Apply topically 4 (four) times daily.    [provider]  ferrous sulfate 325 (65 FE) MG tablet Take 325 mg by mouth 2 (two) times a week.    [provider]  gabapentin (NEURONTIN) 100 MG capsule Take 100 mg by mouth 3 (three) times daily.    [provider]  hydrochlorothiazide (HYDRODIURIL) 25 MG tablet Take 25 mg by mouth daily.    [provider]  ibuprofen (ADVIL,MOTRIN) 200 MG tablet Take 200 mg by mouth 2 (two) times daily.    [provider]  latanoprost (XALATAN) 0.005 % ophthalmic solution Place 1 drop into both eyes at bedtime.    [provider]  letrozole [facility]) 2.5 MG tablet TAKE 1 TABLET DAILY 03/14/17   Lloyd Huger, MD  losartan (COZAAR) 100 MG tablet Take 100 mg by mouth daily.    [provider]  metoprolol succinate (TOPROL-XL) 50 MG 24 hr tablet Take 50 mg by mouth daily.  04/14/18   [provider]  Multiple Vitamin  (MULTIVITAMIN WITH MINERALS) TABS tablet Take 1 tablet by mouth daily.    [provider]  vitamin C (ASCORBIC ACID) 500 MG tablet Take 500 mg by mouth daily.    [provider]    Allergies Codeine, Etodolac, and Zolpidem  Family History  Problem Relation Age of Onset  . Breast cancer Neg Hx     Social History Social History   Tobacco Use  . Smoking status: Never Smoker  . Smokeless tobacco: Never Used  Substance Use Topics  . Alcohol use: No  . Drug use: No    Review of Systems  Review of Systems  Constitutional: Positive for fatigue. Negative for fever.  HENT: Negative for congestion and sore throat.  Eyes: Positive for visual disturbance.  Respiratory: Negative for cough and shortness of breath.   Cardiovascular: Negative for chest pain.  Gastrointestinal: Positive for abdominal distention, abdominal pain and constipation. Negative for diarrhea, nausea and vomiting.  Genitourinary: Negative for flank pain.  Musculoskeletal: Positive for arthralgias and myalgias. Negative for back pain and neck pain.  Skin: Negative for rash and wound.  Neurological: Positive for weakness and light-headedness.  All other systems reviewed and are negative.    ____________________________________________  PHYSICAL EXAM:      VITAL SIGNS: ED Triage Vitals  Enc Vitals Group     BP 09/14/19 1231 (!) 113/37     Pulse Rate 09/14/19 1231 68     Resp 09/14/19 1231 18     Temp 09/14/19 1231 98.4 F (36.9 C)     Temp Source 09/14/19 1231 Oral     SpO2 09/14/19 1231 100 %     Weight 09/14/19 1240 134 lb (60.8 kg)     Height 09/14/19 1240 5' (1.524 m)     Head Circumference --      Peak Flow --      Pain Score --      Pain Loc --      Pain Edu? --      Excl. in [place]? --      Physical Exam Vitals and nursing note reviewed.  Constitutional:      General: She is not in acute distress.    Appearance: She is well-developed.  HENT:     Head: Normocephalic and  atraumatic.  Eyes:     Conjunctiva/sclera: Conjunctivae normal.  Cardiovascular:     Rate and Rhythm: Normal rate and regular rhythm.     Heart sounds: Normal heart sounds.  Pulmonary:     Effort: Pulmonary effort is normal. No respiratory distress.     Breath sounds: No wheezing.  Abdominal:     General: Abdomen is protuberant. Bowel sounds are increased. There is no distension.     Palpations: Abdomen is soft.     Tenderness: There is generalized abdominal tenderness. There is no right CVA tenderness, left CVA tenderness, guarding or rebound.  Musculoskeletal:     Cervical back: Neck supple.     Comments: Moderate degenerative changes noted bilateral knees, with no effusions or warmth.  Skin:    General: Skin is warm.     Capillary Refill: Capillary refill takes less than 2 seconds.     Findings: No rash.  Neurological:     Mental Status: She is alert and oriented to person, place, and time.     Motor: No abnormal muscle tone.       ____________________________________________   LABS (all labs ordered are listed, but only abnormal results are displayed)  Labs Reviewed  COMPREHENSIVE METABOLIC PANEL - Abnormal; Notable for the following components:      Result Value   Glucose, Bld 103 (*)    BUN 35 (*)    GFR calc non Af Amer 59 (*)    All other components within normal limits  LIPASE, BLOOD  CBC  URINALYSIS, COMPLETE (UACMP) WITH MICROSCOPIC    ____________________________________________  EKG: Sinus rhythm with complete heart block and ventricular pacemaking.  Ventricular rate 80.  QRS 158, QTc 516. ________________________________________  RADIOLOGY All imaging, including plain films, CT scans, and ultrasounds, independently reviewed by me, and interpretations confirmed via formal radiology reads.  ED MD interpretation:   Chest x-ray: No acute abnormality CT head: No acute abnormality  Official radiology report(s): DG Chest 2 View  Result Date:  09/14/2019 CLINICAL DATA:  Bilateral upper and lower extremity pain. Diaphoresis, weakness and nervousness. EXAM: CHEST - 2 VIEW COMPARISON:  Single-view of the chest 08/05/2018. FINDINGS: Lungs clear. Heart size normal. Pacing device in place. Atherosclerosis is noted. No pneumothorax or pleural effusion. Severe degenerative change about the shoulders is stable in appearance. IMPRESSION: No acute disease. Electronically Signed   By: Inge Rise M.D.   On: 09/14/2019 13:39   CT Head Wo Contrast  Result Date: 09/14/2019 CLINICAL DATA:  Weakness. Bilateral arm and leg pain. EXAM: CT HEAD WITHOUT CONTRAST TECHNIQUE: Contiguous axial images were obtained from the base of the skull through the vertex without intravenous contrast. COMPARISON:  CT head dated January 27, 2015. FINDINGS: Brain: No evidence of acute infarction, hemorrhage, hydrocephalus, extra-axial collection or mass lesion/mass effect. Stable mild atrophy and chronic microvascular ischemic changes. Vascular: Atherosclerotic vascular calcification of the carotid siphons. No hyperdense vessel. Skull: Normal. Negative for fracture or focal lesion. Sinuses/Orbits: No acute finding. Other: None. IMPRESSION: No acute intracranial abnormality. Electronically Signed   By: Titus Dubin M.D.   On: 09/14/2019 13:32    ____________________________________________  PROCEDURES   Procedure(s) performed (including Critical Care):  Procedures  ____________________________________________  INITIAL IMPRESSION / MDM / [place] / ED COURSE  As part of my medical decision making, I reviewed the following data within the [place] notes reviewed and incorporated, Old chart reviewed, Notes from prior ED visits, and [place] Controlled Substance Database       *ELLENORA BOTTINO was evaluated in Emergency Department on 09/14/2019 for the symptoms described in the history of present illness. She was evaluated in the context  of the global COVID-19 pandemic, which necessitated consideration that the patient might be at risk for infection with the SARS-CoV-2 virus that causes COVID-19. Institutional protocols and algorithms that pertain to the evaluation of patients at risk for COVID-19 are in a state of rapid change based on information released by regulatory bodies including the CDC and federal and state organizations. These policies and algorithms were followed during the patient's care in the ED.  Some ED evaluations and interventions may be delayed as a result of limited staffing during the pandemic.*     Medical Decision Making: 86 year old female here with multiple complaints.  1. Abdominal pain: DDx includes IBS, constipation, diverticulitis/colitis. Unlikely appendicitis, cholecystitis based on exam. Pain is minimal, not associated with eating, and I do not suspect mesenteric ischemia. No CP, SOB, EKG changes or signs to suggest ischemia or referred cardiac etiology. CXR clear, no signs of basilar lung disease. CT scan pending. CBC reassuring, normal WBC. CMP unremarkable with exception of mild elevated BUN:Cr ratio c/w pre-renal dehydration. Start IVF, antiemetics, and CT scan ordered. Plan to f/u CT, UA, symptomatic control.  2. Blurred vision: Chronic, no focal changes, no double vision. CBG normal. No sx to suggest aneurysm or CVA.  3. Leg pain: Secondary but major complaint, likely 2/2 chronic severe OA. No signs of septic or inflammatory arthritis. Will trial low-dose oxycodone given poor rxn to tramadol, but will need good bowel regimen with this given c/o abd pai above. Trial of low-dose oxycodone.  ____________________________________________  FINAL CLINICAL IMPRESSION(S) / ED DIAGNOSES  Final diagnoses:  Generalized abdominal pain  Mild dehydration     MEDICATIONS GIVEN DURING THIS VISIT:  Medications  sodium chloride flush (NS) 0.9 % injection 3 mL (has no administration in time range)  oxyCODONE (Oxy IR/ROXICODONE) immediate release tablet 2.5 mg (has no administration in time range)  ondansetron (ZOFRAN) injection 4 mg (has no administration in time range)  acetaminophen (TYLENOL) tablet 1,000 mg (has no administration in time range)  sodium chloride 0.9 % bolus 1,000 mL (has no administration in time range)     ED Discharge Orders    None       Note:  This document was prepared using Dragon voice recognition software and may include unintentional dictation errors.   Duffy Bruce, MD 09/14/19 1524    Duffy Bruce, MD 09/14/19 [phone]

## 2019-09-14 NOTE — ED Triage Notes (Signed)
Pt here with c/o pain in arms and legs, is weak and tired, feels diaphoretic and nervous, is here for constipation however, did have 2 bm's yesterday and today. Took a laxative 2 nights ago. Is worried about some blurred vision she is having today, has been seen at the pain clinic for bilateral knee pain, has only taken bp meds today, no pain meds. Alert and oriented, anxious in triage. Speech clear.

## 2019-09-14 NOTE — ED Notes (Signed)
EDP at bedside to assess patient.

## 2019-09-15 ENCOUNTER — Telehealth: Payer: Self-pay | Admitting: *Deleted

## 2019-09-15 NOTE — Telephone Encounter (Signed)
CALL RETURNED TO Sue Knapp AND EXPLAINED THAT SHE NEEDS TO DISCUSS FOSAMAX EITH DR MASOUD AND SHE STATES [place] [facility] WILL CONTACT HIM

## 2019-09-15 NOTE — Telephone Encounter (Signed)
Daughter called asking if patient should still be taking Fosamax or should it have stopped in 2018 when she stopped her AI. Please advise

## 2019-09-15 NOTE — Telephone Encounter (Signed)
I have not seen the patient in well over 2 years.  Will defer to her primary care physician for management of her history of osteoporosis.

## 2019-11-26 ENCOUNTER — Ambulatory Visit (INDEPENDENT_AMBULATORY_CARE_PROVIDER_SITE_OTHER): Payer: Medicare Other | Admitting: Internal Medicine

## 2019-11-26 VITALS — BP 155/70 | HR 72

## 2019-11-26 DIAGNOSIS — Z950 Presence of cardiac pacemaker: Secondary | ICD-10-CM

## 2019-11-26 DIAGNOSIS — I495 Sick sinus syndrome: Secondary | ICD-10-CM

## 2019-11-26 NOTE — Progress Notes (Signed)
Established Patient Office Visit  Subjective:  Patient ID: Sue Knapp, female    DOB: 1922/08/30  Age: 86 y.o. MRN: [id number]  CC:  Chief Complaint  Patient presents with  . Pacemaker Check    HPI  Sue Knapp presents for  Pacer check. she is known to have multiple joint arthritis both knee pain both shoulder pain fibromyalgia and has seen neurologist and orthopedic surgeon multiple times  Past Medical History:  Diagnosis Date  . Arthritis   . Breast cancer ([place]) 2013   left breast, radiation and lumpectomy  . Cataract   . Depression   . Fatigue   . GERD (gastroesophageal reflux disease)   . Glaucoma   . Heart murmur   . Heart palpitations   . Hiatal hernia   . HOH (hard of hearing)    AIDS  . Hyperlipidemia   . Hypertension   . Hypothyroidism   . Left ankle injury    Old left ankle injury  . Left bundle branch block   . Neuropathy   . Osteoporosis   . Pacemaker   . Palpitations   . Personal history of radiation therapy 2013   left breast ca  . Scoliosis   . Shortness of breath dyspnea   . Thyroid nodule   . TIA (transient ischemic attack)     Past Surgical History:  Procedure Laterality Date  . ABDOMINAL HYSTERECTOMY    . BREAST BIOPSY Left 2005   bengin  . BREAST BIOPSY Left 2013   stereo bx, invasive tubular carcinoma and DCIS  . 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 BREAST SURGERY     LUMPECTOMY  . CATARACT EXTRACTION W/PHACO Right 03/01/2015   Procedure: CATARACT EXTRACTION PHACO AND INTRAOCULAR LENS PLACEMENT (IOC);  Surgeon: Birder Robson, MD;  Location: [facility] ORS;  Service: Ophthalmology;  Laterality: Right;  US:1:03.1 AP:22.8 CDE:14.37  Fluid lot # [id number]  . CATARACT EXTRACTION W/PHACO Left 03/22/2015   Procedure: CATARACT EXTRACTION PHACO AND INTRAOCULAR LENS PLACEMENT (IOC);  Surgeon: Birder Robson, MD;  Location: [facility] ORS;  Service: Ophthalmology;   Laterality: Left;  [place]: 01:01.0 AP%: 22.5% CDE: 13.75 Lot # [id number]  . MASTECTOMY PARTIAL / LUMPECTOMY    . THYROIDECTOMY, PARTIAL      Family History  Problem Relation Age of Onset  . Breast cancer Neg Hx     Social History   Socioeconomic History  . Marital status: Widowed    Spouse name: Not on file  . Number of children: Not on file  . Years of education: Not on file  . Highest education level: Not on file  Occupational History  . Not on file  Tobacco Use  . Smoking status: Never Smoker  . Smokeless tobacco: Never Used  Substance and Sexual Activity  . Alcohol use: No  . Drug use: No  . Sexual activity: Not on file  Other Topics Concern  . Not on file  Social History Narrative  . Not on file   Social Determinants of Health   Financial Resource Strain:   . Difficulty of Paying Living Expenses:   Food Insecurity:   . Worried About Charity fundraiser in the Last Year:   . Arboriculturist in the Last Year:   Transportation Needs:   . Film/video editor (Medical):   Marland Kitchen Lack of Transportation (Non-Medical):   Physical Activity:   . Days of Exercise per Week:   .  Minutes of Exercise per Session:   Stress:   . Feeling of Stress :   Social Connections:   . Frequency of Communication with Friends and Family:   . Frequency of Social Gatherings with Friends and Family:   . Attends Religious Services:   . Active Member of Clubs or Organizations:   . Attends Archivist Meetings:   Marland Kitchen Marital Status:   Intimate Partner Violence:   . Fear of Current or Ex-Partner:   . Emotionally Abused:   Marland Kitchen Physically Abused:   . Sexually Abused:      Current Outpatient Medications:  .  acetaminophen (TYLENOL) 650 MG CR tablet, Take 1,300 mg by mouth 2 (two) times daily., Disp: , Rfl:  .  alendronate (FOSAMAX) 70 MG tablet, TAKE 1 TABLET ONCE A WEEK (Patient taking differently: Take 70 mg by mouth once a week. ), Disp: 12 tablet, Rfl: 2 .  ALPRAZolam (XANAX) 0.25  MG tablet, Take 0.25 mg by mouth at bedtime., Disp: , Rfl:  .  aspirin EC 81 MG tablet, Take 81 mg by mouth every 7 (seven) days. , Disp: , Rfl:  .  calcium-vitamin D (OSCAL WITH D) 500-200 MG-UNIT tablet, Take 1 tablet by mouth daily with breakfast., Disp: , Rfl:  .  Cholecalciferol 50 MCG (2000 UT) TABS, Take 2,000 Units by mouth daily. , Disp: , Rfl:  .  Cyanocobalamin (VITAMIN B-12 PO), Take 2,500 mcg by mouth daily. , Disp: , Rfl:  .  diclofenac sodium (VOLTAREN) 1 % GEL, Apply topically 4 (four) times daily., Disp: , Rfl:  .  ferrous sulfate 325 (65 FE) MG tablet, Take 325 mg by mouth 2 (two) times a week., Disp: , Rfl:  .  gabapentin (NEURONTIN) 100 MG capsule, Take 100 mg by mouth 3 (three) times daily., Disp: , Rfl:  .  hydrochlorothiazide (HYDRODIURIL) 25 MG tablet, Take 25 mg by mouth daily., Disp: , Rfl:  .  ibuprofen (ADVIL,MOTRIN) 200 MG tablet, Take 200 mg by mouth 2 (two) times daily., Disp: , Rfl:  .  latanoprost (XALATAN) 0.005 % ophthalmic solution, Place 1 drop into both eyes at bedtime., Disp: , Rfl:  .  letrozole ([place]) 2.5 MG tablet, TAKE 1 TABLET DAILY (Patient not taking: Reported on 09/14/2019), Disp: 90 tablet, Rfl: 0 .  losartan (COZAAR) 100 MG tablet, Take 100 mg by mouth daily., Disp: , Rfl:  .  metoprolol succinate (TOPROL-XL) 50 MG 24 hr tablet, Take 50 mg by mouth daily. , Disp: , Rfl:  .  Multiple Vitamin (MULTIVITAMIN WITH MINERALS) TABS tablet, Take 1 tablet by mouth daily., Disp: , Rfl:  .  vitamin C (ASCORBIC ACID) 500 MG tablet, Take 500 mg by mouth daily., Disp: , Rfl:    Allergies  Allergen Reactions  . Codeine Other (See Comments)  . Etodolac Other (See Comments)  . Zolpidem Other (See Comments)    ROS Review of Systems  Constitutional: Positive for fatigue.  HENT: Negative for ear discharge and facial swelling.   Eyes: Negative for itching.  Respiratory: Negative for chest tightness.   Cardiovascular: Negative for chest pain.    Gastrointestinal: Negative for abdominal distention.  Genitourinary: Negative for frequency.  Musculoskeletal: Positive for arthralgias.  Neurological: Positive for dizziness.  Psychiatric/Behavioral: Negative for behavioral problems.      Objective:    Physical Exam  Constitutional: She appears well-developed.  HENT:  Head: Normocephalic.  Eyes: Pupils are equal, round, and reactive to light.  Cardiovascular: Normal rate.  Pulmonary/Chest:  Effort normal.  Abdominal: Soft.  Musculoskeletal:     Cervical back: Normal range of motion.     Comments: arthiritis both knees    BP (!) 155/70   Pulse 72  Wt Readings from Last 3 Encounters:  09/14/19 134 lb (60.8 kg)  05/04/19 136 lb (61.7 kg)  03/23/19 136 lb (61.7 kg)     Health Maintenance Due  Topic Date Due  . COVID-19 Vaccine (1) Never done  . PNA vac Low Risk Adult (2 of 2 - PCV13) 05/10/2016    There are no preventive care reminders to display for this patient.  Lab Results  Component Value Date   TSH 1.84 05/23/2013   Lab Results  Component Value Date   WBC 7.2 09/14/2019   HGB 12.5 09/14/2019   HCT 37.7 09/14/2019   MCV 89.3 09/14/2019   PLT 351 09/14/2019   Lab Results  Component Value Date   NA 140 09/14/2019   K 3.7 09/14/2019   CO2 28 09/14/2019   GLUCOSE 103 (H) 09/14/2019   BUN 35 (H) 09/14/2019   CREATININE 0.85 09/14/2019   BILITOT 0.6 09/14/2019   ALKPHOS 64 09/14/2019   AST 21 09/14/2019   ALT 13 09/14/2019   PROT 6.7 09/14/2019   ALBUMIN 3.8 09/14/2019   CALCIUM 9.4 09/14/2019   ANIONGAP 10 09/14/2019   Lab Results  Component Value Date   CHOL 214 (H) 04/11/2013   Lab Results  Component Value Date   HDL 65 (H) 04/11/2013   Lab Results  Component Value Date   LDLCALC 131 (H) 04/11/2013   Lab Results  Component Value Date   TRIG 89 04/11/2013   No results found for: CHOLHDL No results found for: HGBA1C    Assessment & Plan:   Problem List Items Addressed This Visit     None    Visit Diagnoses    Cardiac pacemaker in situ    -  Primary   Relevant Orders   PACEMAKER IN CLINIC CHECK    Note: Medical Device Follow-up  Patient pacemaker was interrogated by pacemakers analyzer, battery status is okay.  No programming changes were indicated after the review of the data.  Histogram shows no change since the last interrogation Atrial and ventricular sensing thresholds were found to be acceptable Impedance was checked and it was found to be normal.  Thresholds were found to be okay on evaluation of rhythm problem.  No high rate or low rate arrhythmia were noted.  Estimated battery longevity is 36 months. I have personally reviewed the device data and amended the report as necessary.   No orders of the defined types were placed in this encounter.   Follow-up: Return in about 3 months (around 02/26/2020).    Cletis Athens, MD

## 2019-12-10 ENCOUNTER — Ambulatory Visit: Payer: Medicare Other | Admitting: Internal Medicine

## 2019-12-10 ENCOUNTER — Encounter: Payer: Self-pay | Admitting: Internal Medicine

## 2019-12-10 ENCOUNTER — Other Ambulatory Visit: Payer: Self-pay

## 2019-12-10 VITALS — BP 182/84 | HR 68 | Wt 137.3 lb

## 2019-12-10 DIAGNOSIS — M1712 Unilateral primary osteoarthritis, left knee: Secondary | ICD-10-CM

## 2019-12-10 DIAGNOSIS — I10 Essential (primary) hypertension: Secondary | ICD-10-CM | POA: Diagnosis not present

## 2019-12-10 DIAGNOSIS — C50112 Malignant neoplasm of central portion of left female breast: Secondary | ICD-10-CM

## 2019-12-10 DIAGNOSIS — M170 Bilateral primary osteoarthritis of knee: Secondary | ICD-10-CM

## 2019-12-10 DIAGNOSIS — G894 Chronic pain syndrome: Secondary | ICD-10-CM

## 2019-12-10 DIAGNOSIS — Z853 Personal history of malignant neoplasm of breast: Secondary | ICD-10-CM

## 2019-12-10 MED ORDER — PREGABALIN 50 MG PO CAPS: 50.0000 mg | ORAL_CAPSULE | Freq: Three times a day (TID) | ORAL | 0 refills | Status: DC

## 2019-12-10 NOTE — Assessment & Plan Note (Signed)
Ref to pain doctor

## 2019-12-10 NOTE — Assessment & Plan Note (Signed)
stable °

## 2019-12-10 NOTE — Progress Notes (Signed)
Established Patient Office Visit  Subjective:  Patient ID: Sue Knapp, female    DOB: 02-11-1923  Age: 86 y.o. MRN: [id number]  CC:  Chief Complaint  Patient presents with  . Pain    patient would like to discuss medication options that could possible help her be more active.    HPI  [name] presents for for both knee pain he has trouble getting up walking also has a problem with the both shoulder has seen orthopedic surgeon who has injected knee and shoulder with very minimal relief.  She is taking Lyrica 50 mg p.o. twice a day so I told her that we can increase it to three times a day to get her better relief Lyrica will help with the pain as well as it will help her with the neuropathy.  Past Medical History:  Diagnosis Date  . Arthritis   . Breast cancer ([place]) 2013   left breast, radiation and lumpectomy  . Cataract   . Depression   . Fatigue   . GERD (gastroesophageal reflux disease)   . Glaucoma   . Heart murmur   . Heart palpitations   . Hiatal hernia   . HOH (hard of hearing)    AIDS  . Hyperlipidemia   . Hypertension   . Hypothyroidism   . Left ankle injury    Old left ankle injury  . Left bundle branch block   . Neuropathy   . Osteoporosis   . Pacemaker   . Palpitations   . Personal history of radiation therapy 2013   left breast ca  . Scoliosis   . Shortness of breath dyspnea   . Thyroid nodule   . TIA (transient ischemic attack)     Past Surgical History:  Procedure Laterality Date  . ABDOMINAL HYSTERECTOMY    . BREAST BIOPSY Left 2005   bengin  . BREAST BIOPSY Left 2013   stereo bx, invasive tubular carcinoma and DCIS  . 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 BREAST SURGERY     LUMPECTOMY  . CATARACT EXTRACTION W/PHACO Right 03/01/2015   Procedure: CATARACT EXTRACTION PHACO AND INTRAOCULAR LENS PLACEMENT (IOC);  Surgeon: Birder Robson, MD;  Location: [facility] ORS;   Service: Ophthalmology;  Laterality: Right;  US:1:03.1 AP:22.8 CDE:14.37  Fluid lot # [id number]  . CATARACT EXTRACTION W/PHACO Left 03/22/2015   Procedure: CATARACT EXTRACTION PHACO AND INTRAOCULAR LENS PLACEMENT (IOC);  Surgeon: Birder Robson, MD;  Location: [facility] ORS;  Service: Ophthalmology;  Laterality: Left;  [place]: 01:01.0 AP%: 22.5% CDE: 13.75 Lot # [id number]  . MASTECTOMY PARTIAL / LUMPECTOMY    . THYROIDECTOMY, PARTIAL      Family History  Problem Relation Age of Onset  . Breast cancer Neg Hx     Social History   Socioeconomic History  . Marital status: Widowed    Spouse name: Not on file  . Number of children: Not on file  . Years of education: Not on file  . Highest education level: Not on file  Occupational History  . Not on file  Tobacco Use  . Smoking status: Never Smoker  . Smokeless tobacco: Never Used  Substance and Sexual Activity  . Alcohol use: No  . Drug use: No  . Sexual activity: Not on file  Other Topics Concern  . Not on file  Social History Narrative  . Not on file   Social Determinants of Health   Financial  Resource Strain:   . Difficulty of Paying Living Expenses:   Food Insecurity:   . Worried About Charity fundraiser in the Last Year:   . Arboriculturist in the Last Year:   Transportation Needs:   . Film/video editor (Medical):   Marland Kitchen Lack of Transportation (Non-Medical):   Physical Activity:   . Days of Exercise per Week:   . Minutes of Exercise per Session:   Stress:   . Feeling of Stress :   Social Connections:   . Frequency of Communication with Friends and Family:   . Frequency of Social Gatherings with Friends and Family:   . Attends Religious Services:   . Active Member of Clubs or Organizations:   . Attends Archivist Meetings:   Marland Kitchen Marital Status:   Intimate Partner Violence:   . Fear of Current or Ex-Partner:   . Emotionally Abused:   Marland Kitchen Physically Abused:   . Sexually Abused:      Current Outpatient  Medications:  .  pregabalin (LYRICA) 50 MG capsule, Take 1 capsule (50 mg total) by mouth 3 (three) times daily., Disp: 90 capsule, Rfl: 0 .  acetaminophen (TYLENOL) 650 MG CR tablet, Take 1,300 mg by mouth 2 (two) times daily., Disp: , Rfl:  .  alendronate (FOSAMAX) 70 MG tablet, TAKE 1 TABLET ONCE A WEEK (Patient taking differently: Take 70 mg by mouth once a week. ), Disp: 12 tablet, Rfl: 2 .  aspirin EC 81 MG tablet, Take 81 mg by mouth every 7 (seven) days. , Disp: , Rfl:  .  calcium-vitamin D (OSCAL WITH D) 500-200 MG-UNIT tablet, Take 1 tablet by mouth daily with breakfast., Disp: , Rfl:  .  Cholecalciferol 50 MCG (2000 UT) TABS, Take 2,000 Units by mouth daily. , Disp: , Rfl:  .  Cyanocobalamin (VITAMIN B-12 PO), Take 2,500 mcg by mouth daily. , Disp: , Rfl:  .  hydrochlorothiazide (HYDRODIURIL) 25 MG tablet, Take 25 mg by mouth daily., Disp: , Rfl:  .  ibuprofen (ADVIL,MOTRIN) 200 MG tablet, Take 200 mg by mouth 2 (two) times daily., Disp: , Rfl:  .  latanoprost (XALATAN) 0.005 % ophthalmic solution, Place 1 drop into both eyes at bedtime., Disp: , Rfl:  .  letrozole ([place]) 2.5 MG tablet, TAKE 1 TABLET DAILY (Patient not taking: Reported on 09/14/2019), Disp: 90 tablet, Rfl: 0 .  losartan (COZAAR) 100 MG tablet, Take 100 mg by mouth daily., Disp: , Rfl:  .  metoprolol succinate (TOPROL-XL) 50 MG 24 hr tablet, Take 50 mg by mouth daily. , Disp: , Rfl:  .  Multiple Vitamin (MULTIVITAMIN WITH MINERALS) TABS tablet, Take 1 tablet by mouth daily., Disp: , Rfl:  .  vitamin C (ASCORBIC ACID) 500 MG tablet, Take 500 mg by mouth daily., Disp: , Rfl:    Allergies  Allergen Reactions  . Codeine Other (See Comments)  . Etodolac Other (See Comments)  . Zolpidem Other (See Comments)    ROS Review of Systems  Constitutional: Negative.   HENT: Negative.   Eyes: Negative.   Respiratory: Negative.   Cardiovascular: Negative.   Gastrointestinal: Negative.   Genitourinary: Negative.     Musculoskeletal:       Patient has arthralgia of the both shoulders both knees has trouble getting up and doing her chores at the house.  Neurological: Negative.       Objective:    Physical Exam  Constitutional: She appears well-developed.  HENT:  Head: Normocephalic.  Eyes: Pupils are equal, round, and reactive to light.  Cardiovascular: Normal rate.  Abdominal: Soft.  Musculoskeletal:     Cervical back: Normal range of motion.     Comments: Patient has pain in the right knee left knee there is no swelling both shoulder movements are restricted.    BP (!) 182/84 (BP Location: Right Arm, Patient Position: Sitting, Cuff Size: Normal)   Pulse 68   Wt 137 lb 4.8 oz (62.3 kg)   BMI 26.81 kg/m  Wt Readings from Last 3 Encounters:  12/10/19 137 lb 4.8 oz (62.3 kg)  09/14/19 134 lb (60.8 kg)  05/04/19 136 lb (61.7 kg)     Health Maintenance Due  Topic Date Due  . COVID-19 Vaccine (1) Never done  . PNA vac Low Risk Adult (2 of 2 - PCV13) 05/10/2016    There are no preventive care reminders to display for this patient.  Lab Results  Component Value Date   TSH 1.84 05/23/2013   Lab Results  Component Value Date   WBC 7.2 09/14/2019   HGB 12.5 09/14/2019   HCT 37.7 09/14/2019   MCV 89.3 09/14/2019   PLT 351 09/14/2019   Lab Results  Component Value Date   NA 140 09/14/2019   K 3.7 09/14/2019   CO2 28 09/14/2019   GLUCOSE 103 (H) 09/14/2019   BUN 35 (H) 09/14/2019   CREATININE 0.85 09/14/2019   BILITOT 0.6 09/14/2019   ALKPHOS 64 09/14/2019   AST 21 09/14/2019   ALT 13 09/14/2019   PROT 6.7 09/14/2019   ALBUMIN 3.8 09/14/2019   CALCIUM 9.4 09/14/2019   ANIONGAP 10 09/14/2019   Lab Results  Component Value Date   CHOL 214 (H) 04/11/2013   Lab Results  Component Value Date   HDL 65 (H) 04/11/2013   Lab Results  Component Value Date   LDLCALC 131 (H) 04/11/2013   Lab Results  Component Value Date   TRIG 89 04/11/2013   No results found for:  CHOLHDL No results found for: HGBA1C    Assessment & Plan:   Problem List Items Addressed This Visit      Cardiovascular and Mediastinum   Essential hypertension    stable        Musculoskeletal and Integument   Primary osteoarthritis of left knee   Bilateral primary osteoarthritis of knee    Ref to pain doctor        Other   Cancer of central portion of left breast (HCC)   Chronic pain syndrome - Primary   Relevant Medications   pregabalin (LYRICA) 50 MG capsule      Meds ordered this encounter  Medications  . pregabalin (LYRICA) 50 MG capsule    Sig: Take 1 capsule (50 mg total) by mouth 3 (three) times daily.    Dispense:  90 capsule    Refill:  0   1. Essential hypertension Elevated due to stress.  2. Primary osteoarthritis of left knee Chronic problem patient with started on Lyrica   3. Chronic pain syndrome To the pain specialist. - pregabalin (LYRICA) 50 MG capsule; Take 1 capsule (50 mg total) by mouth 3 (three) times daily.  Dispense: 90 capsule; Refill: 0  4. Cancer of central portion of left breast (HCC) Stable.  5. Bilateral primary osteoarthritis of knee Chronic problem. Follow-up: No follow-ups on file.    Cletis Athens, MD

## 2019-12-11 DIAGNOSIS — Z853 Personal history of malignant neoplasm of breast: Secondary | ICD-10-CM | POA: Insufficient documentation

## 2019-12-14 ENCOUNTER — Other Ambulatory Visit: Payer: Self-pay | Admitting: Internal Medicine

## 2019-12-17 ENCOUNTER — Other Ambulatory Visit: Payer: Self-pay | Admitting: *Deleted

## 2019-12-17 DIAGNOSIS — G894 Chronic pain syndrome: Secondary | ICD-10-CM

## 2019-12-17 MED ORDER — PREGABALIN 50 MG PO CAPS: 50.0000 mg | ORAL_CAPSULE | Freq: Three times a day (TID) | ORAL | 0 refills | Status: DC

## 2019-12-17 NOTE — Telephone Encounter (Signed)
Patient was seen on 12/10/2019 and states that her lyrica was increase to TID but patient's active rx was BID so now patient is running out of medication. Patient is asking if [facility]

## 2019-12-17 NOTE — Telephone Encounter (Signed)
Lyrica prescription was sent

## 2020-02-08 ENCOUNTER — Encounter: Payer: Self-pay | Admitting: Internal Medicine

## 2020-02-08 ENCOUNTER — Other Ambulatory Visit: Payer: Self-pay

## 2020-02-08 ENCOUNTER — Ambulatory Visit (INDEPENDENT_AMBULATORY_CARE_PROVIDER_SITE_OTHER): Payer: Medicare Other | Admitting: Internal Medicine

## 2020-02-08 VITALS — BP 150/70 | HR 87 | Ht 59.0 in | Wt 139.3 lb

## 2020-02-08 DIAGNOSIS — M170 Bilateral primary osteoarthritis of knee: Secondary | ICD-10-CM

## 2020-02-08 DIAGNOSIS — I10 Essential (primary) hypertension: Secondary | ICD-10-CM

## 2020-02-08 DIAGNOSIS — Z1231 Encounter for screening mammogram for malignant neoplasm of breast: Secondary | ICD-10-CM

## 2020-02-08 DIAGNOSIS — M12811 Other specific arthropathies, not elsewhere classified, right shoulder: Secondary | ICD-10-CM | POA: Diagnosis not present

## 2020-02-08 DIAGNOSIS — G894 Chronic pain syndrome: Secondary | ICD-10-CM

## 2020-02-08 NOTE — Progress Notes (Signed)
Established Patient Office Visit  SUBJECTIVE:  Subjective  Patient ID: Sue Knapp, female    DOB: May 31, 1923  Age: 86 y.o. MRN: [id number]  CC:  Chief Complaint  Patient presents with  . Pain    patient here today for 2 month fu and would like to discuss options to help manage her pain    HPI Sue Knapp is a 86 y.o. female presenting today for discussion regarding her pain management.  She notes that she has continued to have significant pain in her shoulders and knees. She has been taking 1/2 tramadol before bed to help her sleep, which has been helping her leg pain. She is currently wearing a stabilizing knee brace, which she says helps a little, but she does still have some pain. She says she has difficulty pulling the straps tight enough. She also has difficulty dressing herself due to the pain in her shoulders, especially when it involves pulling something over her head or pulling up her pants. She does have a helper that comes in in the afternoon everyday during the week to help her bathe and do some household chores.   She states that she has had to give up sewing, reading, and gardening. She states that she sleeps ok, generally about 5-6 hours a night.  She does also note that her vision has started to decrease as well. Having to give these things up has become upsetting for her as she is afraid to   She last saw Dr. Holley Raring for pain management on 06/22/2019. He recommended maintaining her aspirin EC, latanoprost, letrozole, losartan, Cholecalciferol, Cyanocobalamin (VITAMIN B-12 PO), alendronate, gabapentin, hydrochlorothiazide, metoprolol succinate, ALPRAZolam, acetaminophen, ibuprofen, multivitamin with minerals, calcium-vitamin D, vitamin C, ferrous sulfate, and diclofenac sodium. He also discontinued her Tramadol given cognitive or GI side effects, and did not recommend additional opioid therapy.   She was referred out to Dr. Candelaria Stagers at [facility]. She was  last given an injection to the right intra-articular knee joint aspiration and the left glenohumaral on 11/27/2019.   Past Medical History:  Diagnosis Date  . Arthritis   . Breast cancer ([place]) 2013   left breast, radiation and lumpectomy  . Cataract   . Depression   . Fatigue   . GERD (gastroesophageal reflux disease)   . Glaucoma   . Heart murmur   . Heart palpitations   . Hiatal hernia   . HOH (hard of hearing)    AIDS  . Hyperlipidemia   . Hypertension   . Hypothyroidism   . Left ankle injury    Old left ankle injury  . Left bundle branch block   . Neuropathy   . Osteoporosis   . Pacemaker   . Palpitations   . Personal history of radiation therapy 2013   left breast ca  . Scoliosis   . Shortness of breath dyspnea   . Thyroid nodule   . TIA (transient ischemic attack)     Past Surgical History:  Procedure Laterality Date  . ABDOMINAL HYSTERECTOMY    . BREAST BIOPSY Left 2005   bengin  . BREAST BIOPSY Left 2013   stereo bx, invasive tubular carcinoma and DCIS  . 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 BREAST SURGERY     LUMPECTOMY  . CATARACT EXTRACTION W/PHACO Right 03/01/2015   Procedure: CATARACT EXTRACTION PHACO AND INTRAOCULAR LENS PLACEMENT (IOC);  Surgeon: Birder Robson, MD;  Location: [facility] ORS;  Service: Ophthalmology;  Laterality: Right;  US:1:03.1 AP:22.8 CDE:14.37  Fluid lot # [id number]  . CATARACT EXTRACTION W/PHACO Left 03/22/2015   Procedure: CATARACT EXTRACTION PHACO AND INTRAOCULAR LENS PLACEMENT (IOC);  Surgeon: Birder Robson, MD;  Location: [facility] ORS;  Service: Ophthalmology;  Laterality: Left;  [place]: 01:01.0 AP%: 22.5% CDE: 13.75 Lot # [id number]  . MASTECTOMY PARTIAL / LUMPECTOMY    . THYROIDECTOMY, PARTIAL      Family History  Problem Relation Age of Onset  . Breast cancer Neg Hx     Social History   Socioeconomic History  . Marital status: Widowed    Spouse name: Not  on file  . Number of children: Not on file  . Years of education: Not on file  . Highest education level: Not on file  Occupational History  . Not on file  Tobacco Use  . Smoking status: Never Smoker  . Smokeless tobacco: Never Used  Substance and Sexual Activity  . Alcohol use: No  . Drug use: No  . Sexual activity: Not on file  Other Topics Concern  . Not on file  Social History Narrative  . Not on file   Social Determinants of Health   Financial Resource Strain:   . Difficulty of Paying Living Expenses:   Food Insecurity:   . Worried About Charity fundraiser in the Last Year:   . Arboriculturist in the Last Year:   Transportation Needs:   . Film/video editor (Medical):   Marland Kitchen Lack of Transportation (Non-Medical):   Physical Activity:   . Days of Exercise per Week:   . Minutes of Exercise per Session:   Stress:   . Feeling of Stress :   Social Connections:   . Frequency of Communication with Friends and Family:   . Frequency of Social Gatherings with Friends and Family:   . Attends Religious Services:   . Active Member of Clubs or Organizations:   . Attends Archivist Meetings:   Marland Kitchen Marital Status:   Intimate Partner Violence:   . Fear of Current or Ex-Partner:   . Emotionally Abused:   Marland Kitchen Physically Abused:   . Sexually Abused:      Current Outpatient Medications:  .  traMADol (ULTRAM) 50 MG tablet, Take 1 tablet by mouth daily. Patient takes 1/2 tablet at bedtime, Disp: , Rfl:  .  acetaminophen (TYLENOL) 650 MG CR tablet, Take 1,300 mg by mouth 2 (two) times daily., Disp: , Rfl:  .  alendronate (FOSAMAX) 70 MG tablet, TAKE 1 TABLET ONCE A WEEK (Patient taking differently: Take 70 mg by mouth once a week. ), Disp: 12 tablet, Rfl: 2 .  aspirin EC 81 MG tablet, Take 81 mg by mouth every 7 (seven) days. , Disp: , Rfl:  .  calcium-vitamin D (OSCAL WITH D) 500-200 MG-UNIT tablet, Take 1 tablet by mouth daily with breakfast., Disp: , Rfl:  .   Cholecalciferol 50 MCG (2000 UT) TABS, Take 2,000 Units by mouth daily. , Disp: , Rfl:  .  Cyanocobalamin (VITAMIN B-12 PO), Take 2,500 mcg by mouth daily. , Disp: , Rfl:  .  hydrochlorothiazide (HYDRODIURIL) 25 MG tablet, Take 25 mg by mouth daily., Disp: , Rfl:  .  ibuprofen (ADVIL,MOTRIN) 200 MG tablet, Take 200 mg by mouth 2 (two) times daily., Disp: , Rfl:  .  latanoprost (XALATAN) 0.005 % ophthalmic solution, Place 1 drop into both eyes at bedtime., Disp: , Rfl:  .  letrozole ([employer]) 2.5  MG tablet, TAKE 1 TABLET DAILY (Patient not taking: Reported on 09/14/2019), Disp: 90 tablet, Rfl: 0 .  losartan (COZAAR) 100 MG tablet, Take 100 mg by mouth daily., Disp: , Rfl:  .  metoprolol succinate (TOPROL-XL) 50 MG 24 hr tablet, TAKE ONE TABLET EVERY DAY, Disp: 30 tablet, Rfl: 6 .  Multiple Vitamin (MULTIVITAMIN WITH MINERALS) TABS tablet, Take 1 tablet by mouth daily., Disp: , Rfl:  .  pregabalin (LYRICA) 50 MG capsule, Take 1 capsule (50 mg total) by mouth 3 (three) times daily., Disp: 90 capsule, Rfl: 0 .  vitamin C (ASCORBIC ACID) 500 MG tablet, Take 500 mg by mouth daily., Disp: , Rfl:    Allergies  Allergen Reactions  . Codeine Other (See Comments)  . Etodolac Other (See Comments)  . Zolpidem Other (See Comments)    ROS Review of Systems  Constitutional: Negative.   HENT: Negative.   Eyes: Positive for visual disturbance (vision blurring).  Respiratory: Negative.   Cardiovascular: Negative.   Gastrointestinal: Negative.   Endocrine: Negative.   Genitourinary: Negative.   Musculoskeletal: Positive for arthralgias (knees and shoulders), back pain and myalgias.  Skin: Negative.   Allergic/Immunologic: Negative.   Neurological: Negative.   Hematological: Negative.   Psychiatric/Behavioral: Negative.   All other systems reviewed and are negative.    OBJECTIVE:    Physical Exam Vitals reviewed.  Constitutional:      Appearance: Normal appearance.  HENT:     Mouth/Throat:      Mouth: Mucous membranes are moist.  Eyes:     Pupils: Pupils are equal, round, and reactive to light.  Cardiovascular:     Rate and Rhythm: Normal rate and regular rhythm.     Pulses: Normal pulses.     Heart sounds: Normal heart sounds.  Pulmonary:     Effort: Pulmonary effort is normal.     Breath sounds: Normal breath sounds.  Abdominal:     Palpations: There is no hepatomegaly, splenomegaly or mass.     Tenderness: There is no abdominal tenderness.  Musculoskeletal:     Right lower leg: No edema.     Left lower leg: No edema.  Neurological:     Mental Status: She is alert and oriented to person, place, and time.  Psychiatric:        Mood and Affect: Mood and affect normal.        Behavior: Behavior normal.     BP (!) 150/70   Pulse 87   Ht 4\' 11"  (1.499 m)   Wt 139 lb 4.8 oz (63.2 kg)   BMI 28.14 kg/m  Wt Readings from Last 3 Encounters:  02/08/20 139 lb 4.8 oz (63.2 kg)  12/10/19 137 lb 4.8 oz (62.3 kg)  09/14/19 134 lb (60.8 kg)    Health Maintenance Due  Topic Date Due  . COVID-19 Vaccine (1) Never done  . PNA vac Low Risk Adult (2 of 2 - PCV13) 05/10/2016    There are no preventive care reminders to display for this patient.  CBC Latest Ref Rng & Units 09/14/2019 06/09/2013 06/04/2013  WBC 4.0 - 10.5 K/uL 7.2 7.4 8.2  Hemoglobin 12.0 - 15.0 g/dL 12.5 11.4(L) 11.4(L)  Hematocrit 36 - 46 % 37.7 33.7(L) 33.3(L)  Platelets 150 - 400 K/uL 351 406 345   CMP Latest Ref Rng & Units 09/14/2019 06/09/2013 06/04/2013  Glucose 70 - 99 mg/dL 103(H) 135(H) 118(H)  BUN 8 - 23 mg/dL 35(H) 23(H) 21(H)  Creatinine 0.44 - 1.00 mg/dL  0.85 0.99 0.98  Sodium 135 - 145 mmol/L 140 138 136  Potassium 3.5 - 5.1 mmol/L 3.7 3.9 3.9  Chloride 98 - 111 mmol/L 102 105 101  CO2 22 - 32 mmol/L 28 28 29   Calcium 8.9 - 10.3 mg/dL 9.4 9.0 8.7  Total Protein 6.5 - 8.1 g/dL 6.7 - -  Total Bilirubin 0.3 - 1.2 mg/dL 0.6 - -  Alkaline Phos 38 - 126 U/L 64 - -  AST 15 - 41 U/L 21 - -  ALT  0 - 44 U/L 13 - -    Lab Results  Component Value Date   TSH 1.84 05/23/2013   Lab Results  Component Value Date   ALBUMIN 3.8 09/14/2019   ANIONGAP 10 09/14/2019   Lab Results  Component Value Date   CHOL 214 (H) 04/11/2013   HDL 65 (H) 04/11/2013   LDLCALC 131 (H) 04/11/2013   Lab Results  Component Value Date   TRIG 89 04/11/2013   No results found for: HGBA1C    ASSESSMENT & PLAN:   Problem List Items Addressed This Visit      Cardiovascular and Mediastinum   Essential hypertension     - I encouraged the patient to eat a low-sodium diet to help control blood pressure. - I encouraged the patient to live an active lifestyle and complete activities that increases heart rate to 85% target heart rate at least 5 times per week for one hour.           Musculoskeletal and Integument   Bilateral primary osteoarthritis of knee    Right knee left knee pain is persistent.  Element of fibromyalgia and anxiety is major factor. Pain  Specialistand orthopedic surgeon who does not think itis feasible to do knee replacement because of her age and higher risk.  she has problems with both shoulders.      Relevant Medications   traMADol (ULTRAM) 50 MG tablet     Other   Chronic pain syndrome - Primary    I am referring her to the pain specialist for pain relief measures .pt is c/o more pain  In rt shoulder      Relevant Medications   traMADol (ULTRAM) 50 MG tablet   Encounter for screening mammogram for malignant neoplasm of breast    We will schedule the patient for the mammogram.      Relevant Orders   MM 3D SCREEN BREAST BILATERAL      No orders of the defined types were placed in this encounter.  Joint Injection/Arthrocentesis  Date/Time: 02/08/2020 6:47 PM Performed by: Cletis Athens, MD Authorized by: Cletis Athens, MD  Indications: joint swelling and pain  Body area: shoulder Joint: right shoulder Local anesthesia used: yes  Anesthesia: Local  anesthesia used: yes Local Anesthetic: lidocaine 1% without epinephrine  Sedation: Patient sedated: no  Needle size: 22 G Ultrasound guidance: no Approach: posterior Aspirate: clear Betamethasone amount: 40 mg Bupivacaine 0.25% amount: 1 mL Lidocaine 1% amount: 1 mL Patient tolerance: patient tolerated the procedure well with no immediate complications Comments: Right shoulder joint was prepared with alcohol.  None after injecting 1% local anesthesia of lidocaine right shoulder joint was injected with 40 mg of Kenalog and Marcaine.  Patient tolerated the procedure well and sent to the procedure room in stable condition     Follow-up: Return in about 3 months (around 05/10/2020).    Dr. Jane Canary [facility] [address], [place], [place] [address]  By signing my name below, I, [employer], attest that this documentation has been prepared under the direction and in the presence of Cletis Athens, MD. Electronically Signed: Cletis Athens, MD 02/10/20, 6:53 PM  I personally performed the services described in this documentation, which was SCRIBED in my presence. The recorded information has been reviewed and considered accurate. It has been edited as necessary during review. Cletis Athens, MD

## 2020-02-10 ENCOUNTER — Encounter: Payer: Self-pay | Admitting: Internal Medicine

## 2020-02-10 DIAGNOSIS — Z0000 Encounter for general adult medical examination without abnormal findings: Secondary | ICD-10-CM | POA: Insufficient documentation

## 2020-02-10 NOTE — Assessment & Plan Note (Signed)
I am referring her to the pain specialist for pain relief measures.

## 2020-02-10 NOTE — Assessment & Plan Note (Signed)
.  -   I encouraged the patient to eat a low-sodium diet to help control blood pressure. - I encouraged the patient to live an active lifestyle and complete activities that increases heart rate to 85% target heart rate at least 5 times per week for one hour.     

## 2020-02-10 NOTE — Assessment & Plan Note (Signed)
We will schedule the patient for the mammogram.

## 2020-02-10 NOTE — Assessment & Plan Note (Addendum)
Right knee left knee pain is persistent.  Element of fibromyalgia and anxiety is major factor. Pain  Specialistand orthopedic surgeon who does not think itis feasible to do knee replacement because of her age and higher risk.  she has problems with both shoulders.

## 2020-02-11 ENCOUNTER — Other Ambulatory Visit: Payer: Self-pay | Admitting: Internal Medicine

## 2020-02-17 ENCOUNTER — Other Ambulatory Visit: Payer: Self-pay | Admitting: Internal Medicine

## 2020-02-17 DIAGNOSIS — G894 Chronic pain syndrome: Secondary | ICD-10-CM

## 2020-02-25 ENCOUNTER — Ambulatory Visit (INDEPENDENT_AMBULATORY_CARE_PROVIDER_SITE_OTHER): Payer: Medicare Other | Admitting: Internal Medicine

## 2020-02-25 ENCOUNTER — Other Ambulatory Visit: Payer: Self-pay

## 2020-02-25 VITALS — BP 139/70 | HR 77

## 2020-02-25 DIAGNOSIS — I10 Essential (primary) hypertension: Secondary | ICD-10-CM | POA: Diagnosis not present

## 2020-02-25 DIAGNOSIS — Z950 Presence of cardiac pacemaker: Secondary | ICD-10-CM

## 2020-02-25 DIAGNOSIS — M12812 Other specific arthropathies, not elsewhere classified, left shoulder: Secondary | ICD-10-CM

## 2020-02-25 DIAGNOSIS — I495 Sick sinus syndrome: Secondary | ICD-10-CM

## 2020-02-25 MED ORDER — APIXABAN 2.5 MG PO TABS: 2.5000 mg | ORAL_TABLET | Freq: Two times a day (BID) | ORAL | 6 refills | Status: DC

## 2020-02-25 NOTE — Progress Notes (Signed)
Established Patient Office Visit  SUBJECTIVE:  Subjective  Patient ID: Sue Knapp, female    DOB: [birth year]  Age: 86 y.o. MRN: [id number]  CC:  Chief Complaint  Patient presents with  . Pacemaker Check    HPI Sue Knapp is a 86 y.o. female presenting today for a pacemaker check.   Patient pacemaker was interrogated by pacemakers analyzer, battery status is okay.  No programming changes were indicated after the review of the data.  Histogram shows no change since the last interrogation Atrial and ventricular sensing thresholds were found to be acceptable Impedance was checked and it was found to be normal.  Thresholds were found to be okay on evaluation of rhythm problem. Patient has a short run of atrial afibrilation.  Estimated battery longevity is 2.5 years. I have personally reviewed the device data and amended the report as necessary.   She continues to have pain in her left shoulder; she is unsure if she slept on her shoulder odd last night to make it hurt more today. She states that she shot given in her right shoulder at the last visit helped her considerably. She notes that she is taking 1/2 of a tramadol at night to help her sleep and that has helped improve her rest considerably as well. She would like to continue to receive cortisol injections.   Past Medical History:  Diagnosis Date  . Arthritis   . Breast cancer ([place]) 2013   left breast, radiation and lumpectomy  . Cataract   . Depression   . Fatigue   . GERD (gastroesophageal reflux disease)   . Glaucoma   . Heart murmur   . Heart palpitations   . Hiatal hernia   . HOH (hard of hearing)    AIDS  . Hyperlipidemia   . Hypertension   . Hypothyroidism   . Left ankle injury    Old left ankle injury  . Left bundle branch block   . Neuropathy   . Osteoporosis   . Pacemaker   . Palpitations   . Personal history of radiation therapy 2013   left breast ca  . Scoliosis   . Shortness of breath  dyspnea   . Thyroid nodule   . TIA (transient ischemic attack)     Past Surgical History:  Procedure Laterality Date  . ABDOMINAL HYSTERECTOMY    . BREAST BIOPSY Left 2005   bengin  . BREAST BIOPSY Left 2013   stereo bx, invasive tubular carcinoma and DCIS  . 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 BREAST SURGERY     LUMPECTOMY  . CATARACT EXTRACTION W/PHACO Right 03/01/2015   Procedure: CATARACT EXTRACTION PHACO AND INTRAOCULAR LENS PLACEMENT (IOC);  Surgeon: Birder Robson, MD;  Location: [facility] ORS;  Service: Ophthalmology;  Laterality: Right;  US:1:03.1 AP:22.8 CDE:14.37  Fluid lot # [id number]  . CATARACT EXTRACTION W/PHACO Left 03/22/2015   Procedure: CATARACT EXTRACTION PHACO AND INTRAOCULAR LENS PLACEMENT (IOC);  Surgeon: Birder Robson, MD;  Location: [facility] ORS;  Service: Ophthalmology;  Laterality: Left;  [place]: 01:01.0 AP%: 22.5% CDE: 13.75 Lot # [id number]  . MASTECTOMY PARTIAL / LUMPECTOMY    . THYROIDECTOMY, PARTIAL      Family History  Problem Relation Age of Onset  . Breast cancer Neg Hx     Social History   Socioeconomic History  . Marital status: Widowed    Spouse name: Not on file  . Number of children:  Not on file  . Years of education: Not on file  . Highest education level: Not on file  Occupational History  . Not on file  Tobacco Use  . Smoking status: Never Smoker  . Smokeless tobacco: Never Used  Substance and Sexual Activity  . Alcohol use: No  . Drug use: No  . Sexual activity: Not on file  Other Topics Concern  . Not on file  Social History Narrative  . Not on file   Social Determinants of Health   Financial Resource Strain:   . Difficulty of Paying Living Expenses:   Food Insecurity:   . Worried About Charity fundraiser in the Last Year:   . Arboriculturist in the Last Year:   Transportation Needs:   . Film/video editor (Medical):   Marland Kitchen Lack of Transportation  (Non-Medical):   Physical Activity:   . Days of Exercise per Week:   . Minutes of Exercise per Session:   Stress:   . Feeling of Stress :   Social Connections:   . Frequency of Communication with Friends and Family:   . Frequency of Social Gatherings with Friends and Family:   . Attends Religious Services:   . Active Member of Clubs or Organizations:   . Attends Archivist Meetings:   Marland Kitchen Marital Status:   Intimate Partner Violence:   . Fear of Current or Ex-Partner:   . Emotionally Abused:   Marland Kitchen Physically Abused:   . Sexually Abused:      Current Outpatient Medications:  .  acetaminophen (TYLENOL) 650 MG CR tablet, Take 1,300 mg by mouth 2 (two) times daily., Disp: , Rfl:  .  apixaban (ELIQUIS) 2.5 MG TABS tablet, Take 1 tablet (2.5 mg total) by mouth 2 (two) times daily., Disp: 60 tablet, Rfl: 6 .  calcium-vitamin D (OSCAL WITH D) 500-200 MG-UNIT tablet, Take 1 tablet by mouth daily with breakfast., Disp: , Rfl:  .  Cholecalciferol 50 MCG (2000 UT) TABS, Take 2,000 Units by mouth daily. , Disp: , Rfl:  .  Cyanocobalamin (VITAMIN B-12 PO), Take 2,500 mcg by mouth daily. , Disp: , Rfl:  .  hydrochlorothiazide (HYDRODIURIL) 25 MG tablet, Take 25 mg by mouth daily., Disp: , Rfl:  .  ibuprofen (ADVIL,MOTRIN) 200 MG tablet, Take 200 mg by mouth 2 (two) times daily., Disp: , Rfl:  .  latanoprost (XALATAN) 0.005 % ophthalmic solution, Place 1 drop into both eyes at bedtime., Disp: , Rfl:  .  letrozole (FEMARA) 2.5 MG tablet, TAKE 1 TABLET DAILY, Disp: 90 tablet, Rfl: 0 .  losartan (COZAAR) 100 MG tablet, TAKE ONE TABLET EVERY DAY, Disp: 30 tablet, Rfl: 6 .  metoprolol succinate (TOPROL-XL) 50 MG 24 hr tablet, TAKE ONE TABLET EVERY DAY, Disp: 30 tablet, Rfl: 6 .  Multiple Vitamin (MULTIVITAMIN WITH MINERALS) TABS tablet, Take 1 tablet by mouth daily., Disp: , Rfl:  .  pregabalin (LYRICA) 50 MG capsule, TAKE 1 CAPSULE BY MOUTH THREE TIMES DAILY, Disp: 90 capsule, Rfl: 1 .  traMADol  (ULTRAM) 50 MG tablet, Take 1 tablet by mouth daily. Patient takes 1/2 tablet at bedtime, Disp: , Rfl:    Allergies  Allergen Reactions  . Codeine Other (See Comments)  . Etodolac Other (See Comments)  . Zolpidem Other (See Comments)    ROS Review of Systems  Constitutional: Negative.   HENT: Negative.   Eyes: Negative.   Respiratory: Negative.   Cardiovascular: Negative.   Gastrointestinal: Negative.  Endocrine: Negative.   Genitourinary: Negative.   Musculoskeletal: Positive for arthralgias (shoulders, knees) and joint swelling.  Skin: Negative.   Allergic/Immunologic: Negative.   Neurological: Negative.   Hematological: Negative.   Psychiatric/Behavioral: Negative.   All other systems reviewed and are negative.    OBJECTIVE:    Physical Exam Vitals reviewed.  Constitutional:      Appearance: Normal appearance.  HENT:     Mouth/Throat:     Mouth: Mucous membranes are moist.  Eyes:     Pupils: Pupils are equal, round, and reactive to light.  Neck:     Vascular: No carotid bruit.  Cardiovascular:     Rate and Rhythm: Regular rhythm. Tachycardia present.     Pulses: Normal pulses.     Heart sounds: Normal heart sounds.  Pulmonary:     Effort: Pulmonary effort is normal.     Breath sounds: Normal breath sounds.  Abdominal:     General: Bowel sounds are normal.     Palpations: Abdomen is soft. There is no hepatomegaly, splenomegaly or mass.     Tenderness: There is no abdominal tenderness.     Hernia: No hernia is present.  Musculoskeletal:        General: No tenderness.     Cervical back: Neck supple.     Right lower leg: No edema.     Left lower leg: No edema.  Skin:    Findings: No rash.  Neurological:     Mental Status: She is alert and oriented to person, place, and time.     Motor: No weakness.  Psychiatric:        Mood and Affect: Mood and affect normal.        Behavior: Behavior normal.     BP 139/70   Pulse 77  Wt Readings from Last 3  Encounters:  03/03/20 136 lb 12.8 oz (62.1 kg)  02/08/20 139 lb 4.8 oz (63.2 kg)  12/10/19 137 lb 4.8 oz (62.3 kg)    Health Maintenance Due  Topic Date Due  . COVID-19 Vaccine (1) Never done  . PNA vac Low Risk Adult (2 of 2 - PCV13) 05/10/2016  . INFLUENZA VACCINE  02/21/2020    There are no preventive care reminders to display for this patient.  CBC Latest Ref Rng & Units 09/14/2019 06/09/2013 06/04/2013  WBC 4.0 - 10.5 K/uL 7.2 7.4 8.2  Hemoglobin 12.0 - 15.0 g/dL 12.5 11.4(L) 11.4(L)  Hematocrit 36 - 46 % 37.7 33.7(L) 33.3(L)  Platelets 150 - 400 K/uL 351 406 345   CMP Latest Ref Rng & Units 09/14/2019 06/09/2013 06/04/2013  Glucose 70 - 99 mg/dL 103(H) 135(H) 118(H)  BUN 8 - 23 mg/dL 35(H) 23(H) 21(H)  Creatinine 0.44 - 1.00 mg/dL 0.85 0.99 0.98  Sodium 135 - 145 mmol/L 140 138 136  Potassium 3.5 - 5.1 mmol/L 3.7 3.9 3.9  Chloride 98 - 111 mmol/L 102 105 101  CO2 22 - 32 mmol/L 28 28 29   Calcium 8.9 - 10.3 mg/dL 9.4 9.0 8.7  Total Protein 6.5 - 8.1 g/dL 6.7 - -  Total Bilirubin 0.3 - 1.2 mg/dL 0.6 - -  Alkaline Phos 38 - 126 U/L 64 - -  AST 15 - 41 U/L 21 - -  ALT 0 - 44 U/L 13 - -    Lab Results  Component Value Date   TSH 1.84 05/23/2013   Lab Results  Component Value Date   ALBUMIN 3.8 09/14/2019   ANIONGAP 10 09/14/2019  Lab Results  Component Value Date   CHOL 214 (H) 04/11/2013   HDL 65 (H) 04/11/2013   LDLCALC 131 (H) 04/11/2013   Lab Results  Component Value Date   TRIG 89 04/11/2013   No results found for: HGBA1C    ASSESSMENT & PLAN:   Problem List Items Addressed This Visit      Cardiovascular and Mediastinum   Sick sinus syndrome due to SA node dysfunction (HCC)    Pt has run of Afib. She was started on eliquis 2.5 mg PO BID      Relevant Medications   apixaban (ELIQUIS) 2.5 MG TABS tablet   Essential hypertension    Blood pressure is stable.       Relevant Medications   apixaban (ELIQUIS) 2.5 MG TABS tablet      Musculoskeletal and Integument   Rotator cuff arthropathy of left shoulder    Pt was told that she could get a shot in the left shoulder         Other   Cardiac pacemaker in situ - Primary    Cardiac pacemaker is working well.       Relevant Orders   PACEMAKER IN CLINIC CHECK (Completed)      Meds ordered this encounter  Medications  . apixaban (ELIQUIS) 2.5 MG TABS tablet    Sig: Take 1 tablet (2.5 mg total) by mouth 2 (two) times daily.    Dispense:  60 tablet    Refill:  6    Follow-up: No follow-ups on file.    Dr. Jane Canary [facility] [address], [place], [place] [address]   By signing my name below, I, [employer], attest that this documentation has been prepared under the direction and in the presence of Cletis Athens, MD. Electronically Signed: Cletis Athens, MD 03/03/20, 12:36 PM   I personally performed the services described in this documentation, which was SCRIBED in my presence. The recorded information has been reviewed and considered accurate. It has been edited as necessary during review. Cletis Athens, MD

## 2020-03-03 ENCOUNTER — Encounter: Payer: Self-pay | Admitting: Internal Medicine

## 2020-03-03 ENCOUNTER — Other Ambulatory Visit: Payer: Self-pay

## 2020-03-03 ENCOUNTER — Ambulatory Visit (INDEPENDENT_AMBULATORY_CARE_PROVIDER_SITE_OTHER): Payer: Medicare Other | Admitting: Internal Medicine

## 2020-03-03 VITALS — BP 141/66 | HR 81 | Ht 59.5 in | Wt 136.8 lb

## 2020-03-03 DIAGNOSIS — I495 Sick sinus syndrome: Secondary | ICD-10-CM

## 2020-03-03 DIAGNOSIS — I10 Essential (primary) hypertension: Secondary | ICD-10-CM | POA: Diagnosis not present

## 2020-03-03 DIAGNOSIS — M12812 Other specific arthropathies, not elsewhere classified, left shoulder: Secondary | ICD-10-CM

## 2020-03-03 DIAGNOSIS — Z950 Presence of cardiac pacemaker: Secondary | ICD-10-CM | POA: Diagnosis not present

## 2020-03-03 DIAGNOSIS — M1712 Unilateral primary osteoarthritis, left knee: Secondary | ICD-10-CM

## 2020-03-03 NOTE — Assessment & Plan Note (Signed)
Pt was told that she could get a shot in the left shoulder

## 2020-03-03 NOTE — Assessment & Plan Note (Signed)
Blood pressure is stable 

## 2020-03-03 NOTE — Assessment & Plan Note (Signed)
Pt was injected with 40 mg of Kenalog under local anesthesia.

## 2020-03-03 NOTE — Patient Instructions (Addendum)
Apply for 24 months of Eliquis at a discount at: [email or web address]

## 2020-03-03 NOTE — Assessment & Plan Note (Signed)
She is being followed up by orthopedics who suggested medical Tx

## 2020-03-03 NOTE — Assessment & Plan Note (Signed)
Pt has run of a Afib noted on pacemaker check. She was started on Eliquis. If she can not afford the medication, then we can use Coumadin. I will get a CBC and Platelet count on her today

## 2020-03-03 NOTE — Assessment & Plan Note (Signed)
Pacemaker is working well. But rhythm strip revealed run of Afib, so pt was started on Eliquis.

## 2020-03-03 NOTE — Assessment & Plan Note (Signed)
Pt has run of Afib. She was started on eliquis 2.5 mg PO BID

## 2020-03-03 NOTE — Assessment & Plan Note (Signed)
Cardiac pacemaker is working well 

## 2020-03-03 NOTE — Progress Notes (Signed)
Established Patient Office Visit  SUBJECTIVE:  Subjective  Patient ID: Sue Knapp, female    DOB: [birth year]  Age: 86 y.o. MRN: [id number]  CC:  Chief Complaint  Patient presents with  . Follow-up    1 week follow up from pacemaker evaluation    HPI Sue Knapp is a 86 y.o. female presenting today for a one week check following her recent pacemaker evaluation.   She states that she doesn't feel very good today because she feels stressed out as her schedule is out of her normal routine.   She has been on Eliquis for a few days now. Other than generally feeling bad today due to being out of her routine, she has felt well overall. She does have concern about the price. She notes that she has to pay over $100 for her medication.   She does also note continued significant shoulder pain due to arthritis.   She notes vision changes. She states that she feels like there is a film over her eyes.    Past Medical History:  Diagnosis Date  . Arthritis   . Breast cancer ([place]) 2013   left breast, radiation and lumpectomy  . Cataract   . Depression   . Fatigue   . GERD (gastroesophageal reflux disease)   . Glaucoma   . Heart murmur   . Heart palpitations   . Hiatal hernia   . HOH (hard of hearing)    AIDS  . Hyperlipidemia   . Hypertension   . Hypothyroidism   . Left ankle injury    Old left ankle injury  . Left bundle branch block   . Neuropathy   . Osteoporosis   . Pacemaker   . Palpitations   . Personal history of radiation therapy 2013   left breast ca  . Scoliosis   . Shortness of breath dyspnea   . Thyroid nodule   . TIA (transient ischemic attack)     Past Surgical History:  Procedure Laterality Date  . ABDOMINAL HYSTERECTOMY    . BREAST BIOPSY Left 2005   bengin  . BREAST BIOPSY Left 2013   stereo bx, invasive tubular carcinoma and DCIS  . 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  BREAST SURGERY     LUMPECTOMY  . CATARACT EXTRACTION W/PHACO Right 03/01/2015   Procedure: CATARACT EXTRACTION PHACO AND INTRAOCULAR LENS PLACEMENT (IOC);  Surgeon: Birder Robson, MD;  Location: [facility] ORS;  Service: Ophthalmology;  Laterality: Right;  US:1:03.1 AP:22.8 CDE:14.37  Fluid lot # [id number]  . CATARACT EXTRACTION W/PHACO Left 03/22/2015   Procedure: CATARACT EXTRACTION PHACO AND INTRAOCULAR LENS PLACEMENT (IOC);  Surgeon: Birder Robson, MD;  Location: [facility] ORS;  Service: Ophthalmology;  Laterality: Left;  [place]: 01:01.0 AP%: 22.5% CDE: 13.75 Lot # [id number]  . MASTECTOMY PARTIAL / LUMPECTOMY    . THYROIDECTOMY, PARTIAL      Family History  Problem Relation Age of Onset  . Breast cancer Neg Hx     Social History   Socioeconomic History  . Marital status: Widowed    Spouse name: Not on file  . Number of children: Not on file  . Years of education: Not on file  . Highest education level: Not on file  Occupational History  . Not on file  Tobacco Use  . Smoking status: Never Smoker  . Smokeless tobacco: Never Used  Substance and Sexual Activity  . Alcohol use: No  .  Drug use: No  . Sexual activity: Not on file  Other Topics Concern  . Not on file  Social History Narrative  . Not on file   Social Determinants of Health   Financial Resource Strain:   . Difficulty of Paying Living Expenses:   Food Insecurity:   . Worried About Charity fundraiser in the Last Year:   . Arboriculturist in the Last Year:   Transportation Needs:   . Film/video editor (Medical):   Marland Kitchen Lack of Transportation (Non-Medical):   Physical Activity:   . Days of Exercise per Week:   . Minutes of Exercise per Session:   Stress:   . Feeling of Stress :   Social Connections:   . Frequency of Communication with Friends and Family:   . Frequency of Social Gatherings with Friends and Family:   . Attends Religious Services:   . Active Member of Clubs or Organizations:   . Attends English as a second language teacher Meetings:   Marland Kitchen Marital Status:   Intimate Partner Violence:   . Fear of Current or Ex-Partner:   . Emotionally Abused:   Marland Kitchen Physically Abused:   . Sexually Abused:      Current Outpatient Medications:  .  acetaminophen (TYLENOL) 650 MG CR tablet, Take 1,300 mg by mouth 2 (two) times daily., Disp: , Rfl:  .  apixaban (ELIQUIS) 2.5 MG TABS tablet, Take 1 tablet (2.5 mg total) by mouth 2 (two) times daily., Disp: 60 tablet, Rfl: 6 .  calcium-vitamin D (OSCAL WITH D) 500-200 MG-UNIT tablet, Take 1 tablet by mouth daily with breakfast., Disp: , Rfl:  .  Cholecalciferol 50 MCG (2000 UT) TABS, Take 2,000 Units by mouth daily. , Disp: , Rfl:  .  Cyanocobalamin (VITAMIN B-12 PO), Take 2,500 mcg by mouth daily. , Disp: , Rfl:  .  hydrochlorothiazide (HYDRODIURIL) 25 MG tablet, Take 25 mg by mouth daily., Disp: , Rfl:  .  ibuprofen (ADVIL,MOTRIN) 200 MG tablet, Take 200 mg by mouth 2 (two) times daily., Disp: , Rfl:  .  latanoprost (XALATAN) 0.005 % ophthalmic solution, Place 1 drop into both eyes at bedtime., Disp: , Rfl:  .  letrozole (FEMARA) 2.5 MG tablet, TAKE 1 TABLET DAILY, Disp: 90 tablet, Rfl: 0 .  losartan (COZAAR) 100 MG tablet, TAKE ONE TABLET EVERY DAY, Disp: 30 tablet, Rfl: 6 .  metoprolol succinate (TOPROL-XL) 50 MG 24 hr tablet, TAKE ONE TABLET EVERY DAY, Disp: 30 tablet, Rfl: 6 .  Multiple Vitamin (MULTIVITAMIN WITH MINERALS) TABS tablet, Take 1 tablet by mouth daily., Disp: , Rfl:  .  pregabalin (LYRICA) 50 MG capsule, TAKE 1 CAPSULE BY MOUTH THREE TIMES DAILY, Disp: 90 capsule, Rfl: 1 .  traMADol (ULTRAM) 50 MG tablet, Take 1 tablet by mouth daily. Patient takes 1/2 tablet at bedtime, Disp: , Rfl:    Allergies  Allergen Reactions  . Codeine Other (See Comments)  . Etodolac Other (See Comments)  . Zolpidem Other (See Comments)    ROS Review of Systems  Constitutional: Negative.   HENT: Negative.   Eyes: Negative.        "film over eyes"  Respiratory:  Negative.   Cardiovascular: Negative.   Gastrointestinal: Negative.   Endocrine: Negative.   Genitourinary: Negative.   Musculoskeletal: Positive for arthralgias (shoulders).  Skin: Negative.   Allergic/Immunologic: Negative.   Neurological: Negative.   Hematological: Negative.   Psychiatric/Behavioral: The patient is nervous/anxious.   All other systems reviewed and are negative.  OBJECTIVE:    Physical Exam Vitals reviewed.  Constitutional:      Appearance: Normal appearance.  HENT:     Mouth/Throat:     Mouth: Mucous membranes are moist.  Eyes:     Pupils: Pupils are equal, round, and reactive to light.  Neck:     Vascular: No carotid bruit.  Cardiovascular:     Rate and Rhythm: Normal rate and regular rhythm.     Pulses: Normal pulses.     Heart sounds: Normal heart sounds.  Pulmonary:     Effort: Pulmonary effort is normal.     Breath sounds: Normal breath sounds.  Abdominal:     General: Bowel sounds are normal.     Palpations: Abdomen is soft. There is no hepatomegaly, splenomegaly or mass.     Tenderness: There is no abdominal tenderness.     Hernia: No hernia is present.  Musculoskeletal:        General: No tenderness.     Cervical back: Neck supple.     Right lower leg: No edema.     Left lower leg: No edema.  Skin:    Findings: No rash.  Neurological:     Mental Status: She is alert and oriented to person, place, and time.     Motor: No weakness.  Psychiatric:        Mood and Affect: Mood and affect normal.        Behavior: Behavior normal.     BP (!) 141/66   Pulse 81   Ht 4' 11.5" (1.511 m)   Wt 136 lb 12.8 oz (62.1 kg)   BMI 27.17 kg/m  Wt Readings from Last 3 Encounters:  03/03/20 136 lb 12.8 oz (62.1 kg)  02/08/20 139 lb 4.8 oz (63.2 kg)  12/10/19 137 lb 4.8 oz (62.3 kg)    Health Maintenance Due  Topic Date Due  . COVID-19 Vaccine (1) Never done  . PNA vac Low Risk Adult (2 of 2 - PCV13) 05/10/2016  . INFLUENZA VACCINE   02/21/2020    There are no preventive care reminders to display for this patient.  CBC Latest Ref Rng & Units 09/14/2019 06/09/2013 06/04/2013  WBC 4.0 - 10.5 K/uL 7.2 7.4 8.2  Hemoglobin 12.0 - 15.0 g/dL 12.5 11.4(L) 11.4(L)  Hematocrit 36 - 46 % 37.7 33.7(L) 33.3(L)  Platelets 150 - 400 K/uL 351 406 345   CMP Latest Ref Rng & Units 09/14/2019 06/09/2013 06/04/2013  Glucose 70 - 99 mg/dL 103(H) 135(H) 118(H)  BUN 8 - 23 mg/dL 35(H) 23(H) 21(H)  Creatinine 0.44 - 1.00 mg/dL 0.85 0.99 0.98  Sodium 135 - 145 mmol/L 140 138 136  Potassium 3.5 - 5.1 mmol/L 3.7 3.9 3.9  Chloride 98 - 111 mmol/L 102 105 101  CO2 22 - 32 mmol/L 28 28 29   Calcium 8.9 - 10.3 mg/dL 9.4 9.0 8.7  Total Protein 6.5 - 8.1 g/dL 6.7 - -  Total Bilirubin 0.3 - 1.2 mg/dL 0.6 - -  Alkaline Phos 38 - 126 U/L 64 - -  AST 15 - 41 U/L 21 - -  ALT 0 - 44 U/L 13 - -    Lab Results  Component Value Date   TSH 1.84 05/23/2013   Lab Results  Component Value Date   ALBUMIN 3.8 09/14/2019   ANIONGAP 10 09/14/2019   Lab Results  Component Value Date   CHOL 214 (H) 04/11/2013   HDL 65 (H) 04/11/2013   LDLCALC 131 (H) 04/11/2013  Lab Results  Component Value Date   TRIG 89 04/11/2013   No results found for: HGBA1C    ASSESSMENT & PLAN:   Problem List Items Addressed This Visit      Cardiovascular and Mediastinum   Sick sinus syndrome due to SA node dysfunction (HCC) - Primary    Pt has run of a Afib noted on pacemaker check. She was started on Eliquis. If she can not afford the medication, then we can use Coumadin. I will get a CBC and Platelet count on her today      Essential hypertension    Blood pressure is stable        Musculoskeletal and Integument   Primary osteoarthritis of left knee    She is being followed up by orthopedics who suggested medical Tx      Rotator cuff arthropathy of left shoulder    Pt was injected with 40 mg of Kenalog under local anesthesia.         Other   Cardiac  pacemaker in situ    Pacemaker is working well. But rhythm strip revealed run of Afib, so pt was started on Eliquis.          No orders of the defined types were placed in this encounter.  Joint Injection/Arthrocentesis  Date/Time: 03/03/2020 11:49 AM Performed by: Cletis Athens, MD Authorized by: Cletis Athens, MD  Indications: pain  Body area: shoulder Joint: left shoulder Local anesthesia used: yes  Anesthesia: Local anesthesia used: yes Local Anesthetic: lidocaine 1% without epinephrine  Sedation: Patient sedated: no  Needle size: 22 G Ultrasound guidance: no Approach: posterior Aspirate: serous Triamcinolone amount: 40 mg Bupivacaine 0.25% amount: 1 mL Lidocaine 1% amount: 1 mL Patient tolerance: patient tolerated the procedure well with no immediate complications Comments: Left shoulder was prepared with alcohol after informed consent was given. Pt was injected with 1% lidocaine anesthesia the posterier side of the left shoulder with 40 mg of Kenalog and Marcan 1 cc. Pt tolerated the procedure well and was asked to wait in the waiting room for 3-4 minutes before going home.        Follow-up: No follow-ups on file.    Dr. Jane Canary [facility] [address] [place], [place] [address]   By signing my name below, I, [employer], attest that this documentation has been prepared under the direction and in the presence of Cletis Athens, MD. Electronically Signed: Cletis Athens, MD 03/03/20, 11:52 AM   I personally performed the services described in this documentation, which was SCRIBED in my presence. The recorded information has been reviewed and considered accurate. It has been edited as necessary during review. Cletis Athens, MD

## 2020-03-04 ENCOUNTER — Other Ambulatory Visit: Payer: Self-pay

## 2020-03-04 DIAGNOSIS — I10 Essential (primary) hypertension: Secondary | ICD-10-CM

## 2020-03-04 LAB — CBC WITH DIFFERENTIAL/PLATELET
Absolute Monocytes: 479 cells/uL (ref 200–950)
Basophils Absolute: 69 cells/uL (ref 0–200)
Basophils Relative: 1.1 %
Eosinophils Absolute: 101 cells/uL (ref 15–500)
Eosinophils Relative: 1.6 %
HCT: 37.9 % (ref 35.0–45.0)
Hemoglobin: 12.2 g/dL (ref 11.7–15.5)
Lymphs Abs: 844 cells/uL — ABNORMAL LOW (ref 850–3900)
MCH: 29.6 pg (ref 27.0–33.0)
MCHC: 32.2 g/dL (ref 32.0–36.0)
MCV: 92 fL (ref 80.0–100.0)
MPV: 10.4 fL (ref 7.5–12.5)
Monocytes Relative: 7.6 %
Neutro Abs: 4807 cells/uL (ref 1500–7800)
Neutrophils Relative %: 76.3 %
Platelets: 272 10*3/uL (ref 140–400)
RBC: 4.12 10*6/uL (ref 3.80–5.10)
RDW: 13.5 % (ref 11.0–15.0)
Total Lymphocyte: 13.4 %
WBC: 6.3 10*3/uL (ref 3.8–10.8)

## 2020-03-04 NOTE — Addendum Note (Signed)
Addended by: Anson [place] R on: 03/04/2020 09:27 AM   Modules accepted: Orders

## 2020-03-14 ENCOUNTER — Ambulatory Visit
Admission: RE | Admit: 2020-03-14 | Discharge: 2020-03-14 | Disposition: A | Discharge status: Home / Self Care | Payer: Medicare Other | Source: Ambulatory Visit | Attending: Internal Medicine | Admitting: Internal Medicine

## 2020-03-14 ENCOUNTER — Other Ambulatory Visit: Payer: Self-pay

## 2020-03-14 DIAGNOSIS — Z1231 Encounter for screening mammogram for malignant neoplasm of breast: Secondary | ICD-10-CM | POA: Insufficient documentation

## 2020-04-04 ENCOUNTER — Other Ambulatory Visit: Payer: Self-pay

## 2020-04-04 ENCOUNTER — Ambulatory Visit (INDEPENDENT_AMBULATORY_CARE_PROVIDER_SITE_OTHER): Payer: Medicare Other | Admitting: Internal Medicine

## 2020-04-04 ENCOUNTER — Encounter: Payer: Self-pay | Admitting: Internal Medicine

## 2020-04-04 VITALS — BP 159/70 | HR 86 | Ht 59.5 in | Wt 138.7 lb

## 2020-04-04 DIAGNOSIS — S63659A Sprain of metacarpophalangeal joint of unspecified finger, initial encounter: Secondary | ICD-10-CM

## 2020-04-04 DIAGNOSIS — G894 Chronic pain syndrome: Secondary | ICD-10-CM

## 2020-04-04 DIAGNOSIS — M12812 Other specific arthropathies, not elsewhere classified, left shoulder: Secondary | ICD-10-CM | POA: Diagnosis not present

## 2020-04-04 DIAGNOSIS — L309 Dermatitis, unspecified: Secondary | ICD-10-CM | POA: Insufficient documentation

## 2020-04-04 DIAGNOSIS — I10 Essential (primary) hypertension: Secondary | ICD-10-CM | POA: Diagnosis not present

## 2020-04-04 DIAGNOSIS — M170 Bilateral primary osteoarthritis of knee: Secondary | ICD-10-CM

## 2020-04-04 MED ORDER — TRIAMCINOLONE ACETONIDE 0.1 % EX CREA: 1.0000 "application " | TOPICAL_CREAM | Freq: Two times a day (BID) | CUTANEOUS | 0 refills | Status: DC

## 2020-04-04 NOTE — Assessment & Plan Note (Signed)
It is a chronic problem. We will refer her to orthopedics.

## 2020-04-04 NOTE — Assessment & Plan Note (Signed)
Apply the steroid cream on the right hand

## 2020-04-04 NOTE — Progress Notes (Signed)
Established Patient Office Visit  SUBJECTIVE:  Subjective  Patient ID: Sue Knapp, female    DOB: November 19, 1922  Age: 86 y.o. MRN: [id number]  CC:  Chief Complaint  Patient presents with  . finger swelling    swellig and redness of finger of right hand x 2 days     HPI Sue Knapp is a 86 y.o. female presenting today for swelling of the hand with stiffness for two days.  She notes that she continues to hurt in her joints and the cortisone shot that was given at the last appointment did not seem to improve her pain at all. She now notes that she has has swelling, pain, and stiffness in her right hand over the last two days, primarily over her 4th digit. She denies any knowledge of trauma or insect bites. She is concerned about having to deal with this pain for the rest of her life.    Past Medical History:  Diagnosis Date  . Arthritis   . Breast cancer ([facility]) 2013   left breast, radiation and lumpectomy  . Cataract   . Depression   . Fatigue   . GERD (gastroesophageal reflux disease)   . Glaucoma   . Heart murmur   . Heart palpitations   . Hiatal hernia   . HOH (hard of hearing)    AIDS  . Hyperlipidemia   . Hypertension   . Hypothyroidism   . Left ankle injury    Old left ankle injury  . Left bundle branch block   . Neuropathy   . Osteoporosis   . Pacemaker   . Palpitations   . Personal history of radiation therapy 2013   left breast ca  . Scoliosis   . Shortness of breath dyspnea   . Thyroid nodule   . TIA (transient ischemic attack)     Past Surgical History:  Procedure Laterality Date  . ABDOMINAL HYSTERECTOMY    . BREAST BIOPSY Left 2005   bengin  . BREAST BIOPSY Left 2013   stereo bx, invasive tubular carcinoma and DCIS  . 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 BREAST SURGERY     LUMPECTOMY  . CATARACT EXTRACTION W/PHACO Right 03/01/2015   Procedure: CATARACT EXTRACTION PHACO  AND INTRAOCULAR LENS PLACEMENT (IOC);  Surgeon: Birder Robson, MD;  Location: [facility] ORS;  Service: Ophthalmology;  Laterality: Right;  US:1:03.1 AP:22.8 CDE:14.37  Fluid lot # [id number]  . CATARACT EXTRACTION W/PHACO Left 03/22/2015   Procedure: CATARACT EXTRACTION PHACO AND INTRAOCULAR LENS PLACEMENT (IOC);  Surgeon: Birder Robson, MD;  Location: [facility] ORS;  Service: Ophthalmology;  Laterality: Left;  [place]: 01:01.0 AP%: 22.5% CDE: 13.75 Lot # [id number]  . MASTECTOMY PARTIAL / LUMPECTOMY    . THYROIDECTOMY, PARTIAL      Family History  Problem Relation Age of Onset  . Breast cancer Neg Hx     Social History   Socioeconomic History  . Marital status: Widowed    Spouse name: Not on file  . Number of children: Not on file  . Years of education: Not on file  . Highest education level: Not on file  Occupational History  . Not on file  Tobacco Use  . Smoking status: Never Smoker  . Smokeless tobacco: Never Used  Substance and Sexual Activity  . Alcohol use: No  . Drug use: No  . Sexual activity: Not on file  Other Topics Concern  . Not on  file  Social History Narrative  . Not on file   Social Determinants of Health   Financial Resource Strain:   . Difficulty of Paying Living Expenses: Not on file  Food Insecurity:   . Worried About Charity fundraiser in the Last Year: Not on file  . Ran Out of Food in the Last Year: Not on file  Transportation Needs:   . Lack of Transportation (Medical): Not on file  . Lack of Transportation (Non-Medical): Not on file  Physical Activity:   . Days of Exercise per Week: Not on file  . Minutes of Exercise per Session: Not on file  Stress:   . Feeling of Stress : Not on file  Social Connections:   . Frequency of Communication with Friends and Family: Not on file  . Frequency of Social Gatherings with Friends and Family: Not on file  . Attends Religious Services: Not on file  . Active Member of Clubs or Organizations: Not on file  .  Attends Archivist Meetings: Not on file  . Marital Status: Not on file  Intimate Partner Violence:   . Fear of Current or Ex-Partner: Not on file  . Emotionally Abused: Not on file  . Physically Abused: Not on file  . Sexually Abused: Not on file     Current Outpatient Medications:  .  acetaminophen (TYLENOL) 650 MG CR tablet, Take 1,300 mg by mouth 2 (two) times daily., Disp: , Rfl:  .  apixaban (ELIQUIS) 2.5 MG TABS tablet, Take 1 tablet (2.5 mg total) by mouth 2 (two) times daily., Disp: 60 tablet, Rfl: 6 .  calcium-vitamin D (OSCAL WITH D) 500-200 MG-UNIT tablet, Take 1 tablet by mouth daily with breakfast., Disp: , Rfl:  .  Cholecalciferol 50 MCG (2000 UT) TABS, Take 2,000 Units by mouth daily. , Disp: , Rfl:  .  Cyanocobalamin (VITAMIN B-12 PO), Take 2,500 mcg by mouth daily. , Disp: , Rfl:  .  hydrochlorothiazide (HYDRODIURIL) 25 MG tablet, Take 25 mg by mouth daily., Disp: , Rfl:  .  ibuprofen (ADVIL,MOTRIN) 200 MG tablet, Take 200 mg by mouth 2 (two) times daily., Disp: , Rfl:  .  latanoprost (XALATAN) 0.005 % ophthalmic solution, Place 1 drop into both eyes at bedtime., Disp: , Rfl:  .  letrozole (FEMARA) 2.5 MG tablet, TAKE 1 TABLET DAILY, Disp: 90 tablet, Rfl: 0 .  losartan (COZAAR) 100 MG tablet, TAKE ONE TABLET EVERY DAY, Disp: 30 tablet, Rfl: 6 .  metoprolol succinate (TOPROL-XL) 50 MG 24 hr tablet, TAKE ONE TABLET EVERY DAY, Disp: 30 tablet, Rfl: 6 .  Multiple Vitamin (MULTIVITAMIN WITH MINERALS) TABS tablet, Take 1 tablet by mouth daily., Disp: , Rfl:  .  pregabalin (LYRICA) 50 MG capsule, TAKE 1 CAPSULE BY MOUTH THREE TIMES DAILY, Disp: 90 capsule, Rfl: 1 .  traMADol (ULTRAM) 50 MG tablet, Take 1 tablet by mouth daily. Patient takes 1/2 tablet at bedtime, Disp: , Rfl:  .  triamcinolone cream (KENALOG) 0.1 %, Apply 1 application topically 2 (two) times daily., Disp: 30 g, Rfl: 0   Allergies  Allergen Reactions  . Codeine Other (See Comments)  . Etodolac Other  (See Comments)  . Zolpidem Other (See Comments)    ROS Review of Systems  Constitutional: Negative.   HENT: Negative.   Eyes: Negative.   Respiratory: Negative.   Cardiovascular: Negative.   Gastrointestinal: Negative.   Endocrine: Negative.   Genitourinary: Negative.   Musculoskeletal: Positive for arthralgias.  Skin: Positive  for color change (R 4th digit).  Allergic/Immunologic: Negative.   Neurological: Negative.   Hematological: Negative.   Psychiatric/Behavioral: Positive for dysphoric mood (discouraged due to pain).  All other systems reviewed and are negative.    OBJECTIVE:    Physical Exam Vitals reviewed.  Constitutional:      Appearance: Normal appearance.  HENT:     Mouth/Throat:     Mouth: Mucous membranes are moist.  Eyes:     Pupils: Pupils are equal, round, and reactive to light.  Neck:     Vascular: No carotid bruit.  Cardiovascular:     Rate and Rhythm: Normal rate and regular rhythm.     Pulses: Normal pulses.     Heart sounds: Normal heart sounds.  Pulmonary:     Effort: Pulmonary effort is normal.     Breath sounds: Normal breath sounds.  Abdominal:     General: Bowel sounds are normal.     Palpations: Abdomen is soft. There is no hepatomegaly, splenomegaly or mass.     Tenderness: There is no abdominal tenderness.     Hernia: No hernia is present.  Musculoskeletal:        General: No tenderness.     Cervical back: Neck supple.     Right lower leg: No edema.     Left lower leg: No edema.  Skin:    Findings: Bruising (arms) and erythema (R 4th digit) present. No rash.  Neurological:     Mental Status: She is alert and oriented to person, place, and time.     Motor: No weakness.  Psychiatric:        Mood and Affect: Mood and affect normal.        Behavior: Behavior normal.     BP (!) 159/70   Pulse 86   Ht 4' 11.5" (1.511 m)   Wt 138 lb 11.2 oz (62.9 kg)   BMI 27.55 kg/m  Wt Readings from Last 3 Encounters:  04/04/20 138 lb  11.2 oz (62.9 kg)  03/03/20 136 lb 12.8 oz (62.1 kg)  02/08/20 139 lb 4.8 oz (63.2 kg)    Health Maintenance Due  Topic Date Due  . COVID-19 Vaccine (1) Never done  . PNA vac Low Risk Adult (2 of 2 - PCV13) 05/10/2016  . INFLUENZA VACCINE  Never done    There are no preventive care reminders to display for this patient.  CBC Latest Ref Rng & Units 03/03/2020 09/14/2019 06/09/2013  WBC 3.8 - 10.8 Thousand/uL 6.3 7.2 7.4  Hemoglobin 11.7 - 15.5 g/dL 12.2 12.5 11.4(L)  Hematocrit 35 - 45 % 37.9 37.7 33.7(L)  Platelets 140 - 400 Thousand/uL 272 351 406   CMP Latest Ref Rng & Units 09/14/2019 06/09/2013 06/04/2013  Glucose 70 - 99 mg/dL 103(H) 135(H) 118(H)  BUN 8 - 23 mg/dL 35(H) 23(H) 21(H)  Creatinine 0.44 - 1.00 mg/dL 0.85 0.99 0.98  Sodium 135 - 145 mmol/L 140 138 136  Potassium 3.5 - 5.1 mmol/L 3.7 3.9 3.9  Chloride 98 - 111 mmol/L 102 105 101  CO2 22 - 32 mmol/L 28 28 29   Calcium 8.9 - 10.3 mg/dL 9.4 9.0 8.7  Total Protein 6.5 - 8.1 g/dL 6.7 - -  Total Bilirubin 0.3 - 1.2 mg/dL 0.6 - -  Alkaline Phos 38 - 126 U/L 64 - -  AST 15 - 41 U/L 21 - -  ALT 0 - 44 U/L 13 - -    Lab Results  Component Value Date  TSH 1.84 05/23/2013   Lab Results  Component Value Date   ALBUMIN 3.8 09/14/2019   ANIONGAP 10 09/14/2019   Lab Results  Component Value Date   CHOL 214 (H) 04/11/2013   HDL 65 (H) 04/11/2013   LDLCALC 131 (H) 04/11/2013   Lab Results  Component Value Date   TRIG 89 04/11/2013   No results found for: HGBA1C    ASSESSMENT & PLAN:   Problem List Items Addressed This Visit      Cardiovascular and Mediastinum   Essential hypertension    - Today, the patient's blood pressure is well managed on losartan and beta blocker. - The patient will continue the current treatment regimen.  - I encouraged the patient to eat a low-sodium diet to help control blood pressure. - I encouraged the patient to live an active lifestyle and complete activities that increases  heart rate to 85% target heart rate at least 5 times per week for one hour.           Musculoskeletal and Integument   Bilateral primary osteoarthritis of knee    It is a chronic problem. We will refer her to orthopedics.       Rotator cuff arthropathy of left shoulder    Pt continues to complain of pain in both shoulders. Left shoulder more than the right. Cortisone shot did not help. She will be referred to ortho.       MP (metacarpophalangeal) joint sprain, initial encounter    Pt has swelling of the right 4th MP joint. She was advised to try advil. There is no evidence of insect bite. She was given steroid cream to apply to the joint. Return in one week if not improved.       Dermatitis - Primary    Apply the steroid cream on the right hand      Relevant Medications   triamcinolone cream (KENALOG) 0.1 %     Other   Chronic pain syndrome    Pt has been seen by a pain specialist in the past. She is taking lyrica as well as tramadol.          Meds ordered this encounter  Medications  . triamcinolone cream (KENALOG) 0.1 %    Sig: Apply 1 application topically 2 (two) times daily.    Dispense:  30 g    Refill:  0    Follow-up: No follow-ups on file.    Cletis Athens, MD [facility] [address], [place], [place] [address]   By signing my name below, I, [employer], attest that this documentation has been prepared under the direction and in the presence of Dr. Cletis Athens Electronically Signed: Cletis Athens, MD 04/04/20, 11:58 AM  I personally performed the services described in this documentation, which was SCRIBED in my presence. The recorded information has been reviewed and considered accurate. It has been edited as necessary during review. Cletis Athens, MD

## 2020-04-04 NOTE — Assessment & Plan Note (Signed)
-   Today, the patient's blood pressure is well managed on losartan and beta blocker. - The patient will continue the current treatment regimen.  - I encouraged the patient to eat a low-sodium diet to help control blood pressure. - I encouraged the patient to live an active lifestyle and complete activities that increases heart rate to 85% target heart rate at least 5 times per week for one hour.

## 2020-04-04 NOTE — Assessment & Plan Note (Signed)
Pt continues to complain of pain in both shoulders. Left shoulder more than the right. Cortisone shot did not help. She will be referred to ortho.

## 2020-04-04 NOTE — Assessment & Plan Note (Signed)
Pt has been seen by a pain specialist in the past. She is taking lyrica as well as tramadol.

## 2020-04-04 NOTE — Assessment & Plan Note (Signed)
Pt has swelling of the right 4th MP joint. She was advised to try advil. There is no evidence of insect bite. She was given steroid cream to apply to the joint. Return in one week if not improved.

## 2020-04-05 ENCOUNTER — Other Ambulatory Visit: Payer: Self-pay | Admitting: Internal Medicine

## 2020-05-10 ENCOUNTER — Ambulatory Visit (INDEPENDENT_AMBULATORY_CARE_PROVIDER_SITE_OTHER): Payer: Medicare Other | Admitting: Internal Medicine

## 2020-05-10 ENCOUNTER — Other Ambulatory Visit: Payer: Self-pay

## 2020-05-10 ENCOUNTER — Encounter: Payer: Self-pay | Admitting: Internal Medicine

## 2020-05-10 VITALS — BP 133/87 | HR 82 | Ht 59.0 in | Wt 139.6 lb

## 2020-05-10 DIAGNOSIS — I495 Sick sinus syndrome: Secondary | ICD-10-CM

## 2020-05-10 DIAGNOSIS — Z853 Personal history of malignant neoplasm of breast: Secondary | ICD-10-CM

## 2020-05-10 DIAGNOSIS — I10 Essential (primary) hypertension: Secondary | ICD-10-CM | POA: Diagnosis not present

## 2020-05-10 DIAGNOSIS — M170 Bilateral primary osteoarthritis of knee: Secondary | ICD-10-CM

## 2020-05-10 DIAGNOSIS — Z0000 Encounter for general adult medical examination without abnormal findings: Secondary | ICD-10-CM

## 2020-05-10 DIAGNOSIS — G894 Chronic pain syndrome: Secondary | ICD-10-CM

## 2020-05-10 NOTE — Assessment & Plan Note (Addendum)
Patient has a pacemaker placed in the left side of the chest it is functioning well.  She does not have any arrhythmia.  Or tachycardia.  She denies any chest pain heart is regular chest is clear abdomin is soft nontender she has a small abdominal hernia.

## 2020-05-10 NOTE — Assessment & Plan Note (Signed)
-   Today, the patient's blood pressure is well managed on present regime. - The patient will continue the current treatment regimen.  - I encouraged the patient to eat a low-sodium diet to help control blood pressure. - I encouraged the patient to live an active lifestyle and complete activities that increases heart rate to 85% target heart rate at least 5 times per week for one hour.      

## 2020-05-10 NOTE — Assessment & Plan Note (Signed)
Patient has a flu shot and Covid shot and booster.  HEENT examination is unremarkable neck is supple jugular venous pressure is not elevated chest is clear on today's examination.  Abdomen is nontender.

## 2020-05-10 NOTE — Assessment & Plan Note (Signed)
Patient has a myalgia and has bilateral arthritis of the both knees has been seen by the orthopedic surgeon.  She also has rotator cuff syndrome of the both shoulder and has required cortisone shot intermittently.

## 2020-05-10 NOTE — Assessment & Plan Note (Signed)
Patient is a chronic pain syndrome with fibromyalgia and more different kind of myalgia and osteoarthritis of the both knees and both shoulder with rotator cuff syndrome.  She is taking Lyrica 50 mg p.o. 3 times a day for that

## 2020-05-10 NOTE — Progress Notes (Signed)
Established Patient Office Visit  Subjective:  Patient ID: Sue Knapp, female    DOB: 1923/02/20  Age: 86 y.o. MRN: [id number]  CC:  Chief Complaint  Patient presents with  . Annual Exam    HPI  Sue Knapp presents for for annual exam.  Patient has problem with myalgia knee pain both shoulder pain.  She is known to have sick sinus syndrome and has a pacemaker on the left side. Blood pressure is under control. She also has a chronic pain syndrome. Has a history of left breast cancer.  Patient medicines were reviewed. At the present time she takes Lyrica for the pain and tramadol 50 mg p.o. as needed. She has received her flu shot.  Past Medical History:  Diagnosis Date  . Arthritis   . Breast cancer ([place]) 2013   left breast, radiation and lumpectomy  . Cataract   . Depression   . Fatigue   . GERD (gastroesophageal reflux disease)   . Glaucoma   . Heart murmur   . Heart palpitations   . Hiatal hernia   . HOH (hard of hearing)    AIDS  . Hyperlipidemia   . Hypertension   . Hypothyroidism   . Left ankle injury    Old left ankle injury  . Left bundle branch block   . Neuropathy   . Osteoporosis   . Pacemaker   . Palpitations   . Personal history of radiation therapy 2013   left breast ca  . Scoliosis   . Shortness of breath dyspnea   . Thyroid nodule   . TIA (transient ischemic attack)     Past Surgical History:  Procedure Laterality Date  . ABDOMINAL HYSTERECTOMY    . BREAST BIOPSY Left 2005   bengin  . BREAST BIOPSY Left 2013   stereo bx, invasive tubular carcinoma and DCIS  . 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 BREAST SURGERY     LUMPECTOMY  . CATARACT EXTRACTION W/PHACO Right 03/01/2015   Procedure: CATARACT EXTRACTION PHACO AND INTRAOCULAR LENS PLACEMENT (IOC);  Surgeon: Birder Robson, MD;  Location: [facility] ORS;  Service: Ophthalmology;  Laterality: Right;   US:1:03.1 AP:22.8 CDE:14.37  Fluid lot # [id number]  . CATARACT EXTRACTION W/PHACO Left 03/22/2015   Procedure: CATARACT EXTRACTION PHACO AND INTRAOCULAR LENS PLACEMENT (IOC);  Surgeon: Birder Robson, MD;  Location: [facility] ORS;  Service: Ophthalmology;  Laterality: Left;  [place]: 01:01.0 AP%: 22.5% CDE: 13.75 Lot # [id number]  . MASTECTOMY PARTIAL / LUMPECTOMY    . THYROIDECTOMY, PARTIAL      Family History  Problem Relation Age of Onset  . Breast cancer Neg Hx     Social History   Socioeconomic History  . Marital status: Widowed    Spouse name: Not on file  . Number of children: Not on file  . Years of education: Not on file  . Highest education level: Not on file  Occupational History  . Not on file  Tobacco Use  . Smoking status: Never Smoker  . Smokeless tobacco: Never Used  Substance and Sexual Activity  . Alcohol use: No  . Drug use: No  . Sexual activity: Not on file  Other Topics Concern  . Not on file  Social History Narrative  . Not on file   Social Determinants of Health   Financial Resource Strain:   . Difficulty of Paying Living Expenses: Not on file  Food Insecurity:   .  Worried About Charity fundraiser in the Last Year: Not on file  . Ran Out of Food in the Last Year: Not on file  Transportation Needs:   . Lack of Transportation (Medical): Not on file  . Lack of Transportation (Non-Medical): Not on file  Physical Activity:   . Days of Exercise per Week: Not on file  . Minutes of Exercise per Session: Not on file  Stress:   . Feeling of Stress : Not on file  Social Connections:   . Frequency of Communication with Friends and Family: Not on file  . Frequency of Social Gatherings with Friends and Family: Not on file  . Attends Religious Services: Not on file  . Active Member of Clubs or Organizations: Not on file  . Attends Archivist Meetings: Not on file  . Marital Status: Not on file  Intimate Partner Violence:   . Fear of Current or  Ex-Partner: Not on file  . Emotionally Abused: Not on file  . Physically Abused: Not on file  . Sexually Abused: Not on file     Current Outpatient Medications:  .  acetaminophen (TYLENOL) 650 MG CR tablet, Take 1,300 mg by mouth 2 (two) times daily., Disp: , Rfl:  .  apixaban (ELIQUIS) 2.5 MG TABS tablet, Take 1 tablet (2.5 mg total) by mouth 2 (two) times daily., Disp: 60 tablet, Rfl: 6 .  calcium-vitamin D (OSCAL WITH D) 500-200 MG-UNIT tablet, Take 1 tablet by mouth daily with breakfast., Disp: , Rfl:  .  Cholecalciferol 50 MCG (2000 UT) TABS, Take 2,000 Units by mouth daily. , Disp: , Rfl:  .  Cyanocobalamin (VITAMIN B-12 PO), Take 2,500 mcg by mouth daily. , Disp: , Rfl:  .  hydrochlorothiazide (HYDRODIURIL) 25 MG tablet, TAKE 1 TABLET BY MOUTH DAILY, Disp: 30 tablet, Rfl: 6 .  ibuprofen (ADVIL,MOTRIN) 200 MG tablet, Take 200 mg by mouth 2 (two) times daily., Disp: , Rfl:  .  latanoprost (XALATAN) 0.005 % ophthalmic solution, Place 1 drop into both eyes at bedtime., Disp: , Rfl:  .  letrozole (FEMARA) 2.5 MG tablet, TAKE 1 TABLET DAILY, Disp: 90 tablet, Rfl: 0 .  losartan (COZAAR) 100 MG tablet, TAKE ONE TABLET EVERY DAY, Disp: 30 tablet, Rfl: 6 .  metoprolol succinate (TOPROL-XL) 50 MG 24 hr tablet, TAKE ONE TABLET EVERY DAY, Disp: 30 tablet, Rfl: 6 .  Multiple Vitamin (MULTIVITAMIN WITH MINERALS) TABS tablet, Take 1 tablet by mouth daily., Disp: , Rfl:  .  pregabalin (LYRICA) 50 MG capsule, TAKE 1 CAPSULE BY MOUTH THREE TIMES DAILY, Disp: 90 capsule, Rfl: 1 .  traMADol (ULTRAM) 50 MG tablet, Take 1 tablet by mouth daily. Patient takes 1/2 tablet at bedtime, Disp: , Rfl:  .  triamcinolone cream (KENALOG) 0.1 %, Apply 1 application topically 2 (two) times daily., Disp: 30 g, Rfl: 0   Allergies  Allergen Reactions  . Codeine Other (See Comments)  . Etodolac Other (See Comments)  . Zolpidem Other (See Comments)    ROS Review of Systems  Constitutional: Positive for fatigue.   HENT: Negative.  Negative for ear pain, hearing loss and trouble swallowing.   Eyes: Negative.   Respiratory: Negative.  Negative for chest tightness and shortness of breath.   Cardiovascular: Negative.   Gastrointestinal: Negative.  Negative for nausea.  Endocrine: Negative.   Genitourinary: Negative.  Negative for pelvic pain.  Musculoskeletal: Negative.   Skin: Negative.   Allergic/Immunologic: Negative.   Neurological: Negative.  Negative for  seizures and headaches.  Hematological: Negative.   Psychiatric/Behavioral: Negative.  The patient is not hyperactive.   All other systems reviewed and are negative.     Objective:    Physical Exam Vitals reviewed.  Constitutional:      Appearance: Normal appearance.  HENT:     Mouth/Throat:     Mouth: Mucous membranes are moist.  Eyes:     Pupils: Pupils are equal, round, and reactive to light.  Neck:     Vascular: No carotid bruit.  Cardiovascular:     Rate and Rhythm: Normal rate and regular rhythm.     Pulses: Normal pulses.     Heart sounds: Normal heart sounds.  Pulmonary:     Effort: Pulmonary effort is normal.     Breath sounds: Normal breath sounds.  Abdominal:     General: Bowel sounds are normal.     Palpations: Abdomen is soft. There is no hepatomegaly, splenomegaly or mass.     Tenderness: There is no abdominal tenderness.     Hernia: No hernia is present.  Musculoskeletal:        General: No tenderness.     Cervical back: Neck supple.     Right lower leg: No edema.     Left lower leg: No edema.     Comments: Patient has complained of both shoulder pain both knee pain.  He does not have any problem with concentration.  Skin:    Findings: No rash.  Neurological:     Mental Status: She is alert and oriented to person, place, and time.     Motor: No weakness.  Psychiatric:        Mood and Affect: Mood and affect normal.        Behavior: Behavior normal.     BP 133/87   Pulse 82   Ht 4\' 11"  (1.499 m)    Wt 139 lb 9.6 oz (63.3 kg)   BMI 28.20 kg/m  Wt Readings from Last 3 Encounters:  05/10/20 139 lb 9.6 oz (63.3 kg)  04/04/20 138 lb 11.2 oz (62.9 kg)  03/03/20 136 lb 12.8 oz (62.1 kg)     Health Maintenance Due  Topic Date Due  . COVID-19 Vaccine (1) Never done  . PNA vac Low Risk Adult (2 of 2 - PCV13) 05/10/2016    There are no preventive care reminders to display for this patient.  Lab Results  Component Value Date   TSH 1.84 05/23/2013   Lab Results  Component Value Date   WBC 6.3 03/03/2020   HGB 12.2 03/03/2020   HCT 37.9 03/03/2020   MCV 92.0 03/03/2020   PLT 272 03/03/2020   Lab Results  Component Value Date   NA 140 09/14/2019   K 3.7 09/14/2019   CO2 28 09/14/2019   GLUCOSE 103 (H) 09/14/2019   BUN 35 (H) 09/14/2019   CREATININE 0.85 09/14/2019   BILITOT 0.6 09/14/2019   ALKPHOS 64 09/14/2019   AST 21 09/14/2019   ALT 13 09/14/2019   PROT 6.7 09/14/2019   ALBUMIN 3.8 09/14/2019   CALCIUM 9.4 09/14/2019   ANIONGAP 10 09/14/2019   Lab Results  Component Value Date   CHOL 214 (H) 04/11/2013   Lab Results  Component Value Date   HDL 65 (H) 04/11/2013   Lab Results  Component Value Date   LDLCALC 131 (H) 04/11/2013   Lab Results  Component Value Date   TRIG 89 04/11/2013   No results found for: CHOLHDL No  results found for: HGBA1C    Assessment & Plan:   Problem List Items Addressed This Visit      Cardiovascular and Mediastinum   Sick sinus syndrome due to SA node dysfunction [facility])    Patient has a pacemaker placed in the left side of the chest it is functioning well.  She does not have any arrhythmia.  Or tachycardia.  She denies any chest pain heart is regular chest is clear abdomin is soft nontender she has a small abdominal hernia.      Essential hypertension    - Today, the patient's blood pressure is well managed on present  regime. - The patient will continue the current treatment regimen.  - I encouraged the patient to eat  a low-sodium diet to help control blood pressure. - I encouraged the patient to live an active lifestyle and complete activities that increases heart rate to 85% target heart rate at least 5 times per week for one hour.            Musculoskeletal and Integument   Bilateral primary osteoarthritis of knee    Patient has a myalgia and has bilateral arthritis of the both knees has been seen by the orthopedic surgeon.  She also has rotator cuff syndrome of the both shoulder and has required cortisone shot intermittently.        Other   Chronic pain syndrome    Patient is a chronic pain syndrome with fibromyalgia and more different kind of myalgia and osteoarthritis of the both knees and both shoulder with rotator cuff syndrome.  She is taking Lyrica 50 mg p.o. 3 times a day for that      Hx of breast cancer    Patient patient has a breast cancer in the left breast and had a mammogram recently and it was unremarkable.      Annual physical exam - Primary    Patient has a flu shot and Covid shot and booster.  HEENT examination is unremarkable neck is supple jugular venous pressure is not elevated chest is clear on today's examination.  Abdomen is nontender.         No orders of the defined types were placed in this encounter.   Follow-up: No follow-ups on file.    Cletis Athens, MD

## 2020-05-10 NOTE — Assessment & Plan Note (Signed)
Patient patient has a breast cancer in the left breast and had a mammogram recently and it was unremarkable.

## 2020-05-12 ENCOUNTER — Other Ambulatory Visit: Payer: Self-pay | Admitting: Internal Medicine

## 2020-05-12 ENCOUNTER — Other Ambulatory Visit: Payer: Self-pay | Admitting: *Deleted

## 2020-05-12 DIAGNOSIS — G894 Chronic pain syndrome: Secondary | ICD-10-CM

## 2020-05-26 ENCOUNTER — Ambulatory Visit (INDEPENDENT_AMBULATORY_CARE_PROVIDER_SITE_OTHER): Payer: Medicare Other | Admitting: Internal Medicine

## 2020-05-26 ENCOUNTER — Other Ambulatory Visit: Payer: Self-pay

## 2020-05-26 VITALS — BP 132/61 | HR 81

## 2020-05-26 DIAGNOSIS — M1711 Unilateral primary osteoarthritis, right knee: Secondary | ICD-10-CM

## 2020-05-26 DIAGNOSIS — I10 Essential (primary) hypertension: Secondary | ICD-10-CM

## 2020-05-26 DIAGNOSIS — Z950 Presence of cardiac pacemaker: Secondary | ICD-10-CM | POA: Diagnosis not present

## 2020-05-29 ENCOUNTER — Encounter: Payer: Self-pay | Admitting: Internal Medicine

## 2020-05-29 NOTE — Assessment & Plan Note (Signed)
Blood pressure is stable on the present drug regimen.  She denies any chest pain nausea vomiting or shortness of breath.

## 2020-05-29 NOTE — Assessment & Plan Note (Signed)
Patient has arthritis of the both knee joint shoulder joint also complaining of back pain.  She has been referred to orthopedic surgeon for evaluation.  Also has been seen by pain management.

## 2020-05-29 NOTE — Progress Notes (Signed)
Established Patient Office Visit  Subjective:  Patient ID: Sue Knapp, female    DOB: 24-Feb-1923  Age: 86 y.o. MRN: [id number]  CC:  Chief Complaint  Patient presents with  . Pacemaker Check    HPI  Sue Knapp presents for pacemaker check and office visit.  Past Medical History:  Diagnosis Date  . Arthritis   . Breast cancer ([place]) 2013   left breast, radiation and lumpectomy  . Cataract   . Depression   . Fatigue   . GERD (gastroesophageal reflux disease)   . Glaucoma   . Heart murmur   . Heart palpitations   . Hiatal hernia   . HOH (hard of hearing)    AIDS  . Hyperlipidemia   . Hypertension   . Hypothyroidism   . Left ankle injury    Old left ankle injury  . Left bundle branch block   . Neuropathy   . Osteoporosis   . Pacemaker   . Palpitations   . Personal history of radiation therapy 2013   left breast ca  . Scoliosis   . Shortness of breath dyspnea   . Thyroid nodule   . TIA (transient ischemic attack)     Past Surgical History:  Procedure Laterality Date  . ABDOMINAL HYSTERECTOMY    . BREAST BIOPSY Left 2005   bengin  . BREAST BIOPSY Left 2013   stereo bx, invasive tubular carcinoma and DCIS  . 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 BREAST SURGERY     LUMPECTOMY  . CATARACT EXTRACTION W/PHACO Right 03/01/2015   Procedure: CATARACT EXTRACTION PHACO AND INTRAOCULAR LENS PLACEMENT (IOC);  Surgeon: Birder Robson, MD;  Location: [facility] ORS;  Service: Ophthalmology;  Laterality: Right;  US:1:03.1 AP:22.8 CDE:14.37  Fluid lot # [id number]  . CATARACT EXTRACTION W/PHACO Left 03/22/2015   Procedure: CATARACT EXTRACTION PHACO AND INTRAOCULAR LENS PLACEMENT (IOC);  Surgeon: Birder Robson, MD;  Location: [facility] ORS;  Service: Ophthalmology;  Laterality: Left;  [place]: 01:01.0 AP%: 22.5% CDE: 13.75 Lot # [id number]  . MASTECTOMY PARTIAL / LUMPECTOMY    . THYROIDECTOMY, PARTIAL       Family History  Problem Relation Age of Onset  . Breast cancer Neg Hx     Social History   Socioeconomic History  . Marital status: Widowed    Spouse name: Not on file  . Number of children: Not on file  . Years of education: Not on file  . Highest education level: Not on file  Occupational History  . Not on file  Tobacco Use  . Smoking status: Never Smoker  . Smokeless tobacco: Never Used  Substance and Sexual Activity  . Alcohol use: No  . Drug use: No  . Sexual activity: Not on file  Other Topics Concern  . Not on file  Social History Narrative  . Not on file   Social Determinants of Health   Financial Resource Strain:   . Difficulty of Paying Living Expenses: Not on file  Food Insecurity:   . Worried About Charity fundraiser in the Last Year: Not on file  . Ran Out of Food in the Last Year: Not on file  Transportation Needs:   . Lack of Transportation (Medical): Not on file  . Lack of Transportation (Non-Medical): Not on file  Physical Activity:   . Days of Exercise per Week: Not on file  . Minutes of Exercise per Session: Not on  file  Stress:   . Feeling of Stress : Not on file  Social Connections:   . Frequency of Communication with Friends and Family: Not on file  . Frequency of Social Gatherings with Friends and Family: Not on file  . Attends Religious Services: Not on file  . Active Member of Clubs or Organizations: Not on file  . Attends Archivist Meetings: Not on file  . Marital Status: Not on file  Intimate Partner Violence:   . Fear of Current or Ex-Partner: Not on file  . Emotionally Abused: Not on file  . Physically Abused: Not on file  . Sexually Abused: Not on file     Current Outpatient Medications:  .  acetaminophen (TYLENOL) 650 MG CR tablet, Take 1,300 mg by mouth 2 (two) times daily., Disp: , Rfl:  .  apixaban (ELIQUIS) 2.5 MG TABS tablet, Take 1 tablet (2.5 mg total) by mouth 2 (two) times daily., Disp: 60 tablet,  Rfl: 6 .  calcium-vitamin D (OSCAL WITH D) 500-200 MG-UNIT tablet, Take 1 tablet by mouth daily with breakfast., Disp: , Rfl:  .  Cholecalciferol 50 MCG (2000 UT) TABS, Take 2,000 Units by mouth daily. , Disp: , Rfl:  .  Cyanocobalamin (VITAMIN B-12 PO), Take 2,500 mcg by mouth daily. , Disp: , Rfl:  .  hydrochlorothiazide (HYDRODIURIL) 25 MG tablet, TAKE 1 TABLET BY MOUTH DAILY, Disp: 30 tablet, Rfl: 6 .  ibuprofen (ADVIL,MOTRIN) 200 MG tablet, Take 200 mg by mouth 2 (two) times daily., Disp: , Rfl:  .  latanoprost (XALATAN) 0.005 % ophthalmic solution, Place 1 drop into both eyes at bedtime., Disp: , Rfl:  .  letrozole (FEMARA) 2.5 MG tablet, TAKE 1 TABLET DAILY, Disp: 90 tablet, Rfl: 0 .  losartan (COZAAR) 100 MG tablet, TAKE ONE TABLET EVERY DAY, Disp: 30 tablet, Rfl: 6 .  metoprolol succinate (TOPROL-XL) 50 MG 24 hr tablet, TAKE ONE TABLET EVERY DAY, Disp: 30 tablet, Rfl: 6 .  Multiple Vitamin (MULTIVITAMIN WITH MINERALS) TABS tablet, Take 1 tablet by mouth daily., Disp: , Rfl:  .  pregabalin (LYRICA) 50 MG capsule, TAKE 1 CAPSULE 3 TIMES DAILY, Disp: 90 capsule, Rfl: 3 .  traMADol (ULTRAM) 50 MG tablet, Take 1 tablet by mouth daily. Patient takes 1/2 tablet at bedtime, Disp: , Rfl:  .  triamcinolone cream (KENALOG) 0.1 %, Apply 1 application topically 2 (two) times daily., Disp: 30 g, Rfl: 0   Allergies  Allergen Reactions  . Codeine Other (See Comments)  . Etodolac Other (See Comments)  . Zolpidem Other (See Comments)    ROS Review of Systems  Constitutional: Positive for fatigue.  HENT: Negative.   Eyes: Negative.   Respiratory: Negative.   Cardiovascular: Negative.   Gastrointestinal: Negative.   Musculoskeletal: Positive for arthralgias, back pain, joint swelling and neck pain.  Psychiatric/Behavioral: Negative.  Negative for confusion.      Objective:    Physical Exam Constitutional:      Appearance: She is normal weight.  HENT:     Head: Normocephalic.      Mouth/Throat:     Mouth: Mucous membranes are moist.  Cardiovascular:     Rate and Rhythm: Normal rate.  Pulmonary:     Effort: Pulmonary effort is normal.  Abdominal:     Palpations: There is no mass.  Musculoskeletal:     Comments: Complain of arthritis of the knee shoulder.  Skin:    General: Skin is warm.  Neurological:  Mental Status: She is alert.     BP 132/61   Pulse 81  Wt Readings from Last 3 Encounters:  05/10/20 139 lb 9.6 oz (63.3 kg)  04/04/20 138 lb 11.2 oz (62.9 kg)  03/03/20 136 lb 12.8 oz (62.1 kg)     Health Maintenance Due  Topic Date Due  . COVID-19 Vaccine (1) Never done  . PNA vac Low Risk Adult (2 of 2 - PCV13) 05/10/2016    There are no preventive care reminders to display for this patient.  Lab Results  Component Value Date   TSH 1.84 05/23/2013   Lab Results  Component Value Date   WBC 6.3 03/03/2020   HGB 12.2 03/03/2020   HCT 37.9 03/03/2020   MCV 92.0 03/03/2020   PLT 272 03/03/2020   Lab Results  Component Value Date   NA 140 09/14/2019   K 3.7 09/14/2019   CO2 28 09/14/2019   GLUCOSE 103 (H) 09/14/2019   BUN 35 (H) 09/14/2019   CREATININE 0.85 09/14/2019   BILITOT 0.6 09/14/2019   ALKPHOS 64 09/14/2019   AST 21 09/14/2019   ALT 13 09/14/2019   PROT 6.7 09/14/2019   ALBUMIN 3.8 09/14/2019   CALCIUM 9.4 09/14/2019   ANIONGAP 10 09/14/2019   Lab Results  Component Value Date   CHOL 214 (H) 04/11/2013   Lab Results  Component Value Date   HDL 65 (H) 04/11/2013   Lab Results  Component Value Date   LDLCALC 131 (H) 04/11/2013   Lab Results  Component Value Date   TRIG 89 04/11/2013   No results found for: CHOLHDL No results found for: HGBA1C    Assessment & Plan:   Problem List Items Addressed This Visit      Cardiovascular and Mediastinum   Essential hypertension    Blood pressure is stable on the present drug regimen.  She denies any chest pain nausea vomiting or shortness of breath.         Musculoskeletal and Integument   Primary osteoarthritis of right knee    Patient has arthritis of the both knee joint shoulder joint also complaining of back pain.  She has been referred to orthopedic surgeon for evaluation.  Also has been seen by pain management.        Other   Cardiac pacemaker - Primary    Estimated longevity of the pacemaker is 2.5 years.  Impedance on the ventricular side is 572      Relevant Orders   PACEMAKER IN CLINIC CHECK (Completed)     Note: Medical Device Follow-up  Patient pacemaker was interrogated by pacemakers analyzer, battery status is okay.  No programming changes were indicated after the review of the data.  Histogram shows no change since the last interrogation Atrial and ventricular sensing thresholds were found to be acceptable Impedance was checked and it was found to be normal.  Thresholds were found to be okay on evaluation of rhythm problem.  No high rate or low rate arrhythmia were noted.  Estimated battery longevity is 3 yrs. I have personally reviewed the device data and amended the report as necessary.  No orders of the defined types were placed in this encounter.   Follow-up: No follow-ups on file.    Cletis Athens, MD

## 2020-05-29 NOTE — Assessment & Plan Note (Signed)
Estimated longevity of the pacemaker is 2.5 years.  Impedance on the ventricular side is 572

## 2020-06-15 ENCOUNTER — Other Ambulatory Visit: Payer: Self-pay | Admitting: Internal Medicine

## 2020-06-15 DIAGNOSIS — G894 Chronic pain syndrome: Secondary | ICD-10-CM

## 2020-06-23 ENCOUNTER — Other Ambulatory Visit: Payer: Self-pay | Admitting: Internal Medicine

## 2020-06-23 DIAGNOSIS — G894 Chronic pain syndrome: Secondary | ICD-10-CM

## 2020-07-11 ENCOUNTER — Other Ambulatory Visit: Payer: Self-pay | Admitting: Internal Medicine

## 2020-08-02 ENCOUNTER — Other Ambulatory Visit: Payer: Self-pay | Admitting: Internal Medicine

## 2020-08-22 ENCOUNTER — Other Ambulatory Visit: Payer: Self-pay

## 2020-08-29 ENCOUNTER — Ambulatory Visit (INDEPENDENT_AMBULATORY_CARE_PROVIDER_SITE_OTHER): Payer: Medicare Other | Admitting: Internal Medicine

## 2020-08-29 ENCOUNTER — Other Ambulatory Visit: Payer: Self-pay

## 2020-08-29 VITALS — BP 175/83 | HR 81

## 2020-08-29 DIAGNOSIS — G894 Chronic pain syndrome: Secondary | ICD-10-CM | POA: Diagnosis not present

## 2020-08-29 DIAGNOSIS — M12812 Other specific arthropathies, not elsewhere classified, left shoulder: Secondary | ICD-10-CM

## 2020-08-29 DIAGNOSIS — I495 Sick sinus syndrome: Secondary | ICD-10-CM

## 2020-08-29 DIAGNOSIS — I10 Essential (primary) hypertension: Secondary | ICD-10-CM | POA: Diagnosis not present

## 2020-08-29 DIAGNOSIS — Z950 Presence of cardiac pacemaker: Secondary | ICD-10-CM | POA: Diagnosis not present

## 2020-09-02 NOTE — Assessment & Plan Note (Signed)
Ref to ortho

## 2020-09-02 NOTE — Assessment & Plan Note (Signed)
Working  well

## 2020-09-02 NOTE — Progress Notes (Signed)
Established Patient Office Visit  Subjective:  Patient ID: Sue Knapp, female    DOB: 04-26-1923  Age: 87 y.o. MRN: [id number]  CC: No chief complaint on file.   HPI  Sue Knapp presents for pacer check  And myalgia and jt arthiritis  Past Medical History:  Diagnosis Date  . Arthritis   . Breast cancer ([place]) 2013   left breast, radiation and lumpectomy  . Cataract   . Depression   . Fatigue   . GERD (gastroesophageal reflux disease)   . Glaucoma   . Heart murmur   . Heart palpitations   . Hiatal hernia   . HOH (hard of hearing)    AIDS  . Hyperlipidemia   . Hypertension   . Hypothyroidism   . Left ankle injury    Old left ankle injury  . Left bundle branch block   . Neuropathy   . Osteoporosis   . Pacemaker   . Palpitations   . Personal history of radiation therapy 2013   left breast ca  . Scoliosis   . Shortness of breath dyspnea   . Thyroid nodule   . TIA (transient ischemic attack)     Past Surgical History:  Procedure Laterality Date  . ABDOMINAL HYSTERECTOMY    . BREAST BIOPSY Left 2005   b[place]  . BREAST BIOPSY Left 2013   stereo bx, invasive tubular carcinoma and DCIS  . 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 BREAST SURGERY     LUMPECTOMY  . CATARACT EXTRACTION W/PHACO Right 03/01/2015   Procedure: CATARACT EXTRACTION PHACO AND INTRAOCULAR LENS PLACEMENT (IOC);  Surgeon: Birder Robson, MD;  Location: [facility] ORS;  Service: Ophthalmology;  Laterality: Right;  US:1:03.1 AP:22.8 CDE:14.37  Fluid lot # [id number]  . CATARACT EXTRACTION W/PHACO Left 03/22/2015   Procedure: CATARACT EXTRACTION PHACO AND INTRAOCULAR LENS PLACEMENT (IOC);  Surgeon: Birder Robson, MD;  Location: [facility] ORS;  Service: Ophthalmology;  Laterality: Left;  [place]: 01:01.0 AP%: 22.5% CDE: 13.75 Lot # [id number]  . MASTECTOMY PARTIAL / LUMPECTOMY    . THYROIDECTOMY, PARTIAL      Family History  Problem  Relation Age of Onset  . Breast cancer Neg Hx     Social History   Socioeconomic History  . Marital status: Widowed    Spouse name: Not on file  . Number of children: Not on file  . Years of education: Not on file  . Highest education level: Not on file  Occupational History  . Not on file  Tobacco Use  . Smoking status: Never Smoker  . Smokeless tobacco: Never Used  Substance and Sexual Activity  . Alcohol use: No  . Drug use: No  . Sexual activity: Not on file  Other Topics Concern  . Not on file  Social History Narrative  . Not on file   Social Determinants of Health   Financial Resource Strain: Not on file  Food Insecurity: Not on file  Transportation Needs: Not on file  Physical Activity: Not on file  Stress: Not on file  Social Connections: Not on file  Intimate Partner Violence: Not on file     Current Outpatient Medications:  .  acetaminophen (TYLENOL) 650 MG CR tablet, Take 1,300 mg by mouth 2 (two) times daily., Disp: , Rfl:  .  apixaban (ELIQUIS) 2.5 MG TABS tablet, Take 1 tablet (2.5 mg total) by mouth 2 (two) times daily., Disp: 60 tablet, Rfl:  6 .  calcium-vitamin D (OSCAL WITH D) 500-200 MG-UNIT tablet, Take 1 tablet by mouth daily with breakfast., Disp: , Rfl:  .  Cholecalciferol 50 MCG (2000 UT) TABS, Take 2,000 Units by mouth daily. , Disp: , Rfl:  .  Cyanocobalamin (VITAMIN B-12 PO), Take 2,500 mcg by mouth daily. , Disp: , Rfl:  .  hydrochlorothiazide (HYDRODIURIL) 25 MG tablet, TAKE 1 TABLET BY MOUTH DAILY, Disp: 30 tablet, Rfl: 6 .  ibuprofen (ADVIL,MOTRIN) 200 MG tablet, Take 200 mg by mouth 2 (two) times daily., Disp: , Rfl:  .  latanoprost (XALATAN) 0.005 % ophthalmic solution, Place 1 drop into both eyes at bedtime., Disp: , Rfl:  .  letrozole (FEMARA) 2.5 MG tablet, TAKE 1 TABLET DAILY, Disp: 90 tablet, Rfl: 0 .  losartan (COZAAR) 100 MG tablet, TAKE ONE TABLET EVERY DAY, Disp: 90 tablet, Rfl: 1 .  metoprolol succinate (TOPROL-XL) 50 MG 24  hr tablet, TAKE 1 TABLET BY MOUTH DAILY, Disp: 30 tablet, Rfl: 6 .  Multiple Vitamin (MULTIVITAMIN WITH MINERALS) TABS tablet, Take 1 tablet by mouth daily., Disp: , Rfl:  .  pregabalin (LYRICA) 50 MG capsule, TAKE 1 CAPSULE 3 TIMES DAILY, Disp: 90 capsule, Rfl: 3 .  traMADol (ULTRAM) 50 MG tablet, Take 1 tablet by mouth daily. Patient takes 1/2 tablet at bedtime, Disp: , Rfl:  .  triamcinolone cream (KENALOG) 0.1 %, Apply 1 application topically 2 (two) times daily., Disp: 30 g, Rfl: 0   Allergies  Allergen Reactions  . Codeine Other (See Comments)  . Etodolac Other (See Comments)  . Zolpidem Other (See Comments)    ROS Review of Systems  Constitutional: Positive for fatigue.  HENT: Negative.  Negative for ear pain.   Eyes: Negative.   Respiratory: Negative.   Cardiovascular: Negative.   Gastrointestinal: Negative.   Endocrine: Negative.   Genitourinary: Negative.   Musculoskeletal: Negative.        Arthiritis  , muscle ache  Skin: Negative.   Allergic/Immunologic: Negative.   Neurological: Negative.   Hematological: Negative.   Psychiatric/Behavioral: Negative.   All other systems reviewed and are negative.     Objective:    Physical Exam/ c/o multile jt pain and arthiritis  BP (!) 175/83   Pulse 81  Wt Readings from Last 3 Encounters:  05/10/20 139 lb 9.6 oz (63.3 kg)  04/04/20 138 lb 11.2 oz (62.9 kg)  03/03/20 136 lb 12.8 oz (62.1 kg)     Health Maintenance Due  Topic Date Due  . COVID-19 Vaccine (1) Never done  . PNA vac Low Risk Adult (2 of 2 - PCV13) 05/10/2016    There are no preventive care reminders to display for this patient.  Lab Results  Component Value Date   TSH 1.84 05/23/2013   Lab Results  Component Value Date   WBC 6.3 03/03/2020   HGB 12.2 03/03/2020   HCT 37.9 03/03/2020   MCV 92.0 03/03/2020   PLT 272 03/03/2020   Lab Results  Component Value Date   NA 140 09/14/2019   K 3.7 09/14/2019   CO2 28 09/14/2019   GLUCOSE 103  (H) 09/14/2019   BUN 35 (H) 09/14/2019   CREATININE 0.85 09/14/2019   BILITOT 0.6 09/14/2019   ALKPHOS 64 09/14/2019   AST 21 09/14/2019   ALT 13 09/14/2019   PROT 6.7 09/14/2019   ALBUMIN 3.8 09/14/2019   CALCIUM 9.4 09/14/2019   ANIONGAP 10 09/14/2019   Lab Results  Component Value Date  CHOL 214 (H) 04/11/2013   Lab Results  Component Value Date   HDL 65 (H) 04/11/2013   Lab Results  Component Value Date   LDLCALC 131 (H) 04/11/2013   Lab Results  Component Value Date   TRIG 89 04/11/2013   No results found for: CHOLHDL No results found for: HGBA1C    Assessment & Plan:   Problem List Items Addressed This Visit      Cardiovascular and Mediastinum   Sick sinus syndrome due to SA node dysfunction (HCC)    No arrythmia      Essential hypertension    - Today, the patient's blood pressure is well managed on med.   - I encouraged the patient to eat a low-sodium diet to help control blood pressure. - I encouraged the patient to live an active lifestyle and complete activities that increases heart rate to 85% target heart rate at least 5 times per week for one hour.           Musculoskeletal and Integument   Rotator cuff arthropathy of left shoulder    Ref to ortho        Other   Chronic pain syndrome   Pacemaker - Primary    Working  well      Relevant Orders   PACEMAKER IN Personnel officer (Completed)    Note: Medical Device Follow-up  Patient pacemaker was interrogated by pacemakers analyzer, battery status is okay.  No programming changes were indicated after the review of the data.  Histogram shows no change since the last interrogation Atrial and ventricular sensing thresholds were found to be acceptable Impedance was checked and it was found to be normal.  Thresholds were found to be okay on evaluation of rhythm problem.  No high rate or low rate arrhythmia were noted.  Estimated battery longevity is 29 months. I have personally reviewed the  device data and amended the report as necessary.   No orders of the defined types were placed in this encounter.   Follow-up: No follow-ups on file.    Cletis Athens, MD

## 2020-09-02 NOTE — Assessment & Plan Note (Signed)
No arrythmia

## 2020-09-02 NOTE — Assessment & Plan Note (Signed)
-   Today, the patient's blood pressure is well managed on med.   - I encouraged the patient to eat a low-sodium diet to help control blood pressure. - I encouraged the patient to live an active lifestyle and complete activities that increases heart rate to 85% target heart rate at least 5 times per week for one hour.

## 2020-09-09 ENCOUNTER — Other Ambulatory Visit: Payer: Self-pay | Admitting: *Deleted

## 2020-09-09 MED ORDER — AZITHROMYCIN 250 MG PO TABS: ORAL_TABLET | ORAL | 0 refills | Status: DC

## 2020-09-09 MED ORDER — ALBUTEROL SULFATE HFA 108 (90 BASE) MCG/ACT IN AERS: 2.0000 | INHALATION_SPRAY | Freq: Four times a day (QID) | RESPIRATORY_TRACT | 4 refills | Status: DC | PRN

## 2020-09-12 ENCOUNTER — Other Ambulatory Visit: Payer: Self-pay | Admitting: *Deleted

## 2020-09-13 ENCOUNTER — Ambulatory Visit: Payer: Medicare Other | Admitting: Student in an Organized Health Care Education/Training Program

## 2020-09-15 ENCOUNTER — Other Ambulatory Visit: Payer: Self-pay | Admitting: Internal Medicine

## 2020-10-11 ENCOUNTER — Other Ambulatory Visit: Payer: Self-pay | Admitting: Internal Medicine

## 2020-10-11 DIAGNOSIS — G894 Chronic pain syndrome: Secondary | ICD-10-CM

## 2020-10-13 ENCOUNTER — Ambulatory Visit: Payer: Medicare Other | Admitting: Student in an Organized Health Care Education/Training Program

## 2020-10-25 ENCOUNTER — Emergency Department: Payer: Medicare Other

## 2020-10-25 ENCOUNTER — Other Ambulatory Visit: Payer: Self-pay

## 2020-10-25 ENCOUNTER — Emergency Department
Admission: EM | Admit: 2020-10-25 | Discharge: 2020-10-25 | Disposition: A | Discharge status: Home / Self Care | Payer: Medicare Other | Attending: Emergency Medicine | Admitting: Emergency Medicine

## 2020-10-25 DIAGNOSIS — R42 Dizziness and giddiness: Secondary | ICD-10-CM | POA: Diagnosis not present

## 2020-10-25 DIAGNOSIS — I509 Heart failure, unspecified: Secondary | ICD-10-CM | POA: Insufficient documentation

## 2020-10-25 DIAGNOSIS — Z853 Personal history of malignant neoplasm of breast: Secondary | ICD-10-CM | POA: Diagnosis not present

## 2020-10-25 DIAGNOSIS — R0602 Shortness of breath: Secondary | ICD-10-CM | POA: Diagnosis present

## 2020-10-25 DIAGNOSIS — N3000 Acute cystitis without hematuria: Secondary | ICD-10-CM | POA: Insufficient documentation

## 2020-10-25 DIAGNOSIS — E039 Hypothyroidism, unspecified: Secondary | ICD-10-CM | POA: Insufficient documentation

## 2020-10-25 DIAGNOSIS — Z7901 Long term (current) use of anticoagulants: Secondary | ICD-10-CM | POA: Insufficient documentation

## 2020-10-25 DIAGNOSIS — I110 Hypertensive heart disease with heart failure: Secondary | ICD-10-CM | POA: Insufficient documentation

## 2020-10-25 DIAGNOSIS — Z950 Presence of cardiac pacemaker: Secondary | ICD-10-CM | POA: Insufficient documentation

## 2020-10-25 DIAGNOSIS — Z79899 Other long term (current) drug therapy: Secondary | ICD-10-CM | POA: Diagnosis not present

## 2020-10-25 DIAGNOSIS — R1310 Dysphagia, unspecified: Secondary | ICD-10-CM | POA: Insufficient documentation

## 2020-10-25 LAB — COMPREHENSIVE METABOLIC PANEL
ALT: 68 U/L — ABNORMAL HIGH (ref 0–44)
AST: 50 U/L — ABNORMAL HIGH (ref 15–41)
Albumin: 3.9 g/dL (ref 3.5–5.0)
Alkaline Phosphatase: 84 U/L (ref 38–126)
Anion gap: 9 (ref 5–15)
BUN: 46 mg/dL — ABNORMAL HIGH (ref 8–23)
CO2: 25 mmol/L (ref 22–32)
Calcium: 9.3 mg/dL (ref 8.9–10.3)
Chloride: 107 mmol/L (ref 98–111)
Creatinine, Ser: 1 mg/dL (ref 0.44–1.00)
GFR, Estimated: 52 mL/min — ABNORMAL LOW (ref >60)
Glucose, Bld: 108 mg/dL — ABNORMAL HIGH (ref 70–99)
Potassium: 4 mmol/L (ref 3.5–5.1)
Sodium: 141 mmol/L (ref 135–145)
Total Bilirubin: 1 mg/dL (ref 0.3–1.2)
Total Protein: 6.5 g/dL (ref 6.5–8.1)

## 2020-10-25 LAB — CBC
HCT: 34.2 % — ABNORMAL LOW (ref 36.0–46.0)
Hemoglobin: 10.9 g/dL — ABNORMAL LOW (ref 12.0–15.0)
MCH: 28.6 pg (ref 26.0–34.0)
MCHC: 31.9 g/dL (ref 30.0–36.0)
MCV: 89.8 fL (ref 80.0–100.0)
Platelets: 261 10*3/uL (ref 150–400)
RBC: 3.81 MIL/uL — ABNORMAL LOW (ref 3.87–5.11)
RDW: 17.1 % — ABNORMAL HIGH (ref 11.5–15.5)
WBC: 6.7 10*3/uL (ref 4.0–10.5)
nRBC: 0 % (ref 0.0–0.2)

## 2020-10-25 LAB — BRAIN NATRIURETIC PEPTIDE: B Natriuretic Peptide: 681.4 pg/mL — ABNORMAL HIGH (ref 0.0–100.0)

## 2020-10-25 LAB — DIFFERENTIAL
Abs Immature Granulocytes: 0.04 10*3/uL (ref 0.00–0.07)
Basophils Absolute: 0.1 10*3/uL (ref 0.0–0.1)
Basophils Relative: 1 %
Eosinophils Absolute: 0 10*3/uL (ref 0.0–0.5)
Eosinophils Relative: 1 %
Immature Granulocytes: 1 %
Lymphocytes Relative: 12 %
Lymphs Abs: 0.8 10*3/uL (ref 0.7–4.0)
Monocytes Absolute: 0.5 10*3/uL (ref 0.1–1.0)
Monocytes Relative: 7 %
Neutro Abs: 5.3 10*3/uL (ref 1.7–7.7)
Neutrophils Relative %: 78 %

## 2020-10-25 LAB — PROTIME-INR
INR: 1.2 (ref 0.8–1.2)
Prothrombin Time: 15.2 seconds (ref 11.4–15.2)

## 2020-10-25 LAB — URINALYSIS, COMPLETE (UACMP) WITH MICROSCOPIC
Bacteria, UA: NONE SEEN
Bilirubin Urine: NEGATIVE
Glucose, UA: NEGATIVE mg/dL
Hgb urine dipstick: NEGATIVE
Ketones, ur: 5 mg/dL — AB
Nitrite: NEGATIVE
Protein, ur: NEGATIVE mg/dL
Specific Gravity, Urine: >1.046 — ABNORMAL HIGH (ref 1.005–1.030)
pH: 5 (ref 5.0–8.0)

## 2020-10-25 LAB — APTT: aPTT: 35 seconds (ref 24–36)

## 2020-10-25 LAB — TROPONIN I (HIGH SENSITIVITY): Troponin I (High Sensitivity): 11 ng/L (ref <18)

## 2020-10-25 MED ORDER — FUROSEMIDE 10 MG/ML IJ SOLN
40.0000 mg | Freq: Once | INTRAMUSCULAR | Status: AC
  Administered 2020-10-25: 40 mg via INTRAVENOUS
  Filled 2020-10-25: qty 4

## 2020-10-25 MED ORDER — IOHEXOL 350 MG/ML SOLN
75.0000 mL | Freq: Once | INTRAVENOUS | Status: AC | PRN
  Administered 2020-10-25: 75 mL via INTRAVENOUS
  Filled 2020-10-25: qty 75

## 2020-10-25 MED ORDER — FUROSEMIDE 20 MG PO TABS: 20.0000 mg | ORAL_TABLET | Freq: Every day | ORAL | 0 refills | Status: DC

## 2020-10-25 MED ORDER — SODIUM CHLORIDE 0.9% FLUSH
3.0000 mL | Freq: Once | INTRAVENOUS | Status: DC
Start: 2020-10-25 — End: 2020-10-25

## 2020-10-25 MED ORDER — NITROFURANTOIN MONOHYD MACRO 100 MG PO CAPS: 100.0000 mg | ORAL_CAPSULE | Freq: Two times a day (BID) | ORAL | 0 refills | Status: AC

## 2020-10-25 NOTE — ED Notes (Signed)
MD in room at this time to re-assess patient.

## 2020-10-25 NOTE — ED Triage Notes (Addendum)
Pt comes from  [place] with c/o SOB, dizziness and left sided pain. Pt states this all started few days ago. Pt states she just feels like she can't fill her lungs.  Pt states she feels flushed, lightheaded and her vision is blurred.  Pt states the blurry vision started sometime this am pt unsure what time. Pt states she feels like their is  Film over her eyes. Pt states last week she had surgery on her eyes.  Mini neuro negative, VAN negative. Pt does have some swelling to left lower leg.

## 2020-10-25 NOTE — ED Notes (Signed)
See triage note, pt reports shob that started this morning where she feels like she cant get a deep breath

## 2020-10-25 NOTE — ED Notes (Signed)
This RN placed patient on 2L, [place] for comfort at this time.

## 2020-10-25 NOTE — ED Provider Notes (Signed)
[facility] Emergency Department Provider Note  ____________________________________________   Event Date/Time   First MD Initiated Contact with Patient 10/25/20 1309     (approximate)  I have reviewed the triage vital signs and the nursing notes.   HISTORY  Chief Complaint Shortness of Breath   HPI Sue Knapp is a 87 y.o. female a past medical history of arthritis, GERD, depression, HTN, HDL, hypothyroidism, TIA and chronic pain secondary to fairly advanced arthritis in all extremities who presents for assessment of multiple complaints.  Seems patient is most bothered today by some shortness of breath and feeling like she cannot take a full breath.  She also states she has been feeling some discomfort in her left lower quadrant of her abdomen over the last 3 to 4 days.  She states she has also been feeling a little more lightheaded than usual.  She states that often she will have a feeling of unable to catch her breath in the mornings but typically this will go away after she coughs and clears her throat feels that it did not go away completely today.  She also states she has a history of diverticulosis and feels that her pain in her left lower quadrant may be related to this although is not sure.  She denies any headache, vertigo, vision changes, chest pain, change in chronic morning cough, fevers, chills, back pain, other abdominal pain, urinary symptoms, rash or focal extremity weakness numbness or acute pain.  No recent falls or injuries.  She is not on any blood thinner.          Past Medical History:  Diagnosis Date  . Arthritis   . Breast cancer ([place]) 2013   left breast, radiation and lumpectomy  . Cataract   . Depression   . Fatigue   . GERD (gastroesophageal reflux disease)   . Glaucoma   . Heart murmur   . Heart palpitations   . Hiatal hernia   . HOH (hard of hearing)    AIDS  . Hyperlipidemia   . Hypertension   . Hypothyroidism    . Left ankle injury    Old left ankle injury  . Left bundle branch block   . Neuropathy   . Osteoporosis   . Pacemaker   . Palpitations   . Personal history of radiation therapy 2013   left breast ca  . Scoliosis   . Shortness of breath dyspnea   . Thyroid nodule   . TIA (transient ischemic attack)     Patient Active Problem List   Diagnosis Date Noted  . MP (metacarpophalangeal) joint sprain, initial encounter 04/04/2020  . Dermatitis 04/04/2020  . Rotator cuff arthropathy of left shoulder 03/03/2020  . Annual physical exam 02/10/2020  . Hx of breast cancer 12/11/2019  . Essential hypertension 12/10/2019  . Pacemaker 11/26/2019  . Sick sinus syndrome due to SA node dysfunction ([place]) 11/26/2019  . Primary osteoarthritis of right knee 03/03/2019  . Primary osteoarthritis of left knee 03/03/2019  . Bilateral primary osteoarthritis of knee 03/03/2019  . Osteoarthritis of right shoulder 03/03/2019  . Chronic pain syndrome 03/03/2019    Past Surgical History:  Procedure Laterality Date  . ABDOMINAL HYSTERECTOMY    . BREAST BIOPSY Left 2005   [place]  . BREAST BIOPSY Left 2013   stereo bx, invasive tubular carcinoma and DCIS  . 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 BREAST SURGERY  LUMPECTOMY  . CATARACT EXTRACTION W/PHACO Right 03/01/2015   Procedure: CATARACT EXTRACTION PHACO AND INTRAOCULAR LENS PLACEMENT (IOC);  Surgeon: Birder Robson, MD;  Location: [facility] ORS;  Service: Ophthalmology;  Laterality: Right;  US:1:03.1 AP:22.8 CDE:14.37  Fluid lot # [id number]  . CATARACT EXTRACTION W/PHACO Left 03/22/2015   Procedure: CATARACT EXTRACTION PHACO AND INTRAOCULAR LENS PLACEMENT (IOC);  Surgeon: Birder Robson, MD;  Location: [facility] ORS;  Service: Ophthalmology;  Laterality: Left;  [place]: 01:01.0 AP%: 22.5% CDE: 13.75 Lot # [id number]  . MASTECTOMY PARTIAL / LUMPECTOMY    . THYROIDECTOMY, PARTIAL      Prior to  Admission medications   Medication Sig Start Date End Date Taking? Authorizing Provider  furosemide (LASIX) 20 MG tablet Take 1 tablet (20 mg total) by mouth daily for 7 days. 10/25/20 11/01/20 Yes Lucrezia Starch, MD  nitrofurantoin, macrocrystal-monohydrate, (MACROBID) 100 MG capsule Take 1 capsule (100 mg total) by mouth 2 (two) times daily for 5 days. 10/25/20 10/30/20 Yes Lucrezia Starch, MD  acetaminophen (TYLENOL) 650 MG CR tablet Take 1,300 mg by mouth 2 (two) times daily.    [provider]  albuterol (VENTOLIN HFA) 108 (90 Base) MCG/ACT inhaler Inhale 2 puffs into the lungs every 6 (six) hours as needed for wheezing or shortness of breath. 09/09/20   Beckie Salts, FNP  apixaban (ELIQUIS) 2.5 MG TABS tablet Take 1 tablet (2.5 mg total) by mouth 2 (two) times daily. 02/25/20   Cletis Athens, MD  azithromycin (ZITHROMAX Z-PAK) 250 MG tablet As directed 09/09/20   Beckie Salts, FNP  calcium-vitamin D (OSCAL WITH D) 500-200 MG-UNIT tablet Take 1 tablet by mouth daily with breakfast.    [provider]  Cholecalciferol 50 MCG (2000 UT) TABS Take 2,000 Units by mouth daily.     [provider]  Cyanocobalamin (VITAMIN B-12 PO) Take 2,500 mcg by mouth daily.     [provider]  hydrochlorothiazide (HYDRODIURIL) 25 MG tablet TAKE 1 TABLET BY MOUTH DAILY 09/15/20   Beckie Salts, FNP  ibuprofen (ADVIL,MOTRIN) 200 MG tablet Take 200 mg by mouth 2 (two) times daily.    [provider]  latanoprost (XALATAN) 0.005 % ophthalmic solution Place 1 drop into both eyes at bedtime.    [provider]  letrozole [facility]) 2.5 MG tablet TAKE 1 TABLET DAILY 03/14/17   Lloyd Huger, MD  losartan (COZAAR) 100 MG tablet TAKE ONE TABLET EVERY DAY 08/02/20   Cletis Athens, MD  metoprolol succinate (TOPROL-XL) 50 MG 24 hr tablet TAKE 1 TABLET BY MOUTH DAILY 07/11/20   Cletis Athens, MD  Multiple Vitamin (MULTIVITAMIN WITH MINERALS) TABS tablet Take 1 tablet by  mouth daily.    [provider]  pregabalin (LYRICA) 50 MG capsule TAKE 1 CAPSULE 3 TIMES DAILY 10/11/20   Cletis Athens, MD  traMADol (ULTRAM) 50 MG tablet Take 1 tablet by mouth daily. Patient takes 1/2 tablet at bedtime 06/01/19   [provider]  triamcinolone cream (KENALOG) 0.1 % Apply 1 application topically 2 (two) times daily. 04/04/20   Cletis Athens, MD    Allergies Codeine, Etodolac, and Zolpidem  Family History  Problem Relation Age of Onset  . Breast cancer Neg Hx     Social History Social History   Tobacco Use  . Smoking status: Never Smoker  . Smokeless tobacco: Never Used  Substance Use Topics  . Alcohol use: No  . Drug use: No    Review of Systems  Review of Systems  Constitutional:  Negative for chills and fever.  HENT: Negative for sore throat.   Eyes: Negative for pain.  Respiratory: Positive for cough ( chronica, usually only upon waking) and shortness of breath. Negative for stridor.   Cardiovascular: Negative for chest pain.  Gastrointestinal: Positive for abdominal pain. Negative for vomiting.  Genitourinary: Negative for dysuria.  Musculoskeletal: Positive for back pain ( chronic) and joint pain ( chronic in b/l shoulders, elbows, wrists, hips, knees, ankles).  Skin: Negative for rash.  Neurological: Positive for dizziness. Negative for seizures, loss of consciousness and headaches.  Psychiatric/Behavioral: Negative for suicidal ideas.  All other systems reviewed and are negative.     ____________________________________________   PHYSICAL EXAM:  VITAL SIGNS: ED Triage Vitals  Enc Vitals Group     BP 10/25/20 1233 (!) 163/66     Pulse Rate 10/25/20 1233 (!) 59     Resp 10/25/20 1233 18     Temp 10/25/20 1233 97.8 F (36.6 C)     Temp Source 10/25/20 1233 Oral     SpO2 10/25/20 1233 100 %     Weight 10/25/20 1237 138 lb (62.6 kg)     Height 10/25/20 1237 5' (1.524 m)     Head Circumference --      Peak Flow --       Pain Score 10/25/20 1237 4     Pain Loc --      Pain Edu? --      Excl. in [place]? --    Vitals:   10/25/20 1233  BP: (!) 163/66  Pulse: (!) 59  Resp: 18  Temp: 97.8 F (36.6 C)  SpO2: 100%   Physical Exam Vitals and nursing note reviewed.  Constitutional:      General: She is not in acute distress.    Appearance: She is well-developed.  HENT:     Head: Normocephalic and atraumatic.     Right Ear: External ear normal.     Left Ear: External ear normal.     Nose: Nose normal.  Eyes:     Conjunctiva/sclera: Conjunctivae normal.  Cardiovascular:     Rate and Rhythm: Normal rate and regular rhythm.     Pulses: Normal pulses.     Heart sounds: No murmur heard.   Pulmonary:     Effort: Pulmonary effort is normal. No respiratory distress.     Breath sounds: Normal breath sounds.  Abdominal:     Palpations: Abdomen is soft.     Tenderness: There is no abdominal tenderness. There is no right CVA tenderness or left CVA tenderness.  Musculoskeletal:     Cervical back: Neck supple.  Skin:    General: Skin is warm and dry.     Capillary Refill: Capillary refill takes less than 2 seconds.  Neurological:     Mental Status: She is alert.  Psychiatric:        Mood and Affect: Mood normal.     Cranial nerves II through XII grossly intact.  No pronator drift.  No finger dysmetria.  Symmetric 5/5 strength of all extremities.  Sensation intact to light touch in all extremities.  Unremarkable unassisted gait.  ____________________________________________   LABS (all labs ordered are listed, but only abnormal results are displayed)  Labs Reviewed  CBC - Abnormal; Notable for the following components:      Result Value   RBC 3.81 (*)    Hemoglobin 10.9 (*)    HCT 34.2 (*)    RDW 17.1 (*)  All other components within normal limits  COMPREHENSIVE METABOLIC PANEL - Abnormal; Notable for the following components:   Glucose, Bld 108 (*)    BUN 46 (*)    AST 50 (*)    ALT 68 (*)     GFR, Estimated 52 (*)    All other components within normal limits  URINALYSIS, COMPLETE (UACMP) WITH MICROSCOPIC - Abnormal; Notable for the following components:   Color, Urine YELLOW (*)    APPearance CLEAR (*)    Specific Gravity, Urine >1.046 (*)    Ketones, ur 5 (*)    Leukocytes,Ua TRACE (*)    All other components within normal limits  BRAIN NATRIURETIC PEPTIDE - Abnormal; Notable for the following components:   B Natriuretic Peptide 681.4 (*)    All other components within normal limits  URINE CULTURE  PROTIME-INR  APTT  DIFFERENTIAL  CBG MONITORING, ED  TROPONIN I (HIGH SENSITIVITY)   ____________________________________________  EKG  Ventricular paced rhythm with a ventricular rate of 60, otherwise some evidence of flutter.  No other clear acute changes.  Is Q waves in versions and aVL and nonspecific changes in 1 and lateral leads are seen in prior EKG on 2/23. ____________________________________________  RADIOLOGY  ED MD interpretation: Chest x-ray shows some cardiomegaly with mild venous congestion without full consolidation pneumothorax or other clear acute intrathoracic process.  CT head is unremarkable.  CTA chest shows no evidence of PE pneumonia but does show evidence of some atelectasis and pulmonary edema with small bilateral pleural effusions.  CT abdomen pelvis shows some colonic diverticuli without any evidence of diverticulitis or other acute abdominopelvic process.  No evidence of kidney stone, pyelonephritis or any other clear cause of patient's lower abdominal pain at this time.  Official radiology report(s): DG Chest 1 View  Result Date: 10/25/2020 CLINICAL DATA:  Shortness of breath. EXAM: CHEST  1 VIEW COMPARISON:  CT 10/25/2020.  Chest x-ray 09/14/2019. FINDINGS: Cardiac pacer with lead tip over the right atrium right ventricle. Cardiomegaly with mild pulmonary venous congestion. Bilateral interstitial prominence. Findings consistent with mild CHF.  Pneumonitis cannot be excluded. No prominent pleural effusion. No pneumothorax. Degenerative changes both shoulders. Degenerative changes scoliosis thoracic spine. IMPRESSION: Cardiac pacer with lead tips over the right atrium right ventricle. Cardiomegaly with mild pulmonary venous congestion and bilateral interstitial prominence. Most consistent with mild CHF. Electronically Signed   By: Marcello Moores  Register   On: 10/25/2020 14:36   CT HEAD WO CONTRAST  Result Date: 10/25/2020 CLINICAL DATA:  Dizziness. EXAM: CT HEAD WITHOUT CONTRAST TECHNIQUE: Contiguous axial images were obtained from the base of the skull through the vertex without intravenous contrast. COMPARISON:  September 14, 2019. FINDINGS: Brain: Mild chronic ischemic white matter disease is noted. Mild diffuse cortical atrophy is noted. No mass effect or midline shift is noted. Ventricular size is within normal limits. There is no evidence of mass lesion, hemorrhage or acute infarction. Vascular: No hyperdense vessel or unexpected calcification. Skull: Normal. Negative for fracture or focal lesion. Sinuses/Orbits: No acute finding. Other: None. IMPRESSION: No acute intracranial abnormality seen. Electronically Signed   By: Marijo Conception M.D.   On: 10/25/2020 14:23   CT Angio Chest PE W and/or Wo Contrast  Result Date: 10/25/2020 CLINICAL DATA:  Shortness of breath. Nonlocalized acute abdominal pain. EXAM: CT ANGIOGRAPHY CHEST CT ABDOMEN AND PELVIS WITH CONTRAST TECHNIQUE: Multidetector CT imaging of the chest was performed using the standard protocol during bolus administration of intravenous contrast. Multiplanar CT image reconstructions and  MIPs were obtained to evaluate the vascular anatomy. Multidetector CT imaging of the abdomen and pelvis was performed using the standard protocol during bolus administration of intravenous contrast. CONTRAST:  15mL OMNIPAQUE IOHEXOL 350 MG/ML SOLN COMPARISON:  Abdomen/pelvis CT 09/14/2019.  Chest CT 05/26/2013  FINDINGS: CTA CHEST FINDINGS Cardiovascular: The heart is enlarged. No substantial pericardial effusion. Coronary artery calcification is evident. Atherosclerotic calcification is noted in the wall of the thoracic aorta. Left permanent pacemaker noted. There is no filling defect within the opacified pulmonary arteries to suggest the presence of an acute pulmonary embolus. Mediastinum/Nodes: No mediastinal lymphadenopathy. 2.7 cm right thyroid nodule has increased from 2.0 cm on 05/26/2013. No evidence for gross hilar lymphadenopathy although assessment is limited by the lack of intravenous contrast on today's study. The esophagus has normal imaging features. There is no axillary lymphadenopathy. Lungs/Pleura: Patchy areas of subtle ground-glass attenuation are noted in the lungs bilaterally with subsegmental atelectasis noted in the lingula. Small bilateral pleural effusions noted. Bandlike scarring noted medial left lower lobe with associated volume loss. No overtly suspicious pulmonary nodule or mass. Musculoskeletal: No worrisome lytic or sclerotic osseous abnormality. Degenerative changes are noted in both shoulders Review of the MIP images confirms the above findings. CT ABDOMEN and PELVIS FINDINGS Hepatobiliary: Stable small cyst in the dome of the left liver. Cyst noted previously in the inferior right hepatic lobe along the gallbladder fossa shows evidence of interval involution. 4.3 cm area of subcapsular hyperenhancement noted and posterior segment II, stable since study from 1 year ago, likely transient hepatic attenuation difference, potentially related to vascular anomaly. There is no evidence for gallstones, gallbladder wall thickening, or pericholecystic fluid. No intrahepatic or extrahepatic biliary dilation. No new suspicious abnormality within the liver parenchyma on today's study. Pancreas: No focal mass lesion. No dilatation of the main duct. No intraparenchymal cyst. No peripancreatic edema.  Spleen: No splenomegaly. No focal mass lesion. Adrenals/Urinary Tract: No adrenal nodule or mass. Cortical thinning noted in the kidneys bilaterally. Central sinus cysts are evident bilaterally with tiny cortical hypodensities too small to characterize but likely benign. No evidence for hydroureter. The urinary bladder appears normal for the degree of distention. Stomach/Bowel: Stomach is unremarkable. No gastric wall thickening. No evidence of outlet obstruction. Duodenum is normally positioned as is the ligament of Treitz. Duodenal diverticulum noted. No small bowel wall thickening. No small bowel dilatation. The terminal ileum is normal. The appendix is normal. No gross colonic mass. No colonic wall thickening. Diverticular changes are noted in the left colon without evidence of diverticulitis. Vascular/Lymphatic: There is abdominal aortic atherosclerosis without aneurysm. There is no gastrohepatic or hepatoduodenal ligament lymphadenopathy. No retroperitoneal or mesenteric lymphadenopathy. No pelvic sidewall lymphadenopathy. Reproductive: The uterus is surgically absent. There is no adnexal mass. Other: No intraperitoneal free fluid. Musculoskeletal: Small supraumbilical midline ventral hernia contains only fat. Tiny umbilical hernia contains only fat. No worrisome lytic or sclerotic osseous abnormality. Diffuse degenerative disc disease noted lumbar spine. Review of the MIP images confirms the above findings. IMPRESSION: 1. No CT evidence for acute pulmonary embolus. 2. Patchy areas of subtle ground-glass attenuation in the lungs bilaterally. Imaging features may be related to compressive atelectasis although pulmonary edema and atypical infection could have this appearance. Small bilateral pleural effusions are associated. 3. No acute findings in the abdomen or pelvis. 4. Left colonic diverticulosis without diverticulitis. 5. 2.7 cm right thyroid nodule has increased from 2.0 cm on 05/26/2013. In the setting  of significant comorbidities or limited life expectancy, no follow-up  recommended (ref: J Am Coll Radiol. 2015 Feb;12(2): 143-50). 6. Aortic Atherosclerosis (ICD10-I70.0). Electronically Signed   By: Misty Stanley M.D.   On: 10/25/2020 14:35   CT ABDOMEN PELVIS W CONTRAST  Result Date: 10/25/2020 CLINICAL DATA:  Shortness of breath. Nonlocalized acute abdominal pain. EXAM: CT ANGIOGRAPHY CHEST CT ABDOMEN AND PELVIS WITH CONTRAST TECHNIQUE: Multidetector CT imaging of the chest was performed using the standard protocol during bolus administration of intravenous contrast. Multiplanar CT image reconstructions and MIPs were obtained to evaluate the vascular anatomy. Multidetector CT imaging of the abdomen and pelvis was performed using the standard protocol during bolus administration of intravenous contrast. CONTRAST:  16mL OMNIPAQUE IOHEXOL 350 MG/ML SOLN COMPARISON:  Abdomen/pelvis CT 09/14/2019.  Chest CT 05/26/2013 FINDINGS: CTA CHEST FINDINGS Cardiovascular: The heart is enlarged. No substantial pericardial effusion. Coronary artery calcification is evident. Atherosclerotic calcification is noted in the wall of the thoracic aorta. Left permanent pacemaker noted. There is no filling defect within the opacified pulmonary arteries to suggest the presence of an acute pulmonary embolus. Mediastinum/Nodes: No mediastinal lymphadenopathy. 2.7 cm right thyroid nodule has increased from 2.0 cm on 05/26/2013. No evidence for gross hilar lymphadenopathy although assessment is limited by the lack of intravenous contrast on today's study. The esophagus has normal imaging features. There is no axillary lymphadenopathy. Lungs/Pleura: Patchy areas of subtle ground-glass attenuation are noted in the lungs bilaterally with subsegmental atelectasis noted in the lingula. Small bilateral pleural effusions noted. Bandlike scarring noted medial left lower lobe with associated volume loss. No overtly suspicious pulmonary nodule or  mass. Musculoskeletal: No worrisome lytic or sclerotic osseous abnormality. Degenerative changes are noted in both shoulders Review of the MIP images confirms the above findings. CT ABDOMEN and PELVIS FINDINGS Hepatobiliary: Stable small cyst in the dome of the left liver. Cyst noted previously in the inferior right hepatic lobe along the gallbladder fossa shows evidence of interval involution. 4.3 cm area of subcapsular hyperenhancement noted and posterior segment II, stable since study from 1 year ago, likely transient hepatic attenuation difference, potentially related to vascular anomaly. There is no evidence for gallstones, gallbladder wall thickening, or pericholecystic fluid. No intrahepatic or extrahepatic biliary dilation. No new suspicious abnormality within the liver parenchyma on today's study. Pancreas: No focal mass lesion. No dilatation of the main duct. No intraparenchymal cyst. No peripancreatic edema. Spleen: No splenomegaly. No focal mass lesion. Adrenals/Urinary Tract: No adrenal nodule or mass. Cortical thinning noted in the kidneys bilaterally. Central sinus cysts are evident bilaterally with tiny cortical hypodensities too small to characterize but likely benign. No evidence for hydroureter. The urinary bladder appears normal for the degree of distention. Stomach/Bowel: Stomach is unremarkable. No gastric wall thickening. No evidence of outlet obstruction. Duodenum is normally positioned as is the ligament of Treitz. Duodenal diverticulum noted. No small bowel wall thickening. No small bowel dilatation. The terminal ileum is normal. The appendix is normal. No gross colonic mass. No colonic wall thickening. Diverticular changes are noted in the left colon without evidence of diverticulitis. Vascular/Lymphatic: There is abdominal aortic atherosclerosis without aneurysm. There is no gastrohepatic or hepatoduodenal ligament lymphadenopathy. No retroperitoneal or mesenteric lymphadenopathy. No  pelvic sidewall lymphadenopathy. Reproductive: The uterus is surgically absent. There is no adnexal mass. Other: No intraperitoneal free fluid. Musculoskeletal: Small supraumbilical midline ventral hernia contains only fat. Tiny umbilical hernia contains only fat. No worrisome lytic or sclerotic osseous abnormality. Diffuse degenerative disc disease noted lumbar spine. Review of the MIP images confirms the above findings. IMPRESSION: 1. No  CT evidence for acute pulmonary embolus. 2. Patchy areas of subtle ground-glass attenuation in the lungs bilaterally. Imaging features may be related to compressive atelectasis although pulmonary edema and atypical infection could have this appearance. Small bilateral pleural effusions are associated. 3. No acute findings in the abdomen or pelvis. 4. Left colonic diverticulosis without diverticulitis. 5. 2.7 cm right thyroid nodule has increased from 2.0 cm on 05/26/2013. In the setting of significant comorbidities or limited life expectancy, no follow-up recommended (ref: J Am Coll Radiol. 2015 Feb;12(2): 143-50). 6. Aortic Atherosclerosis (ICD10-I70.0). Electronically Signed   By: Misty Stanley M.D.   On: 10/25/2020 14:35    ____________________________________________   PROCEDURES  Procedure(s) performed (including Critical Care):  .1-3 Lead EKG Interpretation Performed by: Lucrezia Starch, MD Authorized by: Lucrezia Starch, MD     Interpretation: non-specific     ECG rate assessment: normal     Rhythm: paced       ____________________________________________   INITIAL IMPRESSION / ASSESSMENT AND PLAN / ED COURSE        Patient presents with above to history exam for assessment of several complaints including some shortness of breath, dizziness and left lower abdominal pain.  She states that she has had all the symptoms for at least several weeks but none of them seemed a little bit worse today although it is unclear when they seem to have  worsened that she has some tangential historian.  On arrival she is afebrile and hemodynamically stable to slight hypertensive with BP of 163/66.  She actually is oriented and has a nonfocal neuro exam and is generally well-appearing given her age and comorbidities.  Is some mild tenderness in her left lower quadrant but her lungs are clear bilaterally.  CT head obtained in triage secondary to patient complaining of some redness dizziness although she is nonfocal neuro exam and CT head is unremarkable and Evalose patient for CVA or other clear acute intracranial process.  Suspect patient's lightheadedness may be related to her heart failure discussed below.  Patient also noted she had some difficulty swallowing solid food and states that sometimes it physically gets stuck but will only move down if she drinks some water.  Unclear etiology for this I have low suspicion for SBO or immediate left-sided pathology as she was able to tolerate p.o. and seems this has been progressive over at least several days to possibly week.  Will refer to GI for further evaluation.    With regard to her shortness of breath differential is quite broad and includes PE, pneumonia, pneumothorax, heart failure, symptomatic effusion, symptomatic anemia, metabolic derangements, ACS, symptomatic arrhythmia and obstructive lung disease. Chest x-ray shows some cardiomegaly with mild venous congestion without full consolidation pneumothorax or other clear acute intrathoracic process.  CT head is unremarkable.  CTA chest shows no evidence of PE pneumonia but does show evidence of some atelectasis and pulmonary edema with small bilateral pleural effusions.  ECG shows paced rhythm and given troponin of 11 and low suspicion for acute ACS or myocarditis.  BNP is elevated at 681 and given some edema on exam as well as chest imaging concern for acute heart failure.  No fever elevated white blood cell count to suggest acute infectious process at  this time.  In addition CBC shows hemoglobin of 10.9 compared to 12 7 months ago suspicion for acute symptomatic anemia.  Discussed concerns for acute heart failure with patient's cardiologist Dr. Lavera Guise who recommended giving the patient dose of Lasix  in the ED and starting her on 20 daily with plans to see him in clinic next week.  Discussed this with daughter at bedside who is amenable to this plan.  With regard to her left lower quadrant differential includes cystitis, diverticulitis, kidney stone, internal hernia is none visualized on external exam.  CT abdomen pelvis shows some colonic diverticuli without any evidence of diverticulitis or other acute abdominopelvic process.  No evidence of kidney stone, pyelonephritis or any other clear cause of patient's lower abdominal pain at this time.  CMP shows no significant electrolyte or metabolic derangements.  UA is consistent with possible cystitis given patient has some trace LE S and 11-20 WBCs and there is no other clear etiology for patient's lower abdominal discomfort.  Urine culture was sent will write Rx for short course of Macrobid.   Given stable vitals otherwise reassuring exam work-up with patient safe for discharge with plan for outpatient cardiology, GI and PCP follow-up.  All this is discussed with patient and daughter at bedside prior to discharge.  Discharged stable condition.  Strict return precautions advised and discussed.  ____________________________________________   FINAL CLINICAL IMPRESSION(S) / ED DIAGNOSES  Final diagnoses:  Acute heart failure, unspecified heart failure type (HCC)  Acute cystitis without hematuria  Dysphagia, unspecified type    Medications  sodium chloride flush (NS) 0.9 % injection 3 mL (3 mLs Intravenous Not Given 10/25/20 1309)  iohexol (OMNIPAQUE) 350 MG/ML injection 75 mL (75 mLs Intravenous Contrast Given 10/25/20 1331)  furosemide (LASIX) injection 40 mg (40 mg Intravenous Given 10/25/20 1515)      ED Discharge Orders         Ordered    furosemide (LASIX) 20 MG tablet  Daily        10/25/20 1512    nitrofurantoin, macrocrystal-monohydrate, (MACROBID) 100 MG capsule  2 times daily        10/25/20 1532           Note:  This document was prepared using Dragon voice recognition software and may include unintentional dictation errors.   Lucrezia Starch, MD 10/25/20 [phone]

## 2020-10-25 NOTE — ED Notes (Signed)
Patient's daughter is taking patient back to the [facility] [place] [facility].  Patient's daughter is spending the night with patient tonight and states she is going to call the nurse at the [facility] [place] and discuss patient's condition.

## 2020-10-26 ENCOUNTER — Telehealth: Payer: Self-pay | Admitting: *Deleted

## 2020-10-26 ENCOUNTER — Other Ambulatory Visit: Payer: Self-pay | Admitting: Internal Medicine

## 2020-10-26 DIAGNOSIS — G894 Chronic pain syndrome: Secondary | ICD-10-CM

## 2020-10-26 NOTE — Telephone Encounter (Signed)
Transition Care Management Follow-up Telephone Call  Date of discharge and from where: 10/25/2020 [facility] ED  How have you been since you were released from the hospital? "I am alright"  Any questions or concerns? No  Items Reviewed:  Did the pt receive and understand the discharge instructions provided? Yes   Medications obtained and verified? No   Other? No   Any new allergies since your discharge? No   Dietary orders reviewed? No  Do you have support at home? Yes    Functional Questionnaire: (I = Independent and D = Dependent) ADLs: I  Bathing/Dressing- I  Meal Prep- I  Eating- I  Maintaining continence- I  Transferring/Ambulation- I  Managing Meds- I  Follow up appointments reviewed:   PCP Hospital f/u appt confirmed? No    Specialist Hospital f/u appt confirmed? No    Are transportation arrangements needed? No   If their condition worsens, is the pt aware to call PCP or go to the Emergency Dept.? Yes  Was the patient provided with contact information for the PCP's office or ED? Yes  Was to pt encouraged to call back with questions or concerns? Yes

## 2020-10-28 LAB — URINE CULTURE: Culture: >=100000 — AB

## 2020-10-31 ENCOUNTER — Other Ambulatory Visit: Payer: Self-pay

## 2020-10-31 ENCOUNTER — Ambulatory Visit (INDEPENDENT_AMBULATORY_CARE_PROVIDER_SITE_OTHER): Payer: Medicare Other | Admitting: Family Medicine

## 2020-10-31 ENCOUNTER — Encounter: Payer: Self-pay | Admitting: Family Medicine

## 2020-10-31 VITALS — BP 124/62 | HR 60 | Ht 59.0 in | Wt 141.1 lb

## 2020-10-31 DIAGNOSIS — R0602 Shortness of breath: Secondary | ICD-10-CM | POA: Diagnosis not present

## 2020-10-31 MED ORDER — FUROSEMIDE 20 MG PO TABS: 20.0000 mg | ORAL_TABLET | Freq: Every day | ORAL | 3 refills | Status: DC

## 2020-11-03 ENCOUNTER — Encounter: Payer: Self-pay | Admitting: Student in an Organized Health Care Education/Training Program

## 2020-11-03 ENCOUNTER — Other Ambulatory Visit: Payer: Self-pay

## 2020-11-03 ENCOUNTER — Ambulatory Visit
Payer: Medicare Other | Attending: Student in an Organized Health Care Education/Training Program | Admitting: Student in an Organized Health Care Education/Training Program

## 2020-11-03 VITALS — BP 154/55 | HR 59 | Temp 97.2°F | Resp 16 | Ht 60.0 in | Wt 140.0 lb

## 2020-11-03 DIAGNOSIS — M170 Bilateral primary osteoarthritis of knee: Secondary | ICD-10-CM | POA: Diagnosis present

## 2020-11-03 DIAGNOSIS — S46001S Unspecified injury of muscle(s) and tendon(s) of the rotator cuff of right shoulder, sequela: Secondary | ICD-10-CM | POA: Diagnosis present

## 2020-11-03 DIAGNOSIS — M1711 Unilateral primary osteoarthritis, right knee: Secondary | ICD-10-CM | POA: Insufficient documentation

## 2020-11-03 DIAGNOSIS — G894 Chronic pain syndrome: Secondary | ICD-10-CM | POA: Insufficient documentation

## 2020-11-03 DIAGNOSIS — M1712 Unilateral primary osteoarthritis, left knee: Secondary | ICD-10-CM | POA: Diagnosis present

## 2020-11-03 DIAGNOSIS — M19111 Post-traumatic osteoarthritis, right shoulder: Secondary | ICD-10-CM

## 2020-11-03 DIAGNOSIS — M19011 Primary osteoarthritis, right shoulder: Secondary | ICD-10-CM | POA: Diagnosis not present

## 2020-11-03 MED ORDER — DOCUSATE SODIUM 100 MG PO CAPS: 100.0000 mg | ORAL_CAPSULE | Freq: Every evening | ORAL | 5 refills | Status: AC | PRN

## 2020-11-03 MED ORDER — TRAMADOL HCL ER 100 MG PO TB24: 100.0000 mg | ORAL_TABLET | Freq: Every day | ORAL | 1 refills | Status: DC

## 2020-11-03 NOTE — Progress Notes (Signed)
Safety precautions to be maintained throughout the outpatient stay will include: orient to surroundings, keep bed in low position, maintain call bell within reach at all times, provide assistance with transfer out of bed and ambulation.  

## 2020-11-03 NOTE — Progress Notes (Signed)
PROVIDER NOTE: Information contained herein reflects review and annotations entered in association with encounter. Interpretation of such information and data should be left to medically-trained personnel. Information provided to patient can be located elsewhere in the medical record under "Patient Instructions". Document created using STT-dictation technology, any transcriptional errors that may result from process are unintentional.    Patient: Sue Knapp  Service Category: E/M  Provider: Gillis Santa, MD  DOB: 09-18-1922  DOS: 11/03/2020  Specialty: Interventional Pain Management  MRN: [id number]  Setting: Ambulatory outpatient  PCP: Cletis Athens, MD  Type: Established Patient    Referring Provider: Cletis Athens, MD  Location: Office  Delivery: Face-to-face     HPI  Sue Knapp, a 87 y.o. year old female, is here today because of her Primary osteoarthritis of right knee [M17.11]. Sue Knapp primary complain today is Knee Pain (bilat) and Shoulder Pain (bilat) Pain Assessment: Severity of Chronic pain is reported as a 8 /10. Location: Knee Right,Left/denies; Al[place] shoulders bilat. Onset: More than a month ago. Quality: Aching,Shooting. Timing: Constant. Modifying factor(s): tramadol. Vitals:  height is 5' (1.524 m) and weight is 140 lb (63.5 kg). Her temporal temperature is 97.2 F (36.2 C) (abnormal). Her blood pressure is 154/55 (abnormal) and her pulse is 59 (abnormal). Her respiration is 16 and oxygen saturation is 99%.   Reason for encounter: medication management.   Sue Knapp presents today for medication management.  Her last clinic visit with me was November 30 of 2020.  At that time she wanted to discontinue her tramadol as she thought it was causing GI discomfort, constipation and cognitive issues.  She later realized that tramadol was not the culprit and overall her pain has worsen and she would like to restart tramadol prescription again but is wondering if there are any  other alternatives.  She is accompanied today by her daughter.  She has been working with Dr. Candelaria Stagers for her shoulder and knee pain.  She is in the process of obtaining a knee brace.  We discussed restarting tramadol however I think it may be better for her to be on 100 mg extended release which may provide better long-acting pain relief.  I also recommend that she start a stool softener at night to decrease her risk of constipation.   ROS  Constitutional: Denies any fever or chills Gastrointestinal: No reported hemesis, hematochezia, vomiting, or acute GI distress Musculoskeletal: Diffuse arthralgias and myalgias. Neurological: No reported episodes of acute onset apraxia, aphasia, dysarthria, agnosia, amnesia, paralysis, loss of coordination, or loss of consciousness  Medication Review  Cholecalciferol, acetaminophen, albuterol, apixaban, docusate sodium, furosemide, hydrochlorothiazide, ibuprofen, losartan, metoprolol succinate, pregabalin, and traMADol  History Review  Allergy: Sue Knapp is allergic to codeine, etodolac, and zolpidem. Drug: Sue Knapp  reports no history of drug use. Alcohol:  reports no history of alcohol use. Tobacco:  reports that she has never smoked. She has never used smokeless tobacco. Social: Sue Knapp  reports that she has never smoked. She has never used smokeless tobacco. She reports that she does not drink alcohol and does not use drugs. Medical:  has a past medical history of Arthritis, Breast cancer ([place]) (2013), Cataract, Depression, Fatigue, GERD (gastroesophageal reflux disease), Glaucoma, Heart murmur, Heart palpitations, Hiatal hernia, HOH (hard of hearing), Hyperlipidemia, Hypertension, Hypothyroidism, Left ankle injury, Left bundle branch block, Neuropathy, Osteoporosis, Pacemaker, Palpitations, Personal history of radiation therapy (2013), Scoliosis, Shortness of breath dyspnea, Thyroid nodule, and TIA (transient ischemic attack). Surgical: Ms.  Knapp  has  a past surgical history that includes Thyroidectomy, partial; Abdominal hysterectomy; Breast surgery; Cataract extraction w/PHACO (Right, 03/01/2015); Cataract extraction w/PHACO (Left, 03/22/2015); Mastectomy partial / lumpectomy; Breast lumpectomy (Left, 02/21/2012); Breast biopsy (Left, 2005); Breast biopsy (Left, 2013); and Eye surgery (09/2020 and 10/2020). Family: family history is not on file.  Laboratory Chemistry Profile   Renal Lab Results  Component Value Date   BUN 46 (H) 10/25/2020   CREATININE 1.00 10/25/2020   GFRAA >60 09/14/2019   GFRNONAA 52 (L) 10/25/2020     Hepatic Lab Results  Component Value Date   AST 50 (H) 10/25/2020   ALT 68 (H) 10/25/2020   ALBUMIN 3.9 10/25/2020   ALKPHOS 84 10/25/2020   LIPASE 36 09/14/2019     Electrolytes Lab Results  Component Value Date   NA 141 10/25/2020   K 4.0 10/25/2020   CL 107 10/25/2020   CALCIUM 9.3 10/25/2020     Bone No results found for: VD25OH, VD125OH2TOT, ZY2482NO0, BB0488QB1, 25OHVITD1, 25OHVITD2, 25OHVITD3, TESTOFREE, TESTOSTERONE   Inflammation (CRP: Acute Phase) (ESR: Chronic Phase) No results found for: CRP, ESRSEDRATE, LATICACIDVEN     Note: Above Lab results reviewed.  Recent Imaging Review  DG Chest 1 View CLINICAL DATA:  Shortness of breath.  EXAM: CHEST  1 VIEW  COMPARISON:  CT 10/25/2020.  Chest x-ray 09/14/2019.  FINDINGS: Cardiac pacer with lead tip over the right atrium right ventricle. Cardiomegaly with mild pulmonary venous congestion. Bilateral interstitial prominence. Findings consistent with mild CHF. Pneumonitis cannot be excluded. No prominent pleural effusion. No pneumothorax. Degenerative changes both shoulders. Degenerative changes scoliosis thoracic spine.  IMPRESSION: Cardiac pacer with lead tips over the right atrium right ventricle. Cardiomegaly with mild pulmonary venous congestion and bilateral interstitial prominence. Most consistent with mild  CHF.  Electronically Signed   By: Marcello Moores  Register   On: 10/25/2020 14:36 CT Angio Chest PE W and/or Wo Contrast CLINICAL DATA:  Shortness of breath. Nonlocalized acute abdominal pain.  EXAM: CT ANGIOGRAPHY CHEST  CT ABDOMEN AND PELVIS WITH CONTRAST  TECHNIQUE: Multidetector CT imaging of the chest was performed using the standard protocol during bolus administration of intravenous contrast. Multiplanar CT image reconstructions and MIPs were obtained to evaluate the vascular anatomy. Multidetector CT imaging of the abdomen and pelvis was performed using the standard protocol during bolus administration of intravenous contrast.  CONTRAST:  45mL OMNIPAQUE IOHEXOL 350 MG/ML SOLN  COMPARISON:  Abdomen/pelvis CT 09/14/2019.  Chest CT 05/26/2013  FINDINGS: CTA CHEST FINDINGS  Cardiovascular: The heart is enlarged. No substantial pericardial effusion. Coronary artery calcification is evident. Atherosclerotic calcification is noted in the wall of the thoracic aorta. Left permanent pacemaker noted. There is no filling defect within the opacified pulmonary arteries to suggest the presence of an acute pulmonary embolus.  Mediastinum/Nodes: No mediastinal lymphadenopathy. 2.7 cm right thyroid nodule has increased from 2.0 cm on 05/26/2013. No evidence for gross hilar lymphadenopathy although assessment is limited by the lack of intravenous contrast on today's study. The esophagus has normal imaging features. There is no axillary lymphadenopathy.  Lungs/Pleura: Patchy areas of subtle ground-glass attenuation are noted in the lungs bilaterally with subsegmental atelectasis noted in the lingula. Small bilateral pleural effusions noted. Bandlike scarring noted medial left lower lobe with associated volume loss. No overtly suspicious pulmonary nodule or mass.  Musculoskeletal: No worrisome lytic or sclerotic osseous abnormality. Degenerative changes are noted in both  shoulders  Review of the MIP images confirms the above findings.  CT ABDOMEN and PELVIS FINDINGS  Hepatobiliary: Stable  small cyst in the dome of the left liver. Cyst noted previously in the inferior right hepatic lobe along the gallbladder fossa shows evidence of interval involution. 4.3 cm area of subcapsular hyperenhancement noted and posterior segment II, stable since study from 1 year ago, likely transient hepatic attenuation difference, potentially related to vascular anomaly. There is no evidence for gallstones, gallbladder wall thickening, or pericholecystic fluid. No intrahepatic or extrahepatic biliary dilation. No new suspicious abnormality within the liver parenchyma on today's study.  Pancreas: No focal mass lesion. No dilatation of the main duct. No intraparenchymal cyst. No peripancreatic edema.  Spleen: No splenomegaly. No focal mass lesion.  Adrenals/Urinary Tract: No adrenal nodule or mass. Cortical thinning noted in the kidneys bilaterally. Central sinus cysts are evident bilaterally with tiny cortical hypodensities too small to characterize but likely benign. No evidence for hydroureter. The urinary bladder appears normal for the degree of distention.  Stomach/Bowel: Stomach is unremarkable. No gastric wall thickening. No evidence of outlet obstruction. Duodenum is normally positioned as is the ligament of Treitz. Duodenal diverticulum noted. No small bowel wall thickening. No small bowel dilatation. The terminal ileum is normal. The appendix is normal. No gross colonic mass. No colonic wall thickening. Diverticular changes are noted in the left colon without evidence of diverticulitis.  Vascular/Lymphatic: There is abdominal aortic atherosclerosis without aneurysm. There is no gastrohepatic or hepatoduodenal ligament lymphadenopathy. No retroperitoneal or mesenteric lymphadenopathy. No pelvic sidewall lymphadenopathy.  Reproductive: The uterus is  surgically absent. There is no adnexal mass.  Other: No intraperitoneal free fluid.  Musculoskeletal: Small supraumbilical midline ventral hernia contains only fat. Tiny umbilical hernia contains only fat. No worrisome lytic or sclerotic osseous abnormality. Diffuse degenerative disc disease noted lumbar spine.  Review of the MIP images confirms the above findings.  IMPRESSION: 1. No CT evidence for acute pulmonary embolus. 2. Patchy areas of subtle ground-glass attenuation in the lungs bilaterally. Imaging features may be related to compressive atelectasis although pulmonary edema and atypical infection could have this appearance. Small bilateral pleural effusions are associated. 3. No acute findings in the abdomen or pelvis. 4. Left colonic diverticulosis without diverticulitis. 5. 2.7 cm right thyroid nodule has increased from 2.0 cm on 05/26/2013. In the setting of significant comorbidities or limited life expectancy, no follow-up recommended (ref: J Am Coll Radiol. 2015 Feb;12(2): 143-50). 6. Aortic Atherosclerosis (ICD10-I70.0).  Electronically Signed   By: Misty Stanley M.D.   On: 10/25/2020 14:35 CT ABDOMEN PELVIS W CONTRAST CLINICAL DATA:  Shortness of breath. Nonlocalized acute abdominal pain.  EXAM: CT ANGIOGRAPHY CHEST  CT ABDOMEN AND PELVIS WITH CONTRAST  TECHNIQUE: Multidetector CT imaging of the chest was performed using the standard protocol during bolus administration of intravenous contrast. Multiplanar CT image reconstructions and MIPs were obtained to evaluate the vascular anatomy. Multidetector CT imaging of the abdomen and pelvis was performed using the standard protocol during bolus administration of intravenous contrast.  CONTRAST:  36mL OMNIPAQUE IOHEXOL 350 MG/ML SOLN  COMPARISON:  Abdomen/pelvis CT 09/14/2019.  Chest CT 05/26/2013  FINDINGS: CTA CHEST FINDINGS  Cardiovascular: The heart is enlarged. No substantial pericardial effusion.  Coronary artery calcification is evident. Atherosclerotic calcification is noted in the wall of the thoracic aorta. Left permanent pacemaker noted. There is no filling defect within the opacified pulmonary arteries to suggest the presence of an acute pulmonary embolus.  Mediastinum/Nodes: No mediastinal lymphadenopathy. 2.7 cm right thyroid nodule has increased from 2.0 cm on 05/26/2013. No evidence for gross hilar lymphadenopathy although assessment is limited  by the lack of intravenous contrast on today's study. The esophagus has normal imaging features. There is no axillary lymphadenopathy.  Lungs/Pleura: Patchy areas of subtle ground-glass attenuation are noted in the lungs bilaterally with subsegmental atelectasis noted in the lingula. Small bilateral pleural effusions noted. Bandlike scarring noted medial left lower lobe with associated volume loss. No overtly suspicious pulmonary nodule or mass.  Musculoskeletal: No worrisome lytic or sclerotic osseous abnormality. Degenerative changes are noted in both shoulders  Review of the MIP images confirms the above findings.  CT ABDOMEN and PELVIS FINDINGS  Hepatobiliary: Stable small cyst in the dome of the left liver. Cyst noted previously in the inferior right hepatic lobe along the gallbladder fossa shows evidence of interval involution. 4.3 cm area of subcapsular hyperenhancement noted and posterior segment II, stable since study from 1 year ago, likely transient hepatic attenuation difference, potentially related to vascular anomaly. There is no evidence for gallstones, gallbladder wall thickening, or pericholecystic fluid. No intrahepatic or extrahepatic biliary dilation. No new suspicious abnormality within the liver parenchyma on today's study.  Pancreas: No focal mass lesion. No dilatation of the main duct. No intraparenchymal cyst. No peripancreatic edema.  Spleen: No splenomegaly. No focal mass  lesion.  Adrenals/Urinary Tract: No adrenal nodule or mass. Cortical thinning noted in the kidneys bilaterally. Central sinus cysts are evident bilaterally with tiny cortical hypodensities too small to characterize but likely benign. No evidence for hydroureter. The urinary bladder appears normal for the degree of distention.  Stomach/Bowel: Stomach is unremarkable. No gastric wall thickening. No evidence of outlet obstruction. Duodenum is normally positioned as is the ligament of Treitz. Duodenal diverticulum noted. No small bowel wall thickening. No small bowel dilatation. The terminal ileum is normal. The appendix is normal. No gross colonic mass. No colonic wall thickening. Diverticular changes are noted in the left colon without evidence of diverticulitis.  Vascular/Lymphatic: There is abdominal aortic atherosclerosis without aneurysm. There is no gastrohepatic or hepatoduodenal ligament lymphadenopathy. No retroperitoneal or mesenteric lymphadenopathy. No pelvic sidewall lymphadenopathy.  Reproductive: The uterus is surgically absent. There is no adnexal mass.  Other: No intraperitoneal free fluid.  Musculoskeletal: Small supraumbilical midline ventral hernia contains only fat. Tiny umbilical hernia contains only fat. No worrisome lytic or sclerotic osseous abnormality. Diffuse degenerative disc disease noted lumbar spine.  Review of the MIP images confirms the above findings.  IMPRESSION: 1. No CT evidence for acute pulmonary embolus. 2. Patchy areas of subtle ground-glass attenuation in the lungs bilaterally. Imaging features may be related to compressive atelectasis although pulmonary edema and atypical infection could have this appearance. Small bilateral pleural effusions are associated. 3. No acute findings in the abdomen or pelvis. 4. Left colonic diverticulosis without diverticulitis. 5. 2.7 cm right thyroid nodule has increased from 2.0 cm on 05/26/2013. In  the setting of significant comorbidities or limited life expectancy, no follow-up recommended (ref: J Am Coll Radiol. 2015 Feb;12(2): 143-50). 6. Aortic Atherosclerosis (ICD10-I70.0).  Electronically Signed   By: Misty Stanley M.D.   On: 10/25/2020 14:35 CT HEAD WO CONTRAST CLINICAL DATA:  Dizziness.  EXAM: CT HEAD WITHOUT CONTRAST  TECHNIQUE: Contiguous axial images were obtained from the base of the skull through the vertex without intravenous contrast.  COMPARISON:  September 14, 2019.  FINDINGS: Brain: Mild chronic ischemic white matter disease is noted. Mild diffuse cortical atrophy is noted. No mass effect or midline shift is noted. Ventricular size is within normal limits. There is no evidence of mass lesion, hemorrhage or acute infarction.  Vascular: No hyperdense vessel or unexpected calcification.  Skull: Normal. Negative for fracture or focal lesion.  Sinuses/Orbits: No acute finding.  Other: None.  IMPRESSION: No acute intracranial abnormality seen.  Electronically Signed   By: Lupita Raider M.D.   On: 10/25/2020 14:23 Note: Reviewed        Physical Exam  General appearance: Well nourished, well developed, and well hydrated. In no apparent acute distress Mental status: Alert, oriented x 3 (person, place, & time)       Respiratory: No evidence of acute respiratory distress Eyes: PERLA Vitals: BP (!) 154/55 (BP Location: Right Arm, Patient Position: Sitting, Cuff Size: Normal)   Pulse (!) 59   Temp (!) 97.2 F (36.2 C) (Temporal)   Resp 16   Ht 5' (1.524 m)   Wt 140 lb (63.5 kg)   SpO2 99%   BMI 27.34 kg/m  BMI: Estimated body mass index is 27.34 kg/m as calculated from the following:   Height as of this encounter: 5' (1.524 m).   Weight as of this encounter: 140 lb (63.5 kg). Ideal: Ideal body weight: 45.5 kg (100 lb 4.9 oz) Adjusted ideal body weight: 52.7 kg (116 lb 3 oz)  Lumbar Spine Area Exam  Skin & Axial Inspection: No masses,  redness, or swelling Alignment: Symmetrical Functional ROM: Pain restricted ROM       Stability: No instability detected Muscle Tone/Strength: Functionally intact. No obvious neuro-muscular anomalies detected. Sensory (Neurological): Musculoskeletal pain pattern  Gait & Posture Assessment  Ambulation: Patient came in today in a wheel chair Gait: Limited. Using assistive device to ambulate Posture: Difficulty standing up straight, due to pain   Lower Extremity Exam    Side: Right lower extremity  Side: Left lower extremity  Stability: No instability observed          Stability: No instability observed          Skin & Extremity Inspection: brace in place  Skin & Extremity Inspection: Atrophy  Functional ROM: Pain restricted ROM for knee joint          Functional ROM: Pain restricted ROM for knee joint          Muscle Tone/Strength: Functionally intact. No obvious neuro-muscular anomalies detected.  Muscle Tone/Strength: Functionally intact. No obvious neuro-muscular anomalies detected.  Sensory (Neurological): Arthropathic arthralgia        Sensory (Neurological): Arthropathic arthralgia         Assessment   Status Diagnosis  Controlled Controlled Controlled 1. Primary osteoarthritis of right knee   2. Primary osteoarthritis of left knee   3. Osteoarthritis of right shoulder due to rotator cuff injury   4. Osteoarthritis of right shoulder, unspecified osteoarthritis type   5. Bilateral primary osteoarthritis of knee   6. Chronic pain syndrome      Plan of Care   Sue Knapp has a current medication list which includes the following long-term medication(s): albuterol, apixaban, furosemide, furosemide, hydrochlorothiazide, losartan, metoprolol succinate, and pregabalin.  Pharmacotherapy (Medications Ordered): Meds ordered this encounter  Medications  . traMADol (ULTRAM-ER) 100 MG 24 hr tablet    Sig: Take 1 tablet (100 mg total) by mouth daily.    Dispense:   30 tablet    Refill:  1  . docusate sodium (COLACE) 100 MG capsule    Sig: Take 1-2 capsules (100-200 mg total) by mouth at bedtime as needed for moderate constipation.    Dispense:  60 capsule    Refill:  5  Fill one day early if pharmacy is closed on scheduled refill date. May substitute for generic if available.   Follow-up plan:   Return in about 8 weeks (around 12/29/2020) for Medication Management, in person.     Status post right knee genicular nerve block 1 on 03/09/2019.  S/p right shoulder steroid injection 03/23/19. 05/04/2019:  R Genicular RFA- not effective.        Recent Visits No visits were found meeting these conditions. Showing recent visits within past 90 days and meeting all other requirements Today's Visits Date Type Provider Dept  11/03/20 Office Visit Gillis Santa, MD [facility]  Showing today's visits and meeting all other requirements Future Appointments No visits were found meeting these conditions. Showing future appointments within next 90 days and meeting all other requirements  I discussed the assessment and treatment plan with the patient. The patient was provided an opportunity to ask questions and all were answered. The patient agreed with the plan and demonstrated an understanding of the instructions.  Patient advised to call back or seek an in-person evaluation if the symptoms or condition worsens.  Duration of encounter: 36minutes.  Note by: Gillis Santa, MD Date: 11/03/2020; Time: 3:21 PM

## 2020-11-04 ENCOUNTER — Emergency Department
Admission: EM | Admit: 2020-11-04 | Discharge: 2020-11-04 | Disposition: A | Discharge status: Home / Self Care | Payer: Medicare Other | Attending: Emergency Medicine | Admitting: Emergency Medicine

## 2020-11-04 ENCOUNTER — Emergency Department: Payer: Medicare Other

## 2020-11-04 ENCOUNTER — Encounter: Payer: Self-pay | Admitting: Intensive Care

## 2020-11-04 ENCOUNTER — Other Ambulatory Visit: Payer: Self-pay

## 2020-11-04 DIAGNOSIS — M5136 Other intervertebral disc degeneration, lumbar region: Secondary | ICD-10-CM | POA: Insufficient documentation

## 2020-11-04 DIAGNOSIS — Z7901 Long term (current) use of anticoagulants: Secondary | ICD-10-CM | POA: Diagnosis not present

## 2020-11-04 DIAGNOSIS — K5730 Diverticulosis of large intestine without perforation or abscess without bleeding: Secondary | ICD-10-CM | POA: Diagnosis not present

## 2020-11-04 DIAGNOSIS — W01198A Fall on same level from slipping, tripping and stumbling with subsequent striking against other object, initial encounter: Secondary | ICD-10-CM | POA: Insufficient documentation

## 2020-11-04 DIAGNOSIS — S0003XA Contusion of scalp, initial encounter: Secondary | ICD-10-CM | POA: Insufficient documentation

## 2020-11-04 DIAGNOSIS — I10 Essential (primary) hypertension: Secondary | ICD-10-CM | POA: Insufficient documentation

## 2020-11-04 DIAGNOSIS — K429 Umbilical hernia without obstruction or gangrene: Secondary | ICD-10-CM | POA: Diagnosis not present

## 2020-11-04 DIAGNOSIS — Z853 Personal history of malignant neoplasm of breast: Secondary | ICD-10-CM | POA: Insufficient documentation

## 2020-11-04 DIAGNOSIS — W19XXXA Unspecified fall, initial encounter: Secondary | ICD-10-CM

## 2020-11-04 DIAGNOSIS — E039 Hypothyroidism, unspecified: Secondary | ICD-10-CM | POA: Insufficient documentation

## 2020-11-04 DIAGNOSIS — M545 Low back pain, unspecified: Secondary | ICD-10-CM | POA: Insufficient documentation

## 2020-11-04 DIAGNOSIS — Z950 Presence of cardiac pacemaker: Secondary | ICD-10-CM | POA: Diagnosis not present

## 2020-11-04 DIAGNOSIS — S0990XA Unspecified injury of head, initial encounter: Secondary | ICD-10-CM | POA: Diagnosis present

## 2020-11-04 MED ORDER — ACETAMINOPHEN 500 MG PO TABS
1000.0000 mg | ORAL_TABLET | Freq: Once | ORAL | Status: DC
  Filled 2020-11-04: qty 2

## 2020-11-04 NOTE — ED Provider Notes (Signed)
[place] [facility] Emergency Department Provider Note  ____________________________________________   Event Date/Time   First MD Initiated Contact with Patient 11/04/20 1017     (approximate) I have reviewed the triage vital signs and the nursing notes.   HISTORY  Chief Complaint Fall    HPI CHEYNE BUNGERT is a 87 y.o. female here with follow-up.  The patient states that she was using her walker in her house today.  She states that her knee gave out.  This is a chronic issue due to severe arthritis in her knee.  When the knee gave out, she fell, striking her buttocks and then her back of her head.  She is on blood thinners.  She states she did not lose consciousness.  She has since had a moderate, aching, throbbing, headache.  No neck pain.  No upper extremity numbness or weakness.  She has also had some lower back and sacral pain.  No lower extremity numbness or weakness.  No other complaints.  She was well prior to the fall.  No recent illness.        Past Medical History:  Diagnosis Date  . Arthritis   . Breast cancer ([place]) 2013   left breast, radiation and lumpectomy  . Cataract   . Depression   . Fatigue   . GERD (gastroesophageal reflux disease)   . Glaucoma   . Heart murmur   . Heart palpitations   . Hiatal hernia   . HOH (hard of hearing)    AIDS  . Hyperlipidemia   . Hypertension   . Hypothyroidism   . Left ankle injury    Old left ankle injury  . Left bundle branch block   . Neuropathy   . Osteoporosis   . Pacemaker   . Palpitations   . Personal history of radiation therapy 2013   left breast ca  . Scoliosis   . Shortness of breath dyspnea   . Thyroid nodule   . TIA (transient ischemic attack)     Patient Active Problem List   Diagnosis Date Noted  . Shortness of breath 10/31/2020  . MP (metacarpophalangeal) joint sprain, initial encounter 04/04/2020  . Dermatitis 04/04/2020  . Rotator cuff arthropathy of left shoulder  03/03/2020  . Annual physical exam 02/10/2020  . Hx of breast cancer 12/11/2019  . Essential hypertension 12/10/2019  . Pacemaker 11/26/2019  . Sick sinus syndrome due to SA node dysfunction ([place]) 11/26/2019  . Primary osteoarthritis of right knee 03/03/2019  . Primary osteoarthritis of left knee 03/03/2019  . Bilateral primary osteoarthritis of knee 03/03/2019  . Osteoarthritis of right shoulder 03/03/2019  . Chronic pain syndrome 03/03/2019    Past Surgical History:  Procedure Laterality Date  . ABDOMINAL HYSTERECTOMY    . BREAST BIOPSY Left 2005   [place]  . BREAST BIOPSY Left 2013   stereo bx, invasive tubular carcinoma and DCIS  . 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 BREAST SURGERY     LUMPECTOMY  . CATARACT EXTRACTION W/PHACO Right 03/01/2015   Procedure: CATARACT EXTRACTION PHACO AND INTRAOCULAR LENS PLACEMENT (IOC);  Surgeon: Birder Robson, MD;  Location: [facility] ORS;  Service: Ophthalmology;  Laterality: Right;  US:1:03.1 AP:22.8 CDE:14.37  Fluid lot # [id number]  . CATARACT EXTRACTION W/PHACO Left 03/22/2015   Procedure: CATARACT EXTRACTION PHACO AND INTRAOCULAR LENS PLACEMENT (IOC);  Surgeon: Birder Robson, MD;  Location: [facility] ORS;  Service: Ophthalmology;  Laterality: Left;  [place]: 01:01.0 AP%: 22.5% CDE: 13.75 Lot # [id number]  . EYE SURGERY  09/2020 and 10/2020  . MASTECTOMY PARTIAL / LUMPECTOMY    . THYROIDECTOMY, PARTIAL      Prior to Admission medications   Medication Sig Start Date End Date Taking? Authorizing Provider  acetaminophen (TYLENOL) 650 MG CR tablet Take 1,300 mg by mouth 2 (two) times daily.    [provider]  albuterol (VENTOLIN HFA) 108 (90 Base) MCG/ACT inhaler Inhale 2 puffs into the lungs every 6 (six) hours as needed for wheezing or shortness of breath. 09/09/20   Beckie Salts, FNP  apixaban (ELIQUIS) 2.5 MG TABS tablet Take 1 tablet (2.5 mg total) by mouth 2 (two) times  daily. 02/25/20   Cletis Athens, MD  Cholecalciferol 50 MCG (2000 UT) TABS Take 2,000 Units by mouth daily.     [provider]  docusate sodium (COLACE) 100 MG capsule Take 1-2 capsules (100-200 mg total) by mouth at bedtime as needed for moderate constipation. 11/03/20 05/02/21  Gillis Santa, MD  furosemide (LASIX) 20 MG tablet Take 1 tablet (20 mg total) by mouth daily for 7 days. 10/25/20 11/01/20  Lucrezia Starch, MD  furosemide (LASIX) 20 MG tablet Take 1 tablet (20 mg total) by mouth daily. 10/31/20   Beckie Salts, FNP  hydrochlorothiazide (HYDRODIURIL) 25 MG tablet TAKE 1 TABLET BY MOUTH DAILY 09/15/20   Beckie Salts, FNP  ibuprofen (ADVIL,MOTRIN) 200 MG tablet Take 200 mg by mouth 2 (two) times daily.    [provider]  losartan (COZAAR) 100 MG tablet TAKE 1 TABLET BY MOUTH DAILY 10/27/20   Beckie Salts, FNP  metoprolol succinate (TOPROL-XL) 50 MG 24 hr tablet TAKE 1 TABLET BY MOUTH DAILY 07/11/20   Cletis Athens, MD  pregabalin (LYRICA) 50 MG capsule TAKE 1 CAPSULE 3 TIMES DAILY 10/27/20   Beckie Salts, FNP  traMADol (ULTRAM-ER) 100 MG 24 hr tablet Take 1 tablet (100 mg total) by mouth daily. 11/03/20   Gillis Santa, MD    Allergies Codeine, Etodolac, and Zolpidem  Family History  Problem Relation Age of Onset  . Breast cancer Neg Hx     Social History Social History   Tobacco Use  . Smoking status: Never Smoker  . Smokeless tobacco: Never Used  Substance Use Topics  . Alcohol use: No  . Drug use: Yes    Comment: pain clinic: tramadol    Review of Systems  Review of Systems  Constitutional: Negative for chills and fever.  HENT: Negative for sore throat.   Respiratory: Negative for shortness of breath.   Cardiovascular: Negative for chest pain.  Gastrointestinal: Negative for abdominal pain.  Genitourinary: Negative for flank pain.  Musculoskeletal: Positive for arthralgias and back pain. Negative for neck pain.  Skin: Negative for rash and wound.   Allergic/Immunologic: Negative for immunocompromised state.  Neurological: Positive for headaches. Negative for weakness and numbness.  Hematological: Does not bruise/bleed easily.  All other systems reviewed and are negative.    ____________________________________________  PHYSICAL EXAM:      VITAL SIGNS: ED Triage Vitals  Enc Vitals Group     BP 11/04/20 1007 (!) 179/73     Pulse Rate 11/04/20 1007 (!) 58     Resp 11/04/20 1007 16     Temp 11/04/20 1007 97.9 F (36.6 C)     Temp Source 11/04/20 1007 Oral     SpO2 11/04/20 1007 100 %     Weight 11/04/20 1008 141 lb (64 kg)  Height 11/04/20 1008 5' (1.524 m)     Head Circumference --      Peak Flow --      Pain Score 11/04/20 1008 5     Pain Loc --      Pain Edu? --      Excl. in [place]? --      Physical Exam Vitals and nursing note reviewed.  Constitutional:      General: She is not in acute distress.    Appearance: She is well-developed.  HENT:     Head: Normocephalic and atraumatic.     Comments: Moderate tenderness to the posterior left parietal occipital scalp, with hematoma.  No open wounds. Eyes:     Conjunctiva/sclera: Conjunctivae normal.  Cardiovascular:     Rate and Rhythm: Normal rate and regular rhythm.     Heart sounds: Normal heart sounds. No murmur heard. No friction rub.  Pulmonary:     Effort: Pulmonary effort is normal. No respiratory distress.     Breath sounds: Normal breath sounds. No wheezing or rales.  Abdominal:     General: There is no distension.     Palpations: Abdomen is soft.     Tenderness: There is no abdominal tenderness.  Musculoskeletal:     Cervical back: Neck supple.     Comments: Mild midline sacral tenderness.  Skin:    General: Skin is warm.     Capillary Refill: Capillary refill takes less than 2 seconds.  Neurological:     Mental Status: She is alert and oriented to person, place, and time.     Motor: No abnormal muscle tone.     Comments: Strength out of 5  bilateral upper and lower extremities.  Normal sensation to light touch.       ____________________________________________   LABS (all labs ordered are listed, but only abnormal results are displayed)  Labs Reviewed - No data to display  ____________________________________________  EKG:  ________________________________________  RADIOLOGY All imaging, including plain films, CT scans, and ultrasounds, independently reviewed by me, and interpretations confirmed via formal radiology reads.  ED MD interpretation:   CT Head: Left posterior scalp hematoma CT C Spine: Negative CT Pelvis: Negative   Official radiology report(s): CT Head Wo Contrast  Result Date: 11/04/2020 CLINICAL DATA:  87 year old female status post fall striking the back of the head. Pain. EXAM: CT HEAD WITHOUT CONTRAST TECHNIQUE: Contiguous axial images were obtained from the base of the skull through the vertex without intravenous contrast. COMPARISON:  Brain MRI 04/10/2013.  Head CT 10/25/2020 and earlier. FINDINGS: Brain: No midline shift, ventriculomegaly, mass effect, evidence of mass lesion, intracranial hemorrhage or evidence of cortically based acute infarction. Gray-white matter differentiation is stable and normal for age. Vascular: Tortuous intracranial arteries. Calcified atherosclerosis at the skull base. No suspicious intracranial vascular hyperdensity. Skull: Stable, intact. Sinuses/Orbits: Unchanged small low-density fluid level in the right sphenoid sinus, inflammatory. Other Visualized paranasal sinuses and mastoids are clear. Tympanic cavities remain clear. Other: Broad-based left posterior convexity scalp hematoma on series 3, image 46. Underlying calvarium appear stable and intact. No other orbit or scalp soft tissue injury identified. IMPRESSION: 1. Left posterior convexity scalp hematoma without underlying skull fracture. 2. Stable and normal for age non contrast CT appearance of the brain.  Electronically Signed   By: Genevie Ann M.D.   On: 11/04/2020 11:54   CT Cervical Spine Wo Contrast  Result Date: 11/04/2020 CLINICAL DATA:  87 year old female status post fall striking the back of the  head. Pain. EXAM: CT CERVICAL SPINE WITHOUT CONTRAST TECHNIQUE: Multidetector CT imaging of the cervical spine was performed without intravenous contrast. Multiplanar CT image reconstructions were also generated. COMPARISON:  Head CT today.  No prior cervical spine imaging. FINDINGS: Alignment: Straightening and mild reversal of cervical lordosis. Dextroconvex cervical scoliosis. Degenerative appearing anterolisthesis of C3 on C4 measuring 2-3 mm. Cervicothoracic junction alignment is within normal limits. Bilateral posterior element alignment is within normal limits. Skull base and vertebrae: Visualized skull base is intact. No atlanto-occipital dissociation. C1 and C2 appear intact with maintained alignment. Nutrient foramen incidentally noted through the left C2 lamina on series 10, image 23. No acute osseous abnormality identified. Soft tissues and spinal canal: No prevertebral fluid or swelling. No visible canal hematoma. Retropharyngeal course of both carotids. Mild thyroid enlargement, hypodense right thyroid nodule. In the setting of significant comorbidities or limited life expectancy, no follow-up recommended (ref: J Am Coll Radiol. 2015 Feb;12(2): 143-50). Disc levels: Degenerative appearing anterolisthesis of C3 on C4 with moderate to severe associated left side facet arthropathy. Chronic severe disc and endplate degeneration Z6-X0 through C6-C7, with mild to moderate facet degeneration also at those levels. But no spinal stenosis suspected by CT. Upper chest: Osteopenia, visible upper thoracic levels appear intact. Negative lung apices. Partially visible left subclavian region pacemaker type leads. Other: None. IMPRESSION: 1. No acute traumatic injury identified in the cervical spine. 2. Advanced cervical  spine degeneration, with dextroconvex cervical scoliosis and spondylolisthesis at C3-C4. Electronically Signed   By: Genevie Ann M.D.   On: 11/04/2020 11:59   CT Lumbar Spine Wo Contrast  Result Date: 11/04/2020 CLINICAL DATA:  Low back and pelvic pain after a fall today. Initial encounter. EXAM: CT LUMBAR SPINE WITHOUT CONTRAST TECHNIQUE: Multidetector CT imaging of the lumbar spine was performed without intravenous contrast administration. Multiplanar CT image reconstructions were also generated. COMPARISON:  CT abdomen and pelvis 10/25/2020. FINDINGS: Segmentation: Standard. Alignment: 0.7 cm anterolisthesis L4 on L5 and convex right scoliosis are unchanged. Vertebrae: No fracture or focal lesion. Paraspinal and other soft tissues: No acute abnormality. Extensive atherosclerotic vascular disease without aneurysm. Disc levels: Multilevel degenerative disease is unchanged in appearance. Spondylosis appears worst at L4-5 where there is advanced facet degenerative disease, ligamentum flavum thickening and bulging disc resulting in moderate to moderately severe central canal stenosis and right worse than left foraminal narrowing. IMPRESSION: No acute abnormality. No change in multilevel spondylosis appearing worst at L4-5. Aortic Atherosclerosis (ICD10-I70.0). Electronically Signed   By: Inge Rise M.D.   On: 11/04/2020 11:52   CT PELVIS WO CONTRAST  Result Date: 11/04/2020 CLINICAL DATA:  Pelvic trauma, fell today landing on LEFT hip and pelvis then fell in backwards, neck pain, back pain EXAM: CT PELVIS WITHOUT CONTRAST TECHNIQUE: Multidetector CT imaging of the pelvis was performed following the standard protocol without intravenous contrast. Sagittal and coronal MPR images reconstructed from axial data set. COMPARISON:  None FINDINGS: Urinary Tract: Inferior poles of kidneys normal appearance. No hydronephrosis, ureteral calcification, or ureteral dilatation. Bladder unremarkable. Bowel: Extensive  diverticulosis of sigmoid colon and minimally at distal descending colon. No evidence of diverticulitis. Appendix normal appearance. Small bowel loops unremarkable. Vascular/Lymphatic: Atherosclerotic calcification spine aorta and iliac arteries without aneurysm. Scattered pelvic phleboliths. Reproductive: Uterus surgically absent. Nonvisualization of ovaries. Other: No free air or free fluid. Small umbilical hernia containing fat. Musculoskeletal: Osseous demineralization. Degenerative changes at pubic symphysis. Narrowing of BILATERAL hip joints. No proximal femoral or pelvic fracture. Degenerative disc and facet disease changes of  visualized lumbar spine. Grade 1 anterolisthesis L4-L5 due to degenerative disc and facet disease. IMPRESSION: No acute pelvic abnormalities. Distal colonic diverticulosis without evidence of diverticulitis. Small umbilical hernia containing fat. Degenerative disc and facet disease changes of lower lumbar spine. Aortic Atherosclerosis (ICD10-I70.0). Electronically Signed   By: Lavonia Dana M.D.   On: 11/04/2020 12:01    ____________________________________________  PROCEDURES   Procedure(s) performed (including Critical Care):  Procedures  ____________________________________________  INITIAL IMPRESSION / MDM / [place] / ED COURSE  As part of my medical decision making, I reviewed the following data within the [place] notes reviewed and incorporated, Old chart reviewed, Notes from prior ED visits, and [place] Controlled Substance Database       *MAHLIA FERNANDO was evaluated in Emergency Department on 11/04/2020 for the symptoms described in the history of present illness. She was evaluated in the context of the global COVID-19 pandemic, which necessitated consideration that the patient might be at risk for infection with the SARS-CoV-2 virus that causes COVID-19. Institutional protocols and algorithms that pertain to the  evaluation of patients at risk for COVID-19 are in a state of rapid change based on information released by regulatory bodies including the CDC and federal and state organizations. These policies and algorithms were followed during the patient's care in the ED.  Some ED evaluations and interventions may be delayed as a result of limited staffing during the pandemic.*     Medical Decision Making:  87 yo F here with mechanical fall. H/o falls 2/2 knee instability and pain. On exam, pt is in NAD and well appearing. No signs of basilar skull fx. Imaging obtained of areas of pain. CT Head/C Spine neg. CT L spine and pelvis negative, no hip fx. Pt states she was well prior to fall. Of note, this began 2/2 history knee instability for which she has been seen by ortho x 2. Based on this and the fact that she fell 2/2 instability of her L knee, I have ordered a bent knee brace which was fitted for her. She is able to ambulate w/ walker with brace in place. Will go home w/ daughter. Return precautions given.  ____________________________________________  FINAL CLINICAL IMPRESSION(S) / ED DIAGNOSES  Final diagnoses:  Fall, initial encounter     MEDICATIONS GIVEN DURING THIS VISIT:  Medications  acetaminophen (TYLENOL) tablet 1,000 mg (1,000 mg Oral Not Given 11/04/20 1317)     ED Discharge Orders    None       Note:  This document was prepared using Dragon voice recognition software and may include unintentional dictation errors.   Duffy Bruce, MD 11/04/20 1517

## 2020-11-04 NOTE — Progress Notes (Signed)
Orthopedic Tech Progress Note Patient Details:  Sue Knapp Sep 26, 1922 [id number] Called order into [facility] Patient ID: Sue Knapp, female   DOB: Mar 10, 1923, 87 y.o.   MRN: [id number]   Sue Knapp 11/04/2020, 1:45 PM

## 2020-11-04 NOTE — ED Triage Notes (Signed)
Patient [place] from [place] at [place]. Patient reports she was standing in the bathroom with her walker when her leg gave out causing her to fall to the ground. Patient reports falling on buttocks and falling backwards hitting her head. Hematoma to back of head. Denies LOC. Takes blood thinners

## 2020-11-04 NOTE — ED Notes (Signed)
pateint said s;he was wet, but she is not.  She has purewicik.  I placed a hinged knee brace on left knee.  She got up tyo toilet and was able to walk around the room with a walker.  She wants to sit up in a chair.

## 2020-11-04 NOTE — ED Notes (Signed)
I have given a food tray and some juice.  She is still in chair with call bell handy.

## 2020-11-07 ENCOUNTER — Telehealth: Payer: Self-pay | Admitting: *Deleted

## 2020-11-07 NOTE — Telephone Encounter (Signed)
Transition Care Management Unsuccessful Follow-up Telephone Call  Date of discharge and from where:  11/04/2020 [facility] ED  Attempts:  1st Attempt  Reason for unsuccessful TCM follow-up call:  No answer/busy

## 2020-11-08 NOTE — Telephone Encounter (Signed)
Transition Care Management Unsuccessful Follow-up Telephone Call  Date of discharge and from where:  11/04/2020 [facility] ED  Attempts:  2nd Attempt  Reason for unsuccessful TCM follow-up call:  No answer/busy

## 2020-11-09 NOTE — Telephone Encounter (Signed)
Transition Care Management Unsuccessful Follow-up Telephone Call  Date of discharge and from where:  11/04/2020 [facility] ED  Attempts:  3rd Attempt  Reason for unsuccessful TCM follow-up call:  No answer/busy

## 2020-11-17 NOTE — Progress Notes (Signed)
Established Patient Office Visit  SUBJECTIVE:  Subjective  Patient ID: Sue Knapp, female    DOB: [birth year]  Age: 87 y.o. MRN: [id number]  CC:  Chief Complaint  Patient presents with  . Follow-up    HPI CLEMENCE Knapp is a 87 y.o. female presenting today for     Past Medical History:  Diagnosis Date  . Arthritis   . Breast cancer ([place]) 2013   left breast, radiation and lumpectomy  . Cataract   . Depression   . Fatigue   . GERD (gastroesophageal reflux disease)   . Glaucoma   . Heart murmur   . Heart palpitations   . Hiatal hernia   . HOH (hard of hearing)    AIDS  . Hyperlipidemia   . Hypertension   . Hypothyroidism   . Left ankle injury    Old left ankle injury  . Left bundle branch block   . Neuropathy   . Osteoporosis   . Pacemaker   . Palpitations   . Personal history of radiation therapy 2013   left breast ca  . Scoliosis   . Shortness of breath dyspnea   . Thyroid nodule   . TIA (transient ischemic attack)     Past Surgical History:  Procedure Laterality Date  . ABDOMINAL HYSTERECTOMY    . BREAST BIOPSY Left 2005   bengin  . BREAST BIOPSY Left 2013   stereo bx, invasive tubular carcinoma and DCIS  . 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 BREAST SURGERY     LUMPECTOMY  . CATARACT EXTRACTION W/PHACO Right 03/01/2015   Procedure: CATARACT EXTRACTION PHACO AND INTRAOCULAR LENS PLACEMENT (IOC);  Surgeon: Birder Robson, MD;  Location: [facility] ORS;  Service: Ophthalmology;  Laterality: Right;  US:1:03.1 AP:22.8 CDE:14.37  Fluid lot # [id number]  . CATARACT EXTRACTION W/PHACO Left 03/22/2015   Procedure: CATARACT EXTRACTION PHACO AND INTRAOCULAR LENS PLACEMENT (IOC);  Surgeon: Birder Robson, MD;  Location: [facility] ORS;  Service: Ophthalmology;  Laterality: Left;  [place]: 01:01.0 AP%: 22.5% CDE: 13.75 Lot # [id number]  . EYE SURGERY  09/2020 and 10/2020  . MASTECTOMY PARTIAL /  LUMPECTOMY    . THYROIDECTOMY, PARTIAL      Family History  Problem Relation Age of Onset  . Breast cancer Neg Hx     Social History   Socioeconomic History  . Marital status: Widowed    Spouse name: Not on file  . Number of children: Not on file  . Years of education: Not on file  . Highest education level: Not on file  Occupational History  . Not on file  Tobacco Use  . Smoking status: Never Smoker  . Smokeless tobacco: Never Used  Substance and Sexual Activity  . Alcohol use: No  . Drug use: Yes    Comment: pain clinic: tramadol  . Sexual activity: Not on file  Other Topics Concern  . Not on file  Social History Narrative  . Not on file   Social Determinants of Health   Financial Resource Strain: Not on file  Food Insecurity: Not on file  Transportation Needs: Not on file  Physical Activity: Not on file  Stress: Not on file  Social Connections: Not on file  Intimate Partner Violence: Not on file     Current Outpatient Medications:  .  acetaminophen (TYLENOL) 650 MG CR tablet, Take 1,300 mg by mouth 2 (two) times daily., Disp: , Rfl:  .  albuterol (VENTOLIN HFA) 108 (90 Base) MCG/ACT inhaler, Inhale 2 puffs into the lungs every 6 (six) hours as needed for wheezing or shortness of breath., Disp: 8 g, Rfl: 4 .  apixaban (ELIQUIS) 2.5 MG TABS tablet, Take 1 tablet (2.5 mg total) by mouth 2 (two) times daily., Disp: 60 tablet, Rfl: 6 .  Cholecalciferol 50 MCG (2000 UT) TABS, Take 2,000 Units by mouth daily. , Disp: , Rfl:  .  furosemide (LASIX) 20 MG tablet, Take 1 tablet (20 mg total) by mouth daily for 7 days., Disp: 7 tablet, Rfl: 0 .  furosemide (LASIX) 20 MG tablet, Take 1 tablet (20 mg total) by mouth daily., Disp: 30 tablet, Rfl: 3 .  hydrochlorothiazide (HYDRODIURIL) 25 MG tablet, TAKE 1 TABLET BY MOUTH DAILY, Disp: 30 tablet, Rfl: 6 .  ibuprofen (ADVIL,MOTRIN) 200 MG tablet, Take 200 mg by mouth 2 (two) times daily., Disp: , Rfl:  .  losartan (COZAAR) 100  MG tablet, TAKE 1 TABLET BY MOUTH DAILY, Disp: 90 tablet, Rfl: 1 .  metoprolol succinate (TOPROL-XL) 50 MG 24 hr tablet, TAKE 1 TABLET BY MOUTH DAILY, Disp: 30 tablet, Rfl: 6 .  pregabalin (LYRICA) 50 MG capsule, TAKE 1 CAPSULE 3 TIMES DAILY, Disp: 90 capsule, Rfl: 3 .  docusate sodium (COLACE) 100 MG capsule, Take 1-2 capsules (100-200 mg total) by mouth at bedtime as needed for moderate constipation., Disp: 60 capsule, Rfl: 5 .  traMADol (ULTRAM-ER) 100 MG 24 hr tablet, Take 1 tablet (100 mg total) by mouth daily., Disp: 30 tablet, Rfl: 1   Allergies  Allergen Reactions  . Codeine Other (See Comments)  . Etodolac Other (See Comments)  . Zolpidem Other (See Comments)    ROS Review of Systems  Constitutional: Negative.   HENT: Negative.   Respiratory: Positive for shortness of breath.   Cardiovascular: Negative.   Gastrointestinal: Negative.   Musculoskeletal: Negative.   Neurological: Negative.   Psychiatric/Behavioral: Negative.      OBJECTIVE:    Physical Exam Vitals and nursing note reviewed.  HENT:     Nose: Nose normal.     Mouth/Throat:     Mouth: Mucous membranes are moist.  Cardiovascular:     Rate and Rhythm: Normal rate and regular rhythm.  Pulmonary:     Effort: Pulmonary effort is normal. No respiratory distress.     Breath sounds: Wheezing present. No rhonchi or rales.  Musculoskeletal:        General: Normal range of motion.     Cervical back: Normal range of motion.  Skin:    General: Skin is warm.  Neurological:     Mental Status: She is alert.  Psychiatric:        Mood and Affect: Mood normal.     BP 124/62   Pulse 60   Ht 4\' 11"  (1.499 m)   Wt 141 lb 1.6 oz (64 kg)   BMI 28.50 kg/m  Wt Readings from Last 3 Encounters:  11/04/20 141 lb (64 kg)  11/03/20 140 lb (63.5 kg)  10/31/20 141 lb 1.6 oz (64 kg)    Health Maintenance Due  Topic Date Due  . COVID-19 Vaccine (1) Never done  . PNA vac Low Risk Adult (2 of 2 - PCV13) 05/10/2016     There are no preventive care reminders to display for this patient.  CBC Latest Ref Rng & Units 10/25/2020 03/03/2020 09/14/2019  WBC 4.0 - 10.5 K/uL 6.7 6.3 7.2  Hemoglobin 12.0 - 15.0 g/dL 10.9(L) 12.2  12.5  Hematocrit 36.0 - 46.0 % 34.2(L) 37.9 37.7  Platelets 150 - 400 K/uL 261 272 351   CMP Latest Ref Rng & Units 10/25/2020 09/14/2019 06/09/2013  Glucose 70 - 99 mg/dL 108(H) 103(H) 135(H)  BUN 8 - 23 mg/dL 46(H) 35(H) 23(H)  Creatinine 0.44 - 1.00 mg/dL 1.00 0.85 0.99  Sodium 135 - 145 mmol/L 141 140 138  Potassium 3.5 - 5.1 mmol/L 4.0 3.7 3.9  Chloride 98 - 111 mmol/L 107 102 105  CO2 22 - 32 mmol/L 25 28 28   Calcium 8.9 - 10.3 mg/dL 9.3 9.4 9.0  Total Protein 6.5 - 8.1 g/dL 6.5 6.7 -  Total Bilirubin 0.3 - 1.2 mg/dL 1.0 0.6 -  Alkaline Phos 38 - 126 U/L 84 64 -  AST 15 - 41 U/L 50(H) 21 -  ALT 0 - 44 U/L 68(H) 13 -    Lab Results  Component Value Date   TSH 1.84 05/23/2013   Lab Results  Component Value Date   ALBUMIN 3.9 10/25/2020   ANIONGAP 9 10/25/2020   Lab Results  Component Value Date   CHOL 214 (H) 04/11/2013   HDL 65 (H) 04/11/2013   LDLCALC 131 (H) 04/11/2013   Lab Results  Component Value Date   TRIG 89 04/11/2013   No results found for: HGBA1C    ASSESSMENT & PLAN:   Problem List Items Addressed This Visit      Other   Shortness of breath - Primary    Pt sob x 2 weeks, her daughter reports taking her to the ED and having a complete workup with Chest CT, Abd CT D dimer which were all neg. They sent her home on 20 mg of Lasix and called PCP and Cardiology Dr Lavera Guise. Dr Lavera Guise wants her to continue Lasix. SOB with mild exertion improved with rest.  Plan- Continue Lasix, exam neg today for wheezing or rhales. She is also going to FU with GI for possible GERD involvement.          Meds ordered this encounter  Medications  . furosemide (LASIX) 20 MG tablet    Sig: Take 1 tablet (20 mg total) by mouth daily.    Dispense:  30 tablet     Refill:  3      Follow-up: No follow-ups on file.    Beckie Salts, [place] [address], [place], [place] [address]

## 2020-11-17 NOTE — Assessment & Plan Note (Signed)
Pt sob x 2 weeks, her daughter reports taking her to the ED and having a complete workup with Chest CT, Abd CT D dimer which were all neg. They sent her home on 20 mg of Lasix and called PCP and Cardiology Dr Lavera Guise. Dr Lavera Guise wants her to continue Lasix. SOB with mild exertion improved with rest.  Plan- Continue Lasix, exam neg today for wheezing or rhales. She is also going to FU with GI for possible GERD involvement.

## 2020-11-28 ENCOUNTER — Other Ambulatory Visit: Payer: Self-pay

## 2020-11-28 ENCOUNTER — Ambulatory Visit (INDEPENDENT_AMBULATORY_CARE_PROVIDER_SITE_OTHER): Payer: Medicare Other | Admitting: Internal Medicine

## 2020-11-28 DIAGNOSIS — R0602 Shortness of breath: Secondary | ICD-10-CM | POA: Diagnosis not present

## 2020-11-28 DIAGNOSIS — Z950 Presence of cardiac pacemaker: Secondary | ICD-10-CM

## 2020-11-28 DIAGNOSIS — I4891 Unspecified atrial fibrillation: Secondary | ICD-10-CM

## 2020-11-28 DIAGNOSIS — M12812 Other specific arthropathies, not elsewhere classified, left shoulder: Secondary | ICD-10-CM

## 2020-11-28 DIAGNOSIS — M1711 Unilateral primary osteoarthritis, right knee: Secondary | ICD-10-CM

## 2020-11-28 DIAGNOSIS — R1319 Other dysphagia: Secondary | ICD-10-CM | POA: Diagnosis not present

## 2020-11-28 DIAGNOSIS — I10 Essential (primary) hypertension: Secondary | ICD-10-CM | POA: Diagnosis not present

## 2020-11-28 DIAGNOSIS — I495 Sick sinus syndrome: Secondary | ICD-10-CM | POA: Diagnosis not present

## 2020-11-28 LAB — POC COVID19 BINAXNOW: SARS Coronavirus 2 Ag: NEGATIVE

## 2020-11-28 MED ORDER — METHYLPREDNISOLONE 4 MG PO TBPK: ORAL_TABLET | ORAL | 0 refills | Status: DC

## 2020-11-28 MED ORDER — AZITHROMYCIN 250 MG PO TABS: ORAL_TABLET | ORAL | 0 refills | Status: DC

## 2020-11-30 ENCOUNTER — Other Ambulatory Visit: Payer: Self-pay | Admitting: Internal Medicine

## 2020-11-30 LAB — COMPLETE METABOLIC PANEL WITH GFR
AG Ratio: 2 (calc) (ref 1.0–2.5)
ALT: 43 U/L — ABNORMAL HIGH (ref 6–29)
AST: 31 U/L (ref 10–35)
Albumin: 3.9 g/dL (ref 3.6–5.1)
Alkaline phosphatase (APISO): 104 U/L (ref 37–153)
BUN/Creatinine Ratio: 31 (calc) — ABNORMAL HIGH (ref 6–22)
BUN: 43 mg/dL — ABNORMAL HIGH (ref 7–25)
CO2: 27 mmol/L (ref 20–32)
Calcium: 9.6 mg/dL (ref 8.6–10.4)
Chloride: 103 mmol/L (ref 98–110)
Creat: 1.39 mg/dL — ABNORMAL HIGH (ref 0.60–0.88)
GFR, Est African American: 37 mL/min/{1.73_m2} — ABNORMAL LOW (ref >=60)
GFR, Est Non African American: 32 mL/min/{1.73_m2} — ABNORMAL LOW (ref >=60)
Globulin: 2 g/dL (calc) (ref 1.9–3.7)
Glucose, Bld: 80 mg/dL (ref 65–99)
Potassium: 4.3 mmol/L (ref 3.5–5.3)
Sodium: 142 mmol/L (ref 135–146)
Total Bilirubin: 0.7 mg/dL (ref 0.2–1.2)
Total Protein: 5.9 g/dL — ABNORMAL LOW (ref 6.1–8.1)

## 2020-11-30 LAB — CBC WITH DIFFERENTIAL/PLATELET
Absolute Monocytes: 577 cells/uL (ref 200–950)
Basophils Absolute: 39 cells/uL (ref 0–200)
Basophils Relative: 0.5 %
Eosinophils Absolute: 8 cells/uL — ABNORMAL LOW (ref 15–500)
Eosinophils Relative: 0.1 %
HCT: 36.1 % (ref 35.0–45.0)
Hemoglobin: 11.4 g/dL — ABNORMAL LOW (ref 11.7–15.5)
Lymphs Abs: 663 cells/uL — ABNORMAL LOW (ref 850–3900)
MCH: 28.1 pg (ref 27.0–33.0)
MCHC: 31.6 g/dL — ABNORMAL LOW (ref 32.0–36.0)
MCV: 89.1 fL (ref 80.0–100.0)
MPV: 10.8 fL (ref 7.5–12.5)
Monocytes Relative: 7.4 %
Neutro Abs: 6513 cells/uL (ref 1500–7800)
Neutrophils Relative %: 83.5 %
Platelets: 289 10*3/uL (ref 140–400)
RBC: 4.05 10*6/uL (ref 3.80–5.10)
RDW: 14.4 % (ref 11.0–15.0)
Total Lymphocyte: 8.5 %
WBC: 7.8 10*3/uL (ref 3.8–10.8)

## 2020-12-01 ENCOUNTER — Inpatient Hospital Stay
Admission: EM | Admit: 2020-12-01 | Discharge: 2020-12-04 | DRG: 291 | Disposition: A | Discharge status: Home Health | Payer: Medicare Other | Attending: Internal Medicine | Admitting: Internal Medicine

## 2020-12-01 ENCOUNTER — Emergency Department: Payer: Medicare Other

## 2020-12-01 ENCOUNTER — Other Ambulatory Visit: Payer: Self-pay

## 2020-12-01 DIAGNOSIS — G894 Chronic pain syndrome: Secondary | ICD-10-CM | POA: Diagnosis present

## 2020-12-01 DIAGNOSIS — Z853 Personal history of malignant neoplasm of breast: Secondary | ICD-10-CM

## 2020-12-01 DIAGNOSIS — K219 Gastro-esophageal reflux disease without esophagitis: Secondary | ICD-10-CM | POA: Diagnosis present

## 2020-12-01 DIAGNOSIS — Z950 Presence of cardiac pacemaker: Secondary | ICD-10-CM | POA: Diagnosis not present

## 2020-12-01 DIAGNOSIS — E890 Postprocedural hypothyroidism: Secondary | ICD-10-CM | POA: Diagnosis present

## 2020-12-01 DIAGNOSIS — I509 Heart failure, unspecified: Secondary | ICD-10-CM | POA: Diagnosis not present

## 2020-12-01 DIAGNOSIS — H409 Unspecified glaucoma: Secondary | ICD-10-CM | POA: Diagnosis present

## 2020-12-01 DIAGNOSIS — Z20822 Contact with and (suspected) exposure to covid-19: Secondary | ICD-10-CM | POA: Diagnosis present

## 2020-12-01 DIAGNOSIS — N1831 Chronic kidney disease, stage 3a: Secondary | ICD-10-CM | POA: Diagnosis present

## 2020-12-01 DIAGNOSIS — M810 Age-related osteoporosis without current pathological fracture: Secondary | ICD-10-CM | POA: Diagnosis present

## 2020-12-01 DIAGNOSIS — Z8673 Personal history of transient ischemic attack (TIA), and cerebral infarction without residual deficits: Secondary | ICD-10-CM

## 2020-12-01 DIAGNOSIS — I495 Sick sinus syndrome: Secondary | ICD-10-CM | POA: Diagnosis present

## 2020-12-01 DIAGNOSIS — M419 Scoliosis, unspecified: Secondary | ICD-10-CM | POA: Diagnosis present

## 2020-12-01 DIAGNOSIS — R1310 Dysphagia, unspecified: Secondary | ICD-10-CM | POA: Diagnosis present

## 2020-12-01 DIAGNOSIS — Z888 Allergy status to other drugs, medicaments and biological substances status: Secondary | ICD-10-CM

## 2020-12-01 DIAGNOSIS — R531 Weakness: Secondary | ICD-10-CM

## 2020-12-01 DIAGNOSIS — Z7901 Long term (current) use of anticoagulants: Secondary | ICD-10-CM

## 2020-12-01 DIAGNOSIS — R0602 Shortness of breath: Secondary | ICD-10-CM | POA: Diagnosis not present

## 2020-12-01 DIAGNOSIS — I5033 Acute on chronic diastolic (congestive) heart failure: Secondary | ICD-10-CM | POA: Diagnosis present

## 2020-12-01 DIAGNOSIS — I340 Nonrheumatic mitral (valve) insufficiency: Secondary | ICD-10-CM | POA: Diagnosis present

## 2020-12-01 DIAGNOSIS — Z923 Personal history of irradiation: Secondary | ICD-10-CM

## 2020-12-01 DIAGNOSIS — I480 Paroxysmal atrial fibrillation: Secondary | ICD-10-CM | POA: Diagnosis present

## 2020-12-01 DIAGNOSIS — K449 Diaphragmatic hernia without obstruction or gangrene: Secondary | ICD-10-CM | POA: Diagnosis present

## 2020-12-01 DIAGNOSIS — M19011 Primary osteoarthritis, right shoulder: Secondary | ICD-10-CM | POA: Diagnosis present

## 2020-12-01 DIAGNOSIS — B20 Human immunodeficiency virus [HIV] disease: Secondary | ICD-10-CM | POA: Diagnosis present

## 2020-12-01 DIAGNOSIS — G629 Polyneuropathy, unspecified: Secondary | ICD-10-CM | POA: Diagnosis present

## 2020-12-01 DIAGNOSIS — I130 Hypertensive heart and chronic kidney disease with heart failure and stage 1 through stage 4 chronic kidney disease, or unspecified chronic kidney disease: Secondary | ICD-10-CM | POA: Diagnosis not present

## 2020-12-01 DIAGNOSIS — E785 Hyperlipidemia, unspecified: Secondary | ICD-10-CM | POA: Diagnosis present

## 2020-12-01 DIAGNOSIS — I447 Left bundle-branch block, unspecified: Secondary | ICD-10-CM | POA: Diagnosis present

## 2020-12-01 DIAGNOSIS — N179 Acute kidney failure, unspecified: Secondary | ICD-10-CM | POA: Diagnosis present

## 2020-12-01 DIAGNOSIS — Z79899 Other long term (current) drug therapy: Secondary | ICD-10-CM

## 2020-12-01 DIAGNOSIS — Z885 Allergy status to narcotic agent status: Secondary | ICD-10-CM

## 2020-12-01 DIAGNOSIS — M170 Bilateral primary osteoarthritis of knee: Secondary | ICD-10-CM | POA: Diagnosis present

## 2020-12-01 DIAGNOSIS — H9190 Unspecified hearing loss, unspecified ear: Secondary | ICD-10-CM | POA: Diagnosis present

## 2020-12-01 LAB — RESP PANEL BY RT-PCR (FLU A&B, COVID) ARPGX2
Influenza A by PCR: NEGATIVE
Influenza B by PCR: NEGATIVE
SARS Coronavirus 2 by RT PCR: NEGATIVE

## 2020-12-01 LAB — CBC
HCT: 35.1 % — ABNORMAL LOW (ref 36.0–46.0)
Hemoglobin: 11.3 g/dL — ABNORMAL LOW (ref 12.0–15.0)
MCH: 29 pg (ref 26.0–34.0)
MCHC: 32.2 g/dL (ref 30.0–36.0)
MCV: 90 fL (ref 80.0–100.0)
Platelets: 249 10*3/uL (ref 150–400)
RBC: 3.9 MIL/uL (ref 3.87–5.11)
RDW: 16.5 % — ABNORMAL HIGH (ref 11.5–15.5)
WBC: 8 10*3/uL (ref 4.0–10.5)
nRBC: 0.2 % (ref 0.0–0.2)

## 2020-12-01 LAB — COMPREHENSIVE METABOLIC PANEL
ALT: 41 U/L (ref 0–44)
AST: 35 U/L (ref 15–41)
Albumin: 3.7 g/dL (ref 3.5–5.0)
Alkaline Phosphatase: 92 U/L (ref 38–126)
Anion gap: 12 (ref 5–15)
BUN: 48 mg/dL — ABNORMAL HIGH (ref 8–23)
CO2: 26 mmol/L (ref 22–32)
Calcium: 8.8 mg/dL — ABNORMAL LOW (ref 8.9–10.3)
Chloride: 102 mmol/L (ref 98–111)
Creatinine, Ser: 1.37 mg/dL — ABNORMAL HIGH (ref 0.44–1.00)
GFR, Estimated: 36 mL/min — ABNORMAL LOW (ref >60)
Glucose, Bld: 155 mg/dL — ABNORMAL HIGH (ref 70–99)
Potassium: 3.6 mmol/L (ref 3.5–5.1)
Sodium: 140 mmol/L (ref 135–145)
Total Bilirubin: 0.8 mg/dL (ref 0.3–1.2)
Total Protein: 6 g/dL — ABNORMAL LOW (ref 6.5–8.1)

## 2020-12-01 LAB — TROPONIN I (HIGH SENSITIVITY)
Troponin I (High Sensitivity): 28 ng/L — ABNORMAL HIGH (ref <18)
Troponin I (High Sensitivity): 32 ng/L — ABNORMAL HIGH (ref <18)

## 2020-12-01 LAB — BRAIN NATRIURETIC PEPTIDE: B Natriuretic Peptide: 834.2 pg/mL — ABNORMAL HIGH (ref 0.0–100.0)

## 2020-12-01 LAB — MAGNESIUM: Magnesium: 1.7 mg/dL (ref 1.7–2.4)

## 2020-12-01 MED ORDER — ONDANSETRON HCL 4 MG PO TABS
4.0000 mg | ORAL_TABLET | Freq: Four times a day (QID) | ORAL | Status: DC | PRN
  Administered 2020-12-02: 4 mg via ORAL
  Filled 2020-12-01: qty 1

## 2020-12-01 MED ORDER — ALBUTEROL SULFATE HFA 108 (90 BASE) MCG/ACT IN AERS
2.0000 | INHALATION_SPRAY | Freq: Four times a day (QID) | RESPIRATORY_TRACT | Status: DC | PRN
  Filled 2020-12-01: qty 6.7

## 2020-12-01 MED ORDER — DOCUSATE SODIUM 100 MG PO CAPS
100.0000 mg | ORAL_CAPSULE | Freq: Every evening | ORAL | Status: DC | PRN
Start: 2020-12-01 — End: 2020-12-04

## 2020-12-01 MED ORDER — ONDANSETRON HCL 4 MG/2ML IJ SOLN: 4.0000 mg | Freq: Four times a day (QID) | INTRAMUSCULAR | Status: DC | PRN

## 2020-12-01 MED ORDER — SENNOSIDES-DOCUSATE SODIUM 8.6-50 MG PO TABS
1.0000 | ORAL_TABLET | Freq: Every evening | ORAL | Status: DC | PRN
  Administered 2020-12-01: 1 via ORAL
  Filled 2020-12-01: qty 1

## 2020-12-01 MED ORDER — PREGABALIN 50 MG PO CAPS
50.0000 mg | ORAL_CAPSULE | Freq: Three times a day (TID) | ORAL | Status: DC
  Administered 2020-12-01 – 2020-12-03 (×7): 50 mg via ORAL
  Filled 2020-12-01 (×7): qty 1

## 2020-12-01 MED ORDER — FUROSEMIDE 10 MG/ML IJ SOLN
40.0000 mg | Freq: Once | INTRAMUSCULAR | Status: AC
  Administered 2020-12-01: 40 mg via INTRAVENOUS
  Filled 2020-12-01: qty 4

## 2020-12-01 MED ORDER — VITAMIN D 25 MCG (1000 UNIT) PO TABS
2000.0000 [IU] | ORAL_TABLET | Freq: Every day | ORAL | Status: DC
  Administered 2020-12-02 – 2020-12-04 (×3): 2000 [IU] via ORAL
  Filled 2020-12-01 (×3): qty 2

## 2020-12-01 MED ORDER — TRAMADOL HCL ER 100 MG PO TB24: 100.0000 mg | ORAL_TABLET | Freq: Every day | ORAL | Status: DC

## 2020-12-01 MED ORDER — APIXABAN 2.5 MG PO TABS
2.5000 mg | ORAL_TABLET | Freq: Two times a day (BID) | ORAL | Status: DC
  Administered 2020-12-01: 2.5 mg via ORAL
  Filled 2020-12-01: qty 1

## 2020-12-01 MED ORDER — METOPROLOL SUCCINATE ER 50 MG PO TB24: 50.0000 mg | ORAL_TABLET | Freq: Every day | ORAL | Status: DC

## 2020-12-01 MED ORDER — ACETAMINOPHEN 500 MG PO TABS
1000.0000 mg | ORAL_TABLET | Freq: Two times a day (BID) | ORAL | Status: DC
  Administered 2020-12-01 – 2020-12-03 (×5): 1000 mg via ORAL
  Filled 2020-12-01 (×5): qty 2

## 2020-12-01 MED ORDER — FUROSEMIDE 10 MG/ML IJ SOLN
20.0000 mg | Freq: Every day | INTRAMUSCULAR | Status: DC
  Administered 2020-12-02: 20 mg via INTRAVENOUS
  Filled 2020-12-01: qty 4

## 2020-12-01 MED ORDER — LOSARTAN POTASSIUM 50 MG PO TABS
100.0000 mg | ORAL_TABLET | Freq: Every day | ORAL | Status: DC
  Administered 2020-12-02 – 2020-12-03 (×2): 100 mg via ORAL
  Filled 2020-12-01 (×2): qty 2

## 2020-12-01 NOTE — ED Triage Notes (Signed)
Pt states she is having SOB since off and on since March,  and difficulty swallowing, feels like food gets stuck. Pt states she is having a hard time sleeping. Pt has hx of afib and CHF

## 2020-12-01 NOTE — ED Provider Notes (Signed)
[facility] Emergency Department Provider Note  ____________________________________________  Time seen: Approximately 6:57 PM  I have reviewed the triage vital signs and the nursing notes.   HISTORY  Chief Complaint Shortness of Breath    HPI Sue Knapp is a 87 y.o. female with a history of depression, GERD, hyperlipidemia, hypertension who complains of difficulty swallowing for the past 2 to 3 months.  She reports that she has to chew her food very very well and still feels like he gets stuck in her lower chest, and after she drinks a little bit of liquid she can feel it starting to pass through.  This is a constant issue.  She tolerates liquids better than solids and thick consistencies.  It is also associated with a feeling of shortness of breath.  She has been compliant with her medications and able to continue taking them as prescribed.  Denies chest pain or exertional symptoms.  No palpitations or dizziness.   Patient endorses orthopnea.   Past Medical History:  Diagnosis Date  . Arthritis   . Breast cancer ([place]) 2013   left breast, radiation and lumpectomy  . Cataract   . Depression   . Fatigue   . GERD (gastroesophageal reflux disease)   . Glaucoma   . Heart murmur   . Heart palpitations   . Hiatal hernia   . HOH (hard of hearing)    AIDS  . Hyperlipidemia   . Hypertension   . Hypothyroidism   . Left ankle injury    Old left ankle injury  . Left bundle branch block   . Neuropathy   . Osteoporosis   . Pacemaker   . Palpitations   . Personal history of radiation therapy 2013   left breast ca  . Scoliosis   . Shortness of breath dyspnea   . Thyroid nodule   . TIA (transient ischemic attack)      Patient Active Problem List   Diagnosis Date Noted  . Dysphagia 12/01/2020  . Shortness of breath 10/31/2020  . MP (metacarpophalangeal) joint sprain, initial encounter 04/04/2020  . Dermatitis 04/04/2020  . Rotator cuff  arthropathy of left shoulder 03/03/2020  . Annual physical exam 02/10/2020  . Hx of breast cancer 12/11/2019  . Essential hypertension 12/10/2019  . Pacemaker 11/26/2019  . Sick sinus syndrome due to SA node dysfunction ([place]) 11/26/2019  . Primary osteoarthritis of right knee 03/03/2019  . Primary osteoarthritis of left knee 03/03/2019  . Bilateral primary osteoarthritis of knee 03/03/2019  . Osteoarthritis of right shoulder 03/03/2019  . Chronic pain syndrome 03/03/2019     Past Surgical History:  Procedure Laterality Date  . ABDOMINAL HYSTERECTOMY    . BREAST BIOPSY Left 2005   bengin  . BREAST BIOPSY Left 2013   stereo bx, invasive tubular carcinoma and DCIS  . 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 BREAST SURGERY     LUMPECTOMY  . CATARACT EXTRACTION W/PHACO Right 03/01/2015   Procedure: CATARACT EXTRACTION PHACO AND INTRAOCULAR LENS PLACEMENT (IOC);  Surgeon: Birder Robson, MD;  Location: [facility] ORS;  Service: Ophthalmology;  Laterality: Right;  US:1:03.1 AP:22.8 CDE:14.37  Fluid lot # [id number]  . CATARACT EXTRACTION W/PHACO Left 03/22/2015   Procedure: CATARACT EXTRACTION PHACO AND INTRAOCULAR LENS PLACEMENT (IOC);  Surgeon: Birder Robson, MD;  Location: [facility] ORS;  Service: Ophthalmology;  Laterality: Left;  [place]: 01:01.0 AP%: 22.5% CDE: 13.75 Lot # [id number]  . EYE SURGERY  09/2020 and 10/2020  . MASTECTOMY PARTIAL / LUMPECTOMY    . THYROIDECTOMY, PARTIAL       Prior to Admission medications   Medication Sig Start Date End Date Taking? Authorizing Provider  acetaminophen (TYLENOL) 650 MG CR tablet Take 1,300 mg by mouth 2 (two) times daily.    [provider]  albuterol (VENTOLIN HFA) 108 (90 Base) MCG/ACT inhaler Inhale 2 puffs into the lungs every 6 (six) hours as needed for wheezing or shortness of breath. 09/09/20   Beckie Salts, FNP  azithromycin (ZITHROMAX) 250 MG tablet Take 2 tablets on  day 1, then 1 tablet daily on days 2 through 5 11/28/20 12/03/20  Cletis Athens, MD  Cholecalciferol 50 MCG (2000 UT) TABS Take 2,000 Units by mouth daily.     [provider]  docusate sodium (COLACE) 100 MG capsule Take 1-2 capsules (100-200 mg total) by mouth at bedtime as needed for moderate constipation. 11/03/20 05/02/21  Gillis Santa, MD  ELIQUIS 2.5 MG TABS tablet TAKE 1 TABLET BY MOUTH TWICE DAILY 11/30/20   Cletis Athens, MD  furosemide (LASIX) 20 MG tablet Take 1 tablet (20 mg total) by mouth daily for 7 days. 10/25/20 11/01/20  Lucrezia Starch, MD  furosemide (LASIX) 20 MG tablet Take 1 tablet (20 mg total) by mouth daily. 10/31/20   Beckie Salts, FNP  hydrochlorothiazide (HYDRODIURIL) 25 MG tablet TAKE 1 TABLET BY MOUTH DAILY 09/15/20   Beckie Salts, FNP  losartan (COZAAR) 100 MG tablet TAKE 1 TABLET BY MOUTH DAILY 10/27/20   Beckie Salts, FNP  methylPREDNISolone (MEDROL DOSEPAK) 4 MG TBPK tablet As directed 11/28/20   Cletis Athens, MD  metoprolol succinate (TOPROL-XL) 50 MG 24 hr tablet TAKE 1 TABLET BY MOUTH DAILY 07/11/20   Cletis Athens, MD  pregabalin (LYRICA) 50 MG capsule TAKE 1 CAPSULE 3 TIMES DAILY 10/27/20   Beckie Salts, FNP  traMADol (ULTRAM-ER) 100 MG 24 hr tablet Take 1 tablet (100 mg total) by mouth daily. 11/03/20   Gillis Santa, MD     Allergies Codeine, Etodolac, and Zolpidem   Family History  Problem Relation Age of Onset  . Breast cancer Neg Hx     Social History Social History   Tobacco Use  . Smoking status: Never Smoker  . Smokeless tobacco: Never Used  Substance Use Topics  . Alcohol use: No  . Drug use: Yes    Comment: pain clinic: tramadol    Review of Systems  Constitutional:   No fever or chills.  ENT:   No sore throat. No rhinorrhea. Cardiovascular:   No chest pain or syncope. Respiratory:   Positive shortness of breath without cough. Gastrointestinal:   Negative for abdominal pain, vomiting and diarrhea.  Musculoskeletal:    Positive leg swelling All other systems reviewed and are negative except as documented above in ROS and HPI.  ____________________________________________   PHYSICAL EXAM:  VITAL SIGNS: ED Triage Vitals  Enc Vitals Group     BP 12/01/20 1456 139/70     Pulse Rate 12/01/20 1456 (!) 59     Resp 12/01/20 1456 (!) 22     Temp 12/01/20 1456 98 F (36.7 C)     Temp Source 12/01/20 1456 Oral     SpO2 12/01/20 1456 99 %     Weight 12/01/20 1458 141 lb (64 kg)     Height 12/01/20 1458 5' (1.524 m)     Head Circumference --      Peak Flow --  Pain Score 12/01/20 1457 0     Pain Loc --      Pain Edu? --      Excl. in [place]? --     Vital signs reviewed, nursing assessments reviewed.   Constitutional:   Alert and oriented. Non-toxic appearance. Eyes:   Conjunctivae are normal. EOMI. PERRL. ENT      Head:   Normocephalic and atraumatic.      Nose:   Wearing a mask.      Mouth/Throat:   Wearing a mask.      Neck:   No meningismus. Full ROM. Hematological/Lymphatic/Immunilogical:   No cervical lymphadenopathy. Cardiovascular:   RRR. Symmetric bilateral radial and DP pulses.  No murmurs. Cap refill less than 2 seconds. Respiratory:   Normal respiratory effort without tachypnea/retractions. Breath sounds are clear and equal bilaterally. No wheezes/rales/rhonchi. Gastrointestinal:   Soft and nontender. Non distended. There is no CVA tenderness.  No rebound, rigidity, or guarding. Genitourinary:   deferred Musculoskeletal:   Normal range of motion in all extremities. No joint effusions.  No lower extremity tenderness.  1+ pitting edema bilaterally. Neurologic:   Normal speech and language.  Motor grossly intact. No acute focal neurologic deficits are appreciated.  Skin:    Skin is warm, dry and intact. No rash noted.  No petechiae, purpura, or bullae.  ____________________________________________    LABS (pertinent positives/negatives) (all labs ordered are listed, but only  abnormal results are displayed) Labs Reviewed  CBC - Abnormal; Notable for the following components:      Result Value   Hemoglobin 11.3 (*)    HCT 35.1 (*)    RDW 16.5 (*)    All other components within normal limits  COMPREHENSIVE METABOLIC PANEL - Abnormal; Notable for the following components:   Glucose, Bld 155 (*)    BUN 48 (*)    Creatinine, Ser 1.37 (*)    Calcium 8.8 (*)    Total Protein 6.0 (*)    GFR, Estimated 36 (*)    All other components within normal limits  BRAIN NATRIURETIC PEPTIDE - Abnormal; Notable for the following components:   B Natriuretic Peptide 834.2 (*)    All other components within normal limits  TROPONIN I (HIGH SENSITIVITY) - Abnormal; Notable for the following components:   Troponin I (High Sensitivity) 28 (*)    All other components within normal limits  TROPONIN I (HIGH SENSITIVITY) - Abnormal; Notable for the following components:   Troponin I (High Sensitivity) 32 (*)    All other components within normal limits  MAGNESIUM   ____________________________________________   EKG  Interpreted by me Ventricular paced rhythm, rate of 61.  Left axis, left bundle branch block, no ischemic changes  ____________________________________________    RADIOLOGY  DG Chest 2 View  Result Date: 12/01/2020 CLINICAL DATA:  Shortness of breath EXAM: CHEST - 2 VIEW COMPARISON:  10/25/2020 FINDINGS: Left-sided pacing device as before. Cardiomegaly with small bilateral pleural effusions. Aortic atherosclerosis. No consolidation or pneumothorax. IMPRESSION: Cardiomegaly with small pleural effusions. Electronically Signed   By: Donavan Foil M.D.   On: 12/01/2020 16:14    ____________________________________________   PROCEDURES Procedures  ____________________________________________  DIFFERENTIAL DIAGNOSIS   Esophageal stricture, pleural effusion, pulmonary edema, pneumothorax, GERD  CLINICAL IMPRESSION / ASSESSMENT AND PLAN / ED  COURSE  Medications ordered in the ED: Medications  furosemide (LASIX) injection 40 mg (40 mg Intravenous Given 12/01/20 1659)    Pertinent labs & imaging results that were available during my care of  the patient were reviewed by me and considered in my medical decision making (see chart for details).  Sue Knapp was evaluated in Emergency Department on 12/01/2020 for the symptoms described in the history of present illness. She was evaluated in the context of the global COVID-19 pandemic, which necessitated consideration that the patient might be at risk for infection with the SARS-CoV-2 virus that causes COVID-19. Institutional protocols and algorithms that pertain to the evaluation of patients at risk for COVID-19 are in a state of rapid change based on information released by regulatory bodies including the CDC and federal and state organizations. These policies and algorithms were followed during the patient's care in the ED.   Patient presents with difficulty swallowing, worsening for the last 2 or 3 months.  Vital signs are normal, she does not appear to be significantly dehydrated.  Labs show mild elevation of her creatinine to 1.3 from a baseline of 1.0.  Otherwise unremarkable.  BNP is slightly elevated, troponin is slightly elevated.  Doubt ACS.  Consistent with a worsening of her CHF.  Today's weight is about 3 pounds above her normal weight.  Will give a dose of IV Lasix to begin diuresis.  Oxygenation is normal.  Chest x-ray viewed and interpreted by me, no edema, trace pleural effusions bilaterally, otherwise unremarkable.  Radiology report agrees.  Symptoms discussed with gastroenterology who recommends hospitalization overnight for medical optimization and preparation for EGD tomorrow.  Patient agrees.  Case discussed with hospitalist.      ____________________________________________   FINAL CLINICAL IMPRESSION(S) / ED DIAGNOSES    Final diagnoses:  Dysphagia,  unspecified type  Mild CHF exacerbation   ED Discharge Orders    None      Portions of this note were generated with dragon dictation software. Dictation errors may occur despite best attempts at proofreading.   Carrie Mew, MD 12/01/20 1919

## 2020-12-01 NOTE — H&P (Addendum)
[facility] History and Physical  Sue Knapp ZYS:[id number] DOB: 27-Apr-1923 DOA: 12/01/2020  Referring physician: ED  PCP: Cletis Athens, MD   Patient is coming from: Home  Chief Complaint: Difficulty swallowing  HPI: Sue Knapp is a 87 y.o. female with past medical history of breast cancer, depression, GERD, hypothyroidism, hypertension, hyperlipidemia presented to hospital with progressive dysphagia mostly for the last 2 to 3 months.  Patient states that she feels that solid and liquid food gets stuck in her throat which is accompanied by sensation of bandlike feeling over the diaphragm and difficulty breathing.  He does have a history of GERD and feels bloated but denies any heartburn.  Patient denies any nausea, vomiting or obvious abdominal pain.  Has been having bowel movements.  Patient does have history of heart failure and has mild shortness of breath.  She admits to leg swelling recently with around 3 to 4 pounds weight gain.  Denies any chest pain, dizziness, lightheadedness or palpitations.  Denies any urinary urgency, frequency or dysuria.  Complains of mild cough.  ED Course: In the ED, patient stable vitals.  Chest x-ray showed cardiomegaly with some effusion.  EKG showed V paced rhythm.  Vitals were stable.  Due to progressive dysphagia, GI was consulted who recommended endoscopy.  Patient was then considered for observation in the hospital.  Review of Systems:  All systems were reviewed and were negative unless otherwise mentioned in the HPI  Past Medical History:  Diagnosis Date  . Arthritis   . Breast cancer ([place]) 2013   left breast, radiation and lumpectomy  . Cataract   . Depression   . Fatigue   . GERD (gastroesophageal reflux disease)   . Glaucoma   . Heart murmur   . Heart palpitations   . Hiatal hernia   . HOH (hard of hearing)    AIDS  . Hyperlipidemia   . Hypertension   . Hypothyroidism   . Left ankle injury    Old left ankle  injury  . Left bundle branch block   . Neuropathy   . Osteoporosis   . Pacemaker   . Palpitations   . Personal history of radiation therapy 2013   left breast ca  . Scoliosis   . Shortness of breath dyspnea   . Thyroid nodule   . TIA (transient ischemic attack)    Past Surgical History:  Procedure Laterality Date  . ABDOMINAL HYSTERECTOMY    . BREAST BIOPSY Left 2005   b[place]in  . BREAST BIOPSY Left 2013   stereo bx, invasive tubular carcinoma and DCIS  . 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 BREAST SURGERY     LUMPECTOMY  . CATARACT EXTRACTION W/PHACO Right 03/01/2015   Procedure: CATARACT EXTRACTION PHACO AND INTRAOCULAR LENS PLACEMENT (IOC);  Surgeon: Birder Robson, MD;  Location: [facility] ORS;  Service: Ophthalmology;  Laterality: Right;  US:1:03.1 AP:22.8 CDE:14.37  Fluid lot # [id number]  . CATARACT EXTRACTION W/PHACO Left 03/22/2015   Procedure: CATARACT EXTRACTION PHACO AND INTRAOCULAR LENS PLACEMENT (IOC);  Surgeon: Birder Robson, MD;  Location: [facility] ORS;  Service: Ophthalmology;  Laterality: Left;  [place]: 01:01.0 AP%: 22.5% CDE: 13.75 Lot # [id number]  . EYE SURGERY  09/2020 and 10/2020  . MASTECTOMY PARTIAL / LUMPECTOMY    . THYROIDECTOMY, PARTIAL      Social History:  reports that she has never smoked. She has never used smokeless tobacco. She reports current drug use.  She reports that she does not drink alcohol.  Allergies  Allergen Reactions  . Codeine Other (See Comments)  . Etodolac Other (See Comments)  . Zolpidem Other (See Comments)    Family History  Problem Relation Age of Onset  . Breast cancer Neg Hx      Prior to Admission medications   Medication Sig Start Date End Date Taking? Authorizing Provider  acetaminophen (TYLENOL) 650 MG CR tablet Take 1,300 mg by mouth 2 (two) times daily.    [provider]  albuterol (VENTOLIN HFA) 108 (90 Base) MCG/ACT inhaler Inhale 2 puffs into  the lungs every 6 (six) hours as needed for wheezing or shortness of breath. 09/09/20   Beckie Salts, FNP  azithromycin (ZITHROMAX) 250 MG tablet Take 2 tablets on day 1, then 1 tablet daily on days 2 through 5 11/28/20 12/03/20  Cletis Athens, MD  Cholecalciferol 50 MCG (2000 UT) TABS Take 2,000 Units by mouth daily.     [provider]  docusate sodium (COLACE) 100 MG capsule Take 1-2 capsules (100-200 mg total) by mouth at bedtime as needed for moderate constipation. 11/03/20 05/02/21  Gillis Santa, MD  ELIQUIS 2.5 MG TABS tablet TAKE 1 TABLET BY MOUTH TWICE DAILY 11/30/20   Cletis Athens, MD  furosemide (LASIX) 20 MG tablet Take 1 tablet (20 mg total) by mouth daily for 7 days. 10/25/20 11/01/20  Lucrezia Starch, MD  furosemide (LASIX) 20 MG tablet Take 1 tablet (20 mg total) by mouth daily. 10/31/20   Beckie Salts, FNP  hydrochlorothiazide (HYDRODIURIL) 25 MG tablet TAKE 1 TABLET BY MOUTH DAILY 09/15/20   Beckie Salts, FNP  losartan (COZAAR) 100 MG tablet TAKE 1 TABLET BY MOUTH DAILY 10/27/20   Beckie Salts, FNP  methylPREDNISolone (MEDROL DOSEPAK) 4 MG TBPK tablet As directed 11/28/20   Cletis Athens, MD  metoprolol succinate (TOPROL-XL) 50 MG 24 hr tablet TAKE 1 TABLET BY MOUTH DAILY 07/11/20   Cletis Athens, MD  pregabalin (LYRICA) 50 MG capsule TAKE 1 CAPSULE 3 TIMES DAILY 10/27/20   Beckie Salts, FNP  traMADol (ULTRAM-ER) 100 MG 24 hr tablet Take 1 tablet (100 mg total) by mouth daily. 11/03/20   Gillis Santa, MD    Physical Exam: Vitals:   12/01/20 1456 12/01/20 1458 12/01/20 1826  BP: 139/70  138/71  Pulse: (!) 59  66  Resp: (!) 22  20  Temp: 98 F (36.7 C)  98.4 F (36.9 C)  TempSrc: Oral  Oral  SpO2: 99%  99%  Weight:  64 kg   Height:  5' (1.524 m)    Wt Readings from Last 3 Encounters:  12/01/20 64 kg  11/04/20 64 kg  11/03/20 63.5 kg   Body mass index is 27.54 kg/m.  General:  Average built, not in obvious distress, hard of hearing HENT: Normocephalic, pupils  equally reacting to light and accommodation.  No scleral pallor or icterus noted. Oral mucosa is moist.  Chest:   Diminished breath sounds bilaterally.  Chest wall with pacer CVS: S1 &S2 heard. No murmur.  Regular rate and rhythm. Abdomen: Soft, nontender, nondistended.  Bowel sounds are heard.  Liver is not palpable, no abdominal mass palpated Extremities: No cyanosis, clubbing but bilateral lower extremity pitting edema noted.  Peripheral pulses are palpable. Psych: Alert, awake and oriented, normal mood CNS:  No cranial nerve deficits.  Power equal in all extremities.    Skin: Warm and dry.  No rashes noted.  Labs on Admission:   CBC: Recent  Labs  Lab 11/28/20 1128 12/01/20 1508  WBC 7.8 8.0  NEUTROABS 6,513  --   HGB 11.4* 11.3*  HCT 36.1 35.1*  MCV 89.1 90.0  PLT 289 0000000    Basic Metabolic Panel: Recent Labs  Lab 11/28/20 1128 12/01/20 1508  NA 142 140  K 4.3 3.6  CL 103 102  CO2 27 26  GLUCOSE 80 155*  BUN 43* 48*  CREATININE 1.39* 1.37*  CALCIUM 9.6 8.8*  MG  --  1.7    Liver Function Tests: Recent Labs  Lab 11/28/20 1128 12/01/20 1508  AST 31 35  ALT 43* 41  ALKPHOS  --  92  BILITOT 0.7 0.8  PROT 5.9* 6.0*  ALBUMIN  --  3.7   No results for input(s): LIPASE, AMYLASE in the last 168 hours. No results for input(s): AMMONIA in the last 168 hours.  Cardiac Enzymes: No results for input(s): CKTOTAL, CKMB, CKMBINDEX, TROPONINI in the last 168 hours.  BNP (last 3 results) Recent Labs    10/25/20 1243 12/01/20 1508  BNP 681.4* 834.2*    ProBNP (last 3 results) No results for input(s): PROBNP in the last 8760 hours.  CBG: No results for input(s): GLUCAP in the last 168 hours.   Drugs of Abuse  No results found for: LABOPIA, COCAINSCRNUR, LABBENZ, AMPHETMU, THCU, LABBARB    Radiological Exams on Admission: DG Chest 2 View  Result Date: 12/01/2020 CLINICAL DATA:  Shortness of breath EXAM: CHEST - 2 VIEW COMPARISON:  10/25/2020 FINDINGS:  Left-sided pacing device as before. Cardiomegaly with small bilateral pleural effusions. Aortic atherosclerosis. No consolidation or pneumothorax. IMPRESSION: Cardiomegaly with small pleural effusions. Electronically Signed   By: Donavan Foil M.D.   On: 12/01/2020 16:14    EKG: Personally reviewed by me which shows ventricular paced rhythm  Assessment/Plan  Principal Problem:   Dysphagia Active Problems:   Pacemaker   Shortness of breath   CHF (congestive heart failure) (HCC)  Dysphagia seems to be progressive in nature both solids and liquids.  GI was consulted from the ED.  Dr. Vicente Males was notified.  Plan for endoscopic evaluation in a.m.  Keep the patient n.p.o. tonight.  History of GERD.  Possibility of stricture.  Shortness of breath combined with dysphagia.  Patient does have history of congestive heart failure, leg swelling.  Takes diuretics at home.  Continue with diuretics.  Daily weights, intake and output charting.  I do not see 2D echocardiogram recently.  We will check this time.  Elevated creatinine.  Hold HCTZ.  Patient is on losartan at home.  Will resume losartan and IV Lasix.  Continue to monitor closely.  History of atrial fibrillation.  Status post pacemaker.  Continue metoprolol and Eliquis  Hypertension.  Continue losartan and metoprolol.  History of peripheral neuropathy.  Continue Lyrica.   DVT Prophylaxis: Eliquis  Consultant: GI  Code Status: Full code as per the patient and family at bedside  Microbiology none  Antibiotics: None  Family Communication:  Patients' condition and plan of care including tests being ordered have been discussed with the patient and the patient's daughter at bedside who indicate understanding and agree with the plan.   Status is: Observation  The patient remains OBS appropriate and will d/c before 2 midnights.  Dispo: The patient is from: Home              Anticipated d/c is to: Home  Severity of Illness: The  appropriate patient status for this patient is OBSERVATION. Observation  status is judged to be reasonable and necessary in order to provide the required intensity of service to ensure the patient's safety. The patient's presenting symptoms, physical exam findings, and initial radiographic and laboratory data in the context of their medical condition is felt to place them at decreased risk for further clinical deterioration. Furthermore, it is anticipated that the patient will be medically stable for discharge from the hospital within 2 midnights of admission. The following factors support the patient status of observation.    Signed, Flora Lipps, MD [facility] 12/01/2020

## 2020-12-02 ENCOUNTER — Encounter: Payer: Self-pay | Admitting: Internal Medicine

## 2020-12-02 ENCOUNTER — Observation Stay
Admit: 2020-12-02 | Discharge: 2020-12-02 | Disposition: A | Discharge status: Home / Self Care | Payer: Medicare Other | Attending: Internal Medicine | Admitting: Internal Medicine

## 2020-12-02 DIAGNOSIS — B20 Human immunodeficiency virus [HIV] disease: Secondary | ICD-10-CM | POA: Diagnosis present

## 2020-12-02 DIAGNOSIS — I447 Left bundle-branch block, unspecified: Secondary | ICD-10-CM | POA: Diagnosis present

## 2020-12-02 DIAGNOSIS — I130 Hypertensive heart and chronic kidney disease with heart failure and stage 1 through stage 4 chronic kidney disease, or unspecified chronic kidney disease: Secondary | ICD-10-CM | POA: Diagnosis present

## 2020-12-02 DIAGNOSIS — N179 Acute kidney failure, unspecified: Secondary | ICD-10-CM | POA: Diagnosis present

## 2020-12-02 DIAGNOSIS — I5033 Acute on chronic diastolic (congestive) heart failure: Secondary | ICD-10-CM | POA: Diagnosis present

## 2020-12-02 DIAGNOSIS — M810 Age-related osteoporosis without current pathological fracture: Secondary | ICD-10-CM | POA: Diagnosis present

## 2020-12-02 DIAGNOSIS — M19011 Primary osteoarthritis, right shoulder: Secondary | ICD-10-CM | POA: Diagnosis present

## 2020-12-02 DIAGNOSIS — E890 Postprocedural hypothyroidism: Secondary | ICD-10-CM | POA: Diagnosis present

## 2020-12-02 DIAGNOSIS — I509 Heart failure, unspecified: Secondary | ICD-10-CM

## 2020-12-02 DIAGNOSIS — I495 Sick sinus syndrome: Secondary | ICD-10-CM | POA: Diagnosis present

## 2020-12-02 DIAGNOSIS — E785 Hyperlipidemia, unspecified: Secondary | ICD-10-CM | POA: Diagnosis present

## 2020-12-02 DIAGNOSIS — Z20822 Contact with and (suspected) exposure to covid-19: Secondary | ICD-10-CM | POA: Diagnosis present

## 2020-12-02 DIAGNOSIS — R1310 Dysphagia, unspecified: Secondary | ICD-10-CM

## 2020-12-02 DIAGNOSIS — R1319 Other dysphagia: Secondary | ICD-10-CM | POA: Diagnosis not present

## 2020-12-02 DIAGNOSIS — I480 Paroxysmal atrial fibrillation: Secondary | ICD-10-CM | POA: Diagnosis present

## 2020-12-02 DIAGNOSIS — M170 Bilateral primary osteoarthritis of knee: Secondary | ICD-10-CM | POA: Diagnosis present

## 2020-12-02 DIAGNOSIS — R0602 Shortness of breath: Secondary | ICD-10-CM | POA: Diagnosis present

## 2020-12-02 DIAGNOSIS — K219 Gastro-esophageal reflux disease without esophagitis: Secondary | ICD-10-CM | POA: Diagnosis present

## 2020-12-02 DIAGNOSIS — I340 Nonrheumatic mitral (valve) insufficiency: Secondary | ICD-10-CM | POA: Diagnosis present

## 2020-12-02 DIAGNOSIS — N1831 Chronic kidney disease, stage 3a: Secondary | ICD-10-CM | POA: Diagnosis present

## 2020-12-02 DIAGNOSIS — G894 Chronic pain syndrome: Secondary | ICD-10-CM | POA: Diagnosis present

## 2020-12-02 DIAGNOSIS — H409 Unspecified glaucoma: Secondary | ICD-10-CM | POA: Diagnosis present

## 2020-12-02 DIAGNOSIS — Z7901 Long term (current) use of anticoagulants: Secondary | ICD-10-CM | POA: Diagnosis not present

## 2020-12-02 DIAGNOSIS — G629 Polyneuropathy, unspecified: Secondary | ICD-10-CM | POA: Diagnosis present

## 2020-12-02 DIAGNOSIS — M419 Scoliosis, unspecified: Secondary | ICD-10-CM | POA: Diagnosis present

## 2020-12-02 DIAGNOSIS — H9190 Unspecified hearing loss, unspecified ear: Secondary | ICD-10-CM | POA: Diagnosis present

## 2020-12-02 DIAGNOSIS — K449 Diaphragmatic hernia without obstruction or gangrene: Secondary | ICD-10-CM | POA: Diagnosis present

## 2020-12-02 LAB — ECHOCARDIOGRAM COMPLETE
AR max vel: 3.03 cm2
AV Area VTI: 2.89 cm2
AV Area mean vel: 2.57 cm2
AV Mean grad: 3 mmHg
AV Peak grad: 4.4 mmHg
Ao pk vel: 1.05 m/s
Area-P 1/2: 3.7 cm2
Height: 60 in
S' Lateral: 2.14 cm
Weight: 2151.69 oz

## 2020-12-02 LAB — CBC
HCT: 36.2 % (ref 36.0–46.0)
Hemoglobin: 11.6 g/dL — ABNORMAL LOW (ref 12.0–15.0)
MCH: 29 pg (ref 26.0–34.0)
MCHC: 32 g/dL (ref 30.0–36.0)
MCV: 90.5 fL (ref 80.0–100.0)
Platelets: 244 10*3/uL (ref 150–400)
RBC: 4 MIL/uL (ref 3.87–5.11)
RDW: 16.6 % — ABNORMAL HIGH (ref 11.5–15.5)
WBC: 7.5 10*3/uL (ref 4.0–10.5)
nRBC: 0 % (ref 0.0–0.2)

## 2020-12-02 LAB — BASIC METABOLIC PANEL
Anion gap: 13 (ref 5–15)
BUN: 46 mg/dL — ABNORMAL HIGH (ref 8–23)
CO2: 28 mmol/L (ref 22–32)
Calcium: 9 mg/dL (ref 8.9–10.3)
Chloride: 102 mmol/L (ref 98–111)
Creatinine, Ser: 1.15 mg/dL — ABNORMAL HIGH (ref 0.44–1.00)
GFR, Estimated: 44 mL/min — ABNORMAL LOW (ref >60)
Glucose, Bld: 96 mg/dL (ref 70–99)
Potassium: 3.8 mmol/L (ref 3.5–5.1)
Sodium: 143 mmol/L (ref 135–145)

## 2020-12-02 LAB — MAGNESIUM: Magnesium: 1.8 mg/dL (ref 1.7–2.4)

## 2020-12-02 MED ORDER — PANTOPRAZOLE SODIUM 40 MG PO TBEC
40.0000 mg | DELAYED_RELEASE_TABLET | Freq: Two times a day (BID) | ORAL | Status: DC
  Administered 2020-12-03 (×2): 40 mg via ORAL
  Filled 2020-12-02 (×3): qty 1

## 2020-12-02 MED ORDER — TRAMADOL HCL 50 MG PO TABS
50.0000 mg | ORAL_TABLET | Freq: Two times a day (BID) | ORAL | Status: DC
Start: 2020-12-02 — End: 2020-12-04
  Administered 2020-12-02 – 2020-12-03 (×3): 50 mg via ORAL
  Filled 2020-12-02 (×4): qty 1

## 2020-12-02 MED ORDER — HYDRALAZINE HCL 20 MG/ML IJ SOLN
5.0000 mg | Freq: Four times a day (QID) | INTRAMUSCULAR | Status: DC | PRN
  Administered 2020-12-02: 5 mg via INTRAVENOUS
  Filled 2020-12-02: qty 1

## 2020-12-02 MED ORDER — METOPROLOL SUCCINATE ER 50 MG PO TB24
50.0000 mg | ORAL_TABLET | Freq: Every day | ORAL | Status: DC
  Administered 2020-12-02 – 2020-12-04 (×3): 50 mg via ORAL
  Filled 2020-12-02 (×5): qty 1

## 2020-12-02 NOTE — Consult Note (Signed)
Sue Darby, MD [address]  [place]  [place], [place] [address]  Main: [phone]  Fax: [phone] Pager: [phone]   Consultation  Referring Provider:     No ref. provider found Primary Care Physician:  Cletis Athens, MD Primary Gastroenterologist: Althia Forts         Reason for Consultation:     Difficulty swallowing  Date of Admission:  12/01/2020 Date of Consultation:  12/02/2020         HPI:   Sue Knapp is a 87 y.o. female history of A. fib, pacemaker on Eliquis, preserved cardiac function is admitted with difficulty swallowing and swelling of legs.  Patient has elevated BNP, being treated for mild CHF as well.  She is kept n.p.o.  Her last dose of Eliquis was 5/12 PM as per patient's daughter.  Patient reports that she feels like something is blocking her esophagus whenever she tries to eat or drink but eventually the food goes down.  She denies any episodes of regurgitation or heartburn.  She does not take PPI as outpatient.  Patient lives at assisted living facility at [facility].  She denies any abdominal pain, nausea or vomiting.   NSAIDs: None  Antiplts/Anticoagulants/Anti thrombotics: Eliquis for history of A. fib  GI Procedures: None  Past Medical History:  Diagnosis Date  . Arthritis   . Breast cancer ([place]) 2013   left breast, radiation and lumpectomy  . Cataract   . Fatigue   . GERD (gastroesophageal reflux disease)   . Glaucoma   . Heart murmur   . Heart palpitations   . Hiatal hernia   . HOH (hard of hearing)    AIDS  . Hyperlipidemia   . Hypertension   . Hypothyroidism   . Left ankle injury    Old left ankle injury  . Left bundle branch block   . Neuropathy   . Osteoporosis   . Pacemaker   . Palpitations   . Personal history of radiation therapy 2013   left breast ca  . Scoliosis   . Shortness of breath dyspnea   . Thyroid nodule   . TIA (transient ischemic attack)     Past Surgical History:  Procedure  Laterality Date  . ABDOMINAL HYSTERECTOMY    . BREAST BIOPSY Left 2005   bengin  . BREAST BIOPSY Left 2013   stereo bx, invasive tubular carcinoma and DCIS  . 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 BREAST SURGERY     LUMPECTOMY  . CATARACT EXTRACTION W/PHACO Right 03/01/2015   Procedure: CATARACT EXTRACTION PHACO AND INTRAOCULAR LENS PLACEMENT (IOC);  Surgeon: Birder Robson, MD;  Location: [facility] ORS;  Service: Ophthalmology;  Laterality: Right;  US:1:03.1 AP:22.8 CDE:14.37  Fluid lot # [id number]  . CATARACT EXTRACTION W/PHACO Left 03/22/2015   Procedure: CATARACT EXTRACTION PHACO AND INTRAOCULAR LENS PLACEMENT (IOC);  Surgeon: Birder Robson, MD;  Location: [facility] ORS;  Service: Ophthalmology;  Laterality: Left;  [place]: 01:01.0 AP%: 22.5% CDE: 13.75 Lot # [id number]  . EYE SURGERY  09/2020 and 10/2020  . MASTECTOMY PARTIAL / LUMPECTOMY    . THYROIDECTOMY, PARTIAL      Prior to Admission medications   Medication Sig Start Date End Date Taking? Authorizing Provider  ALPRAZolam Duanne Moron) 0.25 MG tablet Take 1 tablet by mouth daily as needed. 09/15/19  Yes [provider]  azithromycin (ZITHROMAX) 250 MG tablet Take 2 tablets on day 1, then  1 tablet daily on days 2 through 5 11/28/20 12/03/20 Yes Masoud, Viann Shove, MD  docusate sodium (COLACE) 100 MG capsule Take 1-2 capsules (100-200 mg total) by mouth at bedtime as needed for moderate constipation. 11/03/20 05/02/21 Yes Lateef, Carlus Pavlov, MD  ELIQUIS 2.5 MG TABS tablet TAKE 1 TABLET BY MOUTH TWICE DAILY 11/30/20  Yes Masoud, Viann Shove, MD  furosemide (LASIX) 20 MG tablet Take 1 tablet (20 mg total) by mouth daily. 10/31/20  Yes Beckie Salts, FNP  losartan (COZAAR) 100 MG tablet TAKE 1 TABLET BY MOUTH DAILY 10/27/20  Yes Beckie Salts, FNP  methylPREDNISolone (MEDROL DOSEPAK) 4 MG TBPK tablet As directed 11/28/20  Yes Masoud, Viann Shove, MD  metoprolol succinate (TOPROL-XL) 50 MG 24 hr tablet TAKE 1  TABLET BY MOUTH DAILY 07/11/20  Yes Masoud, Viann Shove, MD  pregabalin (LYRICA) 50 MG capsule TAKE 1 CAPSULE 3 TIMES DAILY Patient taking differently: Take 50 mg by mouth 3 (three) times daily. 10/27/20  Yes Beckie Salts, FNP  acetaminophen (TYLENOL) 650 MG CR tablet Take 1,300 mg by mouth 2 (two) times daily.    [provider]  albuterol (VENTOLIN HFA) 108 (90 Base) MCG/ACT inhaler Inhale 2 puffs into the lungs every 6 (six) hours as needed for wheezing or shortness of breath. Patient not taking: No sig reported 09/09/20   Beckie Salts, FNP  Cholecalciferol 50 MCG (2000 UT) TABS Take 2,000 Units by mouth daily.  Patient not taking: Reported on 12/01/2020    [provider]  furosemide (LASIX) 20 MG tablet Take 1 tablet (20 mg total) by mouth daily for 7 days. 10/25/20 11/01/20  Lucrezia Starch, MD  hydrochlorothiazide (HYDRODIURIL) 25 MG tablet TAKE 1 TABLET BY MOUTH DAILY Patient not taking: No sig reported 09/15/20   Beckie Salts, FNP  traMADol (ULTRAM-ER) 100 MG 24 hr tablet Take 1 tablet (100 mg total) by mouth daily. 11/03/20   Gillis Santa, MD    Current Facility-Administered Medications:  .  acetaminophen (TYLENOL) tablet 1,000 mg, 1,000 mg, Oral, BID, Pokhrel, Laxman, MD, 1,000 mg at 12/02/20 1149 .  albuterol (VENTOLIN HFA) 108 (90 Base) MCG/ACT inhaler 2 puff, 2 puff, Inhalation, Q6H PRN, Pokhrel, Laxman, MD .  cholecalciferol (VITAMIN D3) tablet 2,000 Units, 2,000 Units, Oral, Daily, Pokhrel, Laxman, MD, 2,000 Units at 12/02/20 1150 .  docusate sodium (COLACE) capsule 100-200 mg, 100-200 mg, Oral, QHS PRN, Pokhrel, Laxman, MD .  furosemide (LASIX) injection 20 mg, 20 mg, Intravenous, Daily, Pokhrel, Laxman, MD, 20 mg at 12/02/20 1148 .  hydrALAZINE (APRESOLINE) injection 5 mg, 5 mg, Intravenous, Q6H PRN, Irene Pap N, DO, 5 mg at 12/02/20 0450 .  losartan (COZAAR) tablet 100 mg, 100 mg, Oral, Daily, Pokhrel, Laxman, MD, 100 mg at 12/02/20 1150 .  metoprolol succinate  (TOPROL-XL) 24 hr tablet 50 mg, 50 mg, Oral, Daily, Hall, Carole N, DO, 50 mg at 12/02/20 0120 .  ondansetron (ZOFRAN) tablet 4 mg, 4 mg, Oral, Q6H PRN, 4 mg at 12/02/20 0525 **OR** ondansetron (ZOFRAN) injection 4 mg, 4 mg, Intravenous, Q6H PRN, Pokhrel, Laxman, MD .  Derrill Memo ON 12/03/2020] pantoprazole (PROTONIX) EC tablet 40 mg, 40 mg, Oral, BID AC, Eldo Umanzor, Tally Due, MD .  pregabalin (LYRICA) capsule 50 mg, 50 mg, Oral, TID, Pokhrel, Laxman, MD, 50 mg at 12/02/20 1505 .  senna-docusate (Senokot-S) tablet 1 tablet, 1 tablet, Oral, QHS PRN, Pokhrel, Laxman, MD, 1 tablet at 12/01/20 2309 .  traMADol (ULTRAM) tablet 50 mg, 50 mg, Oral, Q12H, Pokhrel, Laxman, MD, 50 mg at 12/02/20  76  Family History  Problem Relation Age of Onset  . Breast cancer Neg Hx      Social History   Tobacco Use  . Smoking status: Never Smoker  . Smokeless tobacco: Never Used  Substance Use Topics  . Alcohol use: No  . Drug use: Yes    Comment: pain clinic: tramadol    Allergies as of 12/01/2020 - Review Complete 12/01/2020  Allergen Reaction Noted  . Codeine Other (See Comments) 01/25/2015    Review of Systems:    All systems reviewed and negative except where noted in HPI.   Physical Exam:  Vital signs in last 24 hours: Temp:  [97.4 F (36.3 C)-98.5 F (36.9 C)] 97.4 F (36.3 C) (05/13 1521) Pulse Rate:  [59-67] 59 (05/13 1521) Resp:  [16-22] 17 (05/13 1521) BP: (124-183)/(53-90) 124/53 (05/13 1521) SpO2:  [91 %-99 %] 93 % (05/13 1521) Weight:  [61 kg] 61 kg (05/13 0001)   General:   Pleasant, cooperative in NAD Head:  Normocephalic and atraumatic. Eyes:   No icterus.   Conjunctiva pink. PERRLA. Ears: Decreased auditory acuity, wears hearing aid. Neck:  Supple; no masses or thyroidomegaly Lungs: Respirations even and unlabored. Lungs clear to auscultation bilaterally.   No wheezes, crackles, or rhonchi.  Heart:  Regular rate and rhythm;  Without murmur, clicks, rubs or gallops Abdomen:   Soft, nondistended, nontender. Normal bowel sounds. No appreciable masses or hepatomegaly.  No rebound or guarding.  Rectal:  Not performed. Msk:  Symmetrical without gross deformities.  Strength normal Extremities:  1+ edema, cyanosis or clubbing. Neurologic:  Alert and oriented x3;  grossly normal neurologically. Skin:  Intact without significant lesions or rashes. Psych:  Alert and cooperative. Normal affect.  LAB RESULTS: CBC Latest Ref Rng & Units 12/02/2020 12/01/2020 11/28/2020  WBC 4.0 - 10.5 K/uL 7.5 8.0 7.8  Hemoglobin 12.0 - 15.0 g/dL 11.6(L) 11.3(L) 11.4(L)  Hematocrit 36.0 - 46.0 % 36.2 35.1(L) 36.1  Platelets 150 - 400 K/uL 244 249 289    BMET BMP Latest Ref Rng & Units 12/02/2020 12/01/2020 11/28/2020  Glucose 70 - 99 mg/dL 96 155(H) 80  BUN 8 - 23 mg/dL 46(H) 48(H) 43(H)  Creatinine 0.44 - 1.00 mg/dL 1.15(H) 1.37(H) 1.39(H)  BUN/Creat Ratio 6 - 22 (calc) - - 31(H)  Sodium 135 - 145 mmol/L 143 140 142  Potassium 3.5 - 5.1 mmol/L 3.8 3.6 4.3  Chloride 98 - 111 mmol/L 102 102 103  CO2 22 - 32 mmol/L $RemoveB'28 26 27  'hQHBvLzM$ Calcium 8.9 - 10.3 mg/dL 9.0 8.8(L) 9.6    LFT Hepatic Function Latest Ref Rng & Units 12/01/2020 11/28/2020 10/25/2020  Total Protein 6.5 - 8.1 g/dL 6.0(L) 5.9(L) 6.5  Albumin 3.5 - 5.0 g/dL 3.7 - 3.9  AST 15 - 41 U/L 35 31 50(H)  ALT 0 - 44 U/L 41 43(H) 68(H)  Alk Phosphatase 38 - 126 U/L 92 - 84  Total Bilirubin 0.3 - 1.2 mg/dL 0.8 0.7 1.0     STUDIES: DG Chest 2 View  Result Date: 12/01/2020 CLINICAL DATA:  Shortness of breath EXAM: CHEST - 2 VIEW COMPARISON:  10/25/2020 FINDINGS: Left-sided pacing device as before. Cardiomegaly with small bilateral pleural effusions. Aortic atherosclerosis. No consolidation or pneumothorax. IMPRESSION: Cardiomegaly with small pleural effusions. Electronically Signed   By: Donavan Foil M.D.   On: 12/01/2020 16:14   ECHOCARDIOGRAM COMPLETE  Result Date: 12/02/2020    ECHOCARDIOGRAM REPORT   Patient Name:   BRITTANEE GHAZARIAN  Date of  Exam: 12/02/2020 Medical Rec #:  [id number]         Height:       60.0 in Accession #:    [id number]        Weight:       134.5 lb Date of Birth:  [birth year]         BSA:          1.577 m Patient Age:    89 years          BP:           170/89 mmHg Patient Gender: F                 HR:           59 bpm. Exam Location:  [facility] Procedure: Cardiac Doppler and Color Doppler Indications:     CHF-acute diastolic S17.79  History:         Patient has no prior history of Echocardiogram examinations.                  TIA; Risk Factors:Hypertension. LBBB.  Sonographer:     Sherrie Sport RDCS (AE) Referring Phys:  [id number] [facility] POKHREL Diagnosing Phys: Serafina Royals MD IMPRESSIONS  1. Left ventricular ejection fraction, by estimation, is 55 to 60%. The left ventricle has normal function. The left ventricle has no regional wall motion abnormalities. Left ventricular diastolic parameters were normal.  2. Right ventricular systolic function is normal. The right ventricular size is normal.  3. Left atrial size was moderately dilated.  4. Right atrial size was mildly dilated.  5. The mitral valve is normal in structure. Moderate mitral valve regurgitation.  6. The aortic valve is normal in structure. Aortic valve regurgitation is mild. FINDINGS  Left Ventricle: Left ventricular ejection fraction, by estimation, is 55 to 60%. The left ventricle has normal function. The left ventricle has no regional wall motion abnormalities. The left ventricular internal cavity size was normal in size. There is  no left ventricular hypertrophy. Left ventricular diastolic parameters were normal. Right Ventricle: The right ventricular size is normal. No increase in right ventricular wall thickness. Right ventricular systolic function is normal. Left Atrium: Left atrial size was moderately dilated. Right Atrium: Right atrial size was mildly dilated. Pericardium: There is no evidence of pericardial effusion. Mitral Valve: The mitral valve is normal in  structure. Moderate mitral valve regurgitation. Tricuspid Valve: The tricuspid valve is normal in structure. Tricuspid valve regurgitation is mild. Aortic Valve: The aortic valve is normal in structure. Aortic valve regurgitation is mild. Aortic valve mean gradient measures 3.0 mmHg. Aortic valve peak gradient measures 4.4 mmHg. Aortic valve area, by VTI measures 2.89 cm. Pulmonic Valve: The pulmonic valve was normal in structure. Pulmonic valve regurgitation is not visualized. Aorta: The aortic root and ascending aorta are structurally normal, with no evidence of dilitation. IAS/Shunts: No atrial level shunt detected by color flow Doppler.  LEFT VENTRICLE PLAX 2D LVIDd:         3.36 cm LVIDs:         2.14 cm LV PW:         1.20 cm LV IVS:        0.94 cm LVOT diam:     2.10 cm LV SV:         67 LV SV Index:   42 LVOT Area:     3.46 cm  RIGHT VENTRICLE RV Basal diam:  2.60 cm RV S prime:  12.00 cm/s TAPSE (M-mode): 3.1 cm LEFT ATRIUM              Index        RIGHT ATRIUM           Index LA diam:        3.90 cm  2.47 cm/m   RA Area:     21.10 cm LA Vol (A2C):   172.0 ml 109.08 ml/m RA Volume:   56.10 ml  35.58 ml/m LA Vol (A4C):   78.9 ml  50.04 ml/m LA Biplane Vol: 118.0 ml 74.83 ml/m  AORTIC VALVE AV Area (Vmax):    3.03 cm AV Area (Vmean):   2.57 cm AV Area (VTI):     2.89 cm AV Vmax:           105.00 cm/s AV Vmean:          78.800 cm/s AV VTI:            0.231 m AV Peak Grad:      4.4 mmHg AV Mean Grad:      3.0 mmHg LVOT Vmax:         92.00 cm/s LVOT Vmean:        58.500 cm/s LVOT VTI:          0.193 m LVOT/AV VTI ratio: 0.84  AORTA Ao Root diam: 3.15 cm MITRAL VALVE               TRICUSPID VALVE MV Area (PHT): 3.70 cm    TR Peak grad:   43.8 mmHg MV Decel Time: 205 msec    TR Vmax:        331.00 cm/s MV E velocity: 97.30 cm/s                            SHUNTS                            Systemic VTI:  0.19 m                            Systemic Diam: 2.10 cm Serafina Royals MD Electronically signed  by Serafina Royals MD Signature Date/Time: 12/02/2020/3:10:29 PM    Final       Impression / Plan:   ITZA MANIACI is a 87 y.o. female with history of A. fib, pacemaker on Eliquis, diastolic heart failure who presented from assisted living facility due to difficulty swallowing  Dysphagia, mild as patient is still able to eat without episodes of food impaction or regurgitation EGD can be performed as inpatient after interruption of Eliquis at least for 48 hours or can be performed as an outpatient elective procedure. After discussing with patient's daughter, she prefers the upper endoscopy to be performed as inpatient due to her social situation Okay to restart full liquid diet Start Protonix 40 mg p.o. twice daily We will plan to perform EGD on 5/15, last dose of Eliquis on 5/12  Thank you for involving me in the care of this patient.  Patient's daughter expressed understanding of the plan    LOS: 0 days   Sherri Sear, MD  12/02/2020, 5:18 PM   Note: This dictation was prepared with Dragon dictation along with smaller phrase technology. Any transcriptional errors that result from this process are unintentional.

## 2020-12-02 NOTE — Evaluation (Signed)
Occupational Therapy Evaluation Patient Details Name: Sue Knapp MRN: [id number] DOB: 06-28-1923 Today's Date: 12/02/2020    History of Present Illness 87 y.o. female with medical history significant for paroxysmal atrial fibrillation on Eliquis, breast cancer s/p lumpectomy and radiation therapy in 2013, depression, GERD, hypothyroidism, hypertension, hyperlipidemia, chronic diastolic CHF, s/p permanent pacemaker, TIA, presented to the hospital with shortness of breath, difficulty swallowing, sensation of tightness around her diaphragm.   Clinical Impression   Pt was seen for OT evaluation this date. Prior to hospital admission, pt was living at [place] in an independent living cottage. There she was able to get around her home with a rollator and some noted difficulty with transfers 2/2 bilat knee arthritis pain and bilat shoulder arthritis pain. Pt reports having an aide come 2x/wk to assist with laundry and shower transfers. Pt reports being modified independent with med mgt, light meal prep for 2/3 meals a day (staff provide 3rd meal), and denies falls in past 39mo. Pt expresses frustration and anxiety over current situation and reports to OT that she is upset at being unable to have "surgery" because she was given "Eliquis". Pt fairly distraught over whether she would discharge today vs tmw vs stay until Tuesday for the surgery. OT provided emotional support and active listening. OT sent secure chat to MD/TOC/RN to address pt's concerns. Pt transfers with CGA using 2WW (has rollator int he room) and declined to have OT assist with donning her knee braces, although endorses wearing them consistently with mobility. CGA to get to the recliner. OT assisted pt with calling her daughter for additional support. Currently pt demonstrates impairments as described below (See OT problem list) which functionally limit her ability to perform ADL/self-care tasks. Pt would benefit from skilled  OT services while hospitalized to address noted impairments and functional limitations and ensure she does not functionally decline while here (see below for any additional details) in order to maximize safety and independence while minimizing falls risk and caregiver burden.     Follow Up Recommendations  No OT follow up    Equipment Recommendations  None recommended by OT    Recommendations for Other Services       Precautions / Restrictions Precautions Precautions: Fall Required Braces or Orthoses: Other Brace Other Brace: pt has knee braces/wraps to help with mobility - says she uses them a lot for severe bilat knee arthritis Restrictions Weight Bearing Restrictions: No      Mobility Bed Mobility Overal bed mobility: Needs Assistance Bed Mobility: Supine to Sit     Supine to sit: Supervision;HOB elevated          Transfers Overall transfer level: Needs assistance Equipment used: Rolling walker (2 wheeled) Transfers: Sit to/from Stand Sit to Stand: Min guard         General transfer comment: cues for hand placement    Balance Overall balance assessment: Needs assistance Sitting-balance support: No upper extremity supported;Feet supported Sitting balance-Leahy Scale: Good     Standing balance support: Bilateral upper extremity supported Standing balance-Leahy Scale: Fair                             ADL either performed or assessed with clinical judgement   ADL Overall ADL's : Needs assistance/impaired  General ADL Comments: Near baseline, PRN MIN A for LB ADL for safety, CGA for ADL transfers including toileting with 2WW     Vision Baseline Vision/History: Wears glasses Wears Glasses: At all times Patient Visual Report: No change from baseline       Perception     Praxis      Pertinent Vitals/Pain Pain Assessment: No/denies pain     Hand Dominance     Extremity/Trunk  Assessment Upper Extremity Assessment Upper Extremity Assessment: Overall WFL for tasks assessed;Generalized weakness (age appropriate strength, bilat shoulder ROM limitations 2/2 arthritis pain)   Lower Extremity Assessment Lower Extremity Assessment: Generalized weakness;Overall WFL for tasks assessed (braces for bilat knees 2/2 arthritis pain, grossly age appropriate strength)       Communication Communication Communication: HOH (has hearing aides)   Cognition Arousal/Alertness: Awake/alert Behavior During Therapy: Anxious Overall Cognitive Status: Within Functional Limits for tasks assessed                                 General Comments: Pt very anxious about whether she is discharging today vs tmw and the burden on her daughter, encouragement and active listening provided, at times difficult to redirect as she perseverates on frustrating situation (unable to get "surgery" 2/2 taking Eliquis and no one told her not to), RN/MD/TOC notified, OT assisted pt in calling her daughter for support   General Comments       Exercises Other Exercises Other Exercises: OT facilitated problem solving to support coping strategies for stress/anxiety over current situation   Shoulder Instructions      Home Living Family/patient expects to be discharged to:: Other (Comment)                                 Additional Comments: Pt reports living in an independent living cottage at [facility], has walk in shower, shower chair, grab bars in shower, elevated commode, rollator, lift recliner      Prior Functioning/Environment Level of Independence: Needs assistance  Gait / Transfers Assistance Needed: Pt reports able to use her rollator in her home with modified independence, some difficulty wiht STS transfers 2/2 bilat knee OA but does not need assist for it ADL's / Homemaking Assistance Needed: Pt reports mod indep with dressing, has aide 2x/wk for laundry,  shower transfers; able to perform light meal prep 2x/day and has 1 meal/day delivered to her door from ILF, mod indep with med mgt using weekly pill box; ILF provides transportation   Comments: denies falls        OT Problem List: Decreased strength;Impaired balance (sitting and/or standing)      OT Treatment/Interventions: Self-care/ADL training;Therapeutic activities;Therapeutic exercise;Patient/family education;Balance training    OT Goals(Current goals can be found in the care plan section) Acute Rehab OT Goals Patient Stated Goal: have surgery to see what's wrong with my swallowing OT Goal Formulation: With patient Time For Goal Achievement: 12/16/20 Potential to Achieve Goals: Good ADL Goals Pt Will Perform Lower Body Dressing: with modified independence;sit to/from stand Pt Will Transfer to Toilet: with supervision;ambulating (elevated commode, LRAD for amb) Pt Will Perform Toileting - Clothing Manipulation and hygiene: with modified independence Additional ADL Goal #1: Pt will utilize learned energy conservation/stress mgt strategies during ADL/mobility tasks 3/3 opportunities, requiring PRN VC to initiate.  OT Frequency: Min 1X/week   Barriers to D/C:  Co-evaluation              AM-PAC OT "6 Clicks" Daily Activity     Outcome Measure Help from another person eating meals?: None Help from another person taking care of personal grooming?: None Help from another person toileting, which includes using toliet, bedpan, or urinal?: A Little Help from another person bathing (including washing, rinsing, drying)?: A Little Help from another person to put on and taking off regular upper body clothing?: None Help from another person to put on and taking off regular lower body clothing?: A Little 6 Click Score: 21   End of Session Equipment Utilized During Treatment: Rolling walker;Gait belt Nurse Communication: Mobility status;Other (comment)  (anxious)  Activity Tolerance: Patient tolerated treatment well Patient left: in chair;with bed alarm set  OT Visit Diagnosis: Muscle weakness (generalized) (M62.81);Unsteadiness on feet (R26.81)                Time: 0175-1025 OT Time Calculation (min): 52 min Charges:  OT General Charges $OT Visit: 1 Visit OT Evaluation $OT Eval Low Complexity: 1 Low OT Treatments $Therapeutic Activity: 38-52 mins  Hanley Hays, MPH, MS, OTR/L ascom [phone] 12/02/20, 4:54 PM

## 2020-12-02 NOTE — TOC Initial Note (Signed)
Transition of Care [facility]) - Initial/Assessment Note    Patient Details  Name: Sue Knapp MRN: [id number] Date of Birth: September 09, 1922  Transition of Care [facility]) CM/SW Contact:    Candie Chroman, LCSW Phone Number: 12/02/2020, 4:29 PM  Clinical Narrative:  CSW called daughter per OT request. Daughter asking if patient has to discharge today or if she can wait until tomorrow. CSW explained that there are no discharge orders in at this time. Patient is from [place].               Expected Discharge Plan: Home/Self Care Barriers to Discharge: Continued Medical Work up   Patient Goals and CMS Choice        Expected Discharge Plan and Services Expected Discharge Plan: Home/Self Care       Living arrangements for the past 2 months: [place]                                      Prior Living Arrangements/Services Living arrangements for the past 2 months: [place] Lives with:: Self Patient language and need for interpreter reviewed:: Yes Do you feel safe going back to the place where you live?: Yes      Need for Family Participation in Patient Care: Yes (Comment) Care giver support system in place?: Yes (comment)   Criminal Activity/Legal Involvement Pertinent to Current Situation/Hospitalization: No - Comment as needed  Activities of Daily Living Home Assistive Devices/Equipment: Walker (specify type) ADL Screening (condition at time of admission) Patient's cognitive ability adequate to safely complete daily activities?: Yes Is the patient deaf or have difficulty hearing?: Yes Does the patient have difficulty seeing, even when wearing glasses/contacts?: No Does the patient have difficulty concentrating, remembering, or making decisions?: No Patient able to express need for assistance with ADLs?: Yes Does the patient have difficulty dressing or bathing?: No Independently performs ADLs?: Yes (appropriate for  developmental age) Does the patient have difficulty walking or climbing stairs?: Yes Weakness of Legs: Both Weakness of Arms/Hands: Both  Permission Sought/Granted Permission sought to share information with : Family Supports    Share Information with NAME: Jacolyn Reedy     Permission granted to share info w Relationship: Daughter  Permission granted to share info w Contact Information: [phone]  Emotional Assessment Appearance:: Appears stated age Attitude/Demeanor/Rapport: Unable to Assess Affect (typically observed): Unable to Assess Orientation: : Oriented to Self,Oriented to Place,Oriented to  Time,Oriented to Situation Alcohol / Substance Use: Not Applicable Psych Involvement: No (comment)  Admission diagnosis:  Dysphagia [R13.10] Dysphagia, unspecified type [R13.10] Chronic congestive heart failure, unspecified heart failure type [facility]) [I50.9] Patient Active Problem List   Diagnosis Date Noted  . Dysphagia 12/01/2020  . CHF (congestive heart failure) ([place]) 12/01/2020  . Shortness of breath 10/31/2020  . MP (metacarpophalangeal) joint sprain, initial encounter 04/04/2020  . Dermatitis 04/04/2020  . Rotator cuff arthropathy of left shoulder 03/03/2020  . Annual physical exam 02/10/2020  . Hx of breast cancer 12/11/2019  . Essential hypertension 12/10/2019  . Pacemaker 11/26/2019  . Sick sinus syndrome due to SA node dysfunction ([place]) 11/26/2019  . Primary osteoarthritis of right knee 03/03/2019  . Primary osteoarthritis of left knee 03/03/2019  . Bilateral primary osteoarthritis of knee 03/03/2019  . Osteoarthritis of right shoulder 03/03/2019  . Chronic pain syndrome 03/03/2019   PCP:  Cletis Athens, MD Pharmacy:   [facility]  [place], [place] - [address] [place] [address] Phone: [phone] Fax: [phone]  [facility] [place], [place]  [address] Phone: [phone] Fax: [phone]  [place], [place] - [address] [place] [address] Phone: [phone] Fax: [phone]     Social Determinants of Health (SDOH) Interventions    Readmission Risk Interventions No flowsheet data found.

## 2020-12-02 NOTE — Progress Notes (Signed)
*  PRELIMINARY RESULTS* Echocardiogram 2D Echocardiogram has been performed.  Sherrie Sport 12/02/2020, 9:06 AM

## 2020-12-02 NOTE — Progress Notes (Addendum)
Progress Note    Sue Knapp  OIN:[id number] DOB: 1923-01-01  DOA: 12/01/2020 PCP: Cletis Athens, MD      Brief Narrative:    Medical records reviewed and are as summarized below:  Sue Knapp is a 87 y.o. female with medical history significant for paroxysmal atrial fibrillation on Eliquis, breast cancer s/p lumpectomy and radiation therapy in 2013, depression, GERD, hypothyroidism, hypertension, hyperlipidemia, chronic diastolic CHF, s/p permanent pacemaker, TIA, presented to the hospital with shortness of breath, difficulty swallowing, sensation of tightness around her diaphragm.      Assessment/Plan:   Principal Problem:   Dysphagia Active Problems:   Pacemaker   Shortness of breath   CHF (congestive heart failure) (HCC)   Body mass index is 26.26 kg/m.     Dysphagia, history of GERD: Plan for EGD tomorrow.  Follow-up with gastroenterologist.  Acute on chronic diastolic CHF: BNP was 709.6.  Continue IV Lasix.  Monitor BMP.  Last 2D echo on record was in 2015 which showed normal EF, severe LVH and moderate mitral regurgitation.  Repeat 2D echo is pending.  Paroxysmal atrial fibrillation: Hold Eliquis because of plan for endoscopy.  CKD stage IIIa: Creatinine is stable.  She says she was recently prescribed a course of steroids and azithromycin by her cardiologist on Monday 11/2020.  At this point, there is no need to continue azithromycin and steroids.  Diet Order            Diet Heart Room service appropriate? Yes; Fluid consistency: Thin  Diet effective now                    Consultants:  Gastroenterologist  Procedures:  None    Medications:   . acetaminophen  1,000 mg Oral BID  . apixaban  2.5 mg Oral BID  . cholecalciferol  2,000 Units Oral Daily  . furosemide  20 mg Intravenous Daily  . losartan  100 mg Oral Daily  . metoprolol succinate  50 mg Oral Daily  . pregabalin  50 mg Oral TID  . traMADol  100 mg Oral Daily    Continuous Infusions:   Anti-infectives (From admission, onward)   None             Family Communication/Anticipated D/C date and plan/Code Status   DVT prophylaxis: apixaban (ELIQUIS) tablet 2.5 mg Start: 12/01/20 2300 apixaban (ELIQUIS) tablet 2.5 mg     Code Status: Full Code  Family Communication: Arbie Cookey, daughter, at the bedside Disposition Plan:    Status is: Inpatient  Remains inpatient appropriate because:Ongoing diagnostic testing needed not appropriate for outpatient work up and Inpatient level of care appropriate due to severity of illness   Dispo: The patient is from: Home              Anticipated d/c is to: Home              Patient currently is not medically stable to d/c.   Difficult to place patient No              Subjective:   C/o difficulty swallowing solid food and tightness around the "diaphragm". No weight loss, hematemesis.  Breathing is a little better today.  Her daughter was at the bedside.  Objective:    Vitals:   12/02/20 0406 12/02/20 0426 12/02/20 0817 12/02/20 1103  BP: (!) 183/90 (!) 170/89 124/60 (!) 142/61  Pulse: (!) 59 (!) 59 (!) 59 (!) 59  Resp: 18  16 20  Temp: 97.8 F (36.6 C)  97.7 F (36.5 C) 97.8 F (36.6 C)  TempSrc: Oral  Oral Oral  SpO2: 91%  92% 93%  Weight:      Height:       No data found.   Intake/Output Summary (Last 24 hours) at 12/02/2020 1129 Last data filed at 12/02/2020 0548 Gross per 24 hour  Intake 0 ml  Output 800 ml  Net -800 ml   Filed Weights   12/01/20 1458 12/02/20 0001  Weight: 64 kg 61 kg    Exam:  GEN: NAD SKIN: Warm and dry EYES: EOMI ENT: MMM CV: RRR PULM: CTA B ABD: soft, obese, midline upper abdominal hernia,, NT, +BS CNS: AAO x 3, non focal EXT: No edema or tenderness        Data Reviewed:   I have personally reviewed following labs and imaging studies:  Labs: Labs show the following:   Basic Metabolic Panel: Recent Labs  Lab  11/28/20 1128 12/01/20 1508 12/02/20 0408  NA 142 140 143  K 4.3 3.6 3.8  CL 103 102 102  CO2 27 26 28   GLUCOSE 80 155* 96  BUN 43* 48* 46*  CREATININE 1.39* 1.37* 1.15*  CALCIUM 9.6 8.8* 9.0  MG  --  1.7 1.8   GFR Estimated Creatinine Clearance: 23.9 mL/min (A) (by C-G formula based on SCr of 1.15 mg/dL (H)). Liver Function Tests: Recent Labs  Lab 11/28/20 1128 12/01/20 1508  AST 31 35  ALT 43* 41  ALKPHOS  --  92  BILITOT 0.7 0.8  PROT 5.9* 6.0*  ALBUMIN  --  3.7   No results for input(s): LIPASE, AMYLASE in the last 168 hours. No results for input(s): AMMONIA in the last 168 hours. Coagulation profile No results for input(s): INR, PROTIME in the last 168 hours.  CBC: Recent Labs  Lab 11/28/20 1128 12/01/20 1508 12/02/20 0408  WBC 7.8 8.0 7.5  NEUTROABS 6,513  --   --   HGB 11.4* 11.3* 11.6*  HCT 36.1 35.1* 36.2  MCV 89.1 90.0 90.5  PLT 289 249 244   Cardiac Enzymes: No results for input(s): CKTOTAL, CKMB, CKMBINDEX, TROPONINI in the last 168 hours. BNP (last 3 results) No results for input(s): PROBNP in the last 8760 hours. CBG: No results for input(s): GLUCAP in the last 168 hours. D-Dimer: No results for input(s): DDIMER in the last 72 hours. Hgb A1c: No results for input(s): HGBA1C in the last 72 hours. Lipid Profile: No results for input(s): CHOL, HDL, LDLCALC, TRIG, CHOLHDL, LDLDIRECT in the last 72 hours. Thyroid function studies: No results for input(s): TSH, T4TOTAL, T3FREE, THYROIDAB in the last 72 hours.  Invalid input(s): FREET3 Anemia work up: No results for input(s): VITAMINB12, FOLATE, FERRITIN, TIBC, IRON, RETICCTPCT in the last 72 hours. Sepsis Labs: Recent Labs  Lab 11/28/20 1128 12/01/20 1508 12/02/20 0408  WBC 7.8 8.0 7.5    Microbiology Recent Results (from the past 240 hour(s))  Resp Panel by RT-PCR (Flu A&B, Covid) Nasopharyngeal Swab     Status: None   Collection Time: 12/01/20  8:00 PM   Specimen: Nasopharyngeal  Swab; Nasopharyngeal(NP) swabs in vial transport medium  Result Value Ref Range Status   SARS Coronavirus 2 by RT PCR NEGATIVE NEGATIVE Final    Comment: (NOTE) SARS-CoV-2 target nucleic acids are NOT DETECTED.  The SARS-CoV-2 RNA is generally detectable in upper respiratory specimens during the acute phase of infection. The lowest concentration of SARS-CoV-2 viral copies  this assay can detect is 138 copies/mL. A negative result does not preclude SARS-Cov-2 infection and should not be used as the sole basis for treatment or other patient management decisions. A negative result may occur with  improper specimen collection/handling, submission of specimen other than nasopharyngeal swab, presence of viral mutation(s) within the areas targeted by this assay, and inadequate number of viral copies(<138 copies/mL). A negative result must be combined with clinical observations, patient history, and epidemiological information. The expected result is Negative.  Fact Sheet for Patients:  EntrepreneurPulse.com.au  Fact Sheet for Healthcare Providers:  IncredibleEmployment.be  This test is no t yet approved or cleared by the [place] FDA and  has been authorized for detection and/or diagnosis of SARS-CoV-2 by FDA under an Emergency Use Authorization (EUA). This EUA will remain  in effect (meaning this test can be used) for the duration of the COVID-19 declaration under Section 564(b)(1) of the Act, 21 U.S.C.section 360bbb-3(b)(1), unless the authorization is terminated  or revoked sooner.       Influenza A by PCR NEGATIVE NEGATIVE Final   Influenza B by PCR NEGATIVE NEGATIVE Final    Comment: (NOTE) The Xpert Xpress SARS-CoV-2/FLU/RSV plus assay is intended as an aid in the diagnosis of influenza from Nasopharyngeal swab specimens and should not be used as a sole basis for treatment. Nasal washings and aspirates are unacceptable for Xpert Xpress  SARS-CoV-2/FLU/RSV testing.  Fact Sheet for Patients: EntrepreneurPulse.com.au  Fact Sheet for Healthcare Providers: IncredibleEmployment.be  This test is not yet approved or cleared by the [place] FDA and has been authorized for detection and/or diagnosis of SARS-CoV-2 by FDA under an Emergency Use Authorization (EUA). This EUA will remain in effect (meaning this test can be used) for the duration of the COVID-19 declaration under Section 564(b)(1) of the Act, 21 U.S.C. section 360bbb-3(b)(1), unless the authorization is terminated or revoked.  Performed at [facility], [address] [place], [place] [address]     Procedures and diagnostic studies:  DG Chest 2 View  Result Date: 12/01/2020 CLINICAL DATA:  Shortness of breath EXAM: CHEST - 2 VIEW COMPARISON:  10/25/2020 FINDINGS: Left-sided pacing device as before. Cardiomegaly with small bilateral pleural effusions. Aortic atherosclerosis. No consolidation or pneumothorax. IMPRESSION: Cardiomegaly with small pleural effusions. Electronically Signed   By: Donavan Foil M.D.   On: 12/01/2020 16:14               LOS: 0 days   Zelina Jimerson  [facility]   Pager on [email or web address]. If 7PM-7AM, please contact night-coverage at [email or web address]     12/02/2020, 11:29 AM

## 2020-12-02 NOTE — Consult Note (Signed)
   Heart Failure Nurse Navigator Note  Ejection fraction of the left ventricle in 2015 was greater than 55%.  Cardiogram on this admission is currently pending.  Presented to the emergency room with complaints of difficulty swallowing.  She also compaines of mild shortness of breath and leg swelling with a 3 to 4 pound weight gain.  Chest  x-ray showed cardiomegaly with some effusion.  Comorbidities:  Depression Hypertension Hyperlipidemia Arthritis Atrial fibrillation She has permanent pacemaker.  Medications:  Furosemide 20 mg IV daily Losartan 100 mg daily Metoprolol succinate 50 mg daily  Eliquis 2.5 mg currently on hold.  Labs:  Sodium 143, potassium 3.8, chloride 102, CO2 28, BUN 46, creatinine 1.15 down from 1.37 of yesterday.  BNP was 832. Weight was 61 kg yesterday 64 kg Intake not documented Output 800 mL   Initially met with patient's daughter Arbie Cookey patient was sleeping and did not arouse why we were talking.  Discussed heart failure and how to take care of herself at home, she does have a scale and a blood pressure cuff.  One of her meals daily is supplied by the care facility.  Went back when patient was awake and her procedure had been canceled due to her being on Eliquis.  She was awake and alert sitting up in the bed eating lunch.  She does become short of breath with conversation.  She states she does not add food to her salt and tries to low sodium.  Here since she had COVID in January she has been drinking Gatorade, advised against using that sport drink and trying something with less sodium.  They voiced understanding.  Discussed looking at the heart failure video on the iPad, they were not interested in doing it today but might want to look at it tomorrow.  Made the patient's nurse aware of the iPad on 2A.  Also discussed the heart failure outpatient clinic along with the upcoming appointment, she was given information about the clinic.  She was also  given the living with heart failure teaching booklet along with low-sodium handout.  Discussed the different zones on the magnet and what to report.  Pricilla Riffle RN CHFN

## 2020-12-03 DIAGNOSIS — I5033 Acute on chronic diastolic (congestive) heart failure: Secondary | ICD-10-CM

## 2020-12-03 LAB — BASIC METABOLIC PANEL
Anion gap: 9 (ref 5–15)
BUN: 51 mg/dL — ABNORMAL HIGH (ref 8–23)
CO2: 31 mmol/L (ref 22–32)
Calcium: 8.8 mg/dL — ABNORMAL LOW (ref 8.9–10.3)
Chloride: 100 mmol/L (ref 98–111)
Creatinine, Ser: 1.51 mg/dL — ABNORMAL HIGH (ref 0.44–1.00)
GFR, Estimated: 32 mL/min — ABNORMAL LOW (ref >60)
Glucose, Bld: 102 mg/dL — ABNORMAL HIGH (ref 70–99)
Potassium: 4.2 mmol/L (ref 3.5–5.1)
Sodium: 140 mmol/L (ref 135–145)

## 2020-12-03 LAB — MAGNESIUM: Magnesium: 1.9 mg/dL (ref 1.7–2.4)

## 2020-12-03 MED ORDER — SODIUM CHLORIDE 0.9 % IV SOLN: INTRAVENOUS | Status: DC

## 2020-12-03 NOTE — Progress Notes (Signed)
Pt. was awaken with SOB. O2 saturation was 89 % therefore O2 [place] was applied for comfort.

## 2020-12-03 NOTE — Evaluation (Signed)
Physical Therapy Evaluation Patient Details Name: Sue Knapp MRN: [id number] DOB: [birth year] Today's Date: 12/03/2020   History of Present Illness  87 y.o. female with medical history significant for paroxysmal atrial fibrillation on Eliquis, breast cancer s/p lumpectomy and radiation therapy in 2013, depression, GERD, hypothyroidism, hypertension, hyperlipidemia, chronic diastolic CHF, s/p permanent pacemaker, TIA, presented to the hospital with shortness of breath, difficulty swallowing, sensation of tightness around her diaphragm.  Clinical Impression  Pt seen for PT evaluation with daughter present for session. Pt is able to complete bed mobility with mod I but requires significantly extra time to don B knee braces, with PT ultimately assisting for time management, while sitting EOB. Pt is able to complete transfers & ambulate 1 lap around nurses station with rollator with supervision with decreased gait speed. Upon return to room pt experiences significant LOB requiring mod assist to correct with pt reporting her R knee gave out & needed time for it to "unlock". Reviewed BLE strengthening exercises pt performs at home with PT educating her on regimen she should perform them & how to modify them as needed to increase challenge as able but pt with poor memory/recall & asking PT how often she should perform exercises after just educating her on them. Pt would benefit from HHPT f/u & will continue to see pt acutely to address strengthening & activity tolerance.     Follow Up Recommendations Home health PT;Supervision for mobility/OOB    Equipment Recommendations  None recommended by PT    Recommendations for Other Services       Precautions / Restrictions Precautions Precautions: Fall Required Braces or Orthoses: Other Brace Other Brace: pt wears personal B knee braces (2/2 severe arthritis) Restrictions Weight Bearing Restrictions: No      Mobility  Bed Mobility Overal bed  mobility: Modified Independent Bed Mobility: Supine to Sit     Supine to sit: HOB elevated;Modified independent (Device/Increase time)          Transfers Overall transfer level: Needs assistance Equipment used: Rolling walker (2 wheeled) Transfers: Sit to/from Stand Sit to Stand: Min guard            Ambulation/Gait Ambulation/Gait assistance: Supervision Gait Distance (Feet): 170 Feet Assistive device: Rolling walker (2 wheeled) Gait Pattern/deviations: Decreased step length - left;Decreased step length - right;Decreased dorsiflexion - right;Decreased dorsiflexion - left Gait velocity: decreased      Stairs            Wheelchair Mobility    Modified Rankin (Stroke Patients Only)       Balance Overall balance assessment: Needs assistance Sitting-balance support: No upper extremity supported;Feet supported Sitting balance-Leahy Scale: Good     Standing balance support: Bilateral upper extremity supported                                 Pertinent Vitals/Pain Pain Assessment: Faces Faces Pain Scale: Hurts little more Pain Location: B knees during mobility Pain Descriptors / Indicators: Aching Pain Intervention(s): Monitored during session;Limited activity within patient's tolerance (used B knee braces)    Home Living                   Additional Comments: Pt reports living in an independent living cottage at [employer], has walk in shower, shower chair, grab bars in shower, elevated commode, rollator, lift recliner    Prior Function Level of Independence: Needs assistance   Gait /  Transfers Assistance Needed: Pt reports able to use her rollator in her home with modified independence, some difficulty wiht STS transfers 2/2 bilat knee OA but does not need assist for it     Comments: 1 reports 1 fall a couple weeks ago where she hit her head but examined at hospital & no issues     Hand Dominance         Extremity/Trunk Assessment   Upper Extremity Assessment Upper Extremity Assessment: Generalized weakness    Lower Extremity Assessment Lower Extremity Assessment: Generalized weakness (uses personal B knee braces 2/2 severe arthritis)    Cervical / Trunk Assessment Cervical / Trunk Assessment: Kyphotic  Communication   Communication: HOH (B hearing aides)  Cognition Arousal/Alertness: Awake/alert Behavior During Therapy: WFL for tasks assessed/performed Overall Cognitive Status: Within Functional Limits for tasks assessed                                        General Comments General comments (skin integrity, edema, etc.): HR 73 bpm with sitting EOB, 60 bpm during gait    Exercises     Assessment/Plan    PT Assessment Patient needs continued PT services  PT Problem List Decreased strength;Decreased mobility;Decreased activity tolerance;Decreased balance;Pain       PT Treatment Interventions DME instruction;Therapeutic activities;Modalities;Gait training;Therapeutic exercise;Patient/family education;Balance training;Functional mobility training;Manual techniques;Stair training;Neuromuscular re-education    PT Goals (Current goals can be found in the Care Plan section)  Acute Rehab PT Goals Patient Stated Goal: get better PT Goal Formulation: With patient Time For Goal Achievement: 12/17/20 Potential to Achieve Goals: Fair    Frequency Min 2X/week   Barriers to discharge Decreased caregiver support lives alone    Co-evaluation               AM-PAC PT "6 Clicks" Mobility  Outcome Measure Help needed turning from your back to your side while in a flat bed without using bedrails?: None Help needed moving from lying on your back to sitting on the side of a flat bed without using bedrails?: A Little Help needed moving to and from a bed to a chair (including a wheelchair)?: A Little Help needed standing up from a chair using your arms (e.g.,  wheelchair or bedside chair)?: A Little Help needed to walk in hospital room?: A Little Help needed climbing 3-5 steps with a railing? : A Lot 6 Click Score: 18    End of Session Equipment Utilized During Treatment: Gait belt;Other (comment) (personal B knee braces) Activity Tolerance: Patient tolerated treatment well Patient left: in chair;with chair alarm set;with call bell/phone within reach;with family/visitor present Nurse Communication: Mobility status PT Visit Diagnosis: Muscle weakness (generalized) (M62.81);Unsteadiness on feet (R26.81)    Time: 1014-1040 PT Time Calculation (min) (ACUTE ONLY): 26 min   Charges:   PT Evaluation $PT Eval Low Complexity: 1 Low PT Treatments $Therapeutic Activity: 8-22 mins        Lavone Nian, PT, DPT 12/03/20, 12:03 PM   Waunita Schooner 12/03/2020, 11:59 AM

## 2020-12-03 NOTE — Progress Notes (Signed)
Mobility Specialist - Progress Note   12/03/20 1300  Mobility  Activity Refused mobility  Mobility performed by Mobility specialist    Pt sleeping in recliner upon arrival, family at bedside. Pt awakens briefly but quickly returns to sleep. Pt requests to stay in recliner at this time. Will attempt session at another date/time.    Kathee Delton Mobility Specialist 12/03/20, 1:23 PM

## 2020-12-03 NOTE — Plan of Care (Signed)

## 2020-12-03 NOTE — Progress Notes (Signed)
Progress Note    Sue Knapp  [id number] DOB: 06-07-1923  DOA: 12/01/2020 PCP: Cletis Athens, MD      Brief Narrative:    Medical records reviewed and are as summarized below:  Sue Knapp is a 87 y.o. female with medical history significant for paroxysmal atrial fibrillation on Eliquis, breast cancer s/p lumpectomy and radiation therapy in 2013, depression, GERD, hypothyroidism, hypertension, hyperlipidemia, chronic diastolic CHF, s/p permanent pacemaker, TIA, presented to the hospital with shortness of breath, difficulty swallowing, sensation of tightness around her diaphragm.      Assessment/Plan:   Principal Problem:   Dysphagia Active Problems:   Pacemaker   Shortness of breath   CHF (congestive heart failure) (HCC)   Body mass index is 26.26 kg/m.     Dysphagia, history of GERD: Plan for EGD tomorrow.  Follow-up with gastroenterologist.    Acute on chronic diastolic CHF: BNP was [id number].  Discontinue IV Lasix.  Monitor BMP.   2D echo on 12/02/2020 showed EF estimated at 55 to 60%, moderate mitral regurgitation. Previous 2D echo on record was in 2015 which showed normal EF, severe LVH and moderate mitral regurgitation.    Paroxysmal atrial fibrillation: Eliquis is on hold.  AKI on CKD stage IIIa: Monitor BMP.  Hold losartan.  History of hypertension: BP is soft.  Hold losartan.    Diet Order            Diet full liquid Room service appropriate? Yes; Fluid consistency: Thin  Diet effective now                    Consultants:  Gastroenterologist  Procedures:  None    Medications:   . acetaminophen  1,000 mg Oral BID  . cholecalciferol  2,000 Units Oral Daily  . losartan  100 mg Oral Daily  . metoprolol succinate  50 mg Oral Daily  . pantoprazole  40 mg Oral BID AC  . pregabalin  50 mg Oral TID  . traMADol  50 mg Oral Q12H   Continuous Infusions:   Anti-infectives (From admission, onward)   None              Family Communication/Anticipated D/C date and plan/Code Status   DVT prophylaxis:      Code Status: Full Code  Family Communication: Arbie Cookey, daughter, at the bedside Disposition Plan:    Status is: Inpatient  Remains inpatient appropriate because:Ongoing diagnostic testing needed not appropriate for outpatient work up and Inpatient level of care appropriate due to severity of illness   Dispo: The patient is from: Home              Anticipated d/c is to: Home              Patient currently is not medically stable to d/c.   Difficult to place patient No              Subjective:   Interval events noted.  No shortness of breath or chest pain.  She is tolerating full liquid diet.  Objective:    Vitals:   12/03/20 0445 12/03/20 0455 12/03/20 0828 12/03/20 1111  BP:   127/71 (!) 109/58  Pulse:   (!) 59 (!) 59  Resp:   18 18  Temp:   98.2 F (36.8 C) 97.7 F (36.5 C)  TempSrc:   Oral Oral  SpO2: (!) 89% 93% 96% 93%  Weight:      Height:  No data found.   Intake/Output Summary (Last 24 hours) at 12/03/2020 1146 Last data filed at 12/03/2020 1031 Gross per 24 hour  Intake 700 ml  Output 200 ml  Net 500 ml   Filed Weights   12/01/20 1458 12/02/20 0001  Weight: 64 kg 61 kg    Exam:  GEN: NAD SKIN: Warm and dry EYES: EOMI ENT: MMM, hearing impairment CV: RRR PULM: Bibasilar rales.  No wheezing ABD: soft, obese, midline upper abdominal hernia NT, +BS CNS: AAO x 3, non focal EXT: No edema or tenderness        Data Reviewed:   I have personally reviewed following labs and imaging studies:  Labs: Labs show the following:   Basic Metabolic Panel: Recent Labs  Lab 11/28/20 1128 12/01/20 1508 12/02/20 0408 12/03/20 0403  NA 142 140 143 140  K 4.3 3.6 3.8 4.2  CL 103 102 102 100  CO2 27 26 28 31   GLUCOSE 80 155* 96 102*  BUN 43* 48* 46* 51*  CREATININE 1.39* 1.37* 1.15* 1.51*  CALCIUM 9.6 8.8* 9.0 8.8*  MG  --   1.7 1.8 1.9   GFR Estimated Creatinine Clearance: 18.2 mL/min (A) (by C-G formula based on SCr of 1.51 mg/dL (H)). Liver Function Tests: Recent Labs  Lab 11/28/20 1128 12/01/20 1508  AST 31 35  ALT 43* 41  ALKPHOS  --  92  BILITOT 0.7 0.8  PROT 5.9* 6.0*  ALBUMIN  --  3.7   No results for input(s): LIPASE, AMYLASE in the last 168 hours. No results for input(s): AMMONIA in the last 168 hours. Coagulation profile No results for input(s): INR, PROTIME in the last 168 hours.  CBC: Recent Labs  Lab 11/28/20 1128 12/01/20 1508 12/02/20 0408  WBC 7.8 8.0 7.5  NEUTROABS 6,513  --   --   HGB 11.4* 11.3* 11.6*  HCT 36.1 35.1* 36.2  MCV 89.1 90.0 90.5  PLT 289 249 244   Cardiac Enzymes: No results for input(s): CKTOTAL, CKMB, CKMBINDEX, TROPONINI in the last 168 hours. BNP (last 3 results) No results for input(s): PROBNP in the last 8760 hours. CBG: No results for input(s): GLUCAP in the last 168 hours. D-Dimer: No results for input(s): DDIMER in the last 72 hours. Hgb A1c: No results for input(s): HGBA1C in the last 72 hours. Lipid Profile: No results for input(s): CHOL, HDL, LDLCALC, TRIG, CHOLHDL, LDLDIRECT in the last 72 hours. Thyroid function studies: No results for input(s): TSH, T4TOTAL, T3FREE, THYROIDAB in the last 72 hours.  Invalid input(s): FREET3 Anemia work up: No results for input(s): VITAMINB12, FOLATE, FERRITIN, TIBC, IRON, RETICCTPCT in the last 72 hours. Sepsis Labs: Recent Labs  Lab 11/28/20 1128 12/01/20 1508 12/02/20 0408  WBC 7.8 8.0 7.5    Microbiology Recent Results (from the past 240 hour(s))  Resp Panel by RT-PCR (Flu A&B, Covid) Nasopharyngeal Swab     Status: None   Collection Time: 12/01/20  8:00 PM   Specimen: Nasopharyngeal Swab; Nasopharyngeal(NP) swabs in vial transport medium  Result Value Ref Range Status   SARS Coronavirus 2 by RT PCR NEGATIVE NEGATIVE Final    Comment: (NOTE) SARS-CoV-2 target nucleic acids are NOT  DETECTED.  The SARS-CoV-2 RNA is generally detectable in upper respiratory specimens during the acute phase of infection. The lowest concentration of SARS-CoV-2 viral copies this assay can detect is 138 copies/mL. A negative result does not preclude SARS-Cov-2 infection and should not be used as the sole basis for treatment or  other patient management decisions. A negative result may occur with  improper specimen collection/handling, submission of specimen other than nasopharyngeal swab, presence of viral mutation(s) within the areas targeted by this assay, and inadequate number of viral copies(<138 copies/mL). A negative result must be combined with clinical observations, patient history, and epidemiological information. The expected result is Negative.  Fact Sheet for Patients:  EntrepreneurPulse.com.au  Fact Sheet for Healthcare Providers:  IncredibleEmployment.be  This test is no t yet approved or cleared by the [place] FDA and  has been authorized for detection and/or diagnosis of SARS-CoV-2 by FDA under an Emergency Use Authorization (EUA). This EUA will remain  in effect (meaning this test can be used) for the duration of the COVID-19 declaration under Section 564(b)(1) of the Act, 21 U.S.C.section 360bbb-3(b)(1), unless the authorization is terminated  or revoked sooner.       Influenza A by PCR NEGATIVE NEGATIVE Final   Influenza B by PCR NEGATIVE NEGATIVE Final    Comment: (NOTE) The Xpert Xpress SARS-CoV-2/FLU/RSV plus assay is intended as an aid in the diagnosis of influenza from Nasopharyngeal swab specimens and should not be used as a sole basis for treatment. Nasal washings and aspirates are unacceptable for Xpert Xpress SARS-CoV-2/FLU/RSV testing.  Fact Sheet for Patients: EntrepreneurPulse.com.au  Fact Sheet for Healthcare Providers: IncredibleEmployment.be  This test is not yet  approved or cleared by the [place] FDA and has been authorized for detection and/or diagnosis of SARS-CoV-2 by FDA under an Emergency Use Authorization (EUA). This EUA will remain in effect (meaning this test can be used) for the duration of the COVID-19 declaration under Section 564(b)(1) of the Act, 21 U.S.C. section 360bbb-3(b)(1), unless the authorization is terminated or revoked.  Performed at [facility], [address] [place], [place] [address]     Procedures and diagnostic studies:  DG Chest 2 View  Result Date: 12/01/2020 CLINICAL DATA:  Shortness of breath EXAM: CHEST - 2 VIEW COMPARISON:  10/25/2020 FINDINGS: Left-sided pacing device as before. Cardiomegaly with small bilateral pleural effusions. Aortic atherosclerosis. No consolidation or pneumothorax. IMPRESSION: Cardiomegaly with small pleural effusions. Electronically Signed   By: Donavan Foil M.D.   On: 12/01/2020 16:14   ECHOCARDIOGRAM COMPLETE  Result Date: 12/02/2020    ECHOCARDIOGRAM REPORT   Patient Name:   Sue Knapp Date of Exam: 12/02/2020 Medical Rec #:  [id number]         Height:       60.0 in Accession #:    [id number]        Weight:       134.5 lb Date of Birth:  [birth year]         BSA:          1.577 m Patient Age:    89 years          BP:           170/89 mmHg Patient Gender: F                 HR:           59 bpm. Exam Location:  [facility] Procedure: Cardiac Doppler and Color Doppler Indications:     CHF-acute diastolic P38.25  History:         Patient has no prior history of Echocardiogram examinations.                  TIA; Risk Factors:Hypertension. LBBB.  Sonographer:     Sherrie Sport RDCS (  AE) Referring Phys:  [id number] [place] Diagnosing Phys: Serafina Royals MD IMPRESSIONS  1. Left ventricular ejection fraction, by estimation, is 55 to 60%. The left ventricle has normal function. The left ventricle has no regional wall motion abnormalities. Left ventricular diastolic parameters were  normal.  2. Right ventricular systolic function is normal. The right ventricular size is normal.  3. Left atrial size was moderately dilated.  4. Right atrial size was mildly dilated.  5. The mitral valve is normal in structure. Moderate mitral valve regurgitation.  6. The aortic valve is normal in structure. Aortic valve regurgitation is mild. FINDINGS  Left Ventricle: Left ventricular ejection fraction, by estimation, is 55 to 60%. The left ventricle has normal function. The left ventricle has no regional wall motion abnormalities. The left ventricular internal cavity size was normal in size. There is  no left ventricular hypertrophy. Left ventricular diastolic parameters were normal. Right Ventricle: The right ventricular size is normal. No increase in right ventricular wall thickness. Right ventricular systolic function is normal. Left Atrium: Left atrial size was moderately dilated. Right Atrium: Right atrial size was mildly dilated. Pericardium: There is no evidence of pericardial effusion. Mitral Valve: The mitral valve is normal in structure. Moderate mitral valve regurgitation. Tricuspid Valve: The tricuspid valve is normal in structure. Tricuspid valve regurgitation is mild. Aortic Valve: The aortic valve is normal in structure. Aortic valve regurgitation is mild. Aortic valve mean gradient measures 3.0 mmHg. Aortic valve peak gradient measures 4.4 mmHg. Aortic valve area, by VTI measures 2.89 cm. Pulmonic Valve: The pulmonic valve was normal in structure. Pulmonic valve regurgitation is not visualized. Aorta: The aortic root and ascending aorta are structurally normal, with no evidence of dilitation. IAS/Shunts: No atrial level shunt detected by color flow Doppler.  LEFT VENTRICLE PLAX 2D LVIDd:         3.36 cm LVIDs:         2.14 cm LV PW:         1.20 cm LV IVS:        0.94 cm LVOT diam:     2.10 cm LV SV:         67 LV SV Index:   42 LVOT Area:     3.46 cm  RIGHT VENTRICLE RV Basal diam:  2.60 cm RV  S prime:     12.00 cm/s TAPSE (M-mode): 3.1 cm LEFT ATRIUM              Index        RIGHT ATRIUM           Index LA diam:        3.90 cm  2.47 cm/m   RA Area:     21.10 cm LA Vol (A2C):   172.0 ml 109.08 ml/m RA Volume:   56.10 ml  35.58 ml/m LA Vol (A4C):   78.9 ml  50.04 ml/m LA Biplane Vol: 118.0 ml 74.83 ml/m  AORTIC VALVE AV Area (Vmax):    3.03 cm AV Area (Vmean):   2.57 cm AV Area (VTI):     2.89 cm AV Vmax:           105.00 cm/s AV Vmean:          78.800 cm/s AV VTI:            0.231 m AV Peak Grad:      4.4 mmHg AV Mean Grad:      3.0 mmHg LVOT Vmax:  92.00 cm/s LVOT Vmean:        58.500 cm/s LVOT VTI:          0.193 m LVOT/AV VTI ratio: 0.84  AORTA Ao Root diam: 3.15 cm MITRAL VALVE               TRICUSPID VALVE MV Area (PHT): 3.70 cm    TR Peak grad:   43.8 mmHg MV Decel Time: 205 msec    TR Vmax:        331.00 cm/s MV E velocity: 97.30 cm/s                            SHUNTS                            Systemic VTI:  0.19 m                            Systemic Diam: 2.10 cm Serafina Royals MD Electronically signed by Serafina Royals MD Signature Date/Time: 12/02/2020/3:10:29 PM    Final                LOS: 1 day   Paidyn Mcferran  [facility]   Pager on [email or web address]. If 7PM-7AM, please contact night-coverage at [email or web address]     12/03/2020, 11:46 AM

## 2020-12-04 ENCOUNTER — Encounter
Admission: EM | Disposition: A | Discharge status: Home Health | Payer: Self-pay | Source: Home / Self Care | Attending: Internal Medicine

## 2020-12-04 ENCOUNTER — Inpatient Hospital Stay: Payer: Medicare Other | Admitting: Anesthesiology

## 2020-12-04 ENCOUNTER — Inpatient Hospital Stay: Payer: Medicare Other | Admitting: Certified Registered"

## 2020-12-04 ENCOUNTER — Encounter: Payer: Self-pay | Admitting: Internal Medicine

## 2020-12-04 DIAGNOSIS — R1319 Other dysphagia: Secondary | ICD-10-CM

## 2020-12-04 HISTORY — PX: ESOPHAGOGASTRODUODENOSCOPY (EGD) WITH PROPOFOL: SHX5813

## 2020-12-04 LAB — IRON AND TIBC
Iron: 90 ug/dL (ref 28–170)
Saturation Ratios: 22 % (ref 10.4–31.8)
TIBC: 413 ug/dL (ref 250–450)
UIBC: 323 ug/dL

## 2020-12-04 LAB — MAGNESIUM: Magnesium: 1.9 mg/dL (ref 1.7–2.4)

## 2020-12-04 LAB — BASIC METABOLIC PANEL
Anion gap: 8 (ref 5–15)
BUN: 49 mg/dL — ABNORMAL HIGH (ref 8–23)
CO2: 30 mmol/L (ref 22–32)
Calcium: 8.5 mg/dL — ABNORMAL LOW (ref 8.9–10.3)
Chloride: 103 mmol/L (ref 98–111)
Creatinine, Ser: 1.41 mg/dL — ABNORMAL HIGH (ref 0.44–1.00)
GFR, Estimated: 34 mL/min — ABNORMAL LOW (ref >60)
Glucose, Bld: 90 mg/dL (ref 70–99)
Potassium: 4.2 mmol/L (ref 3.5–5.1)
Sodium: 141 mmol/L (ref 135–145)

## 2020-12-04 LAB — BRAIN NATRIURETIC PEPTIDE: B Natriuretic Peptide: 664.4 pg/mL — ABNORMAL HIGH (ref 0.0–100.0)

## 2020-12-04 LAB — FERRITIN: Ferritin: 41 ng/mL (ref 11–307)

## 2020-12-04 LAB — FOLATE: Folate: 28 ng/mL (ref >5.9)

## 2020-12-04 LAB — VITAMIN B12: Vitamin B-12: 1209 pg/mL — ABNORMAL HIGH (ref 180–914)

## 2020-12-04 SURGERY — ESOPHAGOGASTRODUODENOSCOPY (EGD) WITH PROPOFOL
Anesthesia: General

## 2020-12-04 MED ORDER — SODIUM CHLORIDE FLUSH 0.9 % IV SOLN
INTRAVENOUS | Status: AC
  Filled 2020-12-04: qty 10

## 2020-12-04 MED ORDER — SIMETHICONE 80 MG PO CHEW: 80.0000 mg | CHEWABLE_TABLET | Freq: Four times a day (QID) | ORAL | Status: DC | PRN

## 2020-12-04 MED ORDER — PROPOFOL 10 MG/ML IV BOLUS
INTRAVENOUS | Status: DC | PRN
  Administered 2020-12-04: 10 mg via INTRAVENOUS
  Administered 2020-12-04: 20 mg via INTRAVENOUS

## 2020-12-04 MED ORDER — LIDOCAINE HCL (CARDIAC) PF 100 MG/5ML IV SOSY
PREFILLED_SYRINGE | INTRAVENOUS | Status: DC | PRN
  Administered 2020-12-04: 50 mg via INTRAVENOUS

## 2020-12-04 MED ORDER — OMEPRAZOLE MAGNESIUM 20 MG PO TBEC: 20.0000 mg | DELAYED_RELEASE_TABLET | Freq: Every day | ORAL | 0 refills | Status: AC

## 2020-12-04 MED ORDER — ACETAMINOPHEN ER 650 MG PO TBCR: 1300.0000 mg | EXTENDED_RELEASE_TABLET | Freq: Two times a day (BID) | ORAL | Status: DC | PRN

## 2020-12-04 MED ORDER — FUROSEMIDE 20 MG PO TABS
20.0000 mg | ORAL_TABLET | Freq: Every day | ORAL | Status: DC
  Administered 2020-12-04: 20 mg via ORAL
  Filled 2020-12-04 (×2): qty 1

## 2020-12-04 NOTE — Progress Notes (Signed)
SATURATION QUALIFICATIONS: (This note is used to comply with regulatory documentation for home oxygen)  Patient Saturations on Room Air at Rest = 97%  Patient Saturations on Room Air while Ambulating = 90-92%%

## 2020-12-04 NOTE — Anesthesia Procedure Notes (Signed)
Procedure Name: General with mask airway Performed by: Fletcher-Harrison, Jadee Golebiewski, CRNA Pre-anesthesia Checklist: Patient identified, Emergency Drugs available, Suction available and Patient being monitored Patient Re-evaluated:Patient Re-evaluated prior to induction Oxygen Delivery Method: Simple face mask Induction Type: IV induction Placement Confirmation: positive ETCO2 and CO2 detector Dental Injury: Teeth and Oropharynx as per pre-operative assessment        

## 2020-12-04 NOTE — Assessment & Plan Note (Signed)
Patient was referred to the pain center for control of pain.

## 2020-12-04 NOTE — Assessment & Plan Note (Signed)
Refer to GI 

## 2020-12-04 NOTE — Anesthesia Preprocedure Evaluation (Signed)
Anesthesia Evaluation  Patient identified by MRN, date of birth, ID band Patient awake    Reviewed: Allergy & Precautions, H&P , NPO status , Patient's Chart, lab work & pertinent test results, reviewed documented beta blocker date and time   Airway Mallampati: II   Neck ROM: full    Dental  (+) Poor Dentition   Pulmonary shortness of breath and with exertion,    Pulmonary exam normal        Cardiovascular Exercise Tolerance: Poor hypertension, On Medications +CHF  Normal cardiovascular exam+ dysrhythmias + pacemaker + Valvular Problems/Murmurs  Rhythm:regular Rate:Normal     Neuro/Psych negative neurological ROS  negative psych ROS   GI/Hepatic negative GI ROS, Neg liver ROS,   Endo/Other  negative endocrine ROS  Renal/GU negative Renal ROS  negative genitourinary   Musculoskeletal   Abdominal   Peds  Hematology negative hematology ROS (+)   Anesthesia Other Findings Past Medical History: No date: Arthritis 2013: Breast cancer ([place])     Comment:  left breast, radiation and lumpectomy No date: Cataract No date: Fatigue No date: GERD (gastroesophageal reflux disease) No date: Glaucoma No date: Heart murmur No date: Heart palpitations No date: Hiatal hernia No date: HOH (hard of hearing)     Comment:  AIDS No date: Hyperlipidemia No date: Hypertension No date: Hypothyroidism No date: Left ankle injury     Comment:  Old left ankle injury No date: Left bundle branch block No date: Neuropathy No date: Osteoporosis No date: Pacemaker No date: Palpitations 2013: Personal history of radiation therapy     Comment:  left breast ca No date: Scoliosis No date: Shortness of breath dyspnea No date: Thyroid nodule No date: TIA (transient ischemic attack) Past Surgical History: No date: ABDOMINAL HYSTERECTOMY 2005: BREAST BIOPSY; Left     Comment:  bengin 2013: BREAST BIOPSY; Left     Comment:  stereo bx,  invasive tubular carcinoma and DCIS 02/21/2012: BREAST LUMPECTOMY; Left     Comment:  invasive tubular carcinoma and DCIS, clear margins,               negative LN No date: BREAST SURGERY     Comment:  LUMPECTOMY 03/01/2015: CATARACT EXTRACTION W/PHACO; Right     Comment:  Procedure: CATARACT EXTRACTION PHACO AND INTRAOCULAR               LENS PLACEMENT ([place]);  Surgeon: Birder Robson, MD;                Location: [facility] ORS;  Service: Ophthalmology;  Laterality:              Right;  US:1:03.1 AP:22.8 CDE:14.37  Fluid lot #               [id number] 03/22/2015: CATARACT EXTRACTION W/PHACO; Left     Comment:  Procedure: CATARACT EXTRACTION PHACO AND INTRAOCULAR               LENS PLACEMENT ([facility]);  Surgeon: Birder Robson, MD;                Location: [facility] ORS;  Service: Ophthalmology;  Laterality:              Left;  [place]: 01:01.0 AP%: 22.5% CDE: 13.75 Lot #               [id number] 09/2020 and 10/2020: EYE SURGERY No date: MASTECTOMY PARTIAL / LUMPECTOMY No date: THYROIDECTOMY, PARTIAL BMI    Body Mass Index: 26.26 kg/m  Reproductive/Obstetrics negative OB ROS                             Anesthesia Physical Anesthesia Plan  ASA: III  Anesthesia Plan: General   Post-op Pain Management:    Induction:   PONV Risk Score and Plan:   Airway Management Planned:   Additional Equipment:   Intra-op Plan:   Post-operative Plan:   Informed Consent: I have reviewed the patients History and Physical, chart, labs and discussed the procedure including the risks, benefits and alternatives for the proposed anesthesia with the patient or authorized representative who has indicated his/her understanding and acceptance.     Dental Advisory Given  Plan Discussed with: CRNA  Anesthesia Plan Comments:         Anesthesia Quick Evaluation

## 2020-12-04 NOTE — Assessment & Plan Note (Signed)
Pacemaker is working well.  Patient was found to have a run of atrial fibrillation so she was told to continue taking the beta-blockers.  He has been seen in the emergency room recently with the report of the CT scan reviewed with the patient.  She was told to go to the hospital if she become more symptomatic

## 2020-12-04 NOTE — Anesthesia Postprocedure Evaluation (Signed)
Anesthesia Post Note  Patient: Sue Knapp  Procedure(s) Performed: ESOPHAGOGASTRODUODENOSCOPY (EGD) WITH PROPOFOL (N/A )  Patient location during evaluation: PACU Anesthesia Type: General Level of consciousness: awake and alert Pain management: pain level controlled Vital Signs Assessment: post-procedure vital signs reviewed and stable Respiratory status: spontaneous breathing, nonlabored ventilation, respiratory function stable and patient connected to nasal cannula oxygen Cardiovascular status: blood pressure returned to baseline and stable Postop Assessment: no apparent nausea or vomiting Anesthetic complications: no   No complications documented.   Last Vitals:  Vitals:   12/04/20 0846 12/04/20 0859  BP: (!) 143/61   Pulse: (!) 58   Resp: 20   Temp: 36.6 C   SpO2: 100% 95%    Last Pain:  Vitals:   12/04/20 0846  TempSrc: Oral  PainSc: 0-No pain                 Molli Barrows

## 2020-12-04 NOTE — Progress Notes (Signed)
Discharge instructions and medication details reviewed with patient and daughter. All questions answered. AVS given to patient. IV removed. CHF information provided.

## 2020-12-04 NOTE — Progress Notes (Signed)
Established Patient Office Visit  Subjective:  Patient ID: Sue Knapp, female    DOB: Feb 09, 1923  Age: 87 y.o. MRN: [id number]  CC:  Chief Complaint  Patient presents with  . Pacemaker Check    HPI  Sue Knapp presents for pacemaker check.  Patient is known to have multiple joint arthritis, she was seen in the emergency room with a history of fall and CT scan was obtained.  Patient is known to have fatigue chronic pain syndrome she lives in a nursing home.  Past Medical History:  Diagnosis Date  . Arthritis   . Breast cancer ([place]) 2013   left breast, radiation and lumpectomy  . Cataract   . Fatigue   . GERD (gastroesophageal reflux disease)   . Glaucoma   . Heart murmur   . Heart palpitations   . Hiatal hernia   . HOH (hard of hearing)    AIDS  . Hyperlipidemia   . Hypertension   . Hypothyroidism   . Left ankle injury    Old left ankle injury  . Left bundle branch block   . Neuropathy   . Osteoporosis   . Pacemaker   . Palpitations   . Personal history of radiation therapy 2013   left breast ca  . Scoliosis   . Shortness of breath dyspnea   . Thyroid nodule   . TIA (transient ischemic attack)     Past Surgical History:  Procedure Laterality Date  . ABDOMINAL HYSTERECTOMY    . BREAST BIOPSY Left 2005   bengin  . BREAST BIOPSY Left 2013   stereo bx, invasive tubular carcinoma and DCIS  . 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 BREAST SURGERY     LUMPECTOMY  . CATARACT EXTRACTION W/PHACO Right 03/01/2015   Procedure: CATARACT EXTRACTION PHACO AND INTRAOCULAR LENS PLACEMENT (IOC);  Surgeon: Birder Robson, MD;  Location: [facility] ORS;  Service: Ophthalmology;  Laterality: Right;  US:1:03.1 AP:22.8 CDE:14.37  Fluid lot # [id number]  . CATARACT EXTRACTION W/PHACO Left 03/22/2015   Procedure: CATARACT EXTRACTION PHACO AND INTRAOCULAR LENS PLACEMENT (IOC);  Surgeon: Birder Robson, MD;   Location: [facility] ORS;  Service: Ophthalmology;  Laterality: Left;  [place]: 01:01.0 AP%: 22.5% CDE: 13.75 Lot # [id number]  . EYE SURGERY  09/2020 and 10/2020  . MASTECTOMY PARTIAL / LUMPECTOMY    . THYROIDECTOMY, PARTIAL      Family History  Problem Relation Age of Onset  . Breast cancer Neg Hx     Social History   Socioeconomic History  . Marital status: Widowed    Spouse name: Not on file  . Number of children: Not on file  . Years of education: Not on file  . Highest education level: Not on file  Occupational History  . Not on file  Tobacco Use  . Smoking status: Never Smoker  . Smokeless tobacco: Never Used  Substance and Sexual Activity  . Alcohol use: No  . Drug use: Yes    Comment: pain clinic: tramadol  . Sexual activity: Not on file  Other Topics Concern  . Not on file  Social History Narrative  . Not on file   Social Determinants of Health   Financial Resource Strain: Not on file  Food Insecurity: Not on file  Transportation Needs: Not on file  Physical Activity: Not on file  Stress: Not on file  Social Connections: Not on file  Intimate Partner Violence: Not on  file    No current facility-administered medications for this visit.  Current Outpatient Medications:  .  acetaminophen (TYLENOL) 650 MG CR tablet, Take 2 tablets (1,300 mg total) by mouth 2 (two) times daily as needed for pain., Disp: , Rfl:  .  omeprazole (PRILOSEC OTC) 20 MG tablet, Take 1 tablet (20 mg total) by mouth daily., Disp: 30 tablet, Rfl: 0  Facility-Administered Medications Ordered in Other Visits:  .  acetaminophen (TYLENOL) tablet 1,000 mg, 1,000 mg, Oral, BID, Pokhrel, Laxman, MD, 1,000 mg at 12/03/20 2048 .  albuterol (VENTOLIN HFA) 108 (90 Base) MCG/ACT inhaler 2 puff, 2 puff, Inhalation, Q6H PRN, Pokhrel, Laxman, MD .  cholecalciferol (VITAMIN D3) tablet 2,000 Units, 2,000 Units, Oral, Daily, Pokhrel, Laxman, MD, 2,000 Units at 12/04/20 1307 .  docusate sodium (COLACE) capsule  100-200 mg, 100-200 mg, Oral, QHS PRN, Pokhrel, Laxman, MD .  furosemide (LASIX) tablet 20 mg, 20 mg, Oral, Daily, Jennye Boroughs, MD, 20 mg at 12/04/20 1308 .  hydrALAZINE (APRESOLINE) injection 5 mg, 5 mg, Intravenous, Q6H PRN, Irene Pap N, DO, 5 mg at 12/02/20 0450 .  metoprolol succinate (TOPROL-XL) 24 hr tablet 50 mg, 50 mg, Oral, Daily, Hall, Carole N, DO, 50 mg at 12/04/20 1308 .  ondansetron (ZOFRAN) tablet 4 mg, 4 mg, Oral, Q6H PRN, 4 mg at 12/02/20 0525 **OR** ondansetron (ZOFRAN) injection 4 mg, 4 mg, Intravenous, Q6H PRN, Pokhrel, Laxman, MD .  pantoprazole (PROTONIX) EC tablet 40 mg, 40 mg, Oral, BID AC, Vanga, Tally Due, MD, 40 mg at 12/03/20 1647 .  pregabalin (LYRICA) capsule 50 mg, 50 mg, Oral, TID, Pokhrel, Laxman, MD, 50 mg at 12/03/20 2048 .  senna-docusate (Senokot-S) tablet 1 tablet, 1 tablet, Oral, QHS PRN, Pokhrel, Laxman, MD, 1 tablet at 12/01/20 2309 .  simethicone (MYLICON) chewable tablet 80 mg, 80 mg, Oral, QID PRN, Jennye Boroughs, MD .  sodium chloride flush 0.9 % injection, , , ,  .  traMADol (ULTRAM) tablet 50 mg, 50 mg, Oral, Q12H, Pokhrel, Laxman, MD, 50 mg at 12/03/20 2048   Allergies  Allergen Reactions  . Codeine Other (See Comments)    ROS Review of Systems  Constitutional: Negative for appetite change.  HENT: Negative for congestion.   Respiratory: Negative for chest tightness and shortness of breath.   Gastrointestinal: Negative for abdominal pain.  Genitourinary: Negative for difficulty urinating.  Musculoskeletal: Positive for arthralgias.      Objective:    Physical Exam HENT:     Head: Normocephalic.     Nose: Nose normal.  Eyes:     Pupils: Pupils are equal, round, and reactive to light.  Cardiovascular:     Rate and Rhythm: Regular rhythm.  Pulmonary:     Effort: Pulmonary effort is normal.  Musculoskeletal:        General: Tenderness present.     Cervical back: Normal range of motion.  Skin:    General: Skin is warm.   Neurological:     General: No focal deficit present.     There were no vitals taken for this visit. Wt Readings from Last 3 Encounters:  12/02/20 134 lb 7.7 oz (61 kg)  11/04/20 141 lb (64 kg)  11/03/20 140 lb (63.5 kg)     Health Maintenance Due  Topic Date Due  . COVID-19 Vaccine (1) Never done  . PNA vac Low Risk Adult (2 of 2 - PCV13) 05/10/2016    There are no preventive care reminders to display for this patient.  Lab Results  Component Value Date   TSH 1.84 05/23/2013   Lab Results  Component Value Date   WBC 7.5 12/02/2020   HGB 11.6 (L) 12/02/2020   HCT 36.2 12/02/2020   MCV 90.5 12/02/2020   PLT 244 12/02/2020   Lab Results  Component Value Date   NA 141 12/04/2020   K 4.2 12/04/2020   CO2 30 12/04/2020   GLUCOSE 90 12/04/2020   BUN 49 (H) 12/04/2020   CREATININE 1.41 (H) 12/04/2020   BILITOT 0.8 12/01/2020   ALKPHOS 92 12/01/2020   AST 35 12/01/2020   ALT 41 12/01/2020   PROT 6.0 (L) 12/01/2020   ALBUMIN 3.7 12/01/2020   CALCIUM 8.5 (L) 12/04/2020   ANIONGAP 8 12/04/2020   Lab Results  Component Value Date   CHOL 214 (H) 04/11/2013   Lab Results  Component Value Date   HDL 65 (H) 04/11/2013   Lab Results  Component Value Date   LDLCALC 131 (H) 04/11/2013   Lab Results  Component Value Date   TRIG 89 04/11/2013   No results found for: CHOLHDL No results found for: HGBA1C    Assessment & Plan:   Problem List Items Addressed This Visit      Cardiovascular and Mediastinum   Sick sinus syndrome due to SA node dysfunction ([place])    Pacemaker is working well.  Patient was found to have a run of atrial fibrillation so she was told to continue taking the beta-blockers.  He has been seen in the emergency room recently with the report of the CT scan reviewed with the patient.  She was told to go to the hospital if she become more symptomatic      Essential hypertension    Blood pressure is stable.        Digestive   Dysphagia     Refer to GI.        Musculoskeletal and Integument   Primary osteoarthritis of right knee    Patient was referred to the pain center for control of pain.      Relevant Medications   methylPREDNISolone (MEDROL DOSEPAK) 4 MG TBPK tablet   Rotator cuff arthropathy of left shoulder    Patient is seeing the orthopedic surgeon.        Other   Pacemaker    Pacemaker is functioning well      Relevant Orders   PACEMAKER IN CLINIC CHECK   Shortness of breath   Relevant Orders   POC COVID-19 (Completed)    Other Visit Diagnoses    Atrial fibrillation, unspecified type ([place])    -  Primary   Relevant Orders   EKG 12-Lead   CBC with Differential/Platelet (Completed)   COMPLETE METABOLIC PANEL WITH GFR (Completed)      Meds ordered this encounter  Medications  . azithromycin (ZITHROMAX) 250 MG tablet    Sig: Take 2 tablets on day 1, then 1 tablet daily on days 2 through 5    Dispense:  6 tablet    Refill:  0  . methylPREDNISolone (MEDROL DOSEPAK) 4 MG TBPK tablet    Sig: As directed    Dispense:  21 each    Refill:  0  Assessment & Plan:  Note: Medical Device Follow-up  Patient pacemaker was interrogated by pacemakers analyzer, battery status is okay.  No programming changes were indicated after the review of the data.  Histogram shows no change since the last interrogation Atrial and ventricular sensing thresholds were found to  be acceptable Impedance was checked and it was found to be normal.  Thresholds were found to be okay on evaluation of rhythm problem.  No high rate or low rate arrhythmia were noted.  Estimated battery longevity is 23 months. I have personally reviewed the device data and amended the report as necessary.  Patient has a history of atrial fibrillation EKG was performed which revealed normal sinus rhythm is a poor R wave progression V1 to V4  Follow-up: No follow-ups on file.    Cletis Athens, MD

## 2020-12-04 NOTE — Assessment & Plan Note (Signed)
Blood pressure is stable 

## 2020-12-04 NOTE — Assessment & Plan Note (Signed)
Patient is seeing the orthopedic surgeon.

## 2020-12-04 NOTE — Anesthesia Procedure Notes (Signed)
Procedure Name: General with mask airway Performed by: Fletcher-Harrison, Fatumata Kashani, CRNA Pre-anesthesia Checklist: Patient identified, Emergency Drugs available, Suction available and Patient being monitored Patient Re-evaluated:Patient Re-evaluated prior to induction Oxygen Delivery Method: Simple face mask Induction Type: IV induction Placement Confirmation: positive ETCO2 and CO2 detector Dental Injury: Teeth and Oropharynx as per pre-operative assessment        

## 2020-12-04 NOTE — Op Note (Addendum)
[facility] Gastroenterology Patient Name: Sue Knapp Procedure Date: 12/04/2020 10:01 AM MRN: [id number] Account #: [id number] Date of Birth: [birth year] Admit Type: Inpatient Age: 87 Room: [facility] ENDO ROOM 4 Gender: Female Note Status: Finalized Procedure:             Upper GI endoscopy Indications:           Dysphagia Providers:             Lin Landsman MD, MD Referring MD:          Cletis Athens, MD (Referring MD) Medicines:             General Anesthesia Complications:         No immediate complications. Estimated blood loss: None. Procedure:             Pre-Anesthesia Assessment:                        - Prior to the procedure, a History and Physical was                         performed, and patient medications and allergies were                         reviewed. The patient is competent. The risks and                         benefits of the procedure and the sedation options and                         risks were discussed with the patient. All questions                         were answered and informed consent was obtained.                         Patient identification and proposed procedure were                         verified by the physician, the nurse, the                         anesthesiologist, the anesthetist and the technician                         in the pre-procedure area in the procedure room in the                         endoscopy suite. Mental Status Examination: alert and                         oriented. Airway Examination: normal oropharyngeal                         airway and neck mobility. Respiratory Examination:                         clear to auscultation. CV Examination: irregularly  irregular rate and rhythm. Prophylactic Antibiotics:                         The patient does not require prophylactic antibiotics.                         Prior Anticoagulants: The patient has taken Eliquis                          (apixaban), last dose was 2 days prior to procedure.                         ASA Grade Assessment: III - A patient with severe                         systemic disease. After reviewing the risks and                         benefits, the patient was deemed in satisfactory                         condition to undergo the procedure. The anesthesia                         plan was to use general anesthesia. Immediately prior                         to administration of medications, the patient was                         re-assessed for adequacy to receive sedatives. The                         heart rate, respiratory rate, oxygen saturations,                         blood pressure, adequacy of pulmonary ventilation, and                         response to care were monitored throughout the                         procedure. The physical status of the patient was                         re-assessed after the procedure.                        After obtaining informed consent, the endoscope was                         passed under direct vision. Throughout the procedure,                         the patient's blood pressure, pulse, and oxygen                         saturations were monitored continuously. The Endoscope  was introduced through the mouth, and advanced to the                         second part of duodenum. The upper GI endoscopy was                         accomplished without difficulty. The patient tolerated                         the procedure well. Findings:      The duodenal bulb and second portion of the duodenum were normal.      The entire examined stomach was normal.      A small hiatal hernia was present.      The Z-line was irregular.      The examined esophagus was normal. Impression:            - Normal duodenal bulb and second portion of the                         duodenum.                        - Normal stomach.                         - Small hiatal hernia.                        - Z-line irregular.                        - Normal esophagus.                        - No specimens collected. Recommendation:        - Return patient to hospital ward for possible                         discharge same day.                        - Cardiac diet today.                        - Continue present medications.                        - Resume Eliquis (apixaban) today at prior dose.                        - Use Prilosec (omeprazole) 20 mg PO daily before                         breakfast long term. Procedure Code(s):     --- Professional ---                        [phone], Esophagogastroduodenoscopy, flexible,                         transoral; diagnostic, including collection of  specimen(s) by brushing or washing, when performed                         (separate procedure) Diagnosis Code(s):     --- Professional ---                        K44.9, Diaphragmatic hernia without obstruction or                         gangrene                        K22.8, Other specified diseases of esophagus                        R13.10, Dysphagia, unspecified CPT copyright 2019 American Medical Association. All rights reserved. The codes documented in this report are preliminary and upon coder review may  be revised to meet current compliance requirements. Dr. Ulyess Mort Lin Landsman MD, MD 12/04/2020 10:23:51 AM This report has been signed electronically. Number of Addenda: 0 Note Initiated On: 12/04/2020 10:01 AM Estimated Blood Loss:  Estimated blood loss: none.      [facility]

## 2020-12-04 NOTE — Assessment & Plan Note (Signed)
Pacemaker is functioning well

## 2020-12-04 NOTE — Anesthesia Preprocedure Evaluation (Signed)
Anesthesia Evaluation  Patient identified by MRN, date of birth, ID band Patient awake    Reviewed: Allergy & Precautions, H&P , NPO status , Patient's Chart, lab work & pertinent test results, reviewed documented beta blocker date and time   Airway Mallampati: II   Neck ROM: full    Dental  (+) Poor Dentition   Pulmonary shortness of breath and with exertion,    Pulmonary exam normal        Cardiovascular Exercise Tolerance: Poor hypertension, +CHF  + dysrhythmias Atrial Fibrillation + pacemaker + Valvular Problems/Murmurs  Rhythm:regular Rate:Normal     Neuro/Psych TIAnegative psych ROS   GI/Hepatic Neg liver ROS, hiatal hernia, GERD  Medicated,  Endo/Other  Hypothyroidism   Renal/GU negative Renal ROS  negative genitourinary   Musculoskeletal   Abdominal   Peds  Hematology negative hematology ROS (+)   Anesthesia Other Findings Past Medical History: No date: Arthritis 2013: Breast cancer ([place])     Comment:  left breast, radiation and lumpectomy No date: Cataract No date: Fatigue No date: GERD (gastroesophageal reflux disease) No date: Glaucoma No date: Heart murmur No date: Heart palpitations No date: Hiatal hernia No date: HOH (hard of hearing)     Comment:  AIDS No date: Hyperlipidemia No date: Hypertension No date: Hypothyroidism No date: Left ankle injury     Comment:  Old left ankle injury No date: Left bundle branch block No date: Neuropathy No date: Osteoporosis No date: Pacemaker No date: Palpitations 2013: Personal history of radiation therapy     Comment:  left breast ca No date: Scoliosis No date: Shortness of breath dyspnea No date: Thyroid nodule No date: TIA (transient ischemic attack) Past Surgical History: No date: ABDOMINAL HYSTERECTOMY 2005: BREAST BIOPSY; Left     Comment:  bengin 2013: BREAST BIOPSY; Left     Comment:  stereo bx, invasive tubular carcinoma and  DCIS 02/21/2012: BREAST LUMPECTOMY; Left     Comment:  invasive tubular carcinoma and DCIS, clear margins,               negative LN No date: BREAST SURGERY     Comment:  LUMPECTOMY 03/01/2015: CATARACT EXTRACTION W/PHACO; Right     Comment:  Procedure: CATARACT EXTRACTION PHACO AND INTRAOCULAR               LENS PLACEMENT ([place]);  Surgeon: Birder Robson, MD;                Location: [facility] ORS;  Service: Ophthalmology;  Laterality:              Right;  US:1:03.1 AP:22.8 CDE:14.37  Fluid lot #               [id number] 03/22/2015: CATARACT EXTRACTION W/PHACO; Left     Comment:  Procedure: CATARACT EXTRACTION PHACO AND INTRAOCULAR               LENS PLACEMENT (IOC);  Surgeon: Birder Robson, MD;                Location: [facility] ORS;  Service: Ophthalmology;  Laterality:              Left;  [place]: 01:01.0 AP%: 22.5% CDE: 13.75 Lot #               [id number] 09/2020 and 10/2020: EYE SURGERY No date: MASTECTOMY PARTIAL / LUMPECTOMY No date: THYROIDECTOMY, PARTIAL BMI    Body Mass Index: 26.26 kg/m     Reproductive/Obstetrics negative OB ROS  Anesthesia Physical Anesthesia Plan  ASA: III and emergent  Anesthesia Plan: General   Post-op Pain Management:    Induction:   PONV Risk Score and Plan:   Airway Management Planned:   Additional Equipment:   Intra-op Plan:   Post-operative Plan:   Informed Consent: I have reviewed the patients History and Physical, chart, labs and discussed the procedure including the risks, benefits and alternatives for the proposed anesthesia with the patient or authorized representative who has indicated his/her understanding and acceptance.     Dental Advisory Given  Plan Discussed with: CRNA  Anesthesia Plan Comments:         Anesthesia Quick Evaluation

## 2020-12-04 NOTE — Discharge Summary (Addendum)
Physician Discharge Summary  Sue Knapp [id number] DOB: 1922/08/30 DOA: 12/01/2020  PCP: Cletis Athens, MD  Admit date: 12/01/2020 Discharge date: 12/04/2020  Discharge disposition: Home with home physical therapy   Recommendations for Outpatient Follow-Up:   Follow-up with PCP in 1 week   Discharge Diagnosis:   Principal Problem:   Dysphagia Active Problems:   Pacemaker   Shortness of breath   CHF (congestive heart failure) ([place])    Discharge Condition: Stable.  Diet recommendation:  Diet Order            Diet Heart Room service appropriate? Yes; Fluid consistency: Thin  Diet effective now           Diet - low sodium heart healthy                   Code Status: Full Code     Hospital Course:   [name] is a 87 y.o. female with medical history significant for paroxysmal atrial fibrillation on Eliquis, breast cancer s/p lumpectomy and radiation therapy in 2013, depression, GERD, hypothyroidism, hypertension, hyperlipidemia, chronic diastolic CHF, s/p permanent pacemaker, TIA, presented to the hospital with shortness of breath, difficulty swallowing, sensation of tightness around her diaphragm.  She was admitted to the hospital for acute on chronic diastolic CHF and dysphagia.  She was treated with IV Lasix.  Gastroenterologist was consulted.  She underwent EGD which showed a small hiatal hernia but was otherwise unremarkable.  Her condition has improved and she is deemed stable for discharge to home today.  On room air, oxygen saturation at rest was 97% and while ambulating was 90 to 92%.  Discharge plan was discussed with the patient and her daughter, Sue Knapp, at the bedside.      Discharge Exam:    Vitals:   12/04/20 1030 12/04/20 1043 12/04/20 1051 12/04/20 1105  BP: 93/75 139/64 (!) 144/74 (!) 152/79  Pulse: (!) 58 (!) 58 (!) 58 62  Resp: 18 16 18 18   Temp: 98.2 F (36.8 C)  98.1 F (36.7 C) (!) 97.5 F (36.4 C)  TempSrc:     Oral  SpO2: 99% 96% 94% 92%  Weight:      Height:         GEN: NAD SKIN: No rash EYES: EOMI ENT: MMM CV: RRR PULM: Mild bibasilar rales. No wheezing. ABD: soft, ND, NT, +BS CNS: AAO x 3, non focal EXT: No edema or tenderness   The results of significant diagnostics from this hospitalization (including imaging, microbiology, ancillary and laboratory) are listed below for reference.     Procedures and Diagnostic Studies:   ECHOCARDIOGRAM COMPLETE  Result Date: 12/02/2020    ECHOCARDIOGRAM REPORT   Patient Name:   Sue Knapp Date of Exam: 12/02/2020 Medical Rec #:  [id number]         Height:       60.0 in Accession #:    [id number]        Weight:       134.5 lb Date of Birth:  [birth year]         BSA:          1.577 m Patient Age:    87 years          BP:           170/89 mmHg Patient Gender: F                 HR:  59 bpm. Exam Location:  [facility] Procedure: Cardiac Doppler and Color Doppler Indications:     CHF-acute diastolic E31.54  History:         Patient has no prior history of Echocardiogram examinations.                  TIA; Risk Factors:Hypertension. LBBB.  Sonographer:     Sherrie Sport RDCS (AE) Referring Phys:  [id number] [facility] Diagnosing Phys: Serafina Royals MD IMPRESSIONS  1. Left ventricular ejection fraction, by estimation, is 55 to 60%. The left ventricle has normal function. The left ventricle has no regional wall motion abnormalities. Left ventricular diastolic parameters were normal.  2. Right ventricular systolic function is normal. The right ventricular size is normal.  3. Left atrial size was moderately dilated.  4. Right atrial size was mildly dilated.  5. The mitral valve is normal in structure. Moderate mitral valve regurgitation.  6. The aortic valve is normal in structure. Aortic valve regurgitation is mild. FINDINGS  Left Ventricle: Left ventricular ejection fraction, by estimation, is 55 to 60%. The left ventricle has normal function. The left  ventricle has no regional wall motion abnormalities. The left ventricular internal cavity size was normal in size. There is  no left ventricular hypertrophy. Left ventricular diastolic parameters were normal. Right Ventricle: The right ventricular size is normal. No increase in right ventricular wall thickness. Right ventricular systolic function is normal. Left Atrium: Left atrial size was moderately dilated. Right Atrium: Right atrial size was mildly dilated. Pericardium: There is no evidence of pericardial effusion. Mitral Valve: The mitral valve is normal in structure. Moderate mitral valve regurgitation. Tricuspid Valve: The tricuspid valve is normal in structure. Tricuspid valve regurgitation is mild. Aortic Valve: The aortic valve is normal in structure. Aortic valve regurgitation is mild. Aortic valve mean gradient measures 3.0 mmHg. Aortic valve peak gradient measures 4.4 mmHg. Aortic valve area, by VTI measures 2.89 cm. Pulmonic Valve: The pulmonic valve was normal in structure. Pulmonic valve regurgitation is not visualized. Aorta: The aortic root and ascending aorta are structurally normal, with no evidence of dilitation. IAS/Shunts: No atrial level shunt detected by color flow Doppler.  LEFT VENTRICLE PLAX 2D LVIDd:         3.36 cm LVIDs:         2.14 cm LV PW:         1.20 cm LV IVS:        0.94 cm LVOT diam:     2.10 cm LV SV:         67 LV SV Index:   42 LVOT Area:     3.46 cm  RIGHT VENTRICLE RV Basal diam:  2.60 cm RV S prime:     12.00 cm/s TAPSE (M-mode): 3.1 cm LEFT ATRIUM              Index        RIGHT ATRIUM           Index LA diam:        3.90 cm  2.47 cm/m   RA Area:     21.10 cm LA Vol (A2C):   172.0 ml 109.08 ml/m RA Volume:   56.10 ml  35.58 ml/m LA Vol (A4C):   78.9 ml  50.04 ml/m LA Biplane Vol: 118.0 ml 74.83 ml/m  AORTIC VALVE AV Area (Vmax):    3.03 cm AV Area (Vmean):   2.57 cm AV Area (VTI):     2.89 cm AV  Vmax:           105.00 cm/s AV Vmean:          78.800 cm/s AV  VTI:            0.231 m AV Peak Grad:      4.4 mmHg AV Mean Grad:      3.0 mmHg LVOT Vmax:         92.00 cm/s LVOT Vmean:        58.500 cm/s LVOT VTI:          0.193 m LVOT/AV VTI ratio: 0.84  AORTA Ao Root diam: 3.15 cm MITRAL VALVE               TRICUSPID VALVE MV Area (PHT): 3.70 cm    TR Peak grad:   43.8 mmHg MV Decel Time: 205 msec    TR Vmax:        331.00 cm/s MV E velocity: 97.30 cm/s                            SHUNTS                            Systemic VTI:  0.19 m                            Systemic Diam: 2.10 cm Serafina Royals MD Electronically signed by Serafina Royals MD Signature Date/Time: 12/02/2020/3:10:29 PM    Final      Labs:   Basic Metabolic Panel: Recent Labs  Lab 11/28/20 1128 12/01/20 1508 12/02/20 0408 12/03/20 0403 12/04/20 0355  NA 142 140 143 140 141  K 4.3 3.6 3.8 4.2 4.2  CL 103 102 102 100 103  CO2 27 26 28 31 30   GLUCOSE 80 155* 96 102* 90  BUN 43* 48* 46* 51* 49*  CREATININE 1.39* 1.37* 1.15* 1.51* 1.41*  CALCIUM 9.6 8.8* 9.0 8.8* 8.5*  MG  --  1.7 1.8 1.9 1.9   GFR Estimated Creatinine Clearance: 19.5 mL/min (A) (by C-G formula based on SCr of 1.41 mg/dL (H)). Liver Function Tests: Recent Labs  Lab 11/28/20 1128 12/01/20 1508  AST 31 35  ALT 43* 41  ALKPHOS  --  92  BILITOT 0.7 0.8  PROT 5.9* 6.0*  ALBUMIN  --  3.7   No results for input(s): LIPASE, AMYLASE in the last 168 hours. No results for input(s): AMMONIA in the last 168 hours. Coagulation profile No results for input(s): INR, PROTIME in the last 168 hours.  CBC: Recent Labs  Lab 11/28/20 1128 12/01/20 1508 12/02/20 0408  WBC 7.8 8.0 7.5  NEUTROABS 6,513  --   --   HGB 11.4* 11.3* 11.6*  HCT 36.1 35.1* 36.2  MCV 89.1 90.0 90.5  PLT 289 249 244   Cardiac Enzymes: No results for input(s): CKTOTAL, CKMB, CKMBINDEX, TROPONINI in the last 168 hours. BNP: Invalid input(s): POCBNP CBG: No results for input(s): GLUCAP in the last 168 hours. D-Dimer No results for  input(s): DDIMER in the last 72 hours. Hgb A1c No results for input(s): HGBA1C in the last 72 hours. Lipid Profile No results for input(s): CHOL, HDL, LDLCALC, TRIG, CHOLHDL, LDLDIRECT in the last 72 hours. Thyroid function studies No results for input(s): TSH, T4TOTAL, T3FREE, THYROIDAB in the last 72 hours.  Invalid input(s): FREET3 Anemia work up  No results for input(s): VITAMINB12, FOLATE, FERRITIN, TIBC, IRON, RETICCTPCT in the last 72 hours. Microbiology Recent Results (from the past 240 hour(s))  Resp Panel by RT-PCR (Flu A&B, Covid) Nasopharyngeal Swab     Status: None   Collection Time: 12/01/20  8:00 PM   Specimen: Nasopharyngeal Swab; Nasopharyngeal(NP) swabs in vial transport medium  Result Value Ref Range Status   SARS Coronavirus 2 by RT PCR NEGATIVE NEGATIVE Final    Comment: (NOTE) SARS-CoV-2 target nucleic acids are NOT DETECTED.  The SARS-CoV-2 RNA is generally detectable in upper respiratory specimens during the acute phase of infection. The lowest concentration of SARS-CoV-2 viral copies this assay can detect is 138 copies/mL. A negative result does not preclude SARS-Cov-2 infection and should not be used as the sole basis for treatment or other patient management decisions. A negative result may occur with  improper specimen collection/handling, submission of specimen other than nasopharyngeal swab, presence of viral mutation(s) within the areas targeted by this assay, and inadequate number of viral copies(<138 copies/mL). A negative result must be combined with clinical observations, patient history, and epidemiological information. The expected result is Negative.  Fact Sheet for Patients:  BloggerCourse.com  Fact Sheet for Healthcare Providers:  SeriousBroker.it  This test is no t yet approved or cleared by the [place] FDA and  has been authorized for detection and/or diagnosis of SARS-CoV-2  by FDA under an Emergency Use Authorization (EUA). This EUA will remain  in effect (meaning this test can be used) for the duration of the COVID-19 declaration under Section 564(b)(1) of the Act, 21 U.S.C.section 360bbb-3(b)(1), unless the authorization is terminated  or revoked sooner.       Influenza A by PCR NEGATIVE NEGATIVE Final   Influenza B by PCR NEGATIVE NEGATIVE Final    Comment: (NOTE) The Xpert Xpress SARS-CoV-2/FLU/RSV plus assay is intended as an aid in the diagnosis of influenza from Nasopharyngeal swab specimens and should not be used as a sole basis for treatment. Nasal washings and aspirates are unacceptable for Xpert Xpress SARS-CoV-2/FLU/RSV testing.  Fact Sheet for Patients: BloggerCourse.com  Fact Sheet for Healthcare Providers: SeriousBroker.it  This test is not yet approved or cleared by the [place] FDA and has been authorized for detection and/or diagnosis of SARS-CoV-2 by FDA under an Emergency Use Authorization (EUA). This EUA will remain in effect (meaning this test can be used) for the duration of the COVID-19 declaration under Section 564(b)(1) of the Act, 21 U.S.C. section 360bbb-3(b)(1), unless the authorization is terminated or revoked.  Performed at [facility], [address], [place], [place] [address]      Discharge Instructions:   Discharge Instructions    (HEART FAILURE PATIENTS) Call MD:  Anytime you have any of the following symptoms: 1) 3 pound weight gain in 24 hours or 5 pounds in 1 week 2) shortness of breath, with or without a dry hacking cough 3) swelling in the hands, feet or stomach 4) if you have to sleep on extra pillows at night in order to breathe.   Complete by: As directed    Diet - low sodium heart healthy   Complete by: As directed      Allergies as of 12/04/2020      Reactions   Codeine Other (See Comments)      Medication List    STOP  taking these medications   azithromycin 250 MG tablet Commonly known as: ZITHROMAX   Cholecalciferol 50 MCG (2000 UT) Tabs  hydrochlorothiazide 25 MG tablet Commonly known as: HYDRODIURIL   methylPREDNISolone 4 MG Tbpk tablet Commonly known as: MEDROL DOSEPAK     TAKE these medications   acetaminophen 650 MG CR tablet Commonly known as: TYLENOL Take 2 tablets (1,300 mg total) by mouth 2 (two) times daily as needed for pain. What changed:   when to take this  reasons to take this   albuterol 108 (90 Base) MCG/ACT inhaler Commonly known as: VENTOLIN HFA Inhale 2 puffs into the lungs every 6 (six) hours as needed for wheezing or shortness of breath.   ALPRAZolam 0.25 MG tablet Commonly known as: XANAX Take 1 tablet by mouth daily as needed.   docusate sodium 100 MG capsule Commonly known as: COLACE Take 1-2 capsules (100-200 mg total) by mouth at bedtime as needed for moderate constipation.   Eliquis 2.5 MG Tabs tablet Generic drug: apixaban TAKE 1 TABLET BY MOUTH TWICE DAILY   furosemide 20 MG tablet Commonly known as: LASIX Take 1 tablet (20 mg total) by mouth daily. What changed: Another medication with the same name was removed. Continue taking this medication, and follow the directions you see here.   losartan 100 MG tablet Commonly known as: COZAAR TAKE 1 TABLET BY MOUTH DAILY   metoprolol succinate 50 MG 24 hr tablet Commonly known as: TOPROL-XL TAKE 1 TABLET BY MOUTH DAILY   omeprazole 20 MG tablet Commonly known as: PriLOSEC OTC Take 1 tablet (20 mg total) by mouth daily.   pregabalin 50 MG capsule Commonly known as: LYRICA TAKE 1 CAPSULE 3 TIMES DAILY What changed: See the new instructions.   traMADol 100 MG 24 hr tablet Commonly known as: ULTRAM-ER Take 1 tablet (100 mg total) by mouth daily.         Time coordinating discharge: 31 minutes  Signed:  Kamylah Manzo  [facility] 12/04/2020, 12:18 PM   Pager on [email or web address].  If 7PM-7AM, please contact night-coverage at [email or web address]

## 2020-12-04 NOTE — Transfer of Care (Signed)
Immediate Anesthesia Transfer of Care Note  Patient: Sue Knapp  Procedure(s) Performed: ESOPHAGOGASTRODUODENOSCOPY (EGD) WITH PROPOFOL (N/A )  Patient Location: Endoscopy Unit  Anesthesia Type:General  Level of Consciousness: awake, drowsy and patient cooperative  Airway & Oxygen Therapy: Patient Spontanous Breathing and Patient connected to face mask oxygen  Post-op Assessment: Report given to RN and Post -op Vital signs reviewed and stable  Post vital signs: Reviewed and stable  Last Vitals:  Vitals Value Taken Time  BP 93/75 12/04/20 1030  Temp 36.8 C 12/04/20 1030  Pulse 60 12/04/20 1033  Resp 16 12/04/20 1033  SpO2 98 % 12/04/20 1033  Vitals shown include unvalidated device data.  Last Pain:  Vitals:   12/04/20 1030  TempSrc:   PainSc: 0-No pain         Complications: No complications documented.

## 2020-12-04 NOTE — Op Note (Addendum)
[facility] Gastroenterology Patient Name: Sue Knapp Procedure Date: 12/04/2020 9:12 AM MRN: [id number] Account #: [id number] Date of Birth: 10-16-1922 Age: 87 Gender: Female THIS EXAM WAS SENT IN ERROR

## 2020-12-05 ENCOUNTER — Ambulatory Visit (INDEPENDENT_AMBULATORY_CARE_PROVIDER_SITE_OTHER): Payer: Medicare Other | Admitting: Internal Medicine

## 2020-12-05 ENCOUNTER — Other Ambulatory Visit: Payer: Self-pay

## 2020-12-05 ENCOUNTER — Encounter: Payer: Self-pay | Admitting: Internal Medicine

## 2020-12-05 VITALS — BP 185/81 | HR 55 | Ht 60.0 in | Wt 135.7 lb

## 2020-12-05 DIAGNOSIS — G894 Chronic pain syndrome: Secondary | ICD-10-CM

## 2020-12-05 DIAGNOSIS — R1310 Dysphagia, unspecified: Secondary | ICD-10-CM

## 2020-12-05 DIAGNOSIS — I495 Sick sinus syndrome: Secondary | ICD-10-CM

## 2020-12-05 DIAGNOSIS — I10 Essential (primary) hypertension: Secondary | ICD-10-CM | POA: Diagnosis not present

## 2020-12-05 NOTE — Assessment & Plan Note (Signed)
Patient has extensive work-up in the hospital with CT scan, she has seen orthopedic surgeon and pain specialist.  No treatable cause of her arthritic symptoms is available at present time, decimating patient very nervous and she thinks she cannot live with this kind of pain.  We will try to put her on the skilled nursing

## 2020-12-05 NOTE — Assessment & Plan Note (Signed)
Blood pressure is elevated on today's visit

## 2020-12-05 NOTE — Assessment & Plan Note (Signed)
She complains of palpitation intermittently.  She has run of atrial fibrillation.  So I advised her to continue using her beta-blocker.  And Eliquis.

## 2020-12-05 NOTE — Assessment & Plan Note (Signed)
Recent endoscopy revealed hiatal hernia.

## 2020-12-05 NOTE — Progress Notes (Signed)
Established Patient Office Visit  Subjective:  Patient ID: Sue Knapp, female    DOB: [birth year]  Age: 87 y.o. MRN: [id number]  CC:  Chief Complaint  Patient presents with  . Hospitalization Follow-up    HPI  Sue Knapp presents for anxiety and nervousness she has been to the hospital twice and has been worked up completely recently she had an endoscopy.  Was found to have hiatal hernia.  Patient is most worried because she cannot take care of herself.  She lives in a  home and I suggested that she should go to a skilled nursing facility for rehab .  Patient complaining of dysphagia bloating feeling in the stomach has been seen by a gastroenterologist and underwent endoscopy.  She also has some confusion.  Complains of shortness of breath and gets panicky.  Also has hyperventilation.  Past Medical History:  Diagnosis Date  . Arthritis   . Breast cancer ([place]) 2013   left breast, radiation and lumpectomy  . Cataract   . Fatigue   . GERD (gastroesophageal reflux disease)   . Glaucoma   . Heart murmur   . Heart palpitations   . Hiatal hernia   . HOH (hard of hearing)    AIDS  . Hyperlipidemia   . Hypertension   . Hypothyroidism   . Left ankle injury    Old left ankle injury  . Left bundle branch block   . Neuropathy   . Osteoporosis   . Pacemaker   . Palpitations   . Personal history of radiation therapy 2013   left breast ca  . Scoliosis   . Shortness of breath dyspnea   . Thyroid nodule   . TIA (transient ischemic attack)     Past Surgical History:  Procedure Laterality Date  . ABDOMINAL HYSTERECTOMY    . BREAST BIOPSY Left 2005   [place]  . BREAST BIOPSY Left 2013   stereo bx, invasive tubular carcinoma and DCIS  . 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 BREAST SURGERY     LUMPECTOMY  . CATARACT EXTRACTION W/PHACO Right 03/01/2015   Procedure: CATARACT EXTRACTION PHACO AND INTRAOCULAR  LENS PLACEMENT (IOC);  Surgeon: Birder Robson, MD;  Location: [facility];  Service: Ophthalmology;  Laterality: Right;  US:1:03.1 AP:22.8 CDE:14.37  Fluid lot # [id number]  . CATARACT EXTRACTION W/PHACO Left 03/22/2015   Procedure: CATARACT EXTRACTION PHACO AND INTRAOCULAR LENS PLACEMENT (IOC);  Surgeon: Birder Robson, MD;  Location: [facility];  Service: Ophthalmology;  Laterality: Left;  [place]: 01:01.0 AP%: 22.5% CDE: 13.75 Lot # [id number]  . ESOPHAGOGASTRODUODENOSCOPY (EGD) WITH PROPOFOL N/A 12/04/2020   Procedure: ESOPHAGOGASTRODUODENOSCOPY (EGD) WITH PROPOFOL;  Surgeon: Lin Landsman, MD;  Location: [place];  Service: Gastroenterology;  Laterality: N/A;  . ESOPHAGOGASTRODUODENOSCOPY (EGD) WITH PROPOFOL N/A 12/04/2020   Procedure: ESOPHAGOGASTRODUODENOSCOPY (EGD) WITH PROPOFOL;  Surgeon: Lin Landsman, MD;  Location: [facility] ENDOSCOPY;  Service: Gastroenterology;  Laterality: N/A;  . EYE SURGERY  09/2020 and 10/2020  . MASTECTOMY PARTIAL / LUMPECTOMY    . THYROIDECTOMY, PARTIAL      Family History  Problem Relation Age of Onset  . Breast cancer Neg Hx     Social History   Socioeconomic History  . Marital status: Widowed    Spouse name: Not on file  . Number of children: Not on file  . Years of education: Not on file  . Highest education level: Not on file  Occupational History  . Not on file  Tobacco Use  . Smoking status: Never Smoker  . Smokeless tobacco: Never Used  Substance and Sexual Activity  . Alcohol use: No  . Drug use: Yes    Comment: pain clinic: tramadol  . Sexual activity: Not on file  Other Topics Concern  . Not on file  Social History Narrative  . Not on file   Social Determinants of Health   Financial Resource Strain: Not on file  Food Insecurity: Not on file  Transportation Needs: Not on file  Physical Activity: Not on file  Stress: Not on file  Social Connections: Not on file  Intimate Partner Violence: Not on file     Current  Outpatient Medications:  .  acetaminophen (TYLENOL) 650 MG CR tablet, Take 2 tablets (1,300 mg total) by mouth 2 (two) times daily as needed for pain., Disp: , Rfl:  .  albuterol (VENTOLIN HFA) 108 (90 Base) MCG/ACT inhaler, Inhale 2 puffs into the lungs every 6 (six) hours as needed for wheezing or shortness of breath., Disp: 8 g, Rfl: 4 .  ALPRAZolam (XANAX) 0.25 MG tablet, Take 1 tablet by mouth daily as needed., Disp: , Rfl:  .  docusate sodium (COLACE) 100 MG capsule, Take 1-2 capsules (100-200 mg total) by mouth at bedtime as needed for moderate constipation., Disp: 60 capsule, Rfl: 5 .  ELIQUIS 2.5 MG TABS tablet, TAKE 1 TABLET BY MOUTH TWICE DAILY, Disp: 60 tablet, Rfl: 6 .  furosemide (LASIX) 20 MG tablet, Take 1 tablet (20 mg total) by mouth daily., Disp: 30 tablet, Rfl: 3 .  losartan (COZAAR) 100 MG tablet, TAKE 1 TABLET BY MOUTH DAILY, Disp: 90 tablet, Rfl: 1 .  metoprolol succinate (TOPROL-XL) 50 MG 24 hr tablet, TAKE 1 TABLET BY MOUTH DAILY, Disp: 30 tablet, Rfl: 6 .  omeprazole (PRILOSEC OTC) 20 MG tablet, Take 1 tablet (20 mg total) by mouth daily., Disp: 30 tablet, Rfl: 0 .  pregabalin (LYRICA) 50 MG capsule, TAKE 1 CAPSULE 3 TIMES DAILY (Patient taking differently: Take 50 mg by mouth 3 (three) times daily.), Disp: 90 capsule, Rfl: 3 .  traMADol (ULTRAM-ER) 100 MG 24 hr tablet, Take 1 tablet (100 mg total) by mouth daily., Disp: 30 tablet, Rfl: 1   Allergies  Allergen Reactions  . Codeine Other (See Comments)    ROS Review of Systems  Constitutional: Positive for appetite change and fatigue. Negative for chills and fever.  Respiratory: Positive for choking and shortness of breath. Negative for chest tightness.   Cardiovascular: Negative for leg swelling.  Gastrointestinal: Negative for blood in stool.  Genitourinary: Negative for difficulty urinating.  Musculoskeletal: Positive for arthralgias and gait problem.  Neurological: Negative for dizziness.   Psychiatric/Behavioral: Positive for decreased concentration. Negative for agitation.      Objective:    Physical Exam HENT:     Nose: Nose normal.     Mouth/Throat:     Mouth: Mucous membranes are moist.  Eyes:     Pupils: Pupils are equal, round, and reactive to light.  Cardiovascular:     Rate and Rhythm: Normal rate.     Pulses: Normal pulses.  Pulmonary:     Effort: No respiratory distress.     Breath sounds: No wheezing or rhonchi.  Abdominal:     General: There is no distension.     Tenderness: There is no abdominal tenderness. There is no guarding.  Skin:    Coloration: Skin is not jaundiced.  Neurological:     General: No focal deficit present.     Mental Status: She is alert.  Psychiatric:     Comments: Patient is nervous panicky during conversation keep her eyes closed, appears that she is scared, will try to lace her in skilled nursing facility as she has been in the hospital twice.  She is not in a position to discuss DNR at the present time     BP (!) 185/81   Pulse (!) 55   Ht 5' (1.524 m)   Wt 135 lb 11.2 oz (61.6 kg)   SpO2 98%   BMI 26.50 kg/m  Wt Readings from Last 3 Encounters:  12/05/20 135 lb 11.2 oz (61.6 kg)  12/02/20 134 lb 7.7 oz (61 kg)  11/04/20 141 lb (64 kg)     Health Maintenance Due  Topic Date Due  . COVID-19 Vaccine (1) Never done  . PNA vac Low Risk Adult (2 of 2 - PCV13) 05/10/2016    There are no preventive care reminders to display for this patient.  Lab Results  Component Value Date   TSH 1.84 05/23/2013   Lab Results  Component Value Date   WBC 7.5 12/02/2020   HGB 11.6 (L) 12/02/2020   HCT 36.2 12/02/2020   MCV 90.5 12/02/2020   PLT 244 12/02/2020   Lab Results  Component Value Date   NA 141 12/04/2020   K 4.2 12/04/2020   CO2 30 12/04/2020   GLUCOSE 90 12/04/2020   BUN 49 (H) 12/04/2020   CREATININE 1.41 (H) 12/04/2020   BILITOT 0.8 12/01/2020   ALKPHOS 92 12/01/2020   AST 35 12/01/2020   ALT 41  12/01/2020   PROT 6.0 (L) 12/01/2020   ALBUMIN 3.7 12/01/2020   CALCIUM 8.5 (L) 12/04/2020   ANIONGAP 8 12/04/2020   Lab Results  Component Value Date   CHOL 214 (H) 04/11/2013   Lab Results  Component Value Date   HDL 65 (H) 04/11/2013   Lab Results  Component Value Date   LDLCALC 131 (H) 04/11/2013   Lab Results  Component Value Date   TRIG 89 04/11/2013   No results found for: CHOLHDL No results found for: HGBA1C    Assessment & Plan:   Problem List Items Addressed This Visit      Cardiovascular and Mediastinum   Sick sinus syndrome due to SA node dysfunction ([place]) - Primary    She complains of palpitation intermittently.  She has run of atrial fibrillation.  So I advised her to continue using her beta-blocker.  And Eliquis.      Essential hypertension    Blood pressure is elevated on today's visit        Digestive   Dysphagia    Recent endoscopy revealed hiatal hernia.        Other   Chronic pain syndrome    Patient has extensive work-up in the hospital with CT scan, she has seen orthopedic surgeon and pain specialist.  No treatable cause of her arthritic symptoms is available at present time, decimating patient very nervous and she thinks she cannot live with this kind of pain.  We will try to put her on the skilled nursing         No orders of the defined types were placed in this encounter.   Follow-up: No follow-ups on file.    Cletis Athens, MD

## 2020-12-06 DIAGNOSIS — K219 Gastro-esophageal reflux disease without esophagitis: Secondary | ICD-10-CM | POA: Insufficient documentation

## 2020-12-06 DIAGNOSIS — I5032 Chronic diastolic (congestive) heart failure: Secondary | ICD-10-CM | POA: Insufficient documentation

## 2020-12-06 DIAGNOSIS — I4891 Unspecified atrial fibrillation: Secondary | ICD-10-CM | POA: Insufficient documentation

## 2020-12-08 NOTE — Anesthesia Postprocedure Evaluation (Signed)
Anesthesia Post Note  Patient: Sue Knapp  Procedure(s) Performed: ESOPHAGOGASTRODUODENOSCOPY (EGD) WITH PROPOFOL (N/A )  Patient location during evaluation: PACU Anesthesia Type: General Level of consciousness: awake and alert Pain management: pain level controlled Vital Signs Assessment: post-procedure vital signs reviewed and stable Respiratory status: spontaneous breathing, nonlabored ventilation, respiratory function stable and patient connected to nasal cannula oxygen Cardiovascular status: blood pressure returned to baseline and stable Postop Assessment: no apparent nausea or vomiting Anesthetic complications: no   No complications documented.   Last Vitals:  Vitals:   12/04/20 1105 12/04/20 1325  BP: (!) 152/79   Pulse: 62   Resp: 18   Temp: (!) 36.4 C   SpO2: 92% 97%    Last Pain:  Vitals:   12/04/20 1105  TempSrc: Oral  PainSc: 0-No pain                 Molli Barrows

## 2020-12-09 LAB — CELIAC DISEASE PANEL
Endomysial Ab, IgA: NEGATIVE
IgA: 195 mg/dL (ref 64–422)
Tissue Transglutaminase Ab, IgA: <2 U/mL (ref 0–3)

## 2020-12-12 ENCOUNTER — Encounter: Payer: Self-pay | Admitting: Gastroenterology

## 2020-12-12 ENCOUNTER — Other Ambulatory Visit: Payer: Self-pay

## 2020-12-12 ENCOUNTER — Ambulatory Visit (INDEPENDENT_AMBULATORY_CARE_PROVIDER_SITE_OTHER): Payer: Medicare Other | Admitting: Gastroenterology

## 2020-12-12 VITALS — BP 136/70 | HR 60

## 2020-12-12 DIAGNOSIS — K5900 Constipation, unspecified: Secondary | ICD-10-CM | POA: Diagnosis not present

## 2020-12-12 DIAGNOSIS — R1310 Dysphagia, unspecified: Secondary | ICD-10-CM | POA: Diagnosis not present

## 2020-12-12 MED ORDER — PSYLLIUM 58.6 % PO PACK: 1.0000 | PACK | Freq: Every day | ORAL | 2 refills | Status: DC

## 2020-12-12 NOTE — Progress Notes (Signed)
Sue Knapp [address]  [place]  [place], [place] [address]  Main: [phone]  Fax: [phone]   Gastroenterology Consultation  Referring Provider:     Cletis Athens, MD Primary Care Physician:  Cletis Athens, MD Reason for Consultation:     Dysphagia        HPI:    Chief complaint: Dysphagia  Sue Knapp is a 87 y.o. y/o female referred for consultation & management  by Dr. Cletis Athens, MD.  Patient recently seen by Dr. Marius Ditch and underwent EGD during hospitalization for dysphagia that did not show any obstructive lesions.  Small hiatal hernia was seen.  Patient describes having feeling of food sitting in the epigastric region intermittently, and she does not feel better until she burps.  No episodes of food impaction.  No nausea or vomiting.  She feels that recently pills seem to regurgitate after she swallows them and this did not used to happen before.  No weight loss.  Does report having constipation about 3 weeks ago that is starting to get better.  Does report increased gas since then 2.  No blood in stool.  Past Medical History:  Diagnosis Date  . Arthritis   . Breast cancer ([place]) 2013   left breast, radiation and lumpectomy  . Cataract   . Fatigue   . GERD (gastroesophageal reflux disease)   . Glaucoma   . Heart murmur   . Heart palpitations   . Hiatal hernia   . HOH (hard of hearing)    AIDS  . Hyperlipidemia   . Hypertension   . Hypothyroidism   . Left ankle injury    Old left ankle injury  . Left bundle branch block   . Neuropathy   . Osteoporosis   . Pacemaker   . Palpitations   . Personal history of radiation therapy 2013   left breast ca  . Scoliosis   . Shortness of breath dyspnea   . Thyroid nodule   . TIA (transient ischemic attack)     Past Surgical History:  Procedure Laterality Date  . ABDOMINAL HYSTERECTOMY    . BREAST BIOPSY Left 2005   b[place]  . BREAST BIOPSY Left 2013   stereo bx, invasive tubular  carcinoma and DCIS  . 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 BREAST SURGERY     LUMPECTOMY  . CATARACT EXTRACTION W/PHACO Right 03/01/2015   Procedure: CATARACT EXTRACTION PHACO AND INTRAOCULAR LENS PLACEMENT (IOC);  Surgeon: Birder Robson, MD;  Location: [facility] OR[facility];  Service: Ophthalmology;  Laterality: Right;  US:1:03.1 AP:22.8 CDE:14.37  Fluid lot # [id number]  . CATARACT EXTRACTION W/PHACO Left 03/22/2015   Procedure: CATARACT EXTRACTION PHACO AND INTRAOCULAR LENS PLACEMENT (IOC);  Surgeon: Birder Robson, MD;  Location: [facility];  Service: Ophthalmology;  Laterality: Left;  [place]: 01:01.0 AP%: 22.5% CDE: 13.75 Lot # [id number]  . ESOPHAGOGASTRODUODENOSCOPY (EGD) WITH PROPOFOL N/A 12/04/2020   Procedure: ESOPHAGOGASTRODUODENOSCOPY (EGD) WITH PROPOFOL;  Surgeon: Lin Landsman, MD;  Location: [facility];  Service: Gastroenterology;  Laterality: N/A;  . ESOPHAGOGASTRODUODENOSCOPY (EGD) WITH PROPOFOL N/A 12/04/2020   Procedure: ESOPHAGOGASTRODUODENOSCOPY (EGD) WITH PROPOFOL;  Surgeon: Lin Landsman, MD;  Location: [facility] END[facility]COPY;  Service: Gastroenterology;  Laterality: N/A;  . EYE SURGERY  09/2020 and 10/2020  . MASTECTOMY PARTIAL / LUMPECTOMY    . THYROIDECTOMY, PARTIAL      Prior to Admission medications   Medication Sig Start Date End Date  Taking? Authorizing Provider  acetaminophen (TYLENOL) 650 MG CR tablet Take 2 tablets (1,300 mg total) by mouth 2 (two) times daily as needed for pain. 12/04/20  Yes Jennye Boroughs, MD  albuterol (VENTOLIN HFA) 108 (90 Base) MCG/ACT inhaler Inhale 2 puffs into the lungs every 6 (six) hours as needed for wheezing or shortness of breath. 09/09/20  Yes Beckie Salts, FNP  ALPRAZolam Duanne Moron) 0.25 MG tablet Take 1 tablet by mouth daily as needed. 09/15/19  Yes [provider]  docusate sodium (COLACE) 100 MG capsule Take 1-2 capsules (100-200 mg total) by mouth at bedtime as  needed for moderate constipation. 11/03/20 05/02/21 Yes Lateef, Carlus Pavlov, MD  ELIQUIS 2.5 MG TABS tablet TAKE 1 TABLET BY MOUTH TWICE DAILY 11/30/20  Yes Masoud, Viann Shove, MD  furosemide (LASIX) 20 MG tablet Take 1 tablet (20 mg total) by mouth daily. 10/31/20  Yes Beckie Salts, FNP  losartan (COZAAR) 100 MG tablet TAKE 1 TABLET BY MOUTH DAILY Patient taking differently: 50 mg. 10/27/20  Yes Beckie Salts, FNP  metoprolol succinate (TOPROL-XL) 50 MG 24 hr tablet TAKE 1 TABLET BY MOUTH DAILY 07/11/20  Yes Masoud, Viann Shove, MD  omeprazole (PRILOSEC OTC) 20 MG tablet Take 1 tablet (20 mg total) by mouth daily. 12/04/20 01/03/21 Yes Jennye Boroughs, MD  pregabalin (LYRICA) 50 MG capsule TAKE 1 CAPSULE 3 TIMES DAILY Patient taking differently: Take 50 mg by mouth 3 (three) times daily. 10/27/20  Yes Beckie Salts, FNP  psyllium (METAMUCIL) 58.6 % packet Take 1 packet by mouth daily. 12/12/20  Yes Kaliope Quinonez, Margretta Sidle B, MD  torsemide (DEMADEX) 20 MG tablet Take 20 mg by mouth daily.   Yes [provider]  traMADol (ULTRAM-ER) 100 MG 24 hr tablet Take 1 tablet (100 mg total) by mouth daily. 11/03/20  Yes Gillis Santa, MD    Family History  Problem Relation Age of Onset  . Breast cancer Neg Hx      Social History   Tobacco Use  . Smoking status: Never Smoker  . Smokeless tobacco: Never Used  Substance Use Topics  . Alcohol use: No  . Drug use: Yes    Comment: pain clinic: tramadol    Allergies as of 12/12/2020 - Review Complete 12/12/2020  Allergen Reaction Noted  . Codeine Other (See Comments) 01/25/2015    Review of Systems:    All systems reviewed and negative except where noted in HPI.   Physical Exam:  BP 136/70   Pulse 60  No LMP recorded. Patient has had a hysterectomy. Psych:  Alert and cooperative. Normal mood and affect. General:   Alert,  Well-developed, well-nourished, pleasant and cooperative in NAD Head:  Normocephalic and atraumatic. Eyes:  Sclera clear, no icterus.    Conjunctiva pink. Ears:  Normal auditory acuity. Nose:  No deformity, discharge, or lesions. Mouth:  No deformity or lesions,oropharynx pink & moist. Neck:  Supple; no masses or thyromegaly. Abdomen:  Normal bowel sounds.  No bruits.  Soft, non-tender and non-distended without masses, hepatosplenomegaly or hernias noted.  No guarding or rebound tenderness.    Msk:  Symmetrical without gross deformities. Good, equal movement & strength bilaterally. Pulses:  Normal pulses noted. Extremities:  No clubbing or edema.  No cyanosis. Neurologic:  Alert and oriented x3;  grossly normal neurologically. Skin:  Intact without significant lesions or rashes. No jaundice. Lymph Nodes:  No significant cervical adenopathy. Psych:  Alert and cooperative. Normal mood and affect.   Labs: CBC    Component Value Date/Time   WBC  7.5 12/02/2020 0408   RBC 4.00 12/02/2020 0408   HGB 11.6 (L) 12/02/2020 0408   HGB 11.4 (L) 06/09/2013 0925   HCT 36.2 12/02/2020 0408   HCT 33.7 (L) 06/09/2013 0925   PLT 244 12/02/2020 0408   PLT 406 06/09/2013 0925   MCV 90.5 12/02/2020 0408   MCV 87 06/09/2013 0925   MCH 29.0 12/02/2020 0408   MCHC 32.0 12/02/2020 0408   RDW 16.6 (H) 12/02/2020 0408   RDW 14.9 (H) 06/09/2013 0925   LYMPHSABS 663 (L) 11/28/2020 1128   LYMPHSABS 0.7 (L) 06/09/2013 0925   MONOABS 0.5 10/25/2020 1243   MONOABS 0.6 06/09/2013 0925   EOSABS 8 (L) 11/28/2020 1128   EOSABS 0.2 06/09/2013 0925   BASOSABS 39 11/28/2020 1128   BASOSABS 0.1 06/09/2013 0925   CMP     Component Value Date/Time   NA 141 12/04/2020 0355   NA 138 06/09/2013 0925   K 4.2 12/04/2020 0355   K 3.9 06/09/2013 0925   CL 103 12/04/2020 0355   CL 105 06/09/2013 0925   CO2 30 12/04/2020 0355   CO2 28 06/09/2013 0925   GLUCOSE 90 12/04/2020 0355   GLUCOSE 135 (H) 06/09/2013 0925   BUN 49 (H) 12/04/2020 0355   BUN 23 (H) 06/09/2013 0925   CREATININE 1.41 (H) 12/04/2020 0355   CREATININE 1.39 (H) 11/28/2020 1128    CALCIUM 8.5 (L) 12/04/2020 0355   CALCIUM 9.0 06/09/2013 0925   PROT 6.0 (L) 12/01/2020 1508   PROT 5.5 (L) 05/28/2013 0452   ALBUMIN 3.7 12/01/2020 1508   ALBUMIN 2.6 (L) 05/28/2013 0452   AST 35 12/01/2020 1508   AST 30 05/28/2013 0452   ALT 41 12/01/2020 1508   ALT 13 05/28/2013 0452   ALKPHOS 92 12/01/2020 1508   ALKPHOS 67 05/28/2013 0452   BILITOT 0.8 12/01/2020 1508   BILITOT 0.3 05/28/2013 0452   GFRNONAA 34 (L) 12/04/2020 0355   GFRNONAA 32 (L) 11/28/2020 1128   GFRAA 37 (L) 11/28/2020 1128    Imaging Studies: DG Chest 2 View  Result Date: 12/01/2020 CLINICAL DATA:  Shortness of breath EXAM: CHEST - 2 VIEW COMPARISON:  10/25/2020 FINDINGS: Left-sided pacing device as before. Cardiomegaly with small bilateral pleural effusions. Aortic atherosclerosis. No consolidation or pneumothorax. IMPRESSION: Cardiomegaly with small pleural effusions. Electronically Signed   By: Jasmine Pang M.D.   On: 12/01/2020 16:14   ECHOCARDIOGRAM COMPLETE  Result Date: 12/02/2020    ECHOCARDIOGRAM REPORT   Patient Name:   Sue Knapp Date of Exam: 12/02/2020 Medical Rec #:  [id number]         Height:       60.0 in Accession #:    [id number]        Weight:       134.5 lb Date of Birth:  Dec 19, 1922         BSA:          1.577 m Patient Age:    89 years          BP:           170/89 mmHg Patient Gender: F                 HR:           59 bpm. Exam Location:  [facility] Procedure: Cardiac Doppler and Color Doppler Indications:     CHF-acute diastolic I50.31  History:         Patient has no prior history  of Echocardiogram examinations.                  TIA; Risk Factors:Hypertension. LBBB.  Sonographer:     Sherrie Sport RDCS (AE) Referring Phys:  [id number] [facility] [place] Diagnosing Phys: Serafina Royals MD IMPRESSIONS  1. Left ventricular ejection fraction, by estimation, is 55 to 60%. The left ventricle has normal function. The left ventricle has no regional wall motion abnormalities. Left ventricular diastolic  parameters were normal.  2. Right ventricular systolic function is normal. The right ventricular size is normal.  3. Left atrial size was moderately dilated.  4. Right atrial size was mildly dilated.  5. The mitral valve is normal in structure. Moderate mitral valve regurgitation.  6. The aortic valve is normal in structure. Aortic valve regurgitation is mild. FINDINGS  Left Ventricle: Left ventricular ejection fraction, by estimation, is 55 to 60%. The left ventricle has normal function. The left ventricle has no regional wall motion abnormalities. The left ventricular internal cavity size was normal in size. There is  no left ventricular hypertrophy. Left ventricular diastolic parameters were normal. Right Ventricle: The right ventricular size is normal. No increase in right ventricular wall thickness. Right ventricular systolic function is normal. Left Atrium: Left atrial size was moderately dilated. Right Atrium: Right atrial size was mildly dilated. Pericardium: There is no evidence of pericardial effusion. Mitral Valve: The mitral valve is normal in structure. Moderate mitral valve regurgitation. Tricuspid Valve: The tricuspid valve is normal in structure. Tricuspid valve regurgitation is mild. Aortic Valve: The aortic valve is normal in structure. Aortic valve regurgitation is mild. Aortic valve mean gradient measures 3.0 mmHg. Aortic valve peak gradient measures 4.4 mmHg. Aortic valve area, by VTI measures 2.89 cm. Pulmonic Valve: The pulmonic valve was normal in structure. Pulmonic valve regurgitation is not visualized. Aorta: The aortic root and ascending aorta are structurally normal, with no evidence of dilitation. IAS/Shunts: No atrial level shunt detected by color flow Doppler.  LEFT VENTRICLE PLAX 2D LVIDd:         3.36 cm LVIDs:         2.14 cm LV PW:         1.20 cm LV IVS:        0.94 cm LVOT diam:     2.10 cm LV SV:         67 LV SV Index:   42 LVOT Area:     3.46 cm  RIGHT VENTRICLE RV Basal  diam:  2.60 cm RV S prime:     12.00 cm/s TAPSE (M-mode): 3.1 cm LEFT ATRIUM              Index        RIGHT ATRIUM           Index LA diam:        3.90 cm  2.47 cm/m   RA Area:     21.10 cm LA Vol (A2C):   172.0 ml 109.08 ml/m RA Volume:   56.10 ml  35.58 ml/m LA Vol (A4C):   78.9 ml  50.04 ml/m LA Biplane Vol: 118.0 ml 74.83 ml/m  AORTIC VALVE AV Area (Vmax):    3.03 cm AV Area (Vmean):   2.57 cm AV Area (VTI):     2.89 cm AV Vmax:           105.00 cm/s AV Vmean:          78.800 cm/s AV VTI:  0.231 m AV Peak Grad:      4.4 mmHg AV Mean Grad:      3.0 mmHg LVOT Vmax:         92.00 cm/s LVOT Vmean:        58.500 cm/s LVOT VTI:          0.193 m LVOT/AV VTI ratio: 0.84  AORTA Ao Root diam: 3.15 cm MITRAL VALVE               TRICUSPID VALVE MV Area (PHT): 3.70 cm    TR Peak grad:   43.8 mmHg MV Decel Time: 205 msec    TR Vmax:        331.00 cm/s MV E velocity: 97.30 cm/s                            SHUNTS                            Systemic VTI:  0.19 m                            Systemic Diam: 2.10 cm Serafina Royals MD Electronically signed by Serafina Royals MD Signature Date/Time: 12/02/2020/3:10:29 PM    Final     Assessment and Plan:   Sue Knapp is a 87 y.o. y/o female has been referred for dysphagia  EGD completed and did not show any obstructive lesions Her clinical symptoms are more suggestive of symptoms from her hiatal hernia and given that she feels food is sitting in her epigastric region but feels better after she burps.  However, at her age, she may have component of oropharyngeal dysphagia, will start with swallow study for further evaluation  Typically symptomatic hiatal hernias can be referred to surgery for further evaluation, but given that it is small and given her advanced age and possible surgical risks, will first try symptomatic treatment and if symptoms continue, can consider surgery referral.  Patient would like to avoid surgery if possible as well and  would like to continue with conservative management at this time.  She is already on PPI which she states has helped her symptoms somewhat, but since it was recently started she cannot say yet for sure.  Patient is on low-dose PPI at this time, continue and then increase if needed after at least 4 weeks of therapy  (Risks of PPI use were discussed with patient including bone loss, C. Diff diarrhea, pneumonia, infections, CKD, electrolyte abnormalities.  Pt. Verbalizes understanding and chooses to continue the medication.)  Patient is having some gas and bloating which is likely due to her underlying constipation that started about 3 weeks ago.  Metamucil daily recommended at this time  No alarm symptoms present at this time  Patient educated extensively on acid reflux lifestyle modification, including buying a bed wedge, not eating 3 hrs before bedtime, diet modifications, and handout given for the same.   (Risks of PPI use were discussed with patient including bone loss, C. Diff diarrhea, pneumonia, infections, CKD, electrolyte abnormalities.  Pt. Verbalizes understanding and chooses to continue the medication.)    Dr Sue Knapp  Speech recognition software was used to dictate the above note.

## 2020-12-13 ENCOUNTER — Telehealth: Payer: Self-pay | Admitting: Gastroenterology

## 2020-12-13 ENCOUNTER — Telehealth: Payer: Self-pay

## 2020-12-13 ENCOUNTER — Other Ambulatory Visit
Admission: RE | Admit: 2020-12-13 | Discharge: 2020-12-13 | Disposition: A | Discharge status: Home / Self Care | Payer: Medicare Other | Source: Ambulatory Visit | Attending: Internal Medicine | Admitting: Internal Medicine

## 2020-12-13 DIAGNOSIS — I4891 Unspecified atrial fibrillation: Secondary | ICD-10-CM | POA: Insufficient documentation

## 2020-12-13 DIAGNOSIS — I509 Heart failure, unspecified: Secondary | ICD-10-CM | POA: Insufficient documentation

## 2020-12-13 LAB — COMPREHENSIVE METABOLIC PANEL
ALT: 27 U/L (ref 0–44)
AST: 32 U/L (ref 15–41)
Albumin: 3.8 g/dL (ref 3.5–5.0)
Alkaline Phosphatase: 71 U/L (ref 38–126)
Anion gap: 14 (ref 5–15)
BUN: 40 mg/dL — ABNORMAL HIGH (ref 8–23)
CO2: 27 mmol/L (ref 22–32)
Calcium: 9.1 mg/dL (ref 8.9–10.3)
Chloride: 101 mmol/L (ref 98–111)
Creatinine, Ser: 1.26 mg/dL — ABNORMAL HIGH (ref 0.44–1.00)
GFR, Estimated: 39 mL/min — ABNORMAL LOW (ref >60)
Glucose, Bld: 111 mg/dL — ABNORMAL HIGH (ref 70–99)
Potassium: 3.6 mmol/L (ref 3.5–5.1)
Sodium: 142 mmol/L (ref 135–145)
Total Bilirubin: 1.1 mg/dL (ref 0.3–1.2)
Total Protein: 6.3 g/dL — ABNORMAL LOW (ref 6.5–8.1)

## 2020-12-13 LAB — CBC WITH DIFFERENTIAL/PLATELET
Abs Immature Granulocytes: 0.04 10*3/uL (ref 0.00–0.07)
Basophils Absolute: 0.1 10*3/uL (ref 0.0–0.1)
Basophils Relative: 1 %
Eosinophils Absolute: 0.2 10*3/uL (ref 0.0–0.5)
Eosinophils Relative: 2 %
HCT: 38.1 % (ref 36.0–46.0)
Hemoglobin: 12.6 g/dL (ref 12.0–15.0)
Immature Granulocytes: 1 %
Lymphocytes Relative: 19 %
Lymphs Abs: 1.4 10*3/uL (ref 0.7–4.0)
MCH: 29.6 pg (ref 26.0–34.0)
MCHC: 33.1 g/dL (ref 30.0–36.0)
MCV: 89.4 fL (ref 80.0–100.0)
Monocytes Absolute: 0.5 10*3/uL (ref 0.1–1.0)
Monocytes Relative: 7 %
Neutro Abs: 5.2 10*3/uL (ref 1.7–7.7)
Neutrophils Relative %: 70 %
Platelets: 263 10*3/uL (ref 150–400)
RBC: 4.26 MIL/uL (ref 3.87–5.11)
RDW: 16.4 % — ABNORMAL HIGH (ref 11.5–15.5)
WBC: 7.4 10*3/uL (ref 4.0–10.5)
nRBC: 0 % (ref 0.0–0.2)

## 2020-12-13 NOTE — Telephone Encounter (Signed)
Left message on voicemail  Barium swallow scheduled for Wed June 1 at 9 am-- nothing to eat or drink after 12 am 12/20/20

## 2020-12-13 NOTE — Addendum Note (Signed)
Addended by: Lurlean Nanny on: 12/13/2020 11:16 AM   Modules accepted: Orders

## 2020-12-13 NOTE — Telephone Encounter (Signed)
We had ordered a modified barium swallow for this patient. However, speech pathologist Belenda Cruise does not recommend this study at this study at this time as she states they are not concerned about any aspiration events in this patient and rather regurgitation. She recommends an esophagram instead. The Upper endoscopy did not show any obstructive lesions. No schatzki rings were reported. Esophagram would help confirm the absence of any concerning lesions or sometimes motility issues such as achalasia (birds beak appearance), or schatzki ring that may not be seen on upper endoscopy can be assessed via esophagram. Belenda Cruise also that at patient's age going through diet and pinpointing some foods to avoid might help this pt and patient would need to follow up with speech and swallow for this in the future as well  We will thus change the order to esophagram  No indication for repeat upper endoscopy at this time.

## 2020-12-14 ENCOUNTER — Ambulatory Visit: Payer: Medicare Other | Admitting: Internal Medicine

## 2020-12-15 ENCOUNTER — Telehealth: Payer: Self-pay | Admitting: Family

## 2020-12-15 ENCOUNTER — Ambulatory Visit: Payer: Medicare Other | Admitting: Family

## 2020-12-15 NOTE — Telephone Encounter (Signed)
Patient did not show for her [facility] appointment on 12/15/20. Will attempt to reschedule.

## 2020-12-20 NOTE — Telephone Encounter (Signed)
Spoke to [place] and she is aware of where to go for barium swallow [place]

## 2020-12-21 ENCOUNTER — Ambulatory Visit
Admission: RE | Admit: 2020-12-21 | Discharge: 2020-12-21 | Disposition: A | Discharge status: Home / Self Care | Payer: Medicare Other | Source: Ambulatory Visit | Attending: Gastroenterology | Admitting: Gastroenterology

## 2020-12-21 ENCOUNTER — Other Ambulatory Visit: Payer: Self-pay | Admitting: Gastroenterology

## 2020-12-21 DIAGNOSIS — R1310 Dysphagia, unspecified: Secondary | ICD-10-CM | POA: Insufficient documentation

## 2020-12-26 ENCOUNTER — Encounter: Payer: Medicare Other | Admitting: Student in an Organized Health Care Education/Training Program

## 2020-12-27 ENCOUNTER — Ambulatory Visit (INDEPENDENT_AMBULATORY_CARE_PROVIDER_SITE_OTHER): Payer: Medicare Other | Admitting: Internal Medicine

## 2020-12-27 ENCOUNTER — Encounter: Payer: Self-pay | Admitting: Internal Medicine

## 2020-12-27 ENCOUNTER — Other Ambulatory Visit: Payer: Self-pay

## 2020-12-27 VITALS — BP 146/74 | HR 69 | Ht 60.0 in | Wt 121.8 lb

## 2020-12-27 DIAGNOSIS — M1711 Unilateral primary osteoarthritis, right knee: Secondary | ICD-10-CM | POA: Diagnosis not present

## 2020-12-27 DIAGNOSIS — I495 Sick sinus syndrome: Secondary | ICD-10-CM

## 2020-12-27 DIAGNOSIS — I10 Essential (primary) hypertension: Secondary | ICD-10-CM | POA: Diagnosis not present

## 2020-12-27 NOTE — Progress Notes (Signed)
Established Patient Office Visit  Subjective:  Patient ID: Sue Knapp, female    DOB: Nov 29, 1922  Age: 87 y.o. MRN: [id number]  CC:  Chief Complaint  Patient presents with  . Follow-up    HPI  Sue Knapp presents for regular checkup from a nursing home.  Patient has multiple joint arthritis and a history of left breast cancer involving she has a reflux problem and multiple joint osteoarthritis  Past Medical History:  Diagnosis Date  . Arthritis   . Breast cancer ([place]) 2013   left breast, radiation and lumpectomy  . Cataract   . Fatigue   . GERD (gastroesophageal reflux disease)   . Glaucoma   . Heart murmur   . Heart palpitations   . Hiatal hernia   . HOH (hard of hearing)    AIDS  . Hyperlipidemia   . Hypertension   . Hypothyroidism   . Left ankle injury    Old left ankle injury  . Left bundle branch block   . Neuropathy   . Osteoporosis   . Pacemaker   . Palpitations   . Personal history of radiation therapy 2013   left breast ca  . Scoliosis   . Shortness of breath dyspnea   . Thyroid nodule   . TIA (transient ischemic attack)     Past Surgical History:  Procedure Laterality Date  . ABDOMINAL HYSTERECTOMY    . BREAST BIOPSY Left 2005   bengin  . BREAST BIOPSY Left 2013   stereo bx, invasive tubular carcinoma and DCIS  . 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 BREAST SURGERY     LUMPECTOMY  . CATARACT EXTRACTION W/PHACO Right 03/01/2015   Procedure: CATARACT EXTRACTION PHACO AND INTRAOCULAR LENS PLACEMENT (IOC);  Surgeon: Birder Robson, MD;  Location: [facility];  Service: Ophthalmology;  Laterality: Right;  US:1:03.1 AP:22.8 CDE:14.37  Fluid lot # [id number]  . CATARACT EXTRACTION W/PHACO Left 03/22/2015   Procedure: CATARACT EXTRACTION PHACO AND INTRAOCULAR LENS PLACEMENT (IOC);  Surgeon: Birder Robson, MD;  Location: [facility];  Service: Ophthalmology;  Laterality: Left;   [place]: 01:01.0 AP%: 22.5% CDE: 13.75 Lot # [id number]  . ESOPHAGOGASTRODUODENOSCOPY (EGD) WITH PROPOFOL N/A 12/04/2020   Procedure: ESOPHAGOGASTRODUODENOSCOPY (EGD) WITH PROPOFOL;  Surgeon: Lin Landsman, MD;  Location: [place];  Service: Gastroenterology;  Laterality: N/A;  . ESOPHAGOGASTRODUODENOSCOPY (EGD) WITH PROPOFOL N/A 12/04/2020   Procedure: ESOPHAGOGASTRODUODENOSCOPY (EGD) WITH PROPOFOL;  Surgeon: Lin Landsman, MD;  Location: [facility];  Service: Gastroenterology;  Laterality: N/A;  . EYE SURGERY  09/2020 and 10/2020  . MASTECTOMY PARTIAL / LUMPECTOMY    . THYROIDECTOMY, PARTIAL      Family History  Problem Relation Age of Onset  . Breast cancer Neg Hx     Social History   Socioeconomic History  . Marital status: Widowed    Spouse name: Not on file  . Number of children: Not on file  . Years of education: Not on file  . Highest education level: Not on file  Occupational History  . Not on file  Tobacco Use  . Smoking status: Never Smoker  . Smokeless tobacco: Never Used  Substance and Sexual Activity  . Alcohol use: No  . Drug use: Yes    Comment: pain clinic: tramadol  . Sexual activity: Not on file  Other Topics Concern  . Not on file  Social History Narrative  . Not on file   Social  Determinants of Health   Financial Resource Strain: Not on file  Food Insecurity: Not on file  Transportation Needs: Not on file  Physical Activity: Not on file  Stress: Not on file  Social Connections: Not on file  Intimate Partner Violence: Not on file     Current Outpatient Medications:  .  acetaminophen (TYLENOL) 650 MG CR tablet, Take 2 tablets (1,300 mg total) by mouth 2 (two) times daily as needed for pain., Disp: , Rfl:  .  albuterol (VENTOLIN HFA) 108 (90 Base) MCG/ACT inhaler, Inhale 2 puffs into the lungs every 6 (six) hours as needed for wheezing or shortness of breath., Disp: 8 g, Rfl: 4 .  ALPRAZolam (XANAX) 0.25 MG tablet, Take 1 tablet by  mouth daily as needed., Disp: , Rfl:  .  docusate sodium (COLACE) 100 MG capsule, Take 1-2 capsules (100-200 mg total) by mouth at bedtime as needed for moderate constipation., Disp: 60 capsule, Rfl: 5 .  ELIQUIS 2.5 MG TABS tablet, TAKE 1 TABLET BY MOUTH TWICE DAILY, Disp: 60 tablet, Rfl: 6 .  furosemide (LASIX) 20 MG tablet, Take 1 tablet (20 mg total) by mouth daily., Disp: 30 tablet, Rfl: 3 .  losartan (COZAAR) 100 MG tablet, TAKE 1 TABLET BY MOUTH DAILY (Patient taking differently: 50 mg.), Disp: 90 tablet, Rfl: 1 .  metoprolol succinate (TOPROL-XL) 50 MG 24 hr tablet, TAKE 1 TABLET BY MOUTH DAILY, Disp: 30 tablet, Rfl: 6 .  omeprazole (PRILOSEC OTC) 20 MG tablet, Take 1 tablet (20 mg total) by mouth daily., Disp: 30 tablet, Rfl: 0 .  pregabalin (LYRICA) 50 MG capsule, TAKE 1 CAPSULE 3 TIMES DAILY (Patient taking differently: Take 50 mg by mouth 3 (three) times daily.), Disp: 90 capsule, Rfl: 3 .  psyllium (METAMUCIL) 58.6 % packet, Take 1 packet by mouth daily., Disp: 30 each, Rfl: 2 .  torsemide (DEMADEX) 20 MG tablet, Take 20 mg by mouth daily., Disp: , Rfl:  .  traMADol (ULTRAM-ER) 100 MG 24 hr tablet, Take 1 tablet (100 mg total) by mouth daily., Disp: 30 tablet, Rfl: 1   Allergies  Allergen Reactions  . Codeine Other (See Comments)    ROS Review of Systems  Constitutional: Negative.   HENT: Negative.   Eyes: Negative.   Respiratory: Negative.   Cardiovascular: Negative.   Gastrointestinal: Negative.   Endocrine: Negative.   Genitourinary: Negative.   Musculoskeletal: Negative.   Skin: Negative.   Allergic/Immunologic: Negative.   Neurological: Negative.   Hematological: Negative.   Psychiatric/Behavioral: Negative.   All other systems reviewed and are negative.     Objective:    Physical Exam Vitals reviewed.  Constitutional:      Appearance: Normal appearance.  HENT:     Mouth/Throat:     Mouth: Mucous membranes are moist.  Eyes:     Pupils: Pupils are  equal, round, and reactive to light.  Neck:     Vascular: No carotid bruit.  Cardiovascular:     Rate and Rhythm: Normal rate and regular rhythm.     Pulses: Normal pulses.     Heart sounds: Normal heart sounds.  Pulmonary:     Effort: Pulmonary effort is normal.     Breath sounds: Normal breath sounds.  Abdominal:     General: Bowel sounds are normal.     Palpations: Abdomen is soft. There is no hepatomegaly, splenomegaly or mass.     Tenderness: There is no abdominal tenderness.     Hernia: No hernia is present.  Musculoskeletal:        General: No tenderness.     Cervical back: Neck supple.     Right lower leg: No edema.     Left lower leg: No edema.  Skin:    Findings: No rash.  Neurological:     Mental Status: She is alert and oriented to person, place, and time.     Motor: No weakness.  Psychiatric:        Mood and Affect: Mood and affect normal.        Behavior: Behavior normal.     BP (!) 146/74   Pulse 69   Ht 5' (1.524 m)   Wt 121 lb 12.8 oz (55.2 kg)   BMI 23.79 kg/m  Wt Readings from Last 3 Encounters:  12/27/20 121 lb 12.8 oz (55.2 kg)  12/05/20 135 lb 11.2 oz (61.6 kg)  12/02/20 134 lb 7.7 oz (61 kg)     Health Maintenance Due  Topic Date Due  . Pneumococcal Vaccine 56-70 Years old (1 of 4 - PCV13) Never done  . COVID-19 Vaccine (1) Never done  . Zoster Vaccines- Shingrix (1 of 2) Never done  . PNA vac Low Risk Adult (2 of 2 - PCV13) 05/10/2016    There are no preventive care reminders to display for this patient.  Lab Results  Component Value Date   TSH 1.84 05/23/2013   Lab Results  Component Value Date   WBC 7.4 12/13/2020   HGB 12.6 12/13/2020   HCT 38.1 12/13/2020   MCV 89.4 12/13/2020   PLT 263 12/13/2020   Lab Results  Component Value Date   NA 142 12/13/2020   K 3.6 12/13/2020   CO2 27 12/13/2020   GLUCOSE 111 (H) 12/13/2020   BUN 40 (H) 12/13/2020   CREATININE 1.26 (H) 12/13/2020   BILITOT 1.1 12/13/2020   ALKPHOS 71  12/13/2020   AST 32 12/13/2020   ALT 27 12/13/2020   PROT 6.3 (L) 12/13/2020   ALBUMIN 3.8 12/13/2020   CALCIUM 9.1 12/13/2020   ANIONGAP 14 12/13/2020   Lab Results  Component Value Date   CHOL 214 (H) 04/11/2013   Lab Results  Component Value Date   HDL 65 (H) 04/11/2013   Lab Results  Component Value Date   LDLCALC 131 (H) 04/11/2013   Lab Results  Component Value Date   TRIG 89 04/11/2013   No results found for: CHOLHDL No results found for: HGBA1C    Assessment & Plan:   Problem List Items Addressed This Visit      Cardiovascular and Mediastinum   Sick sinus syndrome due to SA node dysfunction ([place]) - Primary    Pacemaker is working well, regularly come for pacemaker check      Essential hypertension    Blood pressure stable on present medication        Musculoskeletal and Integument   Primary osteoarthritis of right knee    Chronic problem refer to orthopedics and pain specialist       Patient is in nursing home setting.  She and her family was advised that they should continue all her medication under the direction of Dr. Ouida Sills  No orders of the defined types were placed in this encounter.   Follow-up: No follow-ups on file.    Cletis Athens, MD

## 2020-12-27 NOTE — Assessment & Plan Note (Signed)
Chronic problem refer to orthopedics and pain specialist

## 2020-12-27 NOTE — Assessment & Plan Note (Signed)
Blood pressure stable on present medication. 

## 2020-12-27 NOTE — Assessment & Plan Note (Signed)
Pacemaker is working well, regularly come for pacemaker check

## 2021-01-17 ENCOUNTER — Encounter: Payer: Medicare Other | Admitting: Student in an Organized Health Care Education/Training Program

## 2021-01-19 ENCOUNTER — Ambulatory Visit: Payer: Medicare Other | Admitting: Gastroenterology

## 2021-01-25 DIAGNOSIS — F411 Generalized anxiety disorder: Secondary | ICD-10-CM | POA: Insufficient documentation

## 2021-02-06 ENCOUNTER — Ambulatory Visit: Payer: Medicare Other | Admitting: Gastroenterology

## 2021-02-06 ENCOUNTER — Encounter: Payer: Self-pay | Admitting: *Deleted

## 2021-02-17 ENCOUNTER — Ambulatory Visit: Payer: Medicare Other | Attending: Internal Medicine

## 2021-02-17 ENCOUNTER — Other Ambulatory Visit: Payer: Self-pay

## 2021-02-17 DIAGNOSIS — Z23 Encounter for immunization: Secondary | ICD-10-CM

## 2021-02-17 MED ORDER — PFIZER-BIONT COVID-19 VAC-TRIS 30 MCG/0.3ML IM SUSP
INTRAMUSCULAR | 0 refills | Status: DC
  Filled 2021-02-17: qty 0.3, 1d supply, fill #0

## 2021-02-17 NOTE — Progress Notes (Signed)
   Covid-19 Vaccination Clinic  Name:  Sue Knapp    MRN: [id number] DOB: [birth year]  02/17/2021  [name] was observed post Covid-19 immunization for 15 minutes without incident. She was provided with Vaccine Information Sheet and instruction to access the V-Safe system.   [name] was instructed to call 911 with any severe reactions post vaccine: Difficulty breathing  Swelling of face and throat  A fast heartbeat  A bad rash all over body  Dizziness and weakness   Immunizations Administered     Name Date Dose VIS Date Route   PFIZER Comrnaty(Gray TOP) Covid-19 Vaccine 02/17/2021 11:02 AM 0.3 mL 06/30/2020 Intramuscular   Manufacturer: [employer]   Lot: [id number]   [place]: [place], PharmD, MBA Clinical Acute Care Pharmacist

## 2021-02-21 ENCOUNTER — Encounter: Payer: Medicare Other | Admitting: Student in an Organized Health Care Education/Training Program

## 2021-02-27 ENCOUNTER — Ambulatory Visit (INDEPENDENT_AMBULATORY_CARE_PROVIDER_SITE_OTHER): Payer: Medicare Other | Admitting: Internal Medicine

## 2021-02-27 ENCOUNTER — Other Ambulatory Visit: Payer: Self-pay

## 2021-02-27 VITALS — BP 129/69 | HR 60

## 2021-02-27 DIAGNOSIS — I495 Sick sinus syndrome: Secondary | ICD-10-CM | POA: Diagnosis not present

## 2021-02-27 DIAGNOSIS — M1711 Unilateral primary osteoarthritis, right knee: Secondary | ICD-10-CM | POA: Diagnosis not present

## 2021-02-27 DIAGNOSIS — G894 Chronic pain syndrome: Secondary | ICD-10-CM

## 2021-02-27 DIAGNOSIS — Z950 Presence of cardiac pacemaker: Secondary | ICD-10-CM | POA: Diagnosis not present

## 2021-02-27 DIAGNOSIS — I10 Essential (primary) hypertension: Secondary | ICD-10-CM | POA: Diagnosis not present

## 2021-02-27 NOTE — Progress Notes (Signed)
Established Patient Office Visit  Subjective:  Patient ID: Sue Knapp, female    DOB: 06-11-1923  Age: 87 y.o. MRN: [id number]  CC: No chief complaint on file.   HPI  Sue Knapp presents for pacer check  Past Medical History:  Diagnosis Date   Arthritis    Breast cancer [facility]) 2013   left breast, radiation and lumpectomy   Cataract    Fatigue    GERD (gastroesophageal reflux disease)    Glaucoma    Heart murmur    Heart palpitations    Hiatal hernia    HOH (hard of hearing)    AIDS   Hyperlipidemia    Hypertension    Hypothyroidism    Left ankle injury    Old left ankle injury   Left bundle branch block    Neuropathy    Osteoporosis    Pacemaker    Palpitations    Personal history of radiation therapy 2013   left breast ca   Scoliosis    Shortness of breath dyspnea    Thyroid nodule    TIA (transient ischemic attack)     Past Surgical History:  Procedure Laterality Date   ABDOMINAL HYSTERECTOMY     BREAST BIOPSY Left 2005   bengin   BREAST BIOPSY Left 2013   stereo bx, invasive tubular carcinoma and DCIS   BREAST LUMPECTOMY Left 02/21/2012   invasive tubular carcinoma and DCIS, clear margins, negative LN   BREAST SURGERY     LUMPECTOMY   CATARACT EXTRACTION W/PHACO Right 03/01/2015   Procedure: CATARACT EXTRACTION PHACO AND INTRAOCULAR LENS PLACEMENT ([place]);  Surgeon: Birder Robson, MD;  Location: [facility];  Service: Ophthalmology;  Laterality: Right;  US:1:03.1 AP:22.8 CDE:14.37  Fluid lot # [id number]   CATARACT EXTRACTION W/PHACO Left 03/22/2015   Procedure: CATARACT EXTRACTION PHACO AND INTRAOCULAR LENS PLACEMENT (IOC);  Surgeon: Birder Robson, MD;  Location: [facility];  Service: Ophthalmology;  Laterality: Left;  [place]: 01:01.0 AP%: 22.5% CDE: 13.75 Lot # [id number]   ESOPHAGOGASTRODUODENOSCOPY (EGD) WITH PROPOFOL N/A 12/04/2020   Procedure: ESOPHAGOGASTRODUODENOSCOPY (EGD) WITH PROPOFOL;  Surgeon: Lin Landsman, MD;  Location: [place];  Service: Gastroenterology;  Laterality: N/A;   ESOPHAGOGASTRODUODENOSCOPY (EGD) WITH PROPOFOL N/A 12/04/2020   Procedure: ESOPHAGOGASTRODUODENOSCOPY (EGD) WITH PROPOFOL;  Surgeon: Lin Landsman, MD;  Location: [facility] ENDOSCOPY;  Service: Gastroenterology;  Laterality: N/A;   EYE SURGERY  09/2020 and 10/2020   MASTECTOMY PARTIAL / LUMPECTOMY     THYROIDECTOMY, PARTIAL      Family History  Problem Relation Age of Onset   Breast cancer Neg Hx     Social History   Socioeconomic History   Marital status: Widowed    Spouse name: Not on file   Number of children: Not on file   Years of education: Not on file   Highest education level: Not on file  Occupational History   Not on file  Tobacco Use   Smoking status: Never   Smokeless tobacco: Never  Substance and Sexual Activity   Alcohol use: No   Drug use: Yes    Comment: pain clinic: tramadol   Sexual activity: Not on file  Other Topics Concern   Not on file  Social History Narrative   Not on file   Social Determinants of Health   Financial Resource Strain: Not on file  Food Insecurity: Not on file  Transportation Needs: Not on file  Physical Activity: Not on file  Stress: Not on file  Social Connections: Not on file  Intimate Partner Violence: Not on file     Current Outpatient Medications:    acetaminophen (TYLENOL) 650 MG CR tablet, Take 2 tablets (1,300 mg total) by mouth 2 (two) times daily as needed for pain., Disp: , Rfl:    albuterol (VENTOLIN HFA) 108 (90 Base) MCG/ACT inhaler, Inhale 2 puffs into the lungs every 6 (six) hours as needed for wheezing or shortness of breath., Disp: 8 g, Rfl: 4   ALPRAZolam (XANAX) 0.25 MG tablet, Take 1 tablet by mouth daily as needed., Disp: , Rfl:    COVID-19 mRNA Vac-TriS, Pfizer, (PFIZER-BIONT COVID-19 VAC-TRIS) SUSP injection, Inject into the muscle., Disp: 0.3 mL, Rfl: 0   docusate sodium (COLACE) 100 MG capsule, Take 1-2 capsules (100-200 mg total) by mouth at  bedtime as needed for moderate constipation., Disp: 60 capsule, Rfl: 5   ELIQUIS 2.5 MG TABS tablet, TAKE 1 TABLET BY MOUTH TWICE DAILY, Disp: 60 tablet, Rfl: 6   furosemide (LASIX) 20 MG tablet, Take 1 tablet (20 mg total) by mouth daily., Disp: 30 tablet, Rfl: 3   losartan (COZAAR) 100 MG tablet, TAKE 1 TABLET BY MOUTH DAILY (Patient taking differently: 50 mg.), Disp: 90 tablet, Rfl: 1   metoprolol succinate (TOPROL-XL) 50 MG 24 hr tablet, TAKE 1 TABLET BY MOUTH DAILY, Disp: 30 tablet, Rfl: 6   omeprazole (PRILOSEC OTC) 20 MG tablet, Take 1 tablet (20 mg total) by mouth daily., Disp: 30 tablet, Rfl: 0   pregabalin (LYRICA) 50 MG capsule, TAKE 1 CAPSULE 3 TIMES DAILY (Patient taking differently: Take 50 mg by mouth 3 (three) times daily.), Disp: 90 capsule, Rfl: 3   psyllium (METAMUCIL) 58.6 % packet, Take 1 packet by mouth daily., Disp: 30 each, Rfl: 2   torsemide (DEMADEX) 20 MG tablet, Take 20 mg by mouth daily., Disp: , Rfl:    traMADol (ULTRAM-ER) 100 MG 24 hr tablet, Take 1 tablet (100 mg total) by mouth daily., Disp: 30 tablet, Rfl: 1   Allergies  Allergen Reactions   Codeine Other (See Comments)    ROS Review of Systems  Constitutional: Negative.   HENT: Negative.    Eyes: Negative.   Respiratory: Negative.    Cardiovascular: Negative.   Gastrointestinal: Negative.   Endocrine: Negative.   Genitourinary: Negative.   Skin: Negative.   Psychiatric/Behavioral: Negative.  Negative for agitation.   All other systems reviewed and are negative.    Objective:    Physical Exam Vitals reviewed.  Constitutional:      Appearance: Normal appearance.  HENT:     Mouth/Throat:     Mouth: Mucous membranes are moist.  Eyes:     Pupils: Pupils are equal, round, and reactive to light.  Neck:     Vascular: No carotid bruit.  Cardiovascular:     Rate and Rhythm: Normal rate. Rhythm irregular.     Pulses: Normal pulses.     Heart sounds: Normal heart sounds.  Pulmonary:     Effort:  Pulmonary effort is normal.     Breath sounds: Normal breath sounds.  Abdominal:     General: Bowel sounds are normal.     Palpations: Abdomen is soft. There is no hepatomegaly, splenomegaly or mass.     Tenderness: There is no abdominal tenderness.     Hernia: No hernia is present.  Musculoskeletal:        General: No tenderness.     Cervical back: Neck supple.     Right lower leg:  No edema.     Left lower leg: No edema.  Skin:    Findings: No rash.  Neurological:     Mental Status: She is alert and oriented to person, place, and time.     Motor: No weakness.  Psychiatric:        Mood and Affect: Mood and affect normal.        Behavior: Behavior normal.    There were no vitals taken for this visit. Wt Readings from Last 3 Encounters:  12/27/20 121 lb 12.8 oz (55.2 kg)  12/05/20 135 lb 11.2 oz (61.6 kg)  12/02/20 134 lb 7.7 oz (61 kg)     Health Maintenance Due  Topic Date Due   Zoster Vaccines- Shingrix (1 of 2) Never done   INFLUENZA VACCINE  02/20/2021    There are no preventive care reminders to display for this patient.  Lab Results  Component Value Date   TSH 1.84 05/23/2013   Lab Results  Component Value Date   WBC 7.4 12/13/2020   HGB 12.6 12/13/2020   HCT 38.1 12/13/2020   MCV 89.4 12/13/2020   PLT 263 12/13/2020   Lab Results  Component Value Date   NA 142 12/13/2020   K 3.6 12/13/2020   CO2 27 12/13/2020   GLUCOSE 111 (H) 12/13/2020   BUN 40 (H) 12/13/2020   CREATININE 1.26 (H) 12/13/2020   BILITOT 1.1 12/13/2020   ALKPHOS 71 12/13/2020   AST 32 12/13/2020   ALT 27 12/13/2020   PROT 6.3 (L) 12/13/2020   ALBUMIN 3.8 12/13/2020   CALCIUM 9.1 12/13/2020   ANIONGAP 14 12/13/2020   Lab Results  Component Value Date   CHOL 214 (H) 04/11/2013   Lab Results  Component Value Date   HDL 65 (H) 04/11/2013   Lab Results  Component Value Date   LDLCALC 131 (H) 04/11/2013   Lab Results  Component Value Date   TRIG 89 04/11/2013   No  results found for: CHOLHDL No results found for: HGBA1C    Assessment & Plan:   Problem List Items Addressed This Visit       Cardiovascular and Mediastinum   Sick sinus syndrome due to SA node dysfunction ([place]) - Primary   Essential hypertension    Blood pressure is under control         Musculoskeletal and Integument   Primary osteoarthritis of right knee    Refer to Ortho         Other   Chronic pain syndrome    Refer to pain specialist       Pacemaker    Battery life of the pacemaker is 22 months      Note: Medical Device Follow-up  Patient pacemaker was interrogated by pacemakers analyzer, battery status is okay.  No programming changes were indicated after the review of the data.  Histogram shows no change since the last interrogation Atrial and ventricular sensing thresholds were found to be acceptable Impedance was checked and it was found to be normal.  Thresholds were found to be okay on evaluation of rhythm problem.  No high rate or low rate arrhythmia were noted.  Estimated battery longevity is 22 months. I have personally reviewed the device data and amended the report as necessary.    No orders of the defined types were placed in this encounter.   Follow-up: No follow-ups on file.    Cletis Athens, MD

## 2021-02-27 NOTE — Assessment & Plan Note (Signed)
Battery life of the pacemaker is 22 months

## 2021-02-27 NOTE — Assessment & Plan Note (Addendum)
Refer to pain specialist

## 2021-02-27 NOTE — Assessment & Plan Note (Signed)
Blood pressure is under control 

## 2021-02-27 NOTE — Assessment & Plan Note (Signed)
Refer to Ortho?

## 2021-03-07 ENCOUNTER — Ambulatory Visit: Payer: Medicare Other | Admitting: Internal Medicine

## 2021-03-11 ENCOUNTER — Other Ambulatory Visit: Payer: Self-pay

## 2021-03-11 ENCOUNTER — Emergency Department
Admission: EM | Admit: 2021-03-11 | Discharge: 2021-03-12 | Disposition: A | Discharge status: Home / Self Care | Payer: Medicare Other | Attending: Emergency Medicine | Admitting: Emergency Medicine

## 2021-03-11 DIAGNOSIS — I509 Heart failure, unspecified: Secondary | ICD-10-CM | POA: Diagnosis not present

## 2021-03-11 DIAGNOSIS — Z79899 Other long term (current) drug therapy: Secondary | ICD-10-CM | POA: Diagnosis not present

## 2021-03-11 DIAGNOSIS — I110 Hypertensive heart disease with heart failure: Secondary | ICD-10-CM | POA: Insufficient documentation

## 2021-03-11 DIAGNOSIS — B9689 Other specified bacterial agents as the cause of diseases classified elsewhere: Secondary | ICD-10-CM | POA: Insufficient documentation

## 2021-03-11 DIAGNOSIS — N390 Urinary tract infection, site not specified: Secondary | ICD-10-CM | POA: Diagnosis not present

## 2021-03-11 DIAGNOSIS — Z7901 Long term (current) use of anticoagulants: Secondary | ICD-10-CM | POA: Diagnosis not present

## 2021-03-11 DIAGNOSIS — Z950 Presence of cardiac pacemaker: Secondary | ICD-10-CM | POA: Diagnosis not present

## 2021-03-11 DIAGNOSIS — E039 Hypothyroidism, unspecified: Secondary | ICD-10-CM | POA: Insufficient documentation

## 2021-03-11 DIAGNOSIS — Z853 Personal history of malignant neoplasm of breast: Secondary | ICD-10-CM | POA: Diagnosis not present

## 2021-03-11 DIAGNOSIS — R531 Weakness: Secondary | ICD-10-CM | POA: Diagnosis not present

## 2021-03-11 DIAGNOSIS — R5383 Other fatigue: Secondary | ICD-10-CM | POA: Diagnosis not present

## 2021-03-11 LAB — CBG MONITORING, ED: Glucose-Capillary: 80 mg/dL (ref 70–99)

## 2021-03-11 MED ORDER — SODIUM CHLORIDE 0.9 % IV SOLN: INTRAVENOUS | Status: AC

## 2021-03-11 NOTE — ED Triage Notes (Signed)
Pt BIB in by EMS due to having weakness. Per staff, pt received extra xanax today. Pt is prescribed xanax. Pt is a&ox4 and conversing with providers.

## 2021-03-12 ENCOUNTER — Emergency Department: Payer: Medicare Other

## 2021-03-12 LAB — COMPREHENSIVE METABOLIC PANEL
ALT: 17 U/L (ref 0–44)
AST: 20 U/L (ref 15–41)
Albumin: 3.2 g/dL — ABNORMAL LOW (ref 3.5–5.0)
Alkaline Phosphatase: 77 U/L (ref 38–126)
Anion gap: 4 — ABNORMAL LOW (ref 5–15)
BUN: 30 mg/dL — ABNORMAL HIGH (ref 8–23)
CO2: 32 mmol/L (ref 22–32)
Calcium: 8.8 mg/dL — ABNORMAL LOW (ref 8.9–10.3)
Chloride: 104 mmol/L (ref 98–111)
Creatinine, Ser: 1.06 mg/dL — ABNORMAL HIGH (ref 0.44–1.00)
GFR, Estimated: 48 mL/min — ABNORMAL LOW (ref >60)
Glucose, Bld: 104 mg/dL — ABNORMAL HIGH (ref 70–99)
Potassium: 3.6 mmol/L (ref 3.5–5.1)
Sodium: 140 mmol/L (ref 135–145)
Total Bilirubin: 0.8 mg/dL (ref 0.3–1.2)
Total Protein: 5.6 g/dL — ABNORMAL LOW (ref 6.5–8.1)

## 2021-03-12 LAB — URINALYSIS, COMPLETE (UACMP) WITH MICROSCOPIC
Bilirubin Urine: NEGATIVE
Glucose, UA: NEGATIVE mg/dL
Hgb urine dipstick: NEGATIVE
Ketones, ur: NEGATIVE mg/dL
Nitrite: POSITIVE — AB
Protein, ur: NEGATIVE mg/dL
Specific Gravity, Urine: 1.015 (ref 1.005–1.030)
Squamous Epithelial / HPF: NONE SEEN (ref 0–5)
pH: 6.5 (ref 5.0–8.0)

## 2021-03-12 LAB — CBC WITH DIFFERENTIAL/PLATELET
Abs Immature Granulocytes: 0.06 10*3/uL (ref 0.00–0.07)
Basophils Absolute: 0.1 10*3/uL (ref 0.0–0.1)
Basophils Relative: 1 %
Eosinophils Absolute: 0.3 10*3/uL (ref 0.0–0.5)
Eosinophils Relative: 3 %
HCT: 38.1 % (ref 36.0–46.0)
Hemoglobin: 12.4 g/dL (ref 12.0–15.0)
Immature Granulocytes: 1 %
Lymphocytes Relative: 14 %
Lymphs Abs: 1.4 10*3/uL (ref 0.7–4.0)
MCH: 30.1 pg (ref 26.0–34.0)
MCHC: 32.5 g/dL (ref 30.0–36.0)
MCV: 92.5 fL (ref 80.0–100.0)
Monocytes Absolute: 0.7 10*3/uL (ref 0.1–1.0)
Monocytes Relative: 7 %
Neutro Abs: 7.3 10*3/uL (ref 1.7–7.7)
Neutrophils Relative %: 74 %
Platelets: 294 10*3/uL (ref 150–400)
RBC: 4.12 MIL/uL (ref 3.87–5.11)
RDW: 18.4 % — ABNORMAL HIGH (ref 11.5–15.5)
WBC: 9.7 10*3/uL (ref 4.0–10.5)
nRBC: 0 % (ref 0.0–0.2)

## 2021-03-12 LAB — TROPONIN I (HIGH SENSITIVITY)
Troponin I (High Sensitivity): 16 ng/L (ref <18)
Troponin I (High Sensitivity): 16 ng/L (ref <18)

## 2021-03-12 MED ORDER — CEPHALEXIN 500 MG PO CAPS: 500.0000 mg | ORAL_CAPSULE | Freq: Two times a day (BID) | ORAL | 0 refills | Status: DC

## 2021-03-12 MED ORDER — TRAMADOL HCL 50 MG PO TABS
50.0000 mg | ORAL_TABLET | Freq: Once | ORAL | Status: AC
  Administered 2021-03-12: 50 mg via ORAL
  Filled 2021-03-12: qty 1

## 2021-03-12 MED ORDER — SODIUM CHLORIDE 0.9 % IV SOLN
1.0000 g | Freq: Once | INTRAVENOUS | Status: AC
  Administered 2021-03-12: 1 g via INTRAVENOUS
  Filled 2021-03-12: qty 10

## 2021-03-12 NOTE — ED Notes (Signed)
[facility] to transport to [facility]

## 2021-03-12 NOTE — ED Provider Notes (Signed)
[facility] Emergency Department Provider Note  ____________________________________________   Event Date/Time   First MD Initiated Contact with Patient 03/11/21 2332     (approximate)  I have reviewed the triage vital signs and the nursing notes.   HISTORY  Chief Complaint Weakness    HPI Sue Knapp is a 87 y.o. female with history of hypertension, hyperlipidemia, CHF, sick sinus syndrome status post pacemaker, atrial fibrillation on Eliquis who presents to the emergency department from assisted living facility [facility] [place] after she has been more fatigued.  She states that she has felt more weak over the past couple of days.  Spoke with Alyse Low at the nursing home who reports that patient has been sleepy and groggy all day today and yesterday.  They report she was complaining of increased trouble swallowing and has a history of dysphagia.  She did eat about 50% of her dinner tonight.  They state that she has had a harder time walking but has been able to ambulate and uses a walker which is her baseline.  No falls.  No recent fevers, cough, vomiting or diarrhea.  Patient receives 0.25 mg of Xanax as well as tramadol.  Per the [facility] she received 0.5 mg of Xanax at 7 AM, noon and 5 PM yesterday and did not receive her tramadol.  Patient denies any chest pain, shortness of breath, abdominal pain, numbness, tingling or focal weakness.      Past Medical History:  Diagnosis Date   Arthritis    Breast cancer ([place]) 2013   left breast, radiation and lumpectomy   Cataract    Fatigue    GERD (gastroesophageal reflux disease)    Glaucoma    Heart murmur    Heart palpitations    Hiatal hernia    HOH (hard of hearing)    AIDS   Hyperlipidemia    Hypertension    Hypothyroidism    Left ankle injury    Old left ankle injury   Left bundle branch block    Neuropathy    Osteoporosis    Pacemaker    Palpitations    Personal history of radiation  therapy 2013   left breast ca   Scoliosis    Shortness of breath dyspnea    Thyroid nodule    TIA (transient ischemic attack)     Patient Active Problem List   Diagnosis Date Noted   Dysphagia 12/01/2020   CHF (congestive heart failure) ([place]) 12/01/2020   Shortness of breath 10/31/2020   MP (metacarpophalangeal) joint sprain, initial encounter 04/04/2020   Dermatitis 04/04/2020   Rotator cuff arthropathy of left shoulder 03/03/2020   Annual physical exam 02/10/2020   Hx of breast cancer 12/11/2019   Essential hypertension 12/10/2019   Pacemaker 11/26/2019   Sick sinus syndrome due to SA node dysfunction ([place]) 11/26/2019   Primary osteoarthritis of right knee 03/03/2019   Primary osteoarthritis of left knee 03/03/2019   Bilateral primary osteoarthritis of knee 03/03/2019   Osteoarthritis of right shoulder 03/03/2019   Chronic pain syndrome 03/03/2019    Past Surgical History:  Procedure Laterality Date   ABDOMINAL HYSTERECTOMY     BREAST BIOPSY Left 2005   [place]   BREAST BIOPSY Left 2013   stereo bx, invasive tubular carcinoma and DCIS   BREAST LUMPECTOMY Left 02/21/2012   invasive tubular carcinoma and DCIS, clear margins, 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   BREAST SURGERY     LUMPECTOMY   CATARACT EXTRACTION W/PHACO Right 03/01/2015  Procedure: CATARACT EXTRACTION PHACO AND INTRAOCULAR LENS PLACEMENT (IOC);  Surgeon: Birder Robson, MD;  Location: [facility] ORS;  Service: Ophthalmology;  Laterality: Right;  US:1:03.1 AP:22.8 CDE:14.37  Fluid lot # [id number]   CATARACT EXTRACTION W/PHACO Left 03/22/2015   Procedure: CATARACT EXTRACTION PHACO AND INTRAOCULAR LENS PLACEMENT (IOC);  Surgeon: Birder Robson, MD;  Location: [facility] ORS;  Service: Ophthalmology;  Laterality: Left;  [place]: 01:01.0 AP%: 22.5% CDE: 13.75 Lot # [id number]   ESOPHAGOGASTRODUODENOSCOPY (EGD) WITH PROPOFOL N/A 12/04/2020   Procedure: ESOPHAGOGASTRODUODENOSCOPY (EGD) WITH PROPOFOL;  Surgeon: Lin Landsman, MD;  Location: [place];  Service: Gastroenterology;  Laterality: N/A;   ESOPHAGOGASTRODUODENOSCOPY (EGD) WITH PROPOFOL N/A 12/04/2020   Procedure: ESOPHAGOGASTRODUODENOSCOPY (EGD) WITH PROPOFOL;  Surgeon: Lin Landsman, MD;  Location: [facility] ENDOSCOPY;  Service: Gastroenterology;  Laterality: N/A;   EYE SURGERY  09/2020 and 10/2020   MASTECTOMY PARTIAL / LUMPECTOMY     THYROIDECTOMY, PARTIAL      Prior to Admission medications   Medication Sig Start Date End Date Taking? Authorizing Provider  cephALEXin (KEFLEX) 500 MG capsule Take 1 capsule (500 mg total) by mouth 2 (two) times daily. 03/12/21  Yes Atlantis Delong, Delice Bison, DO  acetaminophen (TYLENOL) 650 MG CR tablet Take 2 tablets (1,300 mg total) by mouth 2 (two) times daily as needed for pain. 12/04/20   Jennye Boroughs, MD  albuterol (VENTOLIN HFA) 108 (90 Base) MCG/ACT inhaler Inhale 2 puffs into the lungs every 6 (six) hours as needed for wheezing or shortness of breath. 09/09/20   Beckie Salts, FNP  ALPRAZolam Duanne Moron) 0.25 MG tablet Take 1 tablet by mouth daily as needed. 09/15/19   [provider]  COVID-19 mRNA Vac-TriS, Pfizer, (PFIZER-BIONT COVID-19 VAC-TRIS) SUSP injection Inject into the muscle. 02/17/21   Carlyle Basques, MD  docusate sodium (COLACE) 100 MG capsule Take 1-2 capsules (100-200 mg total) by mouth at bedtime as needed for moderate constipation. 11/03/20 05/02/21  Gillis Santa, MD  ELIQUIS 2.5 MG TABS tablet TAKE 1 TABLET BY MOUTH TWICE DAILY 11/30/20   Cletis Athens, MD  furosemide (LASIX) 20 MG tablet Take 1 tablet (20 mg total) by mouth daily. 10/31/20   Beckie Salts, FNP  losartan (COZAAR) 100 MG tablet TAKE 1 TABLET BY MOUTH DAILY Patient taking differently: 50 mg. 10/27/20   Beckie Salts, FNP  metoprolol succinate (TOPROL-XL) 50 MG 24 hr tablet TAKE 1 TABLET BY MOUTH DAILY 07/11/20   Cletis Athens, MD  omeprazole (PRILOSEC OTC) 20 MG tablet Take 1 tablet (20 mg total) by mouth daily. 12/04/20 01/03/21  Jennye Boroughs, MD   pregabalin (LYRICA) 50 MG capsule TAKE 1 CAPSULE 3 TIMES DAILY Patient taking differently: Take 50 mg by mouth 3 (three) times daily. 10/27/20   Beckie Salts, FNP  psyllium (METAMUCIL) 58.6 % packet Take 1 packet by mouth daily. 12/12/20   Virgel Manifold, MD  torsemide (DEMADEX) 20 MG tablet Take 20 mg by mouth daily.    [provider]  traMADol (ULTRAM-ER) 100 MG 24 hr tablet Take 1 tablet (100 mg total) by mouth daily. 11/03/20   Gillis Santa, MD    Allergies Codeine  Family History  Problem Relation Age of Onset   Breast cancer Neg Hx     Social History Social History   Tobacco Use   Smoking status: Never   Smokeless tobacco: Never  Substance Use Topics   Alcohol use: No   Drug use: Yes    Comment: pain clinic: tramadol    Review of Systems  Constitutional: No fever. Eyes: No visual changes. ENT: No sore throat. Cardiovascular: Denies chest pain. Respiratory: Denies shortness of breath. Gastrointestinal: No nausea, vomiting, diarrhea. Genitourinary: Negative for dysuria. Musculoskeletal: Negative for back pain. Skin: Negative for rash. Neurological: Negative for focal weakness or numbness.  ____________________________________________   PHYSICAL EXAM:  VITAL SIGNS: ED Triage Vitals  Enc Vitals Group     BP 03/11/21 2335 (!) 156/75     Pulse Rate 03/11/21 2335 (!) 59     Resp 03/11/21 2335 (!) 25     Temp 03/11/21 2335 97.7 F (36.5 C)     Temp Source 03/11/21 2335 Oral     SpO2 03/11/21 2335 99 %     Weight --      Height --      Head Circumference --      Peak Flow --      Pain Score 03/11/21 2336 0     Pain Loc --      Pain Edu? --      Excl. in GC? --    CONSTITUTIONAL: Alert and oriented x 3 and responds appropriately to questions. Well-appearing; well-nourished, elderly HEAD: Normocephalic, atraumatic EYES: Conjunctivae clear, pupils appear equal, EOM appear intact ENT: normal nose; dry mucous membranes NECK: Supple, normal  ROM CARD: RRR; S1 and S2 appreciated; no murmurs, no clicks, no rubs, no gallops RESP: Normal chest excursion without splinting or tachypnea; breath sounds clear and equal bilaterally; no wheezes, no rhonchi, no rales, no hypoxia or respiratory distress, speaking full sentences ABD/GI: Normal bowel sounds; non-distended; soft, non-tender, no rebound, no guarding, no peritoneal signs, no hepatosplenomegaly BACK: The back appears normal EXT: Normal ROM in all joints; no deformity noted, no edema; no cyanosis SKIN: Normal color for age and race; warm; no rash on exposed skin NEURO: Moves all extremities equally, no drift, sensation to light touch intact diffusely, cranial nerves II through XII intact, normal speech PSYCH: The patient's mood and manner are appropriate.  ____________________________________________   LABS (all labs ordered are listed, but only abnormal results are displayed)  Labs Reviewed  CBC WITH DIFFERENTIAL/PLATELET - Abnormal; Notable for the following components:      Result Value   RDW 18.4 (*)    All other components within normal limits  URINALYSIS, COMPLETE (UACMP) WITH MICROSCOPIC - Abnormal; Notable for the following components:   Nitrite POSITIVE (*)    Leukocytes,Ua SMALL (*)    Bacteria, UA RARE (*)    All other components within normal limits  COMPREHENSIVE METABOLIC PANEL - Abnormal; Notable for the following components:   Glucose, Bld 104 (*)    BUN 30 (*)    Creatinine, Ser 1.06 (*)    Calcium 8.8 (*)    Total Protein 5.6 (*)    Albumin 3.2 (*)    GFR, Estimated 48 (*)    Anion gap 4 (*)    All other components within normal limits  URINE CULTURE  CBG MONITORING, ED  TROPONIN I (HIGH SENSITIVITY)  TROPONIN I (HIGH SENSITIVITY)   ____________________________________________  EKG   EKG Interpretation  Date/Time:  Saturday March 11 2021 23:40:31 EDT Ventricular Rate:  60 PR Interval:    QRS Duration: 161 QT Interval:  489 QTC  Calculation: 489 R Axis:   -78 Text Interpretation: Afib/flutter and ventricular-paced rhythm No further analysis attempted due to paced rhythm Confirmed by Pryor Curia [phone]) on 03/12/2021 2:11:48 AM        ____________________________________________  RADIOLOGY Jessie Foot Andri Prestia, personally viewed  and evaluated these images (plain radiographs) as part of my medical decision making, as well as reviewing the written report by the radiologist.  ED MD interpretation: CT head shows no acute abnormality.  Official radiology report(s): CT HEAD WO CONTRAST (5MM)  Result Date: 03/12/2021 CLINICAL DATA:  Altered mental status EXAM: CT HEAD WITHOUT CONTRAST TECHNIQUE: Contiguous axial images were obtained from the base of the skull through the vertex without intravenous contrast. COMPARISON:  11/04/2020 FINDINGS: Brain: There is no mass, hemorrhage or extra-axial collection. There is generalized atrophy without lobar predilection. Hypodensity of the white matter is most commonly associated with chronic microvascular disease. Vascular: Atherosclerotic calcification of the vertebral and internal carotid arteries at the skull base. No abnormal hyperdensity of the major intracranial arteries or dural venous sinuses. Skull: The visualized skull base, calvarium and extracranial soft tissues are normal. Sinuses/Orbits: No fluid levels or advanced mucosal thickening of the visualized paranasal sinuses. No mastoid or middle ear effusion. The orbits are normal. IMPRESSION: Generalized atrophy and chronic microvascular ischemia without acute intracranial abnormality. Electronically Signed   By: Ulyses Jarred M.D.   On: 03/12/2021 02:56    ____________________________________________   PROCEDURES  Procedure(s) performed (including Critical Care):  .1-3 Lead EKG Interpretation  Date/Time: 03/12/2021 6:14 AM Performed by: Elford Evilsizer, Delice Bison, DO Authorized by: Milan Clare, Delice Bison, DO     Interpretation: normal      ECG rate:  65   ECG rate assessment: normal     Rhythm: paced     Ectopy: none     Conduction: normal     ____________________________________________   INITIAL IMPRESSION / ASSESSMENT AND PLAN / ED COURSE  As part of my medical decision making, I reviewed the following data within the electronic MEDICAL RECORD NUMBER History obtained from family, Nursing notes reviewed and incorporated, Labs reviewed , EKG interpreted , Old EKG reviewed, Old chart reviewed, CT head reviewed, and Notes from prior ED visits         Patient here with altered mental status with increased sleepiness and generalized weakness.  May be from accidentally receiving extra Xanax today.  Differential also includes dehydration, anemia, electrolyte derangement, ACS, arrhythmia, hypoglycemia, UTI, intracranial hemorrhage, stroke.  Will obtain labs, urine, EKG, CT head.  We will interrogate her pacemaker.  They report decreased oral intake of the past couple days.  We will hydrate her gently given she does have history of CHF.  She does have slightly dry mucous membranes on exam.  ED PROGRESS  Patient's head CT shows no acute abnormality.  Labs reassuring.  EKG nonischemic.  Patient does have a UTI.  Culture pending.  Given Rocephin here.  Previous urine culture has grown E. coli which was pansensitive.  We have not heard back from [employer] about the interrogation of her pacemaker.  It seems to be working appropriately as she is being paced and we have not appreciated any events on cardiac monitoring.  I do not want to delay her care any further at this time and I feel like her generalized weakness and grogginess are likely secondary to receiving extra Ativan and also her UTI.  Daughter agrees.  She has been arousable to voice urine neurologically intact.  No hypoxia noted.  Daughter was concerned as it sounds like she has not received any of her pain medication yesterday and she is complaining of pain.  We will give her her home  dose of tramadol.  Have offered admission for monitoring and IV antibiotics but daughter states  that she would like to have the patient go back to her nursing facility which I feel is very reasonable.  Will discharge with Keflex.  Discussed return precautions.  They verbalized understanding.  At this time, I do not feel there is any life-threatening condition present. I have reviewed, interpreted and discussed all results (EKG, imaging, lab, urine as appropriate) and exam findings with patient/family. I have reviewed nursing notes and appropriate previous records.  I feel the patient is safe to be discharged home without further emergent workup and can continue workup as an outpatient as needed. Discussed usual and customary return precautions. Patient/family verbalize understanding and are comfortable with this plan.  Outpatient follow-up has been provided as needed. All questions have been answered.  ____________________________________________   FINAL CLINICAL IMPRESSION(S) / ED DIAGNOSES  Final diagnoses:  Generalized weakness  Acute UTI     ED Discharge Orders          Ordered    cephALEXin (KEFLEX) 500 MG capsule  2 times daily        03/12/21 0514            *Please note:  Sue Knapp was evaluated in Emergency Department on 03/12/2021 for the symptoms described in the history of present illness. She was evaluated in the context of the global COVID-19 pandemic, which necessitated consideration that the patient might be at risk for infection with the SARS-CoV-2 virus that causes COVID-19. Institutional protocols and algorithms that pertain to the evaluation of patients at risk for COVID-19 are in a state of rapid change based on information released by regulatory bodies including the CDC and federal and state organizations. These policies and algorithms were followed during the patient's care in the ED.  Some ED evaluations and interventions may be delayed as a result of limited  staffing during and the pandemic.*   Note:  This document was prepared using Dragon voice recognition software and may include unintentional dictation errors.    Samie Reasons, Delice Bison, DO 03/12/21 [id number]

## 2021-03-13 LAB — URINE CULTURE

## 2021-03-20 ENCOUNTER — Other Ambulatory Visit: Payer: Self-pay | Admitting: Internal Medicine

## 2021-03-29 ENCOUNTER — Other Ambulatory Visit
Admission: RE | Admit: 2021-03-29 | Discharge: 2021-03-29 | Disposition: A | Discharge status: Home / Self Care | Payer: Medicare Other | Source: Ambulatory Visit | Attending: Internal Medicine | Admitting: Internal Medicine

## 2021-03-29 DIAGNOSIS — R32 Unspecified urinary incontinence: Secondary | ICD-10-CM | POA: Diagnosis present

## 2021-03-29 DIAGNOSIS — R300 Dysuria: Secondary | ICD-10-CM | POA: Insufficient documentation

## 2021-03-29 LAB — URINALYSIS, COMPLETE (UACMP) WITH MICROSCOPIC
Bacteria, UA: NONE SEEN
Bilirubin Urine: NEGATIVE
Glucose, UA: NEGATIVE mg/dL
Hgb urine dipstick: NEGATIVE
Ketones, ur: NEGATIVE mg/dL
Nitrite: NEGATIVE
Protein, ur: NEGATIVE mg/dL
RBC / HPF: NONE SEEN RBC/hpf (ref 0–5)
Specific Gravity, Urine: 1.01 (ref 1.005–1.030)
pH: 5.5 (ref 5.0–8.0)

## 2021-04-01 LAB — URINE CULTURE: Culture: 60000 — AB

## 2021-04-05 DIAGNOSIS — M150 Primary generalized (osteo)arthritis: Secondary | ICD-10-CM | POA: Insufficient documentation

## 2021-04-06 ENCOUNTER — Ambulatory Visit
Payer: Medicare Other | Attending: Student in an Organized Health Care Education/Training Program | Admitting: Student in an Organized Health Care Education/Training Program

## 2021-04-06 ENCOUNTER — Encounter: Payer: Self-pay | Admitting: Student in an Organized Health Care Education/Training Program

## 2021-04-06 ENCOUNTER — Other Ambulatory Visit: Payer: Self-pay

## 2021-04-06 VITALS — BP 138/62 | HR 59 | Temp 97.2°F | Resp 14 | Ht 60.0 in | Wt 121.0 lb

## 2021-04-06 DIAGNOSIS — M19111 Post-traumatic osteoarthritis, right shoulder: Secondary | ICD-10-CM | POA: Diagnosis present

## 2021-04-06 DIAGNOSIS — M1711 Unilateral primary osteoarthritis, right knee: Secondary | ICD-10-CM | POA: Insufficient documentation

## 2021-04-06 DIAGNOSIS — M19011 Primary osteoarthritis, right shoulder: Secondary | ICD-10-CM | POA: Diagnosis present

## 2021-04-06 DIAGNOSIS — M1712 Unilateral primary osteoarthritis, left knee: Secondary | ICD-10-CM | POA: Insufficient documentation

## 2021-04-06 DIAGNOSIS — G894 Chronic pain syndrome: Secondary | ICD-10-CM | POA: Diagnosis present

## 2021-04-06 DIAGNOSIS — S46001S Unspecified injury of muscle(s) and tendon(s) of the rotator cuff of right shoulder, sequela: Secondary | ICD-10-CM

## 2021-04-06 DIAGNOSIS — M170 Bilateral primary osteoarthritis of knee: Secondary | ICD-10-CM

## 2021-04-06 MED ORDER — TRAMADOL HCL ER 100 MG PO TB24: 100.0000 mg | ORAL_TABLET | Freq: Every day | ORAL | 2 refills | Status: DC

## 2021-04-06 NOTE — Patient Instructions (Addendum)
We are starting Sue Knapp on EXTENDED RELEASE Tramadol 100 mg daily to take in the morning after breakfast. Ok to utilize SHORT ACTING Tramadol 50 mg once to twice a day as needed for breakthrough pain Lets see how this does over the next 6-8 weeks and we'll touch base then, please call us in the next 3-4 weeks to give [place] an update on Sue Knapp.

## 2021-04-06 NOTE — Progress Notes (Signed)
PROVIDER NOTE: Information contained herein reflects review and annotations entered in association with encounter. Interpretation of such information and data should be left to medically-trained personnel. Information provided to patient can be located elsewhere in the medical record under "Patient Instructions". Document created using STT-dictation technology, any transcriptional errors that may result from process are unintentional.    Patient: Sue Knapp  Service Category: E/M  Provider: Gillis Santa, MD  DOB: 24-Nov-1922  DOS: 04/06/2021  Specialty: Interventional Pain Management  MRN: [id number]  Setting: Ambulatory outpatient  PCP: Cletis Athens, MD  Type: Established Patient    Referring Provider: Cletis Athens, MD  Location: Office  Delivery: Face-to-face     HPI  Sue Knapp, a 87 y.o. year old female, is here today because of her Primary osteoarthritis of right knee [M17.11]. Sue Knapp primary complain today is Knee Pain Pain Assessment: Severity of Chronic pain is reported as a 9 /10. Location: Knee (shoulder)  Vicente Males. Onset: More than a month ago. Quality: Aching, Constant, Throbbing. Timing: Constant. Modifying factor(s): meds and sitting down. Vitals:  height is 5' (1.524 m) and weight is 121 lb (54.9 kg). Her temperature is 97.2 F (36.2 C) (abnormal). Her blood pressure is 138/62 and her pulse is 59 (abnormal). Her respiration is 14 and oxygen saturation is 96%.   Reason for encounter: medication management.   Refill of extended release Tramadol 100 mg daily Ok to supplement with Tramadol 50 mg BID IR (immediate release) every 12 hrs for breakthrough pain    ROS  Constitutional: Denies any fever or chills Gastrointestinal: No reported hemesis, hematochezia, vomiting, or acute GI distress Musculoskeletal:  Diffuse arthralgias and myalgias. Neurological: No reported episodes of acute onset apraxia, aphasia, dysarthria, agnosia, amnesia, paralysis, loss of  coordination, or loss of consciousness  Medication Review  ALPRAZolam, COVID-19 mRNA Vac-TriS [employer]), acetaminophen, albuterol, apixaban, cephALEXin, docusate sodium, furosemide, losartan, metoprolol succinate, omeprazole, pregabalin, psyllium, torsemide, and traMADol  History Review  Allergy: Sue Knapp is allergic to codeine. Drug: Sue Knapp  reports current drug use. Alcohol:  reports no history of alcohol use. Tobacco:  reports that she has never smoked. She has never used smokeless tobacco. Social: Sue Knapp  reports that she has never smoked. She has never used smokeless tobacco. She reports current drug use. She reports that she does not drink alcohol. Medical:  has a past medical history of Arthritis, Breast cancer ([place]) (2013), Cataract, Fatigue, GERD (gastroesophageal reflux disease), Glaucoma, Heart murmur, Heart palpitations, Hiatal hernia, HOH (hard of hearing), Hyperlipidemia, Hypertension, Hypothyroidism, Left ankle injury, Left bundle branch block, Neuropathy, Osteoporosis, Pacemaker, Palpitations, Personal history of radiation therapy (2013), Scoliosis, Shortness of breath dyspnea, Thyroid nodule, and TIA (transient ischemic attack). Surgical: Sue Knapp  has a past surgical history that includes Thyroidectomy, partial; Abdominal hysterectomy; Breast surgery; Cataract extraction w/PHACO (Right, 03/01/2015); Cataract extraction w/PHACO (Left, 03/22/2015); Mastectomy partial / lumpectomy; Breast lumpectomy (Left, 02/21/2012); Breast biopsy (Left, 2005); Breast biopsy (Left, 2013); Eye surgery (09/2020 and 10/2020); Esophagogastroduodenoscopy (egd) with propofol (N/A, 12/04/2020); and Esophagogastroduodenoscopy (egd) with propofol (N/A, 12/04/2020). Family: family history is not on file.  Laboratory Chemistry Profile   Renal Lab Results  Component Value Date   BUN 30 (H) 03/12/2021   CREATININE 1.06 (H) 03/12/2021   BCR 31 (H) 11/28/2020   GFRAA 37 (L) 11/28/2020   GFRNONAA 48  (L) 03/12/2021     Hepatic Lab Results  Component Value Date   AST 20 03/12/2021   ALT 17 03/12/2021  ALBUMIN 3.2 (L) 03/12/2021   ALKPHOS 77 03/12/2021   LIPASE 36 09/14/2019     Electrolytes Lab Results  Component Value Date   NA 140 03/12/2021   K 3.6 03/12/2021   CL 104 03/12/2021   CALCIUM 8.8 (L) 03/12/2021   MG 1.9 12/04/2020     Bone No results found for: VD25OH, VD125OH2TOT, QI3474QV9, DG3875IE3, 25OHVITD1, 25OHVITD2, 25OHVITD3, TESTOFREE, TESTOSTERONE   Inflammation (CRP: Acute Phase) (ESR: Chronic Phase) No results found for: CRP, ESRSEDRATE, LATICACIDVEN     Note: Above Lab results reviewed.  Recent Imaging Review  CT HEAD WO CONTRAST (5MM) CLINICAL DATA:  Altered mental status  EXAM: CT HEAD WITHOUT CONTRAST  TECHNIQUE: Contiguous axial images were obtained from the base of the skull through the vertex without intravenous contrast.  COMPARISON:  11/04/2020  FINDINGS: Brain: There is no mass, hemorrhage or extra-axial collection. There is generalized atrophy without lobar predilection. Hypodensity of the white matter is most commonly associated with chronic microvascular disease.  Vascular: Atherosclerotic calcification of the vertebral and internal carotid arteries at the skull base. No abnormal hyperdensity of the major intracranial arteries or dural venous sinuses.  Skull: The visualized skull base, calvarium and extracranial soft tissues are normal.  Sinuses/Orbits: No fluid levels or advanced mucosal thickening of the visualized paranasal sinuses. No mastoid or middle ear effusion. The orbits are normal.  IMPRESSION: Generalized atrophy and chronic microvascular ischemia without acute intracranial abnormality.  Electronically Signed   By: Ulyses Jarred M.D.   On: 03/12/2021 02:56 Note: Reviewed        Physical Exam  General appearance: Well nourished, well developed, and well hydrated. In no apparent acute distress Mental  status: Alert, oriented x 3 (person, place, & time)       Respiratory: No evidence of acute respiratory distress Eyes: PERLA Vitals: BP 138/62   Pulse (!) 59   Temp (!) 97.2 F (36.2 C)   Resp 14   Ht 5' (1.524 m)   Wt 121 lb (54.9 kg)   SpO2 96%   BMI 23.63 kg/m  BMI: Estimated body mass index is 23.63 kg/m as calculated from the following:   Height as of this encounter: 5' (1.524 m).   Weight as of this encounter: 121 lb (54.9 kg). Ideal: Ideal body weight: 45.5 kg (100 lb 4.9 oz) Adjusted ideal body weight: 49.3 kg (108 lb 9.4 oz)  Lumbar Spine Area Exam  Skin & Axial Inspection: No masses, redness, or swelling Alignment: Symmetrical Functional ROM: Pain restricted ROM       Stability: No instability detected Muscle Tone/Strength: Functionally intact. No obvious neuro-muscular anomalies detected. Sensory (Neurological): Musculoskeletal pain pattern   Gait & Posture Assessment  Ambulation: Patient came in today in a wheel chair Gait: Limited. Using assistive device to ambulate Posture: Difficulty standing up straight, due to pain    Lower Extremity Exam      Side: Right lower extremity   Side: Left lower extremity  Stability: No instability observed           Stability: No instability observed          Skin & Extremity Inspection: brace in place   Skin & Extremity Inspection: Atrophy  Functional ROM: Pain restricted ROM for knee joint           Functional ROM: Pain restricted ROM for knee joint          Muscle Tone/Strength: Functionally intact. No obvious neuro-muscular anomalies detected.   Muscle Tone/Strength: Functionally  intact. No obvious neuro-muscular anomalies detected.  Sensory (Neurological): Arthropathic arthralgia         Sensory (Neurological): Arthropathic arthralgia         Assessment   Status Diagnosis  Controlled Controlled Controlled 1. Primary osteoarthritis of right knee   2. Primary osteoarthritis of left knee   3. Osteoarthritis of right  shoulder due to rotator cuff injury   4. Osteoarthritis of right shoulder, unspecified osteoarthritis type   5. Bilateral primary osteoarthritis of knee   6. Chronic pain syndrome       Plan of Care   Ms. WAYNETTA METHENY has a current medication list which includes the following long-term medication(s): albuterol, eliquis, furosemide, losartan, metoprolol succinate, pregabalin, torsemide, and omeprazole.  Pharmacotherapy (Medications Ordered): Meds ordered this encounter  Medications   traMADol (ULTRAM-ER) 100 MG 24 hr tablet    Sig: Take 1 tablet (100 mg total) by mouth daily.    Dispense:  30 tablet    Refill:  2    Follow-up plan:   Return in about 10 weeks (around 06/15/2021) for Medication Management, in person.     Status post right knee genicular nerve block 1 on 03/09/2019.  S/p right shoulder steroid injection 03/23/19. 05/04/2019:  R Genicular RFA- not effective.        Recent Visits No visits were found meeting these conditions. Showing recent visits within past 90 days and meeting all other requirements Today's Visits Date Type Provider Dept  04/06/21 Office Visit Gillis Santa, MD [facility]  Showing today's visits and meeting all other requirements Future Appointments Date Type Provider Dept  06/13/21 Appointment Gillis Santa, MD [facility]  Showing future appointments within next 90 days and meeting all other requirements I discussed the assessment and treatment plan with the patient. The patient was provided an opportunity to ask questions and all were answered. The patient agreed with the plan and demonstrated an understanding of the instructions.  Patient advised to call back or seek an in-person evaluation if the symptoms or condition worsens.  Duration of encounter: 21mnutes.  Note by: BGillis Santa MD Date: 04/06/2021; Time: 1:29 PM

## 2021-04-06 NOTE — Progress Notes (Signed)
Nursing Pain Medication Assessment:  Safety precautions to be maintained throughout the outpatient stay will include: orient to surroundings, keep bed in low position, maintain call bell within reach at all times, provide assistance with transfer out of bed and ambulation.  Medication Inspection Compliance: Ms. Danis did not comply with our request to bring her pills to be counted. She was reminded that bringing the medication bottles, even when empty, is a requirement.  Medication: None brought in. Pill/Patch Count: None available to be counted. Bottle Appearance: No container available. Did not bring bottle(s) to appointment. Filled Date: N/A Last Medication intake:   Pt is living in assistance living , not able to bring with her.  today   Safety precautions to be maintained throughout the outpatient stay will include: orient to surroundings, keep bed in low position, maintain call bell within reach at all times, provide assistance with transfer out of bed and ambulation.

## 2021-05-17 ENCOUNTER — Telehealth: Payer: Self-pay

## 2021-05-17 NOTE — Telephone Encounter (Signed)
She is still having breakthrough pain and her daughter wants Dr. Holley Raring to review her meds and see if there is anything that can be adjusted to help her with her pain.

## 2021-05-23 ENCOUNTER — Telehealth: Payer: Self-pay

## 2021-05-29 ENCOUNTER — Encounter: Payer: Self-pay | Admitting: Internal Medicine

## 2021-05-29 ENCOUNTER — Other Ambulatory Visit: Payer: Self-pay

## 2021-05-29 ENCOUNTER — Ambulatory Visit (INDEPENDENT_AMBULATORY_CARE_PROVIDER_SITE_OTHER): Payer: Medicare Other | Admitting: Internal Medicine

## 2021-05-29 DIAGNOSIS — I495 Sick sinus syndrome: Secondary | ICD-10-CM

## 2021-05-29 DIAGNOSIS — Z950 Presence of cardiac pacemaker: Secondary | ICD-10-CM

## 2021-05-29 DIAGNOSIS — M170 Bilateral primary osteoarthritis of knee: Secondary | ICD-10-CM | POA: Diagnosis not present

## 2021-05-29 DIAGNOSIS — I10 Essential (primary) hypertension: Secondary | ICD-10-CM

## 2021-05-29 NOTE — Progress Notes (Signed)
Established Patient Office Visit  Subjective:  Patient ID: Sue Knapp, female    DOB: 1922/09/10  Age: 87 y.o. MRN: [id number]  CC:  Chief Complaint  Patient presents with   Pacemaker Check    HPI  Sue Knapp presents for pacemaker check she has a chronic pain syndrome  Past Medical History:  Diagnosis Date   Arthritis    Breast cancer ([place]) 2013   left breast, radiation and lumpectomy   Cataract    Fatigue    GERD (gastroesophageal reflux disease)    Glaucoma    Heart murmur    Heart palpitations    Hiatal hernia    HOH (hard of hearing)    AIDS   Hyperlipidemia    Hypertension    Hypothyroidism    Left ankle injury    Old left ankle injury   Left bundle branch block    Neuropathy    Osteoporosis    Pacemaker    Palpitations    Personal history of radiation therapy 2013   left breast ca   Scoliosis    Shortness of breath dyspnea    Thyroid nodule    TIA (transient ischemic attack)     Past Surgical History:  Procedure Laterality Date   ABDOMINAL HYSTERECTOMY     BREAST BIOPSY Left 2005   bengin   BREAST BIOPSY Left 2013   stereo bx, invasive tubular carcinoma and DCIS   BREAST LUMPECTOMY Left 02/21/2012   invasive tubular carcinoma and DCIS, clear margins, 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   BREAST SURGERY     LUMPECTOMY   CATARACT EXTRACTION W/PHACO Right 03/01/2015   Procedure: CATARACT EXTRACTION PHACO AND INTRAOCULAR LENS PLACEMENT ([place]);  Surgeon: Birder Robson, MD;  Location: [facility];  Service: Ophthalmology;  Laterality: Right;  US:1:03.1 AP:22.8 CDE:14.37  Fluid lot # [id number]   CATARACT EXTRACTION W/PHACO Left 03/22/2015   Procedure: CATARACT EXTRACTION PHACO AND INTRAOCULAR LENS PLACEMENT ([facility]);  Surgeon: Birder Robson, MD;  Location: [facility];  Service: Ophthalmology;  Laterality: Left;  [place]: 01:01.0 AP%: 22.5% CDE: 13.75 Lot # [id number]   ESOPHAGOGASTRODUODENOSCOPY (EGD) WITH PROPOFOL N/A 12/04/2020   Procedure: ESOPHAGOGASTRODUODENOSCOPY  (EGD) WITH PROPOFOL;  Surgeon: Lin Landsman, MD;  Location: [place];  Service: Gastroenterology;  Laterality: N/A;   ESOPHAGOGASTRODUODENOSCOPY (EGD) WITH PROPOFOL N/A 12/04/2020   Procedure: ESOPHAGOGASTRODUODENOSCOPY (EGD) WITH PROPOFOL;  Surgeon: Lin Landsman, MD;  Location: [facility];  Service: Gastroenterology;  Laterality: N/A;   EYE SURGERY  09/2020 and 10/2020   MASTECTOMY PARTIAL / LUMPECTOMY     THYROIDECTOMY, PARTIAL      Family History  Problem Relation Age of Onset   Breast cancer Neg Hx     Social History   Socioeconomic History   Marital status: Widowed    Spouse name: Not on file   Number of children: Not on file   Years of education: Not on file   Highest education level: Not on file  Occupational History   Not on file  Tobacco Use   Smoking status: Never   Smokeless tobacco: Never  Substance and Sexual Activity   Alcohol use: No   Drug use: Yes    Comment: pain clinic: tramadol   Sexual activity: Not on file  Other Topics Concern   Not on file  Social History Narrative   Not on file   Social Determinants of Health   Financial Resource Strain: Not on file  Food Insecurity: Not on file  Transportation Needs: Not on  file  Physical Activity: Not on file  Stress: Not on file  Social Connections: Not on file  Intimate Partner Violence: Not on file     Current Outpatient Medications:    acetaminophen (TYLENOL) 650 MG CR tablet, Take 2 tablets (1,300 mg total) by mouth 2 (two) times daily as needed for pain., Disp: , Rfl:    albuterol (VENTOLIN HFA) 108 (90 Base) MCG/ACT inhaler, Inhale 2 puffs into the lungs every 6 (six) hours as needed for wheezing or shortness of breath., Disp: 8 g, Rfl: 4   ALPRAZolam (XANAX) 0.25 MG tablet, Take 1 tablet by mouth daily as needed., Disp: , Rfl:    cephALEXin (KEFLEX) 500 MG capsule, Take 1 capsule (500 mg total) by mouth 2 (two) times daily., Disp: 14 capsule, Rfl: 0   COVID-19 mRNA Vac-TriS,  Pfizer, (PFIZER-BIONT COVID-19 VAC-TRIS) SUSP injection, Inject into the muscle., Disp: 0.3 mL, Rfl: 0   ELIQUIS 2.5 MG TABS tablet, TAKE 1 TABLET BY MOUTH TWICE DAILY, Disp: 60 tablet, Rfl: 6   furosemide (LASIX) 20 MG tablet, Take 1 tablet (20 mg total) by mouth daily., Disp: 30 tablet, Rfl: 3   losartan (COZAAR) 100 MG tablet, TAKE 1 TABLET BY MOUTH DAILY (Patient taking differently: 50 mg.), Disp: 90 tablet, Rfl: 1   metoprolol succinate (TOPROL-XL) 50 MG 24 hr tablet, TAKE 1 TABLET BY MOUTH DAILY, Disp: 30 tablet, Rfl: 6   omeprazole (PRILOSEC OTC) 20 MG tablet, Take 1 tablet (20 mg total) by mouth daily., Disp: 30 tablet, Rfl: 0   pregabalin (LYRICA) 50 MG capsule, TAKE 1 CAPSULE 3 TIMES DAILY (Patient taking differently: Take 50 mg by mouth 3 (three) times daily.), Disp: 90 capsule, Rfl: 3   psyllium (METAMUCIL) 58.6 % packet, Take 1 packet by mouth daily., Disp: 30 each, Rfl: 2   torsemide (DEMADEX) 20 MG tablet, Take 20 mg by mouth daily., Disp: , Rfl:    traMADol (ULTRAM-ER) 100 MG 24 hr tablet, Take 1 tablet (100 mg total) by mouth daily., Disp: 30 tablet, Rfl: 2   Allergies  Allergen Reactions   Codeine Other (See Comments)     Alt Ment Status (per notation in 2013)    ROS Review of Systems  Respiratory:  Positive for shortness of breath. Negative for cough, chest tightness and wheezing.   Cardiovascular:  Negative for chest pain.  Gastrointestinal:  Negative for abdominal distention.  Neurological:  Negative for facial asymmetry and speech difficulty.     Objective:    Physical Exam Vitals reviewed.  Constitutional:      Appearance: Normal appearance.  HENT:     Mouth/Throat:     Mouth: Mucous membranes are moist.  Eyes:     Pupils: Pupils are equal, round, and reactive to light.  Neck:     Vascular: No carotid bruit.  Cardiovascular:     Rate and Rhythm: Normal rate.     Pulses: Normal pulses.     Heart sounds: Normal heart sounds.  Pulmonary:     Effort:  Pulmonary effort is normal.     Breath sounds: Normal breath sounds.  Abdominal:     General: Bowel sounds are normal.     Palpations: Abdomen is soft. There is no hepatomegaly, splenomegaly or mass.     Tenderness: There is no abdominal tenderness.     Hernia: No hernia is present.  Musculoskeletal:        General: No tenderness.     Cervical back: Neck supple.  Right lower leg: No edema.     Left lower leg: No edema.     Comments: Patient has multiple joint complaints because of osteoarthritis.  She is seeing the orthopedic surgeon and get shots intermittently.  Skin:    Findings: No rash.  Neurological:     Mental Status: She is alert and oriented to person, place, and time.     Motor: No weakness.  Psychiatric:        Mood and Affect: Affect normal.    There were no vitals taken for this visit. Wt Readings from Last 3 Encounters:  04/06/21 121 lb (54.9 kg)  12/27/20 121 lb 12.8 oz (55.2 kg)  12/05/20 135 lb 11.2 oz (61.6 kg)     Health Maintenance Due  Topic Date Due   Zoster Vaccines- Shingrix (1 of 2) Never done   INFLUENZA VACCINE  02/20/2021   TETANUS/TDAP  03/13/2021   COVID-19 Vaccine (5 - Booster for Pfizer series) 04/14/2021   Pneumonia Vaccine 58+ Years old (3 - PCV) 06/23/2021    There are no preventive care reminders to display for this patient.  Lab Results  Component Value Date   TSH 1.84 05/23/2013   Lab Results  Component Value Date   WBC 9.7 03/11/2021   HGB 12.4 03/11/2021   HCT 38.1 03/11/2021   MCV 92.5 03/11/2021   PLT 294 03/11/2021   Lab Results  Component Value Date   NA 140 03/12/2021   K 3.6 03/12/2021   CO2 32 03/12/2021   GLUCOSE 104 (H) 03/12/2021   BUN 30 (H) 03/12/2021   CREATININE 1.06 (H) 03/12/2021   BILITOT 0.8 03/12/2021   ALKPHOS 77 03/12/2021   AST 20 03/12/2021   ALT 17 03/12/2021   PROT 5.6 (L) 03/12/2021   ALBUMIN 3.2 (L) 03/12/2021   CALCIUM 8.8 (L) 03/12/2021   ANIONGAP 4 (L) 03/12/2021   Lab  Results  Component Value Date   CHOL 214 (H) 04/11/2013   Lab Results  Component Value Date   HDL 65 (H) 04/11/2013   Lab Results  Component Value Date   LDLCALC 131 (H) 04/11/2013   Lab Results  Component Value Date   TRIG 89 04/11/2013   No results found for: CHOLHDL No results found for: HGBA1C    Assessment & Plan:   Note: Medical Device Follow-up  Patient pacemaker was interrogated by pacemakers analyzer, battery status is okay.  No programming changes were indicated after the review of the data.  Histogram shows no change since the last interrogation Atrial and ventricular sensing thresholds were found to be acceptable Impedance was checked and it was found to be normal.  Thresholds were found to be okay on evaluation of rhythm problem.  No high rate or low rate arrhythmia were noted.  Estimated battery longevity is 20 months. I have personally reviewed the device data and amended the report as necessary.      No orders of the defined types were placed in this encounter.   Follow-up: No follow-ups on file.    Cletis Athens, MD

## 2021-05-29 NOTE — Assessment & Plan Note (Signed)
Pacemaker is functioning well.

## 2021-05-29 NOTE — Assessment & Plan Note (Signed)
Pacemaker is working well 

## 2021-05-29 NOTE — Assessment & Plan Note (Signed)
Chronic problem. 

## 2021-05-29 NOTE — Assessment & Plan Note (Signed)
Stable on today's examination

## 2021-05-30 ENCOUNTER — Other Ambulatory Visit: Payer: Self-pay | Admitting: Internal Medicine

## 2021-05-30 DIAGNOSIS — Z1231 Encounter for screening mammogram for malignant neoplasm of breast: Secondary | ICD-10-CM

## 2021-05-31 NOTE — Telephone Encounter (Signed)
Error

## 2021-06-06 ENCOUNTER — Other Ambulatory Visit: Payer: Self-pay | Admitting: *Deleted

## 2021-06-06 MED ORDER — DOCUSATE SODIUM 100 MG PO CAPS: 100.0000 mg | ORAL_CAPSULE | Freq: Two times a day (BID) | ORAL | 0 refills | Status: DC

## 2021-06-08 ENCOUNTER — Telehealth: Payer: Self-pay

## 2021-06-08 NOTE — Telephone Encounter (Signed)
The patient left a vm saying someone called her and she is hard of hearing so she couldn't understand the message. I cant figure out who called her. She wants them to call her back.

## 2021-06-08 NOTE — Telephone Encounter (Signed)
Attempted to reach patient, no answer and no voicemail.  I can't find a reason that we would have called her.

## 2021-06-13 ENCOUNTER — Encounter: Payer: Medicare Other | Admitting: Student in an Organized Health Care Education/Training Program

## 2021-06-27 ENCOUNTER — Ambulatory Visit
Payer: Medicare Other | Attending: Student in an Organized Health Care Education/Training Program | Admitting: Student in an Organized Health Care Education/Training Program

## 2021-06-27 ENCOUNTER — Other Ambulatory Visit: Payer: Self-pay

## 2021-06-27 ENCOUNTER — Encounter: Payer: Self-pay | Admitting: Student in an Organized Health Care Education/Training Program

## 2021-06-27 VITALS — BP 138/57 | HR 63 | Temp 97.0°F | Resp 16 | Ht 60.0 in | Wt 126.4 lb

## 2021-06-27 DIAGNOSIS — M1711 Unilateral primary osteoarthritis, right knee: Secondary | ICD-10-CM

## 2021-06-27 DIAGNOSIS — M19011 Primary osteoarthritis, right shoulder: Secondary | ICD-10-CM | POA: Diagnosis present

## 2021-06-27 DIAGNOSIS — S46001S Unspecified injury of muscle(s) and tendon(s) of the rotator cuff of right shoulder, sequela: Secondary | ICD-10-CM | POA: Diagnosis present

## 2021-06-27 DIAGNOSIS — G894 Chronic pain syndrome: Secondary | ICD-10-CM | POA: Diagnosis present

## 2021-06-27 DIAGNOSIS — M19111 Post-traumatic osteoarthritis, right shoulder: Secondary | ICD-10-CM | POA: Diagnosis present

## 2021-06-27 DIAGNOSIS — M170 Bilateral primary osteoarthritis of knee: Secondary | ICD-10-CM

## 2021-06-27 DIAGNOSIS — M1712 Unilateral primary osteoarthritis, left knee: Secondary | ICD-10-CM | POA: Diagnosis present

## 2021-06-27 MED ORDER — TRAMADOL HCL ER 200 MG PO TB24: 200.0000 mg | ORAL_TABLET | Freq: Every day | ORAL | 2 refills | Status: DC

## 2021-06-27 MED ORDER — TRAMADOL HCL 50 MG PO TABS: 50.0000 mg | ORAL_TABLET | Freq: Two times a day (BID) | ORAL | 2 refills | Status: DC

## 2021-06-27 NOTE — Progress Notes (Signed)
PROVIDER NOTE: Information contained herein reflects review and annotations entered in association with encounter. Interpretation of such information and data should be left to medically-trained personnel. Information provided to patient can be located elsewhere in the medical record under "Patient Instructions". Document created using STT-dictation technology, any transcriptional errors that may result from process are unintentional.    Patient: Sue Knapp  Service Category: E/M  Provider: Gillis Santa, MD  DOB: 11-04-1922  DOS: 06/27/2021  Specialty: Interventional Pain Management  MRN: [id number]  Setting: Ambulatory outpatient  PCP: Cletis Athens, MD  Type: Established Patient    Referring Provider: Cletis Athens, MD  Location: Office  Delivery: Face-to-face     HPI  Sue Knapp, a 87 y.o. year old female, is here today because of her Primary osteoarthritis of right knee [M17.11]. Ms. Geisinger primary complain today is Shoulder Pain (bilateral) and Knee Pain (bilateral) Pain Assessment: Severity of Chronic pain is reported as a 10-Worst pain ever/10. Location: Shoulder Right, Left/elbow pasin also is new. Onset: More than a month ago (new for elbows bilateral). Quality: Aching, Constant. Timing: Constant. Modifying factor(s): meds when given. Vitals:  height is 5' (1.524 m) and weight is 126 lb 6.4 oz (57.3 kg). Her temporal temperature is 97 F (36.1 C) (abnormal). Her blood pressure is 138/57 (abnormal) and her pulse is 63. Her respiration is 16 and oxygen saturation is 99%.   Reason for encounter: medication management.   Patient resides in a nursing facility.  She states that over the last 2 months she has struggled with increased shoulder pain and bilateral knee pain.  We discussed increasing her tramadol ER to 200 mg daily.  We also discussed making her tramadol 50 mg immediate release scheduled for 12 PM and 7 PM for preemptive analgesia.    ROS  Constitutional: Denies  any fever or chills Gastrointestinal: No reported hemesis, hematochezia, vomiting, or acute GI distress Musculoskeletal:  Diffuse arthralgias and myalgias: Most pronounced in her knees and shoulders Neurological: No reported episodes of acute onset apraxia, aphasia, dysarthria, agnosia, amnesia, paralysis, loss of coordination, or loss of consciousness  Medication Review  ALPRAZolam, COVID-19 mRNA [employer]), acetaminophen, albuterol, apixaban, diclofenac Sodium, docusate sodium, furosemide, metoprolol succinate, nystatin, omeprazole, pregabalin, psyllium, torsemide, and traMADol  History Review  Allergy: [name] is allergic to codeine. Drug: [name]  reports current drug use. Alcohol:  reports no history of alcohol use. Tobacco:  reports that she has never smoked. She has never used smokeless tobacco. Social: [name]  reports that she has never smoked. She has never used smokeless tobacco. She reports current drug use. She reports that she does not drink alcohol. Medical:  has a past medical history of Arthritis, Breast cancer ([place]) (2013), Cataract, Fatigue, GERD (gastroesophageal reflux disease), Glaucoma, Heart murmur, Heart palpitations, Hiatal hernia, HOH (hard of hearing), Hyperlipidemia, Hypertension, Hypothyroidism, Left ankle injury, Left bundle branch block, Neuropathy, Osteoporosis, Pacemaker, Palpitations, Personal history of radiation therapy (2013), Scoliosis, Shortness of breath dyspnea, Thyroid nodule, and TIA (transient ischemic attack). Surgical: [name]  has a past surgical history that includes Thyroidectomy, partial; Abdominal hysterectomy; Breast surgery; Cataract extraction w/PHACO (Right, 03/01/2015); Cataract extraction w/PHACO (Left, 03/22/2015); Mastectomy partial / lumpectomy; Breast lumpectomy (Left, 02/21/2012); Breast biopsy (Left, 2005); Breast biopsy (Left, 2013); Eye surgery (09/2020 and 10/2020); Esophagogastroduodenoscopy (egd) with propofol (N/A,  12/04/2020); and Esophagogastroduodenoscopy (egd) with propofol (N/A, 12/04/2020). Family: family history is not on file.  Laboratory Chemistry Profile   Renal Lab Results  Component  Value Date   BUN 30 (H) 03/12/2021   CREATININE 1.06 (H) 03/12/2021   BCR 31 (H) 11/28/2020   GFRAA 37 (L) 11/28/2020   GFRNONAA 48 (L) 03/12/2021     Hepatic Lab Results  Component Value Date   AST 20 03/12/2021   ALT 17 03/12/2021   ALBUMIN 3.2 (L) 03/12/2021   ALKPHOS 77 03/12/2021   LIPASE 36 09/14/2019     Electrolytes Lab Results  Component Value Date   NA 140 03/12/2021   K 3.6 03/12/2021   CL 104 03/12/2021   CALCIUM 8.8 (L) 03/12/2021   MG 1.9 12/04/2020     Bone No results found for: VD25OH, VD125OH2TOT, QZ0092ZR0, QT6226JF3, 25OHVITD1, 25OHVITD2, 25OHVITD3, TESTOFREE, TESTOSTERONE   Inflammation (CRP: Acute Phase) (ESR: Chronic Phase) No results found for: CRP, ESRSEDRATE, LATICACIDVEN     Note: Above Lab results reviewed.  Recent Imaging Review  CT HEAD WO CONTRAST (5MM) CLINICAL DATA:  Altered mental status  EXAM: CT HEAD WITHOUT CONTRAST  TECHNIQUE: Contiguous axial images were obtained from the base of the skull through the vertex without intravenous contrast.  COMPARISON:  11/04/2020  FINDINGS: Brain: There is no mass, hemorrhage or extra-axial collection. There is generalized atrophy without lobar predilection. Hypodensity of the white matter is most commonly associated with chronic microvascular disease.  Vascular: Atherosclerotic calcification of the vertebral and internal carotid arteries at the skull base. No abnormal hyperdensity of the major intracranial arteries or dural venous sinuses.  Skull: The visualized skull base, calvarium and extracranial soft tissues are normal.  Sinuses/Orbits: No fluid levels or advanced mucosal thickening of the visualized paranasal sinuses. No mastoid or middle ear effusion. The orbits are  normal.  IMPRESSION: Generalized atrophy and chronic microvascular ischemia without acute intracranial abnormality.  Electronically Signed   By: Ulyses Jarred M.D.   On: 03/12/2021 02:56 Note: Reviewed        Physical Exam  General appearance: Well nourished, well developed, and well hydrated. In no apparent acute distress Mental status: Alert, oriented x 3 (person, place, & time)       Respiratory: No evidence of acute respiratory distress Eyes: PERLA Vitals: BP (!) 138/57 (BP Location: Right Arm, Patient Position: Sitting, Cuff Size: Normal)   Pulse 63   Temp (!) 97 F (36.1 C) (Temporal)   Resp 16   Ht 5' (1.524 m)   Wt 126 lb 6.4 oz (57.3 kg)   SpO2 99%   BMI 24.69 kg/m  BMI: Estimated body mass index is 24.69 kg/m as calculated from the following:   Height as of this encounter: 5' (1.524 m).   Weight as of this encounter: 126 lb 6.4 oz (57.3 kg). Ideal: Ideal body weight: 45.5 kg (100 lb 4.9 oz) Adjusted ideal body weight: 50.2 kg (110 lb 11.9 oz)  Lumbar Spine Area Exam  Skin & Axial Inspection: No masses, redness, or swelling Alignment: Symmetrical Functional ROM: Pain restricted ROM       Stability: No instability detected Muscle Tone/Strength: Functionally intact. No obvious neuro-muscular anomalies detected. Sensory (Neurological): Musculoskeletal pain pattern   Gait & Posture Assessment  Ambulation: Patient came in today in a wheel chair Gait: Limited. Using assistive device to ambulate Posture: Difficulty standing up straight, due to pain    Lower Extremity Exam      Side: Right lower extremity   Side: Left lower extremity  Stability: No instability observed           Stability: No instability observed  Skin & Extremity Inspection: brace in place   Skin & Extremity Inspection: Atrophy  Functional ROM: Pain restricted ROM for knee joint           Functional ROM: Pain restricted ROM for knee joint          Muscle Tone/Strength: Functionally  intact. No obvious neuro-muscular anomalies detected.   Muscle Tone/Strength: Functionally intact. No obvious neuro-muscular anomalies detected.  Sensory (Neurological): Arthropathic arthralgia         Sensory (Neurological): Arthropathic arthralgia         Assessment   Status Diagnosis  Worsening Worsening Worsening 1. Primary osteoarthritis of right knee   2. Primary osteoarthritis of left knee   3. Osteoarthritis of right shoulder due to rotator cuff injury   4. Osteoarthritis of right shoulder, unspecified osteoarthritis type   5. Bilateral primary osteoarthritis of knee   6. Chronic pain syndrome       Plan of Care   Ms. EMMERSEN GARRAWAY has a current medication list which includes the following long-term medication(s): albuterol, eliquis, furosemide, metoprolol succinate, omeprazole, pregabalin, torsemide, and omeprazole.  Pharmacotherapy (Medications Ordered): Meds ordered this encounter  Medications   DISCONTD: traMADol (ULTRAM-ER) 200 MG 24 hr tablet    Sig: Take 1 tablet (200 mg total) by mouth daily.    Dispense:  30 tablet    Refill:  2   DISCONTD: traMADol (ULTRAM) 50 MG tablet    Sig: Take 1 tablet (50 mg total) by mouth 2 (two) times daily.    Dispense:  60 tablet    Refill:  2    At 12 pm and 7 pm   traMADol (ULTRAM) 50 MG tablet    Sig: Take 1 tablet (50 mg total) by mouth 2 (two) times daily.    Dispense:  60 tablet    Refill:  2    At 12 pm and 7 pm   traMADol (ULTRAM-ER) 200 MG 24 hr tablet    Sig: Take 1 tablet (200 mg total) by mouth daily.    Dispense:  30 tablet    Refill:  2    Follow-up plan:   Return in about 3 months (around 09/25/2021) for Medication Management, in person.     Status post right knee genicular nerve block 1 on 03/09/2019.  S/p right shoulder steroid injection 03/23/19. 05/04/2019:  R Genicular RFA- not effective.        Recent Visits Date Type Provider Dept  04/06/21 Office Visit Gillis Santa, MD [facility]   Showing recent visits within past 90 days and meeting all other requirements Today's Visits Date Type Provider Dept  06/27/21 Office Visit Gillis Santa, MD [facility]  Showing today's visits and meeting all other requirements Future Appointments Date Type Provider Dept  09/19/21 Appointment Gillis Santa, MD [facility]  Showing future appointments within next 90 days and meeting all other requirements I discussed the assessment and treatment plan with the patient. The patient was provided an opportunity to ask questions and all were answered. The patient agreed with the plan and demonstrated an understanding of the instructions.  Patient advised to call back or seek an in-person evaluation if the symptoms or condition worsens.  Duration of encounter: 82mnutes.  Note by: BGillis Santa MD Date: 06/27/2021; Time: 12:49 PM

## 2021-06-27 NOTE — Progress Notes (Signed)
Safety precautions to be maintained throughout the outpatient stay will include: orient to surroundings, keep bed in low position, maintain call bell within reach at all times, provide assistance with transfer out of bed and ambulation.   Did not bring medication. Patient lives at a home.

## 2021-06-28 ENCOUNTER — Telehealth: Payer: Self-pay | Admitting: Student in an Organized Health Care Education/Training Program

## 2021-06-28 NOTE — Telephone Encounter (Signed)
I called this pharmacy, they do not have this patient in the system. I called Ms. Ulibarri's daughter Arbie Cookey, she will find out the correct pharmacy and call me back.

## 2021-06-28 NOTE — Telephone Encounter (Signed)
[facility], they are questioning patient taking both Tramadol and Tramadol ER due to patient's age. I verified with Dr. Holley Raring, ok to proceed with both these scripts.

## 2021-07-11 ENCOUNTER — Other Ambulatory Visit: Payer: Self-pay

## 2021-07-11 ENCOUNTER — Ambulatory Visit (INDEPENDENT_AMBULATORY_CARE_PROVIDER_SITE_OTHER): Payer: Medicare Other | Admitting: *Deleted

## 2021-07-11 DIAGNOSIS — Z0000 Encounter for general adult medical examination without abnormal findings: Secondary | ICD-10-CM

## 2021-07-11 DIAGNOSIS — Z23 Encounter for immunization: Secondary | ICD-10-CM

## 2021-07-11 MED ORDER — ZOSTER VAC RECOMB ADJUVANTED 50 MCG/0.5ML IM SUSR: 0.5000 mL | Freq: Once | INTRAMUSCULAR | 1 refills | Status: AC

## 2021-07-11 MED ORDER — DOCUSATE SODIUM 100 MG PO CAPS: 100.0000 mg | ORAL_CAPSULE | Freq: Two times a day (BID) | ORAL | 0 refills | Status: DC

## 2021-07-11 NOTE — Progress Notes (Signed)
Subjective:   Sue Knapp is a 87 y.o. female who presents for Medicare Annual (Subsequent) preventive examination.   Patient:in office  Provider:in office   Review of Systems    Defer to provider  Cardiac Risk Factors include: advanced age (>8men, >88 women)     Objective:    Today's Vitals   07/11/21 1006  PainSc: 10-Worst pain ever   There is no height or weight on file to calculate BMI.  Advanced Directives 07/11/2021 03/11/2021 12/04/2020 12/04/2020 12/01/2020 12/01/2020 11/04/2020  Does Patient Have a Medical Advance Directive? No No Yes Yes Yes Yes Yes  Type of Advance Directive - Programmer, multimedia of [employer] Power of [place];Living will Living will Living will  Does patient want to make changes to medical advance directive? - - - - - No - Patient declined -  Copy of [place] in Chart? - - No - copy requested No - copy requested No - copy requested - -  Would patient like information on creating a medical advance directive? No - Patient declined - - - - - -    Current Medications (verified) Outpatient Encounter Medications as of 07/11/2021  Medication Sig   acetaminophen (TYLENOL) 650 MG CR tablet Take 2 tablets (1,300 mg total) by mouth 2 (two) times daily as needed for pain.   albuterol (VENTOLIN HFA) 108 (90 Base) MCG/ACT inhaler Inhale 2 puffs into the lungs every 6 (six) hours as needed for wheezing or shortness of breath.   ALPRAZolam (XANAX) 0.25 MG tablet Take 1 tablet by mouth daily as needed.   Dentifrices (BIOTENE DRY MOUTH DT) Place onto teeth.   diclofenac Sodium (VOLTAREN) 1 % GEL Apply topically.   docusate sodium (COLACE) 100 MG capsule Take 1 capsule (100 mg total) by mouth 2 (two) times daily.   ELIQUIS 2.5 MG TABS tablet TAKE 1 TABLET BY MOUTH TWICE DAILY   furosemide (LASIX) 20 MG tablet Take 1 tablet (20 mg total) by mouth daily.   metoprolol succinate (TOPROL-XL) 50 MG 24 hr tablet  TAKE 1 TABLET BY MOUTH DAILY   NYAMYC powder Apply topically.   omeprazole (PRILOSEC) 20 MG capsule Take 20 mg by mouth daily.   pregabalin (LYRICA) 50 MG capsule TAKE 1 CAPSULE 3 TIMES DAILY (Patient taking differently: Take 50 mg by mouth 3 (three) times daily.)   psyllium (METAMUCIL) 58.6 % packet Take 1 packet by mouth daily.   torsemide (DEMADEX) 20 MG tablet Take 20 mg by mouth daily.   traMADol (ULTRAM) 50 MG tablet Take 1 tablet (50 mg total) by mouth 2 (two) times daily.   traMADol (ULTRAM-ER) 200 MG 24 hr tablet Take 1 tablet (200 mg total) by mouth daily.   trolamine salicylate (ASPERCREME/ALOE) 10 % cream Apply 1 application topically as needed for muscle pain.   omeprazole (PRILOSEC OTC) 20 MG tablet Take 1 tablet (20 mg total) by mouth daily.   [DISCONTINUED] COVID-19 mRNA Vac-TriS, Pfizer, (PFIZER-BIONT COVID-19 VAC-TRIS) SUSP injection Inject into the muscle. (Patient not taking: Reported on 06/27/2021)   No facility-administered encounter medications on file as of 07/11/2021.    Allergies (verified) Codeine   History: Past Medical History:  Diagnosis Date   Arthritis    Breast cancer ([place]) 2013   left breast, radiation and lumpectomy   Cataract    Fatigue    GERD (gastroesophageal reflux disease)    Glaucoma    Heart murmur    Heart palpitations  Hiatal hernia    HOH (hard of hearing)    AIDS   Hyperlipidemia    Hypertension    Hypothyroidism    Left ankle injury    Old left ankle injury   Left bundle branch block    Neuropathy    Osteoporosis    Pacemaker    Palpitations    Personal history of radiation therapy 2013   left breast ca   Scoliosis    Shortness of breath dyspnea    Thyroid nodule    TIA (transient ischemic attack)    Past Surgical History:  Procedure Laterality Date   ABDOMINAL HYSTERECTOMY     BREAST BIOPSY Left 2005   b[place]in   BREAST BIOPSY Left 2013   stereo bx, invasive tubular carcinoma and DCIS   BREAST LUMPECTOMY Left  02/21/2012   invasive tubular carcinoma and DCIS, clear margins, 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   BREAST SURGERY     LUMPECTOMY   CATARACT EXTRACTION W/PHACO Right 03/01/2015   Procedure: CATARACT EXTRACTION PHACO AND INTRAOCULAR LENS PLACEMENT ([place]);  Surgeon: Birder Robson, MD;  Location: [facility];  Service: Ophthalmology;  Laterality: Right;  US:1:03.1 AP:22.8 CDE:14.37  Fluid lot # [id number]   CATARACT EXTRACTION W/PHACO Left 03/22/2015   Procedure: CATARACT EXTRACTION PHACO AND INTRAOCULAR LENS PLACEMENT (IOC);  Surgeon: Birder Robson, MD;  Location: [facility];  Service: Ophthalmology;  Laterality: Left;  [place]: 01:01.0 AP%: 22.5% CDE: 13.75 Lot # [id number]   ESOPHAGOGASTRODUODENOSCOPY (EGD) WITH PROPOFOL N/A 12/04/2020   Procedure: ESOPHAGOGASTRODUODENOSCOPY (EGD) WITH PROPOFOL;  Surgeon: Lin Landsman, MD;  Location: [place];  Service: Gastroenterology;  Laterality: N/A;   ESOPHAGOGASTRODUODENOSCOPY (EGD) WITH PROPOFOL N/A 12/04/2020   Procedure: ESOPHAGOGASTRODUODENOSCOPY (EGD) WITH PROPOFOL;  Surgeon: Lin Landsman, MD;  Location: [facility] END[facility]COPY;  Service: Gastroenterology;  Laterality: N/A;   EYE SURGERY  09/2020 and 10/2020   MASTECTOMY PARTIAL / LUMPECTOMY     THYROIDECTOMY, PARTIAL     Family History  Problem Relation Age of Onset   Breast cancer Neg Hx    Social History   Socioeconomic History   Marital status: Widowed    Spouse name: Not on file   Number of children: 3   Years of education: Not on file   Highest education level: High school graduate  Occupational History   Not on file  Tobacco Use   Smoking status: Never   Smokeless tobacco: Never  Substance and Sexual Activity   Alcohol use: No   Drug use: Never    Comment: pain clinic: tramadol   Sexual activity: Not Currently  Other Topics Concern   Not on file  Social History Narrative   Not on file   Social Determinants of Health   Financial Resource Strain: Low Risk    Difficulty of Paying Living  Expenses: Not hard at all  Food Insecurity: No Food Insecurity   Worried About Charity fundraiser in the Last Year: Never true   [place] in the Last Year: Never true  Transportation Needs: No Transportation Needs   Lack of Transportation (Medical): No   Lack of Transportation (Non-Medical): No  Physical Activity: Inactive   Days of Exercise per Week: 0 days   Minutes of Exercise per Session: 0 min  Stress: Stress Concern Present   Feeling of Stress : To some extent  Social Connections: Moderately Integrated   Frequency of Communication with Friends and Family: More than three times a week   Frequency of Social Gatherings with Friends  and Family: More than three times a week   Attends Religious Services: More than 4 times per year   Active Member of Clubs or Organizations: Yes   Attends Archivist Meetings: More than 4 times per year   Marital Status: Widowed    Tobacco Counseling Counseling given: Not Answered   Clinical Intake:  Pre-visit preparation completed: Yes  Pain : 0-10 Pain Score: 10-Worst pain ever Pain Type: Chronic pain Pain Location: Shoulder Pain Orientation: Right, Left Pain Descriptors / Indicators: Aching, Constant Pain Frequency: Constant Pain Relieving Factors: pain meds when given Effect of Pain on Daily Activities: has issues getting up due to knee pain  Pain Relieving Factors: pain meds when given  Nutritional Risks: None Diabetes: No  How often do you need to have someone help you when you read instructions, pamphlets, or other written materials from your doctor or pharmacy?: 2 - Rarely What is the last grade level you completed in school?: high school graduate  Diabetic?no   Interpreter Needed?: No  Information entered by :: Lacretia Nicks, [place] of Daily Living In your present state of health, do you have any difficulty performing the following activities: 07/11/2021 12/01/2020  Hearing? [place]? N -   Difficulty concentrating or making decisions? Y -  Walking or climbing stairs? Y -  Dressing or bathing? Y -  Doing errands, shopping? [place] Donning  Preparing Food and eating ? Y -  Using the Toilet? N -  In the past six months, have you accidently leaked urine? N -  Do you have problems with loss of bowel control? N -  Managing your Medications? N -  Managing your Finances? N -  Housekeeping or managing your Housekeeping? Y -  Some recent data might be hidden    Patient Care Team: Cletis Athens, MD as PCP - General (Internal Medicine)  Indicate any recent Medical Services you may have received from other than [facility] providers in the past year (date may be approximate).     Assessment:   This is a routine wellness examination for [place].  Hearing/Vision screen No results found.  Dietary issues and exercise activities discussed: Current Exercise Habits: The patient does not participate in regular exercise at present, Exercise limited by: orthopedic condition(s)   Goals Addressed   None    Depression Screen PHQ 2/9 Scores 07/11/2021 06/27/2021 05/10/2020 03/09/2019 03/03/2019  PHQ - 2 Score 0 0 0 0 0  PHQ- 9 Score 0 - - - -    Fall Risk Fall Risk  07/11/2021 11/03/2020 05/10/2020 05/04/2019 03/23/2019  Falls in the past year? 1 0 0 0 0  Number falls in past yr: 0 - 0 - -  Injury with Fall? 1 - 0 - -  Risk for fall due to : History of fall(s) - - - -  Follow up Falls evaluation completed - - Falls evaluation completed -    FALL RISK PREVENTION PERTAINING TO THE HOME:  Any stairs in or around the home? No  If so, are there any without handrails? No  Home free of loose throw rugs in walkways, pet beds, electrical cords, etc? Yes  Adequate lighting in your home to reduce risk of falls? Yes   ASSISTIVE DEVICES UTILIZED TO PREVENT FALLS:  Life alert? Yes  Use of a cane, walker or w/c? Yes  Grab bars in the bathroom? Yes  Shower chair or bench in shower? Yes  Elevated toilet  seat or a  handicapped toilet? Yes   TIMED UP AND GO:  Was the test performed? No .   Gait steady and fast without use of assistive device  Cognitive Function: MMSE - Mini Mental State Exam 07/11/2021  Not completed: Unable to complete     6CIT Screen 07/11/2021  What Year? 0 points  What month? 0 points  What time? 0 points  Count back from 20 0 points  Months in reverse 2 points  Repeat phrase 2 points  Total Score 4    Immunizations Immunization History  Administered Date(s) Administered   Influenza-Unspecified 04/11/2020   PFIZER Comirnaty(Gray Top)Covid-19 Tri-Sucrose Vaccine 02/17/2021   PFIZER(Purple Top)SARS-COV-2 Vaccination 07/30/2019, 08/26/2019, 04/28/2020   Pneumococcal Polysaccharide-23 05/11/2015, 06/23/2020   Tdap 03/14/2011   Zoster, Live 05/11/2011    TDAP status: Patient wishes to receive this vaccine today after disclosing that insurance does not cover the vaccine as a preventative vaccine.   Flu Vaccine status: Up to date  Pneumococcal vaccine status: Up to date  Covid-19 vaccine status: Completed vaccines  Qualifies for Shingles Vaccine? Yes   Zostavax completed Yes   Shingrix Completed?: No.    Education has been provided regarding the importance of this vaccine. Patient has been advised to call insurance company to determine out of pocket expense if they have not yet received this vaccine. Advised may also receive vaccine at local pharmacy or Health Dept. Verbalized acceptance and understanding.  Screening Tests Health Maintenance  Topic Date Due   Zoster Vaccines- Shingrix (1 of 2) Never done   INFLUENZA VACCINE  02/20/2021   TETANUS/TDAP  03/13/2021   COVID-19 Vaccine (5 - Booster for Pfizer series) 04/14/2021   Pneumonia Vaccine 90+ Years old (3 - PCV) 06/23/2021   DEXA SCAN  Completed   HPV VACCINES  Aged Out    Health Maintenance  Health Maintenance Due  Topic Date Due   Zoster Vaccines- Shingrix (1 of 2) Never done    INFLUENZA VACCINE  02/20/2021   TETANUS/TDAP  03/13/2021   COVID-19 Vaccine (5 - Booster for Pfizer series) 04/14/2021   Pneumonia Vaccine 56+ Years old (3 - PCV) 06/23/2021    Colorectal cancer screening: No longer required.   Mammogram status: Completed 02/2020. Repeat every year- has an apt for mammogram 07/26/2021    Lung Cancer Screening: (Low Dose CT Chest recommended if Age 57-80 years, 30 pack-year currently smoking OR have quit w/in 15years.) does not qualify.   Lung Cancer Screening Referral: NA   Additional Screening:  Hepatitis C Screening: does not qualify   Vision Screening: Recommended annual ophthalmology exams for early detection of glaucoma and other disorders of the eye. Is the patient up to date with their annual eye exam?  Yes  Who is the provider or what is the name of the office in which the patient attends annual eye exams? [facility]  If pt is not established with a provider, would they like to be referred to a provider to establish care?  Patient is already established  .   Dental Screening: Recommended annual dental exams for proper oral hygiene  Community Resource Referral / Chronic Care Management: CRR required this visit?  No   CCM required this visit?  No      Plan:     I have personally reviewed and noted the following in the patients chart:   Medical and social history Use of alcohol, tobacco or illicit drugs  Current medications and supplements including opioid prescriptions.  Functional ability and status  Nutritional status Physical activity Advanced directives List of other physicians Hospitalizations, surgeries, and ER visits in previous 12 months Vitals Screenings to include cognitive, depression, and falls Referrals and appointments  In addition, I have reviewed and discussed with patient certain preventive protocols, quality metrics, and best practice recommendations. A written personalized care plan for preventive services  as well as general preventive health recommendations were provided to patient.     Lacretia Nicks, [place]   07/11/2021   Nurse Notes:  Sue Knapp , Thank you for taking time to come for your Medicare Wellness Visit. I appreciate your ongoing commitment to your health goals. Please review the following plan we discussed and let me know if I can assist you in the future.   These are the goals we discussed:  Goals   None     This is a list of the screening recommended for you and due dates:  Health Maintenance  Topic Date Due   Zoster (Shingles) Vaccine (1 of 2) Never done   Pneumonia Vaccine (3 - PCV) 06/23/2021   Tetanus Vaccine  07/12/2031   Flu Shot  Completed   DEXA scan (bone density measurement)  Completed   COVID-19 Vaccine  Completed   HPV Vaccine  Aged Out      Time spent 45 min

## 2021-07-14 DIAGNOSIS — Z789 Other specified health status: Secondary | ICD-10-CM | POA: Insufficient documentation

## 2021-07-14 LAB — CBC WITH DIFFERENTIAL/PLATELET
Absolute Monocytes: 640 cells/uL (ref 200–950)
Basophils Absolute: 79 cells/uL (ref 0–200)
Basophils Relative: 1.2 %
Eosinophils Absolute: 172 cells/uL (ref 15–500)
Eosinophils Relative: 2.6 %
HCT: 35.6 % (ref 35.0–45.0)
Hemoglobin: 11.5 g/dL — ABNORMAL LOW (ref 11.7–15.5)
Lymphs Abs: 970 cells/uL (ref 850–3900)
MCH: 29.6 pg (ref 27.0–33.0)
MCHC: 32.3 g/dL (ref 32.0–36.0)
MCV: 91.5 fL (ref 80.0–100.0)
MPV: 9.9 fL (ref 7.5–12.5)
Monocytes Relative: 9.7 %
Neutro Abs: 4739 cells/uL (ref 1500–7800)
Neutrophils Relative %: 71.8 %
Platelets: 344 10*3/uL (ref 140–400)
RBC: 3.89 10*6/uL (ref 3.80–5.10)
RDW: 13.5 % (ref 11.0–15.0)
Total Lymphocyte: 14.7 %
WBC: 6.6 10*3/uL (ref 3.8–10.8)

## 2021-07-14 LAB — COMPLETE METABOLIC PANEL WITH GFR
AG Ratio: 1.7 (calc) (ref 1.0–2.5)
ALT: 20 U/L (ref 6–29)
AST: 26 U/L (ref 10–35)
Albumin: 3.9 g/dL (ref 3.6–5.1)
Alkaline phosphatase (APISO): 127 U/L (ref 37–153)
BUN/Creatinine Ratio: 28 (calc) — ABNORMAL HIGH (ref 6–22)
BUN: 32 mg/dL — ABNORMAL HIGH (ref 7–25)
CO2: 31 mmol/L (ref 20–32)
Calcium: 9.4 mg/dL (ref 8.6–10.4)
Chloride: 100 mmol/L (ref 98–110)
Creat: 1.13 mg/dL — ABNORMAL HIGH (ref 0.60–0.95)
Globulin: 2.3 g/dL (calc) (ref 1.9–3.7)
Glucose, Bld: 100 mg/dL — ABNORMAL HIGH (ref 65–99)
Potassium: 4.1 mmol/L (ref 3.5–5.3)
Sodium: 141 mmol/L (ref 135–146)
Total Bilirubin: 1 mg/dL (ref 0.2–1.2)
Total Protein: 6.2 g/dL (ref 6.1–8.1)
eGFR: 45 mL/min/{1.73_m2} — ABNORMAL LOW (ref >=60)

## 2021-07-14 LAB — LIPID PANEL
Cholesterol: 183 mg/dL (ref <200)
HDL: 46 mg/dL — ABNORMAL LOW (ref >=50)
LDL Cholesterol (Calc): 118 mg/dL (calc) — ABNORMAL HIGH
Non-HDL Cholesterol (Calc): 137 mg/dL (calc) — ABNORMAL HIGH (ref <130)
Total CHOL/HDL Ratio: 4 (calc) (ref <5.0)
Triglycerides: 86 mg/dL (ref <150)

## 2021-07-14 LAB — TSH: TSH: 4.12 mIU/L (ref 0.40–4.50)

## 2021-07-22 NOTE — Progress Notes (Signed)
I have reviewed this visit and agree with the documentation.   

## 2021-07-26 ENCOUNTER — Ambulatory Visit
Admission: RE | Admit: 2021-07-26 | Discharge: 2021-07-26 | Disposition: A | Discharge status: Home / Self Care | Payer: Medicare Other | Source: Ambulatory Visit | Attending: Internal Medicine | Admitting: Internal Medicine

## 2021-07-26 ENCOUNTER — Other Ambulatory Visit: Payer: Self-pay

## 2021-07-26 DIAGNOSIS — Z1231 Encounter for screening mammogram for malignant neoplasm of breast: Secondary | ICD-10-CM | POA: Diagnosis not present

## 2021-08-28 ENCOUNTER — Other Ambulatory Visit: Payer: Self-pay

## 2021-08-28 ENCOUNTER — Ambulatory Visit (INDEPENDENT_AMBULATORY_CARE_PROVIDER_SITE_OTHER): Payer: Medicare Other | Admitting: Internal Medicine

## 2021-08-28 VITALS — BP 132/58 | HR 66 | Ht 60.0 in | Wt 126.4 lb

## 2021-08-28 DIAGNOSIS — I5033 Acute on chronic diastolic (congestive) heart failure: Secondary | ICD-10-CM

## 2021-08-28 DIAGNOSIS — Z950 Presence of cardiac pacemaker: Secondary | ICD-10-CM

## 2021-08-28 DIAGNOSIS — M1711 Unilateral primary osteoarthritis, right knee: Secondary | ICD-10-CM

## 2021-08-28 DIAGNOSIS — I10 Essential (primary) hypertension: Secondary | ICD-10-CM | POA: Diagnosis not present

## 2021-08-28 DIAGNOSIS — I495 Sick sinus syndrome: Secondary | ICD-10-CM

## 2021-08-28 DIAGNOSIS — G894 Chronic pain syndrome: Secondary | ICD-10-CM

## 2021-08-28 NOTE — Assessment & Plan Note (Signed)
Pacemaker is working well 

## 2021-08-28 NOTE — Assessment & Plan Note (Signed)
Refer to the pain specialist 

## 2021-08-28 NOTE — Assessment & Plan Note (Signed)
This is a chronic problem.

## 2021-08-28 NOTE — Assessment & Plan Note (Signed)

## 2021-08-28 NOTE — Progress Notes (Signed)
Established Patient Office Visit  Subjective:  Patient ID: Sue Knapp, female    DOB: [birth year]  Age: 88 y.o. MRN: [id number]  CC:  Chief Complaint  Patient presents with   Pacemaker Check    HPI  Sue Knapp presents for pacer check, one mole on medial side of nose  was skin cancer  Past Medical History:  Diagnosis Date   Arthritis    Breast cancer ([place]) 2013   left breast, radiation and lumpectomy   Cataract    Fatigue    GERD (gastroesophageal reflux disease)    Glaucoma    Heart murmur    Heart palpitations    Hiatal hernia    HOH (hard of hearing)    AIDS   Hyperlipidemia    Hypertension    Hypothyroidism    Left ankle injury    Old left ankle injury   Left bundle branch block    Neuropathy    Osteoporosis    Pacemaker    Palpitations    Personal history of radiation therapy 2013   left breast ca   Scoliosis    Shortness of breath dyspnea    Thyroid nodule    TIA (transient ischemic attack)     Past Surgical History:  Procedure Laterality Date   ABDOMINAL HYSTERECTOMY     BREAST BIOPSY Left 2005   bengin   BREAST BIOPSY Left 2013   stereo bx, invasive tubular carcinoma and DCIS   BREAST LUMPECTOMY Left 02/21/2012   invasive tubular carcinoma and DCIS, clear margins, 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   BREAST SURGERY     LUMPECTOMY   CATARACT EXTRACTION W/PHACO Right 03/01/2015   Procedure: CATARACT EXTRACTION PHACO AND INTRAOCULAR LENS PLACEMENT ([place]);  Surgeon: Birder Robson, MD;  Location: [facility];  Service: Ophthalmology;  Laterality: Right;  US:1:03.1 AP:22.8 CDE:14.37  Fluid lot # [id number]   CATARACT EXTRACTION W/PHACO Left 03/22/2015   Procedure: CATARACT EXTRACTION PHACO AND INTRAOCULAR LENS PLACEMENT ([facility]);  Surgeon: Birder Robson, MD;  Location: [facility];  Service: Ophthalmology;  Laterality: Left;  [place]: 01:01.0 AP%: 22.5% CDE: 13.75 Lot # [id number]   ESOPHAGOGASTRODUODENOSCOPY (EGD) WITH PROPOFOL N/A 12/04/2020   Procedure:  ESOPHAGOGASTRODUODENOSCOPY (EGD) WITH PROPOFOL;  Surgeon: Lin Landsman, MD;  Location: [place];  Service: Gastroenterology;  Laterality: N/A;   ESOPHAGOGASTRODUODENOSCOPY (EGD) WITH PROPOFOL N/A 12/04/2020   Procedure: ESOPHAGOGASTRODUODENOSCOPY (EGD) WITH PROPOFOL;  Surgeon: Lin Landsman, MD;  Location: [facility] ENDOSCOPY;  Service: Gastroenterology;  Laterality: N/A;   EYE SURGERY  09/2020 and 10/2020   MASTECTOMY PARTIAL / LUMPECTOMY     THYROIDECTOMY, PARTIAL      Family History  Problem Relation Age of Onset   Breast cancer Neg Hx     Social History   Socioeconomic History   Marital status: Widowed    Spouse name: Not on file   Number of children: 3   Years of education: Not on file   Highest education level: High school graduate  Occupational History   Not on file  Tobacco Use   Smoking status: Never   Smokeless tobacco: Never  Substance and Sexual Activity   Alcohol use: No   Drug use: Never    Comment: pain clinic: tramadol   Sexual activity: Not Currently  Other Topics Concern   Not on file  Social History Narrative   Not on file   Social Determinants of Health   Financial Resource Strain: Low Risk    Difficulty of Paying Living Expenses: Not hard  at all  Food Insecurity: No Food Insecurity   Worried About Charity fundraiser in the Last Year: Never true   Ran Out of Food in the Last Year: Never true  Transportation Needs: No Transportation Needs   Lack of Transportation (Medical): No   Lack of Transportation (Non-Medical): No  Physical Activity: Inactive   Days of Exercise per Week: 0 days   Minutes of Exercise per Session: 0 min  Stress: Stress Concern Present   Feeling of Stress : To some extent  Social Connections: Moderately Integrated   Frequency of Communication with Friends and Family: More than three times a week   Frequency of Social Gatherings with Friends and Family: More than three times a week   Attends Religious Services:  More than 4 times per year   Active Member of [employer] or Organizations: Yes   Attends Archivist Meetings: More than 4 times per year   Marital Status: Widowed  Human resources officer Violence: Not At Risk   Fear of Current or Ex-Partner: No   Emotionally Abused: No   Physically Abused: No   Sexually Abused: No     Current Outpatient Medications:    acetaminophen (TYLENOL) 650 MG CR tablet, Take 2 tablets (1,300 mg total) by mouth 2 (two) times daily as needed for pain., Disp: , Rfl:    albuterol (VENTOLIN HFA) 108 (90 Base) MCG/ACT inhaler, Inhale 2 puffs into the lungs every 6 (six) hours as needed for wheezing or shortness of breath., Disp: 8 g, Rfl: 4   ALPRAZolam (XANAX) 0.25 MG tablet, Take 1 tablet by mouth daily as needed., Disp: , Rfl:    Dentifrices (BIOTENE DRY MOUTH DT), Place onto teeth., Disp: , Rfl:    diclofenac Sodium (VOLTAREN) 1 % GEL, Apply topically., Disp: , Rfl:    docusate sodium (COLACE) 100 MG capsule, Take 1 capsule (100 mg total) by mouth 2 (two) times daily., Disp: 10 capsule, Rfl: 0   docusate sodium (COLACE) 100 MG capsule, Take 1 capsule (100 mg total) by mouth 2 (two) times daily., Disp: 60 capsule, Rfl: 0   ELIQUIS 2.5 MG TABS tablet, TAKE 1 TABLET BY MOUTH TWICE DAILY, Disp: 60 tablet, Rfl: 6   furosemide (LASIX) 20 MG tablet, Take 1 tablet (20 mg total) by mouth daily., Disp: 30 tablet, Rfl: 3   metoprolol succinate (TOPROL-XL) 50 MG 24 hr tablet, TAKE 1 TABLET BY MOUTH DAILY, Disp: 30 tablet, Rfl: 6   NYAMYC powder, Apply topically., Disp: , Rfl:    omeprazole (PRILOSEC) 20 MG capsule, Take 20 mg by mouth daily., Disp: , Rfl:    pregabalin (LYRICA) 50 MG capsule, TAKE 1 CAPSULE 3 TIMES DAILY (Patient taking differently: Take 50 mg by mouth 3 (three) times daily.), Disp: 90 capsule, Rfl: 3   psyllium (METAMUCIL) 58.6 % packet, Take 1 packet by mouth daily., Disp: 30 each, Rfl: 2   torsemide (DEMADEX) 20 MG tablet, Take 20 mg by mouth daily., Disp: ,  Rfl:    traMADol (ULTRAM) 50 MG tablet, Take 1 tablet (50 mg total) by mouth 2 (two) times daily., Disp: 60 tablet, Rfl: 2   traMADol (ULTRAM-ER) 200 MG 24 hr tablet, Take 1 tablet (200 mg total) by mouth daily., Disp: 30 tablet, Rfl: 2   trolamine salicylate (ASPERCREME/ALOE) 10 % cream, Apply 1 application topically as needed for muscle pain., Disp: , Rfl:    omeprazole (PRILOSEC OTC) 20 MG tablet, Take 1 tablet (20 mg total) by mouth daily.,  Disp: 30 tablet, Rfl: 0   Allergies  Allergen Reactions   Codeine Other (See Comments)     Alt Ment Status (per notation in 2013)    ROS Review of Systems  Constitutional: Negative.   HENT: Negative.    Eyes: Negative.   Respiratory: Negative.    Cardiovascular: Negative.   Gastrointestinal: Negative.   Endocrine: Negative.   Genitourinary: Negative.   Musculoskeletal:  Positive for arthralgias, back pain, gait problem, myalgias, neck pain and neck stiffness.  Skin: Negative.   Allergic/Immunologic: Negative.   Hematological: Negative.   Psychiatric/Behavioral: Negative.    All other systems reviewed and are negative.    Objective:    Physical Exam Vitals reviewed.  Constitutional:      Appearance: Normal appearance.  HENT:     Mouth/Throat:     Mouth: Mucous membranes are moist.  Eyes:     Pupils: Pupils are equal, round, and reactive to light.  Neck:     Vascular: No carotid bruit.  Cardiovascular:     Rate and Rhythm: Normal rate and regular rhythm.     Pulses: Normal pulses.     Heart sounds: Normal heart sounds.  Pulmonary:     Effort: Pulmonary effort is normal.     Breath sounds: Normal breath sounds.  Abdominal:     General: Bowel sounds are normal.     Palpations: Abdomen is soft. There is no hepatomegaly, splenomegaly or mass.     Tenderness: There is no abdominal tenderness.     Hernia: No hernia is present.  Musculoskeletal:        General: No tenderness.     Cervical back: Neck supple.     Right lower leg:  No edema.     Left lower leg: No edema.  Skin:    Findings: No rash.  Neurological:     Mental Status: She is alert and oriented to person, place, and time.     Motor: No weakness.  Psychiatric:        Mood and Affect: Mood and affect normal.        Behavior: Behavior normal.    BP (!) 132/58    Pulse 66    Ht 5' (1.524 m)    Wt 126 lb 6.4 oz (57.3 kg)    BMI 24.69 kg/m  Wt Readings from Last 3 Encounters:  08/28/21 126 lb 6.4 oz (57.3 kg)  06/27/21 126 lb 6.4 oz (57.3 kg)  04/06/21 121 lb (54.9 kg)     Health Maintenance Due  Topic Date Due   Zoster Vaccines- Shingrix (1 of 2) Never done   Pneumonia Vaccine 45+ Years old (3 - PCV) 06/23/2021    There are no preventive care reminders to display for this patient.  Lab Results  Component Value Date   TSH 4.12 07/11/2021   Lab Results  Component Value Date   WBC 6.6 07/11/2021   HGB 11.5 (L) 07/11/2021   HCT 35.6 07/11/2021   MCV 91.5 07/11/2021   PLT 344 07/11/2021   Lab Results  Component Value Date   NA 141 07/11/2021   K 4.1 07/11/2021   CO2 31 07/11/2021   GLUCOSE 100 (H) 07/11/2021   BUN 32 (H) 07/11/2021   CREATININE 1.13 (H) 07/11/2021   BILITOT 1.0 07/11/2021   ALKPHOS 77 03/12/2021   AST 26 07/11/2021   ALT 20 07/11/2021   PROT 6.2 07/11/2021   ALBUMIN 3.2 (L) 03/12/2021   CALCIUM 9.4 07/11/2021   ANIONGAP  4 (L) 03/12/2021   EGFR 45 (L) 07/11/2021   Lab Results  Component Value Date   CHOL 183 07/11/2021   Lab Results  Component Value Date   HDL 46 (L) 07/11/2021   Lab Results  Component Value Date   LDLCALC 118 (H) 07/11/2021   Lab Results  Component Value Date   TRIG 86 07/11/2021   Lab Results  Component Value Date   CHOLHDL 4.0 07/11/2021   No results found for: HGBA1C    Assessment & Plan:   Problem List Items Addressed This Visit       Cardiovascular and Mediastinum   Sick sinus syndrome due to SA node dysfunction [facility])    Patient medically optimized.  Battery life  is 26 months            Essential hypertension     Patient denies any chest pain or shortness of breath there is no history of palpitation or paroxysmal nocturnal dyspnea   patient was advised to follow low-salt low-cholesterol diet    ideally I want to keep systolic blood pressure below 130 mmHg, patient was asked to check blood pressure one times a week and give me a report on that.  Patient will be follow-up in 3 months  or earlier as needed, patient will call me back for any change in the cardiovascular symptoms Patient was advised to buy a book from local bookstore concerning blood pressure and read several chapters  every day.  This will be supplemented by some of the material we will give him from the office.  Patient should also utilize other resources like YouTube and Internet to learn more about the blood pressure and the diet.      CHF (congestive heart failure) (HCC)    Stable at the present time        Musculoskeletal and Integument   Primary osteoarthritis of right knee    This is a chronic problem        Other   Chronic pain syndrome    Refer to the pain specialist      Pacemaker    Pacemaker is working well      Other Visit Diagnoses     Cardiac pacemaker in situ    -  Primary   Relevant Orders   [place]       No orders of the defined types were placed in this encounter. Note: Medical Device Follow-up  Patient pacemaker was interrogated by pacemakers analyzer, battery status is okay.  No programming changes were indicated after the review of the data.  Histogram shows no change since the last interrogation Atrial and ventricular sensing thresholds were found to be acceptable Impedance was checked and it was found to be normal.  Thresholds were found to be okay on evaluation of rhythm problem.  No high rate or low rate arrhythmia were noted.  Estimated battery longevity is 26 months. I have personally reviewed the device data and  amended the report as necessary.    Follow-up: No follow-ups on file.    Cletis Athens, MD

## 2021-08-28 NOTE — Assessment & Plan Note (Signed)
Patient medically optimized.  Battery life is 26 months

## 2021-08-28 NOTE — Assessment & Plan Note (Signed)
Stable at the present time. 

## 2021-09-13 ENCOUNTER — Other Ambulatory Visit: Payer: Self-pay

## 2021-09-13 ENCOUNTER — Ambulatory Visit (INDEPENDENT_AMBULATORY_CARE_PROVIDER_SITE_OTHER): Payer: Medicare Other | Admitting: Internal Medicine

## 2021-09-13 ENCOUNTER — Encounter: Payer: Self-pay | Admitting: Internal Medicine

## 2021-09-13 VITALS — BP 130/60 | HR 62 | Ht 60.0 in | Wt 116.0 lb

## 2021-09-13 DIAGNOSIS — I495 Sick sinus syndrome: Secondary | ICD-10-CM | POA: Diagnosis not present

## 2021-09-13 DIAGNOSIS — M7989 Other specified soft tissue disorders: Secondary | ICD-10-CM

## 2021-09-13 DIAGNOSIS — I10 Essential (primary) hypertension: Secondary | ICD-10-CM | POA: Diagnosis not present

## 2021-09-13 LAB — ELECTROLYTE PANEL
CO2: 33 mmol/L — ABNORMAL HIGH (ref 20–32)
Chloride: 100 mmol/L (ref 98–110)
Potassium: 4.5 mmol/L (ref 3.5–5.3)
Sodium: 141 mmol/L (ref 135–146)

## 2021-09-13 NOTE — Assessment & Plan Note (Signed)
We will check electrolyte Demadex was increased to 40 mg p.o. daily Patient was advised to wrap both legs

## 2021-09-13 NOTE — Assessment & Plan Note (Signed)

## 2021-09-13 NOTE — Assessment & Plan Note (Signed)
Pacemaker is working well 

## 2021-09-13 NOTE — Progress Notes (Signed)
Established Patient Office Visit  Subjective:  Patient ID: Sue Knapp, female    DOB: October 15, 1922  Age: 88 y.o. MRN: [id number]  CC:  Chief Complaint  Patient presents with   Edema    Patient having swelling of right leg, fluid pill was increased.     HPI  Sue Knapp presents for swelling  of both legs, started  on demedex Past Medical History:  Diagnosis Date   Arthritis    Breast cancer ([place]) 2013   left breast, radiation and lumpectomy   Cataract    Fatigue    GERD (gastroesophageal reflux disease)    Glaucoma    Heart murmur    Heart palpitations    Hiatal hernia    HOH (hard of hearing)    AIDS   Hyperlipidemia    Hypertension    Hypothyroidism    Left ankle injury    Old left ankle injury   Left bundle branch block    Neuropathy    Osteoporosis    Pacemaker    Palpitations    Personal history of radiation therapy 2013   left breast ca   Scoliosis    Shortness of breath dyspnea    Thyroid nodule    TIA (transient ischemic attack)     Past Surgical History:  Procedure Laterality Date   ABDOMINAL HYSTERECTOMY     BREAST BIOPSY Left 2005   bengin   BREAST BIOPSY Left 2013   stereo bx, invasive tubular carcinoma and DCIS   BREAST LUMPECTOMY Left 02/21/2012   invasive tubular carcinoma and DCIS, clear margins, negative LN   BREAST SURGERY     LUMPECTOMY   CATARACT EXTRACTION W/PHACO Right 03/01/2015   Procedure: CATARACT EXTRACTION PHACO AND INTRAOCULAR LENS PLACEMENT ([place]);  Surgeon: Birder Robson, MD;  Location: [facility];  Service: Ophthalmology;  Laterality: Right;  US:1:03.1 AP:22.8 CDE:14.37  Fluid lot # [id number]   CATARACT EXTRACTION W/PHACO Left 03/22/2015   Procedure: CATARACT EXTRACTION PHACO AND INTRAOCULAR LENS PLACEMENT ([facility]);  Surgeon: Birder Robson, MD;  Location: [facility];  Service: Ophthalmology;  Laterality: Left;  [place]: 01:01.0 AP%: 22.5% CDE: 13.75 Lot # [id number]   ESOPHAGOGASTRODUODENOSCOPY (EGD) WITH PROPOFOL N/A  12/04/2020   Procedure: ESOPHAGOGASTRODUODENOSCOPY (EGD) WITH PROPOFOL;  Surgeon: Lin Landsman, MD;  Location: [place];  Service: Gastroenterology;  Laterality: N/A;   ESOPHAGOGASTRODUODENOSCOPY (EGD) WITH PROPOFOL N/A 12/04/2020   Procedure: ESOPHAGOGASTRODUODENOSCOPY (EGD) WITH PROPOFOL;  Surgeon: Lin Landsman, MD;  Location[facility] ENDOSCOPY;  Service: Gastroenterology;  Laterality: N/A;   EYE SURGERY  09/2020 and 10/2020   MASTECTOMY PARTIAL / LUMPECTOMY     THYROIDECTOMY, PARTIAL      Family History  Problem Relation Age of Onset   Breast cancer Neg Hx     Social History   Socioeconomic History   Marital status: Widowed    Spouse name: Not on file   Number of children: 3   Years of education: Not on file   Highest education level: High school graduate  Occupational History   Not on file  Tobacco Use   Smoking status: Never   Smokeless tobacco: Never  Substance and Sexual Activity   Alcohol use: No   Drug use: Never    Comment: pain clinic: tramadol   Sexual activity: Not Currently  Other Topics Concern   Not on file  Social History Narrative   Not on file   Social Determinants of Health   Financial Resource Strain: Low Risk  Difficulty of Paying Living Expenses: Not hard at all  Food Insecurity: No Food Insecurity   Worried About [place] in the Last Year: Never true   Ran Out of Food in the Last Year: Never true  Transportation Needs: No Transportation Needs   Lack of Transportation (Medical): No   Lack of Transportation (Non-Medical): No  Physical Activity: Inactive   Days of Exercise per Week: 0 days   Minutes of Exercise per Session: 0 min  Stress: Stress Concern Present   Feeling of Stress : To some extent  Social Connections: Moderately Integrated   Frequency of Communication with Friends and Family: More than three times a week   Frequency of Social Gatherings with Friends and Family: More than three times a week   Attends  Religious Services: More than 4 times per year   Active Member of [employer] or Organizations: Yes   Attends Archivist Meetings: More than 4 times per year   Marital Status: Widowed  Human resources officer Violence: Not At Risk   Fear of Current or Ex-Partner: No   Emotionally Abused: No   Physically Abused: No   Sexually Abused: No     Current Outpatient Medications:    acetaminophen (TYLENOL) 650 MG CR tablet, Take 2 tablets (1,300 mg total) by mouth 2 (two) times daily as needed for pain., Disp: , Rfl:    albuterol (VENTOLIN HFA) 108 (90 Base) MCG/ACT inhaler, Inhale 2 puffs into the lungs every 6 (six) hours as needed for wheezing or shortness of breath., Disp: 8 g, Rfl: 4   ALPRAZolam (XANAX) 0.25 MG tablet, Take 0.25 mg by mouth daily as needed., Disp: , Rfl:    Dentifrices (BIOTENE DRY MOUTH DT), Place onto teeth., Disp: , Rfl:    diclofenac Sodium (VOLTAREN) 1 % GEL, Apply topically., Disp: , Rfl:    docusate sodium (COLACE) 100 MG capsule, Take 1 capsule (100 mg total) by mouth 2 (two) times daily., Disp: 10 capsule, Rfl: 0   ELIQUIS 2.5 MG TABS tablet, TAKE 1 TABLET BY MOUTH TWICE DAILY, Disp: 60 tablet, Rfl: 6   metoprolol succinate (TOPROL-XL) 50 MG 24 hr tablet, TAKE 1 TABLET BY MOUTH DAILY, Disp: 30 tablet, Rfl: 6   NYAMYC powder, Apply topically., Disp: , Rfl:    omeprazole (PRILOSEC OTC) 20 MG tablet, Take 1 tablet (20 mg total) by mouth daily., Disp: 30 tablet, Rfl: 0   psyllium (METAMUCIL) 58.6 % packet, Take 1 packet by mouth daily., Disp: 30 each, Rfl: 2   torsemide (DEMADEX) 20 MG tablet, Take 40 mg by mouth daily., Disp: , Rfl:    traMADol (ULTRAM) 50 MG tablet, Take 1 tablet (50 mg total) by mouth 2 (two) times daily., Disp: 60 tablet, Rfl: 2   trolamine salicylate (ASPERCREME/ALOE) 10 % cream, Apply 1 application topically as needed for muscle pain., Disp: , Rfl:    Allergies  Allergen Reactions   Codeine Other (See Comments)     Alt Ment Status (per notation in  2013)    ROS Review of Systems  Respiratory:  Negative for cough.   Cardiovascular:  Positive for leg swelling. Negative for chest pain.  Genitourinary:  Negative for difficulty urinating.  Musculoskeletal:  Positive for arthralgias and back pain.  Psychiatric/Behavioral:  The patient is nervous/anxious.      Objective:    Physical Exam Constitutional:      Appearance: Normal appearance.  Cardiovascular:     Rate and Rhythm: Regular rhythm.  Pulmonary:  Effort: No respiratory distress.     Breath sounds: No wheezing or rales.  Psychiatric:        Mood and Affect: Mood normal.    BP 130/60    Pulse 62    Ht 5' (1.524 m)    Wt 116 lb (52.6 kg)    BMI 22.65 kg/m  Wt Readings from Last 3 Encounters:  09/13/21 116 lb (52.6 kg)  08/28/21 126 lb 6.4 oz (57.3 kg)  06/27/21 126 lb 6.4 oz (57.3 kg)     Health Maintenance Due  Topic Date Due   Zoster Vaccines- Shingrix (1 of 2) Never done   Pneumonia Vaccine 25+ Years old (2 - PCV) 06/23/2021    There are no preventive care reminders to display for this patient.  Lab Results  Component Value Date   TSH 4.12 07/11/2021   Lab Results  Component Value Date   WBC 6.6 07/11/2021   HGB 11.5 (L) 07/11/2021   HCT 35.6 07/11/2021   MCV 91.5 07/11/2021   PLT 344 07/11/2021   Lab Results  Component Value Date   NA 141 07/11/2021   K 4.1 07/11/2021   CO2 31 07/11/2021   GLUCOSE 100 (H) 07/11/2021   BUN 32 (H) 07/11/2021   CREATININE 1.13 (H) 07/11/2021   BILITOT 1.0 07/11/2021   ALKPHOS 77 03/12/2021   AST 26 07/11/2021   ALT 20 07/11/2021   PROT 6.2 07/11/2021   ALBUMIN 3.2 (L) 03/12/2021   CALCIUM 9.4 07/11/2021   ANIONGAP 4 (L) 03/12/2021   EGFR 45 (L) 07/11/2021   Lab Results  Component Value Date   CHOL 183 07/11/2021   Lab Results  Component Value Date   HDL 46 (L) 07/11/2021   Lab Results  Component Value Date   LDLCALC 118 (H) 07/11/2021   Lab Results  Component Value Date   TRIG 86  07/11/2021   Lab Results  Component Value Date   CHOLHDL 4.0 07/11/2021   No results found for: HGBA1C    Assessment & Plan:   Problem List Items Addressed This Visit       Cardiovascular and Mediastinum   Sick sinus syndrome due to SA node dysfunction ([place]) - Primary    Pacemaker is working well.      Essential hypertension     Patient denies any chest pain or shortness of breath there is no history of palpitation or paroxysmal nocturnal dyspnea   patient was advised to follow low-salt low-cholesterol diet    ideally I want to keep systolic blood pressure below 130 mmHg, patient was asked to check blood pressure one times a week and give me a report on that.  Patient will be follow-up in 3 months  or earlier as needed, patient will call me back for any change in the cardiovascular symptoms Patient was advised to buy a book from local bookstore concerning blood pressure and read several chapters  every day.  This will be supplemented by some of the material we will give him from the office.  Patient should also utilize other resources like YouTube and Internet to learn more about the blood pressure and the diet.        Other   Leg swelling    We will check electrolyte Demadex was increased to 40 mg p.o. daily Patient was advised to wrap both legs       Relevant Orders   Electrolyte panel    No orders of the defined types were placed in  this encounter.   Follow-up: No follow-ups on file.    Cletis Athens, MD

## 2021-09-19 ENCOUNTER — Telehealth: Payer: Self-pay | Admitting: Student in an Organized Health Care Education/Training Program

## 2021-09-19 ENCOUNTER — Other Ambulatory Visit: Payer: Self-pay

## 2021-09-19 ENCOUNTER — Telehealth: Payer: Self-pay | Admitting: *Deleted

## 2021-09-19 ENCOUNTER — Encounter: Payer: Self-pay | Admitting: Student in an Organized Health Care Education/Training Program

## 2021-09-19 ENCOUNTER — Ambulatory Visit
Payer: Medicare Other | Attending: Student in an Organized Health Care Education/Training Program | Admitting: Student in an Organized Health Care Education/Training Program

## 2021-09-19 VITALS — BP 112/76 | HR 64 | Temp 96.9°F | Resp 16 | Ht 59.0 in | Wt 112.0 lb

## 2021-09-19 DIAGNOSIS — M1712 Unilateral primary osteoarthritis, left knee: Secondary | ICD-10-CM | POA: Insufficient documentation

## 2021-09-19 DIAGNOSIS — M19011 Primary osteoarthritis, right shoulder: Secondary | ICD-10-CM

## 2021-09-19 DIAGNOSIS — M1711 Unilateral primary osteoarthritis, right knee: Secondary | ICD-10-CM | POA: Insufficient documentation

## 2021-09-19 DIAGNOSIS — S46001S Unspecified injury of muscle(s) and tendon(s) of the rotator cuff of right shoulder, sequela: Secondary | ICD-10-CM | POA: Diagnosis present

## 2021-09-19 DIAGNOSIS — M19111 Post-traumatic osteoarthritis, right shoulder: Secondary | ICD-10-CM | POA: Insufficient documentation

## 2021-09-19 DIAGNOSIS — G894 Chronic pain syndrome: Secondary | ICD-10-CM

## 2021-09-19 DIAGNOSIS — M170 Bilateral primary osteoarthritis of knee: Secondary | ICD-10-CM | POA: Diagnosis present

## 2021-09-19 MED ORDER — TRAMADOL HCL 50 MG PO TABS: 50.0000 mg | ORAL_TABLET | Freq: Two times a day (BID) | ORAL | 2 refills | Status: DC

## 2021-09-19 MED ORDER — TRAMADOL HCL ER 200 MG PO TB24: 200.0000 mg | ORAL_TABLET | Freq: Every day | ORAL | 2 refills | Status: DC

## 2021-09-19 NOTE — Telephone Encounter (Signed)
Called patient, left message instructing patient/family to return to the office to pick up prescriptions.

## 2021-09-19 NOTE — Addendum Note (Signed)
Addended by: Gillis Santa on: 09/19/2021 02:33 PM   Modules accepted: Orders

## 2021-09-19 NOTE — Telephone Encounter (Signed)
Patient's daughter called back after visit today and the Rx's for today were printed instead of being sent to [facility] in [place].  I have entered these in que and asked BL to please resend to pharmacy.

## 2021-09-19 NOTE — Progress Notes (Signed)
PROVIDER NOTE: Information contained herein reflects review and annotations entered in association with encounter. Interpretation of such information and data should be left to medically-trained personnel. Information provided to patient can be located elsewhere in the medical record under "Patient Instructions". Document created using STT-dictation technology, any transcriptional errors that may result from process are unintentional.    Patient: Sue Knapp  Service Category: E/M  Provider: Gillis Santa, MD  DOB: 1922/11/28  DOS: 09/19/2021  Specialty: Interventional Pain Management  MRN: [id number]  Setting: Ambulatory outpatient  PCP: Cletis Athens, MD  Type: Established Patient    Referring Provider: Cletis Athens, MD  Location: Office  Delivery: Face-to-face     HPI  [name], a 88 y.o. year old female, is here today because of her Primary osteoarthritis of right knee [M17.11]. [name] primary complain today is Leg Pain (bilateral) and Shoulder Pain (bilateral)  Pain Assessment: Severity of Chronic pain is reported as a 0-No pain/10. Location: Leg Right, Left/ . Onset: More than a month ago. Quality: Aching. Timing: Intermittent. Modifying factor(s): rest. Vitals:  height is $RemoveB'4\' 11"'WgRitTuU$  (1.499 m) and weight is 112 lb (50.8 kg). Her temperature is 96.9 F (36.1 C) (abnormal). Her blood pressure is 112/76 and her pulse is 64. Her respiration is 16 and oxygen saturation is 98%.   Reason for encounter: medication management.   [name] presents today for medication management.  Since her last clinic visit with me she did see Dr. Candelaria Stagers with orthopedic surgery at [facility].  There she had a right ultrasound-guided intra-articular knee joint aspiration and injection as well as right ultrasound-guided glenohumeral joint injection.  She would like to change her tramadol ER intake in the evening which she thinks will be better at managing her pain. She lives in a nursing  facility. She is accompanied by her daughter.   06/27/21 Patient resides in a nursing facility.  She states that over the last 2 months she has struggled with increased shoulder pain and bilateral knee pain.  We discussed increasing her tramadol ER to 200 mg daily.  We also discussed making her tramadol 50 mg immediate release scheduled for 12 PM and 7 PM for preemptive analgesia.    ROS  Constitutional: Denies any fever or chills Gastrointestinal: No reported hemesis, hematochezia, vomiting, or acute GI distress Musculoskeletal:  Diffuse arthralgias and myalgias: Most pronounced in her knees and shoulders Neurological: No reported episodes of acute onset apraxia, aphasia, dysarthria, agnosia, amnesia, paralysis, loss of coordination, or loss of consciousness  Medication Review  ALPRAZolam, Dentifrices, acetaminophen, albuterol, apixaban, diclofenac Sodium, docusate sodium, metoprolol succinate, nystatin, omeprazole, psyllium, torsemide, traMADol, and trolamine salicylate  History Review  Allergy: [name] is allergic to codeine. Drug: [name]  reports no history of drug use. Alcohol:  reports no history of alcohol use. Tobacco:  reports that she has never smoked. She has never used smokeless tobacco. Social: [name]  reports that she has never smoked. She has never used smokeless tobacco. She reports that she does not drink alcohol and does not use drugs. Medical:  has a past medical history of Arthritis, Breast cancer ([place]) (2013), Cataract, Fatigue, GERD (gastroesophageal reflux disease), Glaucoma, Heart murmur, Heart palpitations, Hiatal hernia, HOH (hard of hearing), Hyperlipidemia, Hypertension, Hypothyroidism, Left ankle injury, Left bundle branch block, Neuropathy, Osteoporosis, Pacemaker, Palpitations, Personal history of radiation therapy (2013), Scoliosis, Shortness of breath dyspnea, Thyroid nodule, and TIA (transient ischemic attack). Surgical: Sue Knapp  has a past  surgical  history that includes Thyroidectomy, partial; Abdominal hysterectomy; Breast surgery; Cataract extraction w/PHACO (Right, 03/01/2015); Cataract extraction w/PHACO (Left, 03/22/2015); Mastectomy partial / lumpectomy; Breast lumpectomy (Left, 02/21/2012); Breast biopsy (Left, 2005); Breast biopsy (Left, 2013); Eye surgery (09/2020 and 10/2020); Esophagogastroduodenoscopy (egd) with propofol (N/A, 12/04/2020); and Esophagogastroduodenoscopy (egd) with propofol (N/A, 12/04/2020). Family: family history is not on file.  Laboratory Chemistry Profile   Renal Lab Results  Component Value Date   BUN 32 (H) 07/11/2021   CREATININE 1.13 (H) 07/11/2021   BCR 28 (H) 07/11/2021   GFRAA 37 (L) 11/28/2020   GFRNONAA 48 (L) 03/12/2021     Hepatic Lab Results  Component Value Date   AST 26 07/11/2021   ALT 20 07/11/2021   ALBUMIN 3.2 (L) 03/12/2021   ALKPHOS 77 03/12/2021   LIPASE 36 09/14/2019     Electrolytes Lab Results  Component Value Date   NA 141 09/13/2021   K 4.5 09/13/2021   CL 100 09/13/2021   CALCIUM 9.4 07/11/2021   MG 1.9 12/04/2020     Bone No results found for: VD25OH, VD125OH2TOT, ZO1096EA5, WU9811BJ4, 25OHVITD1, 25OHVITD2, 25OHVITD3, TESTOFREE, TESTOSTERONE   Inflammation (CRP: Acute Phase) (ESR: Chronic Phase) No results found for: CRP, ESRSEDRATE, LATICACIDVEN     Note: Above Lab results reviewed.  Recent Imaging Review  MM 3D SCREEN BREAST BILATERAL CLINICAL DATA:  Screening.  EXAM: DIGITAL SCREENING BILATERAL MAMMOGRAM WITH TOMOSYNTHESIS AND CAD  TECHNIQUE: Bilateral screening digital craniocaudal and mediolateral oblique mammograms were obtained. Bilateral screening digital breast tomosynthesis was performed. The images were evaluated with computer-aided detection.  COMPARISON:  Previous exam(s).  ACR Breast Density Category b: There are scattered areas of fibroglandular density.  FINDINGS: There are no findings suspicious for  malignancy.  IMPRESSION: No mammographic evidence of malignancy. A result letter of this screening mammogram will be mailed directly to the patient.  RECOMMENDATION: Screening mammogram in one year. (Code:SM-B-01Y)  BI-RADS CATEGORY  1: Negative.  Electronically Signed   By: Marin Olp M.D.   On: 07/26/2021 12:40  Note: Reviewed        Physical Exam  General appearance: Well nourished, well developed, and well hydrated. In no apparent acute distress Mental status: Alert, oriented x 3 (person, place, & time)       Respiratory: No evidence of acute respiratory distress Eyes: PERLA Vitals: BP 112/76    Pulse 64    Temp (!) 96.9 F (36.1 C)    Resp 16    Ht $R'4\' 11"'AX$  (1.499 m)    Wt 112 lb (50.8 kg)    SpO2 98%    BMI 22.62 kg/m  BMI: Estimated body mass index is 22.62 kg/m as calculated from the following:   Height as of this encounter: $RemoveBeforeD'4\' 11"'vwNfyUnvCTsDEe$  (1.499 m).   Weight as of this encounter: 112 lb (50.8 kg). Ideal: Patient must be at least 60 in tall to calculate ideal body weight  Lumbar Spine Area Exam  Skin & Axial Inspection: No masses, redness, or swelling Alignment: Symmetrical Functional ROM: Pain restricted ROM       Stability: No instability detected Muscle Tone/Strength: Functionally intact. No obvious neuro-muscular anomalies detected. Sensory (Neurological): Musculoskeletal pain pattern   Gait & Posture Assessment  Ambulation: Patient came in today in a wheel chair Gait: Limited. Using assistive device to ambulate Posture: Difficulty standing up straight, due to pain    Lower Extremity Exam      Side: Right lower extremity   Side: Left lower extremity  Stability: No instability  observed           Stability: No instability observed          Skin & Extremity Inspection: brace in place   Skin & Extremity Inspection: Atrophy  Functional ROM: Pain restricted ROM for knee joint           Functional ROM: Pain restricted ROM for knee joint          Muscle Tone/Strength:  Functionally intact. No obvious neuro-muscular anomalies detected.   Muscle Tone/Strength: Functionally intact. No obvious neuro-muscular anomalies detected.  Sensory (Neurological): Arthropathic arthralgia         Sensory (Neurological): Arthropathic arthralgia         Assessment   Status Diagnosis  Persistent Persistent Persistent 1. Primary osteoarthritis of right knee   2. Primary osteoarthritis of left knee   3. Osteoarthritis of right shoulder due to rotator cuff injury   4. Osteoarthritis of right shoulder, unspecified osteoarthritis type   5. Bilateral primary osteoarthritis of knee   6. Chronic pain syndrome        Plan of Care   Sue Knapp has a current medication list which includes the following long-term medication(s): albuterol, eliquis, metoprolol succinate, torsemide, and omeprazole.  Pharmacotherapy (Medications Ordered): Meds ordered this encounter  Medications   traMADol (ULTRAM) 50 MG tablet    Sig: Take 1 tablet (50 mg total) by mouth 2 (two) times daily.    Dispense:  60 tablet    Refill:  2    At 9am  and 3pm   traMADol (ULTRAM-ER) 200 MG 24 hr tablet    Sig: Take 1 tablet (200 mg total) by mouth daily. To take at 730 pm after dinner    Dispense:  30 tablet    Refill:  2     Follow-up plan:   Return in about 3 months (around 12/17/2021) for Medication Management, in person.     Status post right knee genicular nerve block 1 on 03/09/2019.  S/p right shoulder steroid injection 03/23/19. 05/04/2019:  R Genicular RFA- not effective.        Recent Visits Date Type Provider Dept  06/27/21 Office Visit Gillis Santa, MD [facility]  Showing recent visits within past 90 days and meeting all other requirements Today's Visits Date Type Provider Dept  09/19/21 Office Visit Gillis Santa, MD [facility]  Showing today's visits and meeting all other requirements Future Appointments Date Type Provider Dept  12/07/21 Appointment  Gillis Santa, MD [facility]  Showing future appointments within next 90 days and meeting all other requirements  I discussed the assessment and treatment plan with the patient. The patient was provided an opportunity to ask questions and all were answered. The patient agreed with the plan and demonstrated an understanding of the instructions.  Patient advised to call back or seek an in-person evaluation if the symptoms or condition worsens.  Duration of encounter: 107minutes.  Note by: Gillis Santa, MD Date: 09/19/2021; Time: 11:32 AM

## 2021-09-21 ENCOUNTER — Emergency Department: Payer: Medicare Other

## 2021-09-21 ENCOUNTER — Other Ambulatory Visit: Payer: Self-pay

## 2021-09-21 DIAGNOSIS — E039 Hypothyroidism, unspecified: Secondary | ICD-10-CM | POA: Diagnosis not present

## 2021-09-21 DIAGNOSIS — M25551 Pain in right hip: Secondary | ICD-10-CM | POA: Diagnosis not present

## 2021-09-21 DIAGNOSIS — S0990XA Unspecified injury of head, initial encounter: Secondary | ICD-10-CM | POA: Diagnosis present

## 2021-09-21 DIAGNOSIS — I10 Essential (primary) hypertension: Secondary | ICD-10-CM | POA: Insufficient documentation

## 2021-09-21 DIAGNOSIS — Z79899 Other long term (current) drug therapy: Secondary | ICD-10-CM | POA: Insufficient documentation

## 2021-09-21 DIAGNOSIS — W01198A Fall on same level from slipping, tripping and stumbling with subsequent striking against other object, initial encounter: Secondary | ICD-10-CM | POA: Insufficient documentation

## 2021-09-21 DIAGNOSIS — D649 Anemia, unspecified: Secondary | ICD-10-CM | POA: Insufficient documentation

## 2021-09-21 DIAGNOSIS — N390 Urinary tract infection, site not specified: Secondary | ICD-10-CM | POA: Diagnosis not present

## 2021-09-21 DIAGNOSIS — Z950 Presence of cardiac pacemaker: Secondary | ICD-10-CM | POA: Diagnosis not present

## 2021-09-21 DIAGNOSIS — Z853 Personal history of malignant neoplasm of breast: Secondary | ICD-10-CM | POA: Insufficient documentation

## 2021-09-21 DIAGNOSIS — Y92002 Bathroom of unspecified non-institutional (private) residence single-family (private) house as the place of occurrence of the external cause: Secondary | ICD-10-CM | POA: Diagnosis not present

## 2021-09-21 DIAGNOSIS — W19XXXA Unspecified fall, initial encounter: Secondary | ICD-10-CM

## 2021-09-21 DIAGNOSIS — Z7901 Long term (current) use of anticoagulants: Secondary | ICD-10-CM | POA: Diagnosis not present

## 2021-09-21 NOTE — ED Triage Notes (Signed)
[facility].  Pt states she was in bathroom and turned, and lost her balance.  Pt states she fell on right hip, then went backwards and hit her right side of her head.  Pt denies LOC.  States she was dizzy after hitting her head.  Pt has hematoma noted to right side of head. No bleeding noted.  Pt does take eliquis at home.  Pt A&O x4 at this time in NAD.  No shortening or rotation noted to right hip.   ?

## 2021-09-22 ENCOUNTER — Emergency Department: Payer: Medicare Other

## 2021-09-22 ENCOUNTER — Emergency Department
Admission: EM | Admit: 2021-09-22 | Discharge: 2021-09-22 | Disposition: A | Discharge status: Home / Self Care | Payer: Medicare Other | Attending: Emergency Medicine | Admitting: Emergency Medicine

## 2021-09-22 DIAGNOSIS — N390 Urinary tract infection, site not specified: Secondary | ICD-10-CM

## 2021-09-22 DIAGNOSIS — M25551 Pain in right hip: Secondary | ICD-10-CM

## 2021-09-22 DIAGNOSIS — S0990XA Unspecified injury of head, initial encounter: Secondary | ICD-10-CM

## 2021-09-22 DIAGNOSIS — W19XXXA Unspecified fall, initial encounter: Secondary | ICD-10-CM

## 2021-09-22 LAB — URINALYSIS, ROUTINE W REFLEX MICROSCOPIC
Bilirubin Urine: NEGATIVE
Glucose, UA: NEGATIVE mg/dL
Hgb urine dipstick: NEGATIVE
Ketones, ur: NEGATIVE mg/dL
Nitrite: NEGATIVE
Protein, ur: NEGATIVE mg/dL
Specific Gravity, Urine: 1.016 (ref 1.005–1.030)
pH: 5 (ref 5.0–8.0)

## 2021-09-22 LAB — CBC
HCT: 36.6 % (ref 36.0–46.0)
Hemoglobin: 11.6 g/dL — ABNORMAL LOW (ref 12.0–15.0)
MCH: 29.4 pg (ref 26.0–34.0)
MCHC: 31.7 g/dL (ref 30.0–36.0)
MCV: 92.9 fL (ref 80.0–100.0)
Platelets: 304 10*3/uL (ref 150–400)
RBC: 3.94 MIL/uL (ref 3.87–5.11)
RDW: 16.3 % — ABNORMAL HIGH (ref 11.5–15.5)
WBC: 7.6 10*3/uL (ref 4.0–10.5)
nRBC: 0 % (ref 0.0–0.2)

## 2021-09-22 LAB — BASIC METABOLIC PANEL
Anion gap: 9 (ref 5–15)
BUN: 60 mg/dL — ABNORMAL HIGH (ref 8–23)
CO2: 31 mmol/L (ref 22–32)
Calcium: 9.4 mg/dL (ref 8.9–10.3)
Chloride: 101 mmol/L (ref 98–111)
Creatinine, Ser: 1.5 mg/dL — ABNORMAL HIGH (ref 0.44–1.00)
GFR, Estimated: 32 mL/min — ABNORMAL LOW (ref >60)
Glucose, Bld: 108 mg/dL — ABNORMAL HIGH (ref 70–99)
Potassium: 4.4 mmol/L (ref 3.5–5.1)
Sodium: 141 mmol/L (ref 135–145)

## 2021-09-22 MED ORDER — ACETAMINOPHEN 500 MG PO TABS
1000.0000 mg | ORAL_TABLET | Freq: Once | ORAL | Status: AC
  Administered 2021-09-22: 1000 mg via ORAL
  Filled 2021-09-22: qty 2

## 2021-09-22 MED ORDER — CEPHALEXIN 500 MG PO CAPS
500.0000 mg | ORAL_CAPSULE | Freq: Once | ORAL | Status: AC
  Administered 2021-09-22: 500 mg via ORAL
  Filled 2021-09-22: qty 1

## 2021-09-22 MED ORDER — CEPHALEXIN 500 MG PO CAPS: 500.0000 mg | ORAL_CAPSULE | Freq: Two times a day (BID) | ORAL | 0 refills | Status: DC

## 2021-09-22 NOTE — ED Notes (Signed)
RECALLED EMS FOR TRANSPORT BACK TO [facility] ?

## 2021-09-22 NOTE — ED Notes (Signed)
Called [facility] cancelled transport, [facility] will come get her ?

## 2021-09-22 NOTE — ED Provider Notes (Signed)
[facility] Provider Note    Event Date/Time   First MD Initiated Contact with Patient 09/22/21 [phone]     (approximate)   History   Fall   HPI  Sue Knapp is a 88 y.o. female with history of hypertension, hyperlipidemia, hypothyroidism, TIA, sick sinus syndrome status post pacemaker, atrial fibrillation on Eliquis who had a fall at [facility].  States she uses a walker at baseline and she went to the bathroom.  States that she lost her balance when she turned around too quickly.  No preceding chest pain, shortness of breath, dizziness.  No recent fevers, cough, vomiting or diarrhea.  She states she landed on her right hip and then her head hit the floor.  There is no loss of consciousness.  No numbness, tingling or focal weakness.   History provided by patient and daughter.    Past Medical History:  Diagnosis Date   Arthritis    Breast cancer ([place]) 2013   left breast, radiation and lumpectomy   Cataract    Fatigue    GERD (gastroesophageal reflux disease)    Glaucoma    Heart murmur    Heart palpitations    Hiatal hernia    HOH (hard of hearing)    AIDS   Hyperlipidemia    Hypertension    Hypothyroidism    Left ankle injury    Old left ankle injury   Left bundle branch block    Neuropathy    Osteoporosis    Pacemaker    Palpitations    Personal history of radiation therapy 2013   left breast ca   Scoliosis    Shortness of breath dyspnea    Thyroid nodule    TIA (transient ischemic attack)     Past Surgical History:  Procedure Laterality Date   ABDOMINAL HYSTERECTOMY     BREAST BIOPSY Left 2005   bengin   BREAST BIOPSY Left 2013   stereo bx, invasive tubular carcinoma and DCIS   BREAST LUMPECTOMY Left 02/21/2012   invasive tubular carcinoma and DCIS, clear margins, negative LN   BREAST SURGERY     LUMPECTOMY   CATARACT EXTRACTION W/PHACO Right 03/01/2015   Procedure: CATARACT EXTRACTION PHACO AND INTRAOCULAR LENS  PLACEMENT ([place]);  Surgeon: Birder Robson, MD;  Location: [facility] ORS;  Service: Ophthalmology;  Laterality: Right;  US:1:03.1 AP:22.8 CDE:14.37  Fluid lot # [id number]   CATARACT EXTRACTION W/PHACO Left 03/22/2015   Procedure: CATARACT EXTRACTION PHACO AND INTRAOCULAR LENS PLACEMENT (IOC);  Surgeon: Birder Robson, MD;  Location: [facility] ORS;  Service: Ophthalmology;  Laterality: Left;  [place]: 01:01.0 AP%: 22.5% CDE: 13.75 Lot # [id number]   ESOPHAGOGASTRODUODENOSCOPY (EGD) WITH PROPOFOL N/A 12/04/2020   Procedure: ESOPHAGOGASTRODUODENOSCOPY (EGD) WITH PROPOFOL;  Surgeon: Lin Landsman, MD;  Location: [place];  Service: Gastroenterology;  Laterality: N/A;   ESOPHAGOGASTRODUODENOSCOPY (EGD) WITH PROPOFOL N/A 12/04/2020   Procedure: ESOPHAGOGASTRODUODENOSCOPY (EGD) WITH PROPOFOL;  Surgeon: Lin Landsman, MD;  Location: [facility] ENDOSCOPY;  Service: Gastroenterology;  Laterality: N/A;   EYE SURGERY  09/2020 and 10/2020   MASTECTOMY PARTIAL / LUMPECTOMY     THYROIDECTOMY, PARTIAL      MEDICATIONS:  Prior to Admission medications   Medication Sig Start Date End Date Taking? Authorizing Provider  acetaminophen (TYLENOL) 650 MG CR tablet Take 2 tablets (1,300 mg total) by mouth 2 (two) times daily as needed for pain. 12/04/20   Jennye Boroughs, MD  albuterol (VENTOLIN HFA) 108 (90 Base) MCG/ACT inhaler Inhale 2  puffs into the lungs every 6 (six) hours as needed for wheezing or shortness of breath. 09/09/20   Beckie Salts, FNP  ALPRAZolam Duanne Moron) 0.25 MG tablet Take 0.25 mg by mouth daily as needed. 01/25/21   [provider]  Dentifrices ([place] DT) Place onto teeth.    [provider]  diclofenac Sodium (VOLTAREN) 1 % GEL Apply topically. 06/23/21   [provider]  docusate sodium (COLACE) 100 MG capsule Take 1 capsule (100 mg total) by mouth 2 (two) times daily. 07/11/21   Cletis Athens, MD  ELIQUIS 2.5 MG TABS tablet TAKE 1 TABLET BY MOUTH TWICE DAILY  11/30/20   Cletis Athens, MD  metoprolol succinate (TOPROL-XL) 50 MG 24 hr tablet TAKE 1 TABLET BY MOUTH DAILY 07/11/20   Cletis Athens, MD  [facility] powder Apply topically. 05/01/21   [provider]  omeprazole (PRILOSEC OTC) 20 MG tablet Take 1 tablet (20 mg total) by mouth daily. 12/04/20 09/13/21  Jennye Boroughs, MD  psyllium (METAMUCIL) 58.6 % packet Take 1 packet by mouth daily. 12/12/20   Virgel Manifold, MD  torsemide (DEMADEX) 20 MG tablet Take 40 mg by mouth daily.    [provider]  traMADol (ULTRAM) 50 MG tablet Take 1 tablet (50 mg total) by mouth 2 (two) times daily. 09/19/21 12/18/21  Gillis Santa, MD  traMADol (ULTRAM-ER) 200 MG 24 hr tablet Take 1 tablet (200 mg total) by mouth daily. To take at 730 pm after dinner 09/19/21 12/18/21  Gillis Santa, MD  trolamine salicylate (ASPERCREME/ALOE) 10 % cream Apply 1 application topically as needed for muscle pain.    [provider]    Physical Exam   Triage Vital Signs: ED Triage Vitals  Enc Vitals Group     BP 09/21/21 2345 (!) 147/62     Pulse Rate 09/21/21 2345 60     Resp 09/21/21 2345 17     Temp 09/21/21 2345 98.1 F (36.7 C)     Temp Source 09/21/21 2345 Oral     SpO2 09/21/21 2345 99 %     Weight 09/21/21 2346 118 lb (53.5 kg)     Height 09/21/21 2346 4\' 11"  (1.499 m)     Head Circumference --      Peak Flow --      Pain Score 09/21/21 2345 3     Pain Loc --      Pain Edu? --      Excl. in [place]? --     Most recent vital signs: Vitals:   09/21/21 2345 09/22/21 0403  BP: (!) 147/62 (!) 161/70  Pulse: 60 63  Resp: 17   Temp: 98.1 F (36.7 C)   SpO2: 99% 98%     CONSTITUTIONAL: Alert and oriented and responds appropriately to questions. Well-appearing; well-nourished; GCS 15, elderly, pleasant, no distress HEAD: Normocephalic; hematoma to the right posterior scalp EYES: Conjunctivae clear, PERRL, EOMI ENT: normal nose; no rhinorrhea; moist mucous membranes; pharynx without lesions  noted; no dental injury; no septal hematoma, no epistaxis; no facial deformity or bony tenderness NECK: Supple, no midline spinal tenderness, step-off or deformity; trachea midline CARD: RRR; S1 and S2 appreciated; no murmurs, no clicks, no rubs, no gallops RESP: Normal chest excursion without splinting or tachypnea; breath sounds clear and equal bilaterally; no wheezes, no rhonchi, no rales; no hypoxia or respiratory distress CHEST:  chest wall stable, no crepitus or ecchymosis or deformity, nontender to palpation; no flail chest ABD/GI: Normal bowel sounds; non-distended;  soft, non-tender, no rebound, no guarding; no ecchymosis or other lesions noted PELVIS:  stable, nontender to palpation, no leg length discrepancy BACK:  The back appears normal; no midline spinal tenderness, step-off or deformity EXT: Normal ROM in all joints; non-tender to palpation; no edema; normal capillary refill; no cyanosis, no bony tenderness or bony deformity of patient's extremities, no joint effusion, compartments are soft, extremities are warm and well-perfused, no ecchymosis SKIN: Normal color for age and race; warm NEURO: No facial asymmetry, normal speech, moving all extremities equally, able to stand at bedside without assistance  ED Results / Procedures / Treatments   LABS: (all labs ordered are listed, but only abnormal results are displayed) Labs Reviewed  CBC - Abnormal; Notable for the following components:      Result Value   Hemoglobin 11.6 (*)    RDW 16.3 (*)    All other components within normal limits  BASIC METABOLIC PANEL - Abnormal; Notable for the following components:   Glucose, Bld 108 (*)    BUN 60 (*)    Creatinine, Ser 1.50 (*)    GFR, Estimated 32 (*)    All other components within normal limits  URINALYSIS, ROUTINE W REFLEX MICROSCOPIC - Abnormal; Notable for the following components:   Color, Urine YELLOW (*)    APPearance HAZY (*)    Leukocytes,Ua MODERATE (*)    Bacteria, UA  MANY (*)    All other components within normal limits  URINE CULTURE     EKG:  EKG Interpretation  Date/Time:  Thursday September 21 2021 23:55:55 EST Ventricular Rate:  60 PR Interval:    QRS Duration: 166 QT Interval:  484 QTC Calculation: 484 R Axis:   -80 Text Interpretation: Ventricular-paced rhythm Abnormal ECG When compared with ECG of 11-Mar-2021 23:40, PREVIOUS ECG IS PRESENT Confirmed by Pryor Curia [phone]) on 09/22/2021 3:49:04 AM          RADIOLOGY: My personal review and interpretation of imaging: CT head and x-ray of the right hip showed no traumatic injury.  I have personally reviewed all radiology reports. CT HEAD WO CONTRAST (5MM)  Result Date: 09/22/2021 CLINICAL DATA:  Trauma. EXAM: CT HEAD WITHOUT CONTRAST TECHNIQUE: Contiguous axial images were obtained from the base of the skull through the vertex without intravenous contrast. RADIATION DOSE REDUCTION: This exam was performed according to the departmental dose-optimization program which includes automated exposure control, adjustment of the mA and/or kV according to patient size and/or use of iterative reconstruction technique. COMPARISON:  Head CT dated 03/12/2021. FINDINGS: Brain: Mild age-related atrophy and chronic microvascular ischemic changes. There is no acute intracranial hemorrhage. No mass effect or midline shift. No extra-axial fluid collection. Vascular: No hyperdense vessel or unexpected calcification. Skull: Normal. Negative for fracture or focal lesion. Sinuses/Orbits: No acute finding. Other: Right parietal scalp hematoma. IMPRESSION: 1. No acute intracranial pathology. 2. Mild age-related atrophy and chronic microvascular ischemic changes. Electronically Signed   By: Anner Crete M.D.   On: 09/22/2021 00:14   DG HIP UNILAT WITH PELVIS 2-3 VIEWS RIGHT  Result Date: 09/22/2021 CLINICAL DATA:  Fall.  Right hip pain. EXAM: DG HIP (WITH OR WITHOUT PELVIS) 2-3V RIGHT COMPARISON:  10/27/2005 FINDINGS:  Hip joints and SI joints are symmetric. No acute bony abnormality. Specifically, no fracture, subluxation, or dislocation. Degenerative changes in the visualized lower lumbar spine. IMPRESSION: No acute bony abnormality. Electronically Signed   By: Rolm Baptise M.D.   On: 09/22/2021 00:37     PROCEDURES:  Critical  Care performed: No   CRITICAL CARE Performed by: Pryor Curia   Total critical care time: 0 minutes  Critical care time was exclusive of separately billable procedures and treating other patients.  Critical care was necessary to treat or prevent imminent or life-threatening deterioration.  Critical care was time spent personally by me on the following activities: development of treatment plan with patient and/or surrogate as well as nursing, discussions with consultants, evaluation of patient's response to treatment, examination of patient, obtaining history from patient or surrogate, ordering and performing treatments and interventions, ordering and review of laboratory studies, ordering and review of radiographic studies, pulse oximetry and re-evaluation of patient's condition.   Procedures    IMPRESSION / MDM / ASSESSMENT AND PLAN / ED COURSE  I reviewed the triage vital signs and the nursing notes.  Patient here after mechanical fall complaining of right hip pain and head injury on Eliquis.     DIFFERENTIAL DIAGNOSIS (includes but not limited to):   Mechanical fall, hematoma, skull fracture, intracranial hemorrhage, hip fracture, hip contusion, hip dislocation   PLAN: We will obtain CBC, BMP, urinalysis, x-rays of the right hip, CT of the head.  Will give Tylenol for pain control.   MEDICATIONS GIVEN IN ED: Medications  cephALEXin (KEFLEX) capsule 500 mg (has no administration in time range)  acetaminophen (TYLENOL) tablet 1,000 mg (1,000 mg Oral Given 09/22/21 0407)     ED COURSE: Patient's labs reassuring.  She has mild anemia which is stable.  Normal  electrolytes.  She has chronic kidney disease which is stable.  Urine does show moderate leukocytes and 6-10 white blood cells and many bacteria with no squamous cells.  We will send urine culture and start her on Keflex.  CT of her head as well as x-ray of the right hip reviewed by myself and radiologist and showed no acute traumatic injury including fracture, dislocation or intracranial hemorrhage.  EKG shows a paced rhythm.  She states that she did not hear any alarms or feel anything abnormal from her pacemaker.  She has been able to stand here without assistance and denies having any pain.  I feel she is safe to be discharged home and her family is also comfortable with this plan.  Her fall seems like it was mechanical in nature.   At this time, I do not feel there is any life-threatening condition present. I reviewed all nursing notes, vitals, pertinent previous records.  All lab and urine results, EKGs, imaging ordered have been independently reviewed and interpreted by myself.  I reviewed all available radiology reports from any imaging ordered this visit.  Based on my assessment, I feel the patient is safe to be discharged home without further emergent workup and can continue workup as an outpatient as needed. Discussed all findings, treatment plan as well as usual and customary return precautions with patient and daughter.  They verbalize understanding and are comfortable with this plan.  Outpatient follow-up has been provided as needed.  All questions have been answered.    CONSULTS: No admission needed at this time given reassuring work-up, patient able to ambulate, hemodynamically stable.   OUTSIDE RECORDS REVIEWED: Reviewed patient's last office visit with Dr. Lavera Guise on 08/28/2021.         FINAL CLINICAL IMPRESSION(S) / ED DIAGNOSES   Final diagnoses:  Fall  Injury of head, initial encounter  Right hip pain  Acute UTI     Rx / DC Orders   ED Discharge Orders  Ordered    cephALEXin (KEFLEX) 500 MG capsule  2 times daily        09/22/21 0516             Note:  This document was prepared using Dragon voice recognition software and may include unintentional dictation errors.   Tailer Volkert, Delice Bison, DO 09/22/21 [phone]

## 2021-09-22 NOTE — ED Notes (Signed)
EMS called for transport back to [place] ?

## 2021-09-24 LAB — URINE CULTURE: Culture: >=100000 — AB

## 2021-11-27 ENCOUNTER — Ambulatory Visit (INDEPENDENT_AMBULATORY_CARE_PROVIDER_SITE_OTHER): Payer: Medicare Other | Admitting: Internal Medicine

## 2021-11-27 VITALS — BP 123/68 | HR 72 | Ht 59.0 in | Wt 116.0 lb

## 2021-11-27 DIAGNOSIS — I495 Sick sinus syndrome: Secondary | ICD-10-CM | POA: Diagnosis not present

## 2021-11-27 DIAGNOSIS — G894 Chronic pain syndrome: Secondary | ICD-10-CM | POA: Diagnosis not present

## 2021-11-27 DIAGNOSIS — I10 Essential (primary) hypertension: Secondary | ICD-10-CM | POA: Diagnosis not present

## 2021-11-27 DIAGNOSIS — Z950 Presence of cardiac pacemaker: Secondary | ICD-10-CM | POA: Diagnosis not present

## 2021-11-27 NOTE — Assessment & Plan Note (Signed)
Patient is under care of pain specialist ?

## 2021-11-27 NOTE — Assessment & Plan Note (Signed)
No arrhythmia or syncope was noted ?

## 2021-11-27 NOTE — Progress Notes (Signed)
? ?Established Patient Office Visit ? ?Subjective:  ?Patient ID: Sue Knapp, female    DOB: 1922/09/06  Age: 88 y.o. MRN: [id number] ? ?CC:  ?Chief Complaint  ?Patient presents with  ? Pacemaker Check  ? ? ?HPI ? ?Sue Knapp presents for pacemaker check.  Patient is known to have severe osteoarthritis of multiple joint and in the pain clinic, she has been seen by the orthopedic surgeons multiple times, she resides in the nursing home at the present time.  She does not smoke does not drink.  She was found to be comfortable today.  Her pacemaker is working well.  There is no history of palpitation or syncope. ? ?Past Medical History:  ?Diagnosis Date  ? Arthritis   ? Breast cancer ([place]) 2013  ? left breast, radiation and lumpectomy  ? Cataract   ? Fatigue   ? GERD (gastroesophageal reflux disease)   ? Glaucoma   ? Heart murmur   ? Heart palpitations   ? Hiatal hernia   ? HOH (hard of hearing)   ? AIDS  ? Hyperlipidemia   ? Hypertension   ? Hypothyroidism   ? Left ankle injury   ? Old left ankle injury  ? Left bundle branch block   ? Neuropathy   ? Osteoporosis   ? Pacemaker   ? Palpitations   ? Personal history of radiation therapy 2013  ? left breast ca  ? Scoliosis   ? Shortness of breath dyspnea   ? Thyroid nodule   ? TIA (transient ischemic attack)   ? ? ?Past Surgical History:  ?Procedure Laterality Date  ? ABDOMINAL HYSTERECTOMY    ? BREAST BIOPSY Left 2005  ? bengin  ? BREAST BIOPSY Left 2013  ? stereo bx, invasive tubular carcinoma and DCIS  ? BREAST LUMPECTOMY Left 02/21/2012  ? invasive tubular carcinoma and DCIS, clear margins, negative LN  ? BREAST SURGERY    ? LUMPECTOMY  ? CATARACT EXTRACTION W/PHACO Right 03/01/2015  ? Procedure: CATARACT EXTRACTION PHACO AND INTRAOCULAR LENS PLACEMENT (IOC);  Surgeon: Birder Robson, MD;  Location: [facility] ORS;  Service: Ophthalmology;  Laterality: Right;  US:1:03.1 ?AP:22.8 ?CDE:14.37 ? ?Fluid lot # [id number]  ? CATARACT EXTRACTION W/PHACO Left 03/22/2015  ?  Procedure: CATARACT EXTRACTION PHACO AND INTRAOCULAR LENS PLACEMENT ([facility]);  Surgeon: Birder Robson, MD;  Location: [facility] ORS;  Service: Ophthalmology;  Laterality: Left;  [place]: 01:01.0 ?AP%: 22.5% ?CDE: 13.75 ?Lot # [id number]  ? ESOPHAGOGASTRODUODENOSCOPY (EGD) WITH PROPOFOL N/A 12/04/2020  ? Procedure: ESOPHAGOGASTRODUODENOSCOPY (EGD) WITH PROPOFOL;  Surgeon: Lin Landsman, MD;  Location: [facility] ENDOSCOPY;  Service: Gastroenterology;  Laterality: N/A;  ? ESOPHAGOGASTRODUODENOSCOPY (EGD) WITH PROPOFOL N/A 12/04/2020  ? Procedure: ESOPHAGOGASTRODUODENOSCOPY (EGD) WITH PROPOFOL;  Surgeon: Lin Landsman, MD;  Location: [facility] ENDOSCOPY;  Service: Gastroenterology;  Laterality: N/A;  ? EYE SURGERY  09/2020 and 10/2020  ? MASTECTOMY PARTIAL / LUMPECTOMY    ? THYROIDECTOMY, PARTIAL    ? ? ?Family History  ?Problem Relation Age of Onset  ? Breast cancer Neg Hx   ? ? ?Social History  ? ?Socioeconomic History  ? Marital status: Widowed  ?  Spouse name: Not on file  ? Number of children: 3  ? Years of education: Not on file  ? Highest education level: High school graduate  ?Occupational History  ? Not on file  ?Tobacco Use  ? Smoking status: Never  ? Smokeless tobacco: Never  ?Substance and Sexual Activity  ? Alcohol use: No  ? Drug  use: Never  ?  Comment: pain clinic: tramadol  ? Sexual activity: Not Currently  ?Other Topics Concern  ? Not on file  ?Social History Narrative  ? Not on file  ? ?Social Determinants of Health  ? ?Financial Resource Strain: Low Risk   ? Difficulty of Paying Living Expenses: Not hard at all  ?Food Insecurity: No Food Insecurity  ? Worried About Charity fundraiser in the Last Year: Never true  ? Ran Out of Food in the Last Year: Never true  ?Transportation Needs: No Transportation Needs  ? Lack of Transportation (Medical): No  ? Lack of Transportation (Non-Medical): No  ?Physical Activity: Inactive  ? Days of Exercise per Week: 0 days  ? Minutes of Exercise per Session: 0 min  ?Stress: Stress  Concern Present  ? Feeling of Stress : To some extent  ?Social Connections: Moderately Integrated  ? Frequency of Communication with Friends and Family: More than three times a week  ? Frequency of Social Gatherings with Friends and Family: More than three times a week  ? Attends Religious Services: More than 4 times per year  ? Active Member of Clubs or Organizations: Yes  ? Attends Archivist Meetings: More than 4 times per year  ? Marital Status: Widowed  ?Intimate Partner Violence: Not At Risk  ? Fear of Current or Ex-Partner: No  ? Emotionally Abused: No  ? Physically Abused: No  ? Sexually Abused: No  ? ? ? ?Current Outpatient Medications:  ?  acetaminophen (TYLENOL) 650 MG CR tablet, Take 2 tablets (1,300 mg total) by mouth 2 (two) times daily as needed for pain., Disp: , Rfl:  ?  albuterol (VENTOLIN HFA) 108 (90 Base) MCG/ACT inhaler, Inhale 2 puffs into the lungs every 6 (six) hours as needed for wheezing or shortness of breath., Disp: 8 g, Rfl: 4 ?  ALPRAZolam (XANAX) 0.25 MG tablet, Take 0.25 mg by mouth daily as needed., Disp: , Rfl:  ?  cephALEXin (KEFLEX) 500 MG capsule, Take 1 capsule (500 mg total) by mouth 2 (two) times daily., Disp: 14 capsule, Rfl: 0 ?  Dentifrices (BIOTENE DRY MOUTH DT), Place onto teeth., Disp: , Rfl:  ?  diclofenac Sodium (VOLTAREN) 1 % GEL, Apply topically., Disp: , Rfl:  ?  docusate sodium (COLACE) 100 MG capsule, Take 1 capsule (100 mg total) by mouth 2 (two) times daily., Disp: 10 capsule, Rfl: 0 ?  ELIQUIS 2.5 MG TABS tablet, TAKE 1 TABLET BY MOUTH TWICE DAILY, Disp: 60 tablet, Rfl: 6 ?  metoprolol succinate (TOPROL-XL) 50 MG 24 hr tablet, TAKE 1 TABLET BY MOUTH DAILY, Disp: 30 tablet, Rfl: 6 ?  NYAMYC powder, Apply topically., Disp: , Rfl:  ?  omeprazole (PRILOSEC OTC) 20 MG tablet, Take 1 tablet (20 mg total) by mouth daily., Disp: 30 tablet, Rfl: 0 ?  psyllium (METAMUCIL) 58.6 % packet, Take 1 packet by mouth daily., Disp: 30 each, Rfl: 2 ?  torsemide  (DEMADEX) 20 MG tablet, Take 40 mg by mouth daily., Disp: , Rfl:  ?  traMADol (ULTRAM) 50 MG tablet, Take 1 tablet (50 mg total) by mouth 2 (two) times daily., Disp: 60 tablet, Rfl: 2 ?  traMADol (ULTRAM-ER) 200 MG 24 hr tablet, Take 1 tablet (200 mg total) by mouth daily. To take at 730 pm after dinner, Disp: 30 tablet, Rfl: 2 ?  trolamine salicylate (ASPERCREME/ALOE) 10 % cream, Apply 1 application topically as needed for muscle pain., Disp: , Rfl:   ? ?  Allergies  ?Allergen Reactions  ? Codeine Other (See Comments)  ?   Alt Ment Status (per notation in 2013)  ? ? ?ROS ?Review of Systems  ?Respiratory: Negative.    ?Gastrointestinal:  Negative for abdominal distention.  ?Genitourinary: Negative.   ?Musculoskeletal:  Positive for arthralgias, back pain, gait problem and myalgias.  ?Neurological:  Negative for dizziness.  ? ?  ?Objective:  ?  ?Physical Exam ?Vitals reviewed.  ?Constitutional:   ?   Appearance: Normal appearance.  ?HENT:  ?   Mouth/Throat:  ?   Mouth: Mucous membranes are moist.  ?Eyes:  ?   Pupils: Pupils are equal, round, and reactive to light.  ?Neck:  ?   Vascular: No carotid bruit.  ?Cardiovascular:  ?   Rate and Rhythm: Normal rate and regular rhythm.  ?   Pulses: Normal pulses.  ?   Heart sounds: Normal heart sounds.  ?Pulmonary:  ?   Effort: Pulmonary effort is normal.  ?   Breath sounds: Normal breath sounds.  ?Abdominal:  ?   General: Bowel sounds are normal.  ?   Palpations: Abdomen is soft. There is no hepatomegaly, splenomegaly or mass.  ?   Tenderness: There is no abdominal tenderness.  ?   Hernia: No hernia is present.  ?Musculoskeletal:     ?   General: No tenderness.  ?   Cervical back: Neck supple.  ?   Right lower leg: No edema.  ?   Left lower leg: No edema.  ?Skin: ?   Findings: No rash.  ?Neurological:  ?   Mental Status: She is alert and oriented to person, place, and time.  ?   Motor: No weakness.  ?Psychiatric:     ?   Mood and Affect: Mood and affect normal.     ?   Behavior:  Behavior normal.  ? ? ?BP 123/68   Pulse 72   Ht '4\' 11"'$  (1.499 m)   Wt 116 lb (52.6 kg)   BMI 23.43 kg/m?  ?Wt Readings from Last 3 Encounters:  ?11/27/21 116 lb (52.6 kg)  ?09/21/21 118 lb (53.5 kg)  ?09/19/21

## 2021-11-27 NOTE — Assessment & Plan Note (Signed)
Blood pressure is stable at the present ?

## 2021-11-27 NOTE — Assessment & Plan Note (Signed)
Assessment & Plan: ? ?Note: Medical Device Follow-up ? ?Patient pacemaker was interrogated by pacemakers analyzer, battery status is okay.  No programming changes were indicated after the review of the data.  Histogram shows no change since the last interrogation ?Atrial and ventricular sensing thresholds were found to be acceptable ?Impedance was checked and it was found to be normal.  Thresholds were found to be okay on evaluation of rhythm problem.  No high rate or low rate arrhythmia were noted.  Estimated battery longevity is 23 months. I have personally reviewed the device data and amended the report as necessary.  ?

## 2021-12-04 ENCOUNTER — Telehealth: Payer: Self-pay | Admitting: Student in an Organized Health Care Education/Training Program

## 2021-12-04 NOTE — Telephone Encounter (Signed)
Pt's Daughter would like to get MM every 4 months instead of every 3 months for her Mother? Please call Pt with an update. ?

## 2021-12-05 NOTE — Telephone Encounter (Signed)
Pt's daughter is asking if they can get meds sent in (Tramadol) and schedule her appointments further apart. 4 to 6 months. She is scheduled 12-07-21 for med mgmt, script runs out on 12-18-21. Please advise ?

## 2021-12-05 NOTE — Telephone Encounter (Signed)
She can discuss this matter at her May appointment. That is for her and the physician to discuss. Patient is 89 years and this may be why he chooses to touch base every 3 months. I am sure he  will be willing to discuss at next visit.  ?

## 2021-12-07 ENCOUNTER — Encounter: Payer: Self-pay | Admitting: Student in an Organized Health Care Education/Training Program

## 2021-12-07 ENCOUNTER — Ambulatory Visit
Payer: Medicare Other | Attending: Student in an Organized Health Care Education/Training Program | Admitting: Student in an Organized Health Care Education/Training Program

## 2021-12-07 VITALS — BP 134/58 | HR 61 | Temp 97.6°F | Resp 16 | Ht 60.0 in | Wt 116.0 lb

## 2021-12-07 DIAGNOSIS — M19011 Primary osteoarthritis, right shoulder: Secondary | ICD-10-CM | POA: Diagnosis not present

## 2021-12-07 DIAGNOSIS — M170 Bilateral primary osteoarthritis of knee: Secondary | ICD-10-CM | POA: Diagnosis present

## 2021-12-07 DIAGNOSIS — M19111 Post-traumatic osteoarthritis, right shoulder: Secondary | ICD-10-CM | POA: Diagnosis not present

## 2021-12-07 DIAGNOSIS — G894 Chronic pain syndrome: Secondary | ICD-10-CM | POA: Insufficient documentation

## 2021-12-07 DIAGNOSIS — S46001S Unspecified injury of muscle(s) and tendon(s) of the rotator cuff of right shoulder, sequela: Secondary | ICD-10-CM | POA: Diagnosis present

## 2021-12-07 DIAGNOSIS — M1711 Unilateral primary osteoarthritis, right knee: Secondary | ICD-10-CM | POA: Insufficient documentation

## 2021-12-07 DIAGNOSIS — M1712 Unilateral primary osteoarthritis, left knee: Secondary | ICD-10-CM | POA: Insufficient documentation

## 2021-12-07 MED ORDER — TRAMADOL HCL ER 200 MG PO TB24: 200.0000 mg | ORAL_TABLET | Freq: Every day | ORAL | 3 refills | Status: DC

## 2021-12-07 MED ORDER — TRAMADOL HCL 50 MG PO TABS: 50.0000 mg | ORAL_TABLET | Freq: Two times a day (BID) | ORAL | 3 refills | Status: DC

## 2021-12-07 NOTE — Progress Notes (Signed)
PROVIDER NOTE: Information contained herein reflects review and annotations entered in association with encounter. Interpretation of such information and data should be left to medically-trained personnel. Information provided to patient can be located elsewhere in the medical record under "Patient Instructions". Document created using STT-dictation technology, any transcriptional errors that may result from process are unintentional.    Patient: Sue Knapp  Service Category: E/M  Provider: Gillis Santa, MD  DOB: 86-10-1922  DOS: 12/07/2021  Specialty: Interventional Pain Management  MRN: [id number]  Setting: Ambulatory outpatient  PCP: Sue Ruths, MD  Type: Established Patient    Referring Provider: Cletis Athens, MD  Location: Office  Delivery: Face-to-face     HPI  [name], a 88 y.o. year old female, is here today because of her Primary osteoarthritis of right knee [M17.11]. [name] primary complain today is Shoulder Pain (Bilateral/) and Knee Pain (bilateral)  Pain Assessment: Severity of Chronic pain is reported as a 8 /10. Location: Shoulder Left/radoates dpwm top elbom. Onset: More than a month ago. Quality: Aching, Discomfort. Timing: Constant. Modifying factor(s): "Its hard to tell at 88",rest. Vitals:  height is 5' (1.524 m) and weight is 116 lb (52.6 kg). Her temperature is 97.6 F (36.4 C). Her blood pressure is 134/58 (abnormal) and her pulse is 61. Her respiration is 16 and oxygen saturation is 97%.   Reason for encounter: medication management.   Since the patient's last clinic visit with me, she did have a fall at the [facility] for which she presented to the emergency department.  She lost her balance when she turned around too quickly.  She landed on her right hip and hit the floor.  CT scan was negative for any acute changes.  She did have a right parietal slight occipital hematoma that has improved.  Otherwise, patient continues  multimodal pain regimen as prescribed with tramadol and acetaminophen..  States that it provides pain relief and improvement in functional status.  She continues to do lower extremity strengthening and stretching exercises.    ROS  Constitutional: Denies any fever or chills Gastrointestinal: No reported hemesis, hematochezia, vomiting, or acute GI distress Musculoskeletal:  Diffuse arthralgias and myalgias: Most pronounced in her knees and shoulders Neurological: No reported episodes of acute onset apraxia, aphasia, dysarthria, agnosia, amnesia, paralysis, loss of coordination, or loss of consciousness  Medication Review  ALPRAZolam, Dentifrices, acetaminophen, albuterol, apixaban, cephALEXin, diclofenac Sodium, docusate sodium, metoprolol succinate, nystatin, omeprazole, psyllium, torsemide, traMADol, and trolamine salicylate  History Review  Allergy: [name] is allergic to codeine. Drug: [name]  reports no history of drug use. Alcohol:  reports no history of alcohol use. Tobacco:  reports that she has never smoked. She has never used smokeless tobacco. Social: [name]  reports that she has never smoked. She has never used smokeless tobacco. She reports that she does not drink alcohol and does not use drugs. Medical:  has a past medical history of Arthritis, Breast cancer ([place]) (2013), Cataract, Fatigue, GERD (gastroesophageal reflux disease), Glaucoma, Heart murmur, Heart palpitations, Hiatal hernia, HOH (hard of hearing), Hyperlipidemia, Hypertension, Hypothyroidism, Left ankle injury, Left bundle branch block, Neuropathy, Osteoporosis, Pacemaker, Palpitations, Personal history of radiation therapy (2013), Scoliosis, Shortness of breath dyspnea, Thyroid nodule, and TIA (transient ischemic attack). Surgical: [name]  has a past surgical history that includes Thyroidectomy, partial; Abdominal hysterectomy; Breast surgery; Cataract extraction w/PHACO (Right, 03/01/2015); Cataract  extraction w/PHACO (Left, 03/22/2015); Mastectomy partial / lumpectomy; Breast lumpectomy (Left, 02/21/2012); Breast  biopsy (Left, 2005); Breast biopsy (Left, 2013); Eye surgery (09/2020 and 10/2020); Esophagogastroduodenoscopy (egd) with propofol (N/A, 12/04/2020); and Esophagogastroduodenoscopy (egd) with propofol (N/A, 12/04/2020). Family: family history is not on file.  Laboratory Chemistry Profile   Renal Lab Results  Component Value Date   BUN 60 (H) 09/21/2021   CREATININE 1.50 (H) 09/21/2021   BCR 28 (H) 07/11/2021   GFRAA 37 (L) 11/28/2020   GFRNONAA 32 (L) 09/21/2021     Hepatic Lab Results  Component Value Date   AST 26 07/11/2021   ALT 20 07/11/2021   ALBUMIN 3.2 (L) 03/12/2021   ALKPHOS 77 03/12/2021   LIPASE 36 09/14/2019     Electrolytes Lab Results  Component Value Date   NA 141 09/21/2021   K 4.4 09/21/2021   CL 101 09/21/2021   CALCIUM 9.4 09/21/2021   MG 1.9 12/04/2020     Bone No results found for: VD25OH, VD125OH2TOT, KG4010UV2, ZD6644IH4, 25OHVITD1, 25OHVITD2, 25OHVITD3, TESTOFREE, TESTOSTERONE   Inflammation (CRP: Acute Phase) (ESR: Chronic Phase) No results found for: CRP, ESRSEDRATE, LATICACIDVEN     Note: Above Lab results reviewed.  Recent Imaging Review  DG HIP UNILAT WITH PELVIS 2-3 VIEWS RIGHT CLINICAL DATA:  Fall.  Right hip pain.  EXAM: DG HIP (WITH OR WITHOUT PELVIS) 2-3V RIGHT  COMPARISON:  10/27/2005  FINDINGS: Hip joints and SI joints are symmetric. No acute bony abnormality. Specifically, no fracture, subluxation, or dislocation. Degenerative changes in the visualized lower lumbar spine.  IMPRESSION: No acute bony abnormality.  Electronically Signed   By: Rolm Baptise M.D.   On: 09/22/2021 00:37 CT HEAD WO CONTRAST (5MM) CLINICAL DATA:  Trauma.  EXAM: CT HEAD WITHOUT CONTRAST  TECHNIQUE: Contiguous axial images were obtained from the base of the skull through the vertex without intravenous contrast.  RADIATION DOSE  REDUCTION: This exam was performed according to the departmental dose-optimization program which includes automated exposure control, adjustment of the mA and/or kV according to patient size and/or use of iterative reconstruction technique.  COMPARISON:  Head CT dated 03/12/2021.  FINDINGS: Brain: Mild age-related atrophy and chronic microvascular ischemic changes. There is no acute intracranial hemorrhage. No mass effect or midline shift. No extra-axial fluid collection.  Vascular: No hyperdense vessel or unexpected calcification.  Skull: Normal. Negative for fracture or focal lesion.  Sinuses/Orbits: No acute finding.  Other: Right parietal scalp hematoma.  IMPRESSION: 1. No acute intracranial pathology. 2. Mild age-related atrophy and chronic microvascular ischemic changes.  Electronically Signed   By: Anner Crete M.D.   On: 09/22/2021 00:14  Note: Reviewed        Physical Exam  General appearance: Well nourished, well developed, and well hydrated. In no apparent acute distress Mental status: Alert, oriented x 3 (person, place, & time)       Respiratory: No evidence of acute respiratory distress Eyes: PERLA Vitals: BP (!) 134/58   Pulse 61   Temp 97.6 F (36.4 C)   Resp 16   Ht 5' (1.524 m)   Wt 116 lb (52.6 kg)   SpO2 97%   BMI 22.65 kg/m  BMI: Estimated body mass index is 22.65 kg/m as calculated from the following:   Height as of this encounter: 5' (1.524 m).   Weight as of this encounter: 116 lb (52.6 kg). Ideal: Ideal body weight: 45.5 kg (100 lb 4.9 oz) Adjusted ideal body weight: 48.3 kg (106 lb 9.4 oz)  Lumbar Spine Area Exam  Skin & Axial Inspection: No masses, redness, or swelling  Alignment: Symmetrical Functional ROM: Pain restricted ROM       Stability: No instability detected Muscle Tone/Strength: Functionally intact. No obvious neuro-muscular anomalies detected. Sensory (Neurological): Musculoskeletal pain pattern   Gait & Posture  Assessment  Ambulation: Patient came in today in a wheel chair Gait: Limited. Using assistive device to ambulate Posture: Difficulty standing up straight, due to pain    Lower Extremity Exam      Side: Right lower extremity   Side: Left lower extremity  Stability: No instability observed           Stability: No instability observed          Skin & Extremity Inspection: brace in place   Skin & Extremity Inspection: Atrophy  Functional ROM: Pain restricted ROM for knee joint           Functional ROM: Pain restricted ROM for knee joint          Muscle Tone/Strength: Functionally intact. No obvious neuro-muscular anomalies detected.   Muscle Tone/Strength: Functionally intact. No obvious neuro-muscular anomalies detected.  Sensory (Neurological): Arthropathic arthralgia         Sensory (Neurological): Arthropathic arthralgia         5 out of 5 strength bilateral lower extremity: Plantar flexion, dorsiflexion, knee flexion, knee extension.   Assessment   Status Diagnosis  Persistent Persistent Persistent 1. Primary osteoarthritis of right knee   2. Primary osteoarthritis of left knee   3. Osteoarthritis of right shoulder due to rotator cuff injury   4. Osteoarthritis of right shoulder, unspecified osteoarthritis type   5. Bilateral primary osteoarthritis of knee   6. Chronic pain syndrome        Plan of Care   [name] has a current medication list which includes the following long-term medication(s): albuterol, eliquis, metoprolol succinate, torsemide, and omeprazole.  Pharmacotherapy (Medications Ordered): Meds ordered this encounter  Medications   traMADol (ULTRAM-ER) 200 MG 24 hr tablet    Sig: Take 1 tablet (200 mg total) by mouth daily. To take at 730 pm after dinner    Dispense:  30 tablet    Refill:  3   traMADol (ULTRAM) 50 MG tablet    Sig: Take 1 tablet (50 mg total) by mouth 2 (two) times daily.    Dispense:  60 tablet    Refill:  3    At 9am  and  3pm     Follow-up plan:   Return in about 4 months (around 04/09/2022) for Medication Management, in person.     Status post right knee genicular nerve block 1 on 03/09/2019.  S/p right shoulder steroid injection 03/23/19. 05/04/2019:  R Genicular RFA- not effective.        Recent Visits Date Type Provider Dept  09/19/21 Office Visit Sue Santa, MD [facility]  Showing recent visits within past 90 days and meeting all other requirements Today's Visits Date Type Provider Dept  12/07/21 Office Visit Sue Santa, MD [facility]  Showing today's visits and meeting all other requirements Future Appointments No visits were found meeting these conditions. Showing future appointments within next 90 days and meeting all other requirements  I discussed the assessment and treatment plan with the patient. The patient was provided an opportunity to ask questions and all were answered. The patient agreed with the plan and demonstrated an understanding of the instructions.  Patient advised to call back or seek an in-person evaluation if the symptoms or condition worsens.  Duration  of encounter: 73minutes.  Note by: Sue Santa, MD Date: 12/07/2021; Time: 11:35 AM

## 2021-12-07 NOTE — Progress Notes (Signed)
Safety precautions to be maintained throughout the outpatient stay will include: orient to surroundings, keep bed in low position, maintain call bell within reach at all times, provide assistance with transfer out of bed and ambulation.  Medications at the [facility]

## 2022-02-26 ENCOUNTER — Ambulatory Visit (INDEPENDENT_AMBULATORY_CARE_PROVIDER_SITE_OTHER): Payer: Medicare Other | Admitting: Internal Medicine

## 2022-02-26 VITALS — BP 132/72 | HR 74

## 2022-02-26 DIAGNOSIS — Z950 Presence of cardiac pacemaker: Secondary | ICD-10-CM | POA: Diagnosis not present

## 2022-02-26 DIAGNOSIS — M12812 Other specific arthropathies, not elsewhere classified, left shoulder: Secondary | ICD-10-CM

## 2022-02-26 DIAGNOSIS — I5033 Acute on chronic diastolic (congestive) heart failure: Secondary | ICD-10-CM

## 2022-02-26 DIAGNOSIS — M19011 Primary osteoarthritis, right shoulder: Secondary | ICD-10-CM

## 2022-02-26 DIAGNOSIS — M1712 Unilateral primary osteoarthritis, left knee: Secondary | ICD-10-CM

## 2022-02-26 DIAGNOSIS — M1711 Unilateral primary osteoarthritis, right knee: Secondary | ICD-10-CM

## 2022-03-11 NOTE — Assessment & Plan Note (Signed)
Chronic problem due to osteoarthritis

## 2022-03-11 NOTE — Assessment & Plan Note (Signed)
Note: Medical Device Follow-up  Patient pacemaker was interrogated by pacemakers analyzer, battery status is okay.  No programming changes were indicated after the review of the data.  Histogram shows no change since the last interrogation Atrial and ventricular sensing thresholds were found to be acceptable Impedance was checked and it was found to be normal.  Thresholds were found to be okay on evaluation of rhythm problem.  No high rate or low rate arrhythmia were noted.  Estimated battery longevity is 22 months. I have personally reviewed the device data and amended the report as necessary.

## 2022-03-11 NOTE — Progress Notes (Signed)
Established Patient Office Visit  Subjective:  Patient ID: Sue Knapp, female    DOB: [birth year]  Age: 88 y.o. MRN: [id number]  CC:  Chief Complaint  Patient presents with   Pacemaker Check    HPI  Sue Knapp presents for pacer check and office visit  Past Medical History:  Diagnosis Date   Arthritis    Breast cancer [facility]) 2013   left breast, radiation and lumpectomy   Cataract    Fatigue    GERD (gastroesophageal reflux disease)    Glaucoma    Heart murmur    Heart palpitations    Hiatal hernia    HOH (hard of hearing)    AIDS   Hyperlipidemia    Hypertension    Hypothyroidism    Left ankle injury    Old left ankle injury   Left bundle branch block    Neuropathy    Osteoporosis    Pacemaker    Palpitations    Personal history of radiation therapy 2013   left breast ca   Scoliosis    Shortness of breath dyspnea    Thyroid nodule    TIA (transient ischemic attack)     Past Surgical History:  Procedure Laterality Date   ABDOMINAL HYSTERECTOMY     BREAST BIOPSY Left 2005   [place]   BREAST BIOPSY Left 2013   stereo bx, invasive tubular carcinoma and DCIS   BREAST LUMPECTOMY Left 02/21/2012   invasive tubular carcinoma and DCIS, clear margins, 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   BREAST SURGERY     LUMPECTOMY   CATARACT EXTRACTION W/PHACO Right 03/01/2015   Procedure: CATARACT EXTRACTION PHACO AND INTRAOCULAR LENS PLACEMENT ([place]);  Surgeon: Birder Robson, MD;  Location: [facility];  Service: Ophthalmology;  Laterality: Right;  US:1:03.1 AP:22.8 CDE:14.37  Fluid lot # [id number]   CATARACT EXTRACTION W/PHACO Left 03/22/2015   Procedure: CATARACT EXTRACTION PHACO AND INTRAOCULAR LENS PLACEMENT ([facility]);  Surgeon: Birder Robson, MD;  Location: [facility];  Service: Ophthalmology;  Laterality: Left;  [place]: 01:01.0 AP%: 22.5% CDE: 13.75 Lot # [id number]   ESOPHAGOGASTRODUODENOSCOPY (EGD) WITH PROPOFOL N/A 12/04/2020   Procedure: ESOPHAGOGASTRODUODENOSCOPY (EGD) WITH PROPOFOL;   Surgeon: Lin Landsman, MD;  Location: [place] [facility];  Service: Gastroenterology;  Laterality: N/A;   ESOPHAGOGASTRODUODENOSCOPY (EGD) WITH PROPOFOL N/A 12/04/2020   Procedure: ESOPHAGOGASTRODUODENOSCOPY (EGD) WITH PROPOFOL;  Surgeon: Lin Landsman, MD;  Location: [facility] END[facility]COPY;  Service: Gastroenterology;  Laterality: N/A;   EYE SURGERY  09/2020 and 10/2020   MASTECTOMY PARTIAL / LUMPECTOMY     THYROIDECTOMY, PARTIAL      Family History  Problem Relation Age of Onset   Breast cancer Neg Hx     Social History   Socioeconomic History   Marital status: Widowed    Spouse name: Not on file   Number of children: 3   Years of education: Not on file   Highest education level: High school graduate  Occupational History   Not on file  Tobacco Use   Smoking status: Never   Smokeless tobacco: Never  Substance and Sexual Activity   Alcohol use: No   Drug use: Never    Comment: pain clinic: tramadol   Sexual activity: Not Currently  Other Topics Concern   Not on file  Social History Narrative   Not on file   Social Determinants of Health   Financial Resource Strain: Low Risk  (07/11/2021)   Overall Financial Resource Strain (CARDIA)    Difficulty of Paying Living Expenses: Not  hard at all  Food Insecurity: No Food Insecurity (07/11/2021)   Hunger Vital Sign    Worried About Running Out of Food in the Last Year: Never true    Ran Out of Food in the Last Year: Never true  Transportation Needs: No Transportation Needs (07/11/2021)   PRAPARE - Hydrologist (Medical): No    Lack of Transportation (Non-Medical): No  Physical Activity: Inactive (07/11/2021)   Exercise Vital Sign    Days of Exercise per Week: 0 days    Minutes of Exercise per Session: 0 min  Stress: Stress Concern Present (07/11/2021)   [place]    Feeling of Stress : To some extent  Social Connections:  Moderately Integrated (07/11/2021)   Social Connection and Isolation Panel [NHANES]    Frequency of Communication with Friends and Family: More than three times a week    Frequency of Social Gatherings with Friends and Family: More than three times a week    Attends Religious Services: More than 4 times per year    Active Member of [employer] or Organizations: Yes    Attends Archivist Meetings: More than 4 times per year    Marital Status: Widowed  Intimate Partner Violence: Not At Risk (07/11/2021)   Humiliation, Afraid, Rape, and Kick questionnaire    Fear of Current or Ex-Partner: No    Emotionally Abused: No    Physically Abused: No    Sexually Abused: No     Current Outpatient Medications:    acetaminophen (TYLENOL) 650 MG CR tablet, Take 2 tablets (1,300 mg total) by mouth 2 (two) times daily as needed for pain., Disp: , Rfl:    albuterol (VENTOLIN HFA) 108 (90 Base) MCG/ACT inhaler, Inhale 2 puffs into the lungs every 6 (six) hours as needed for wheezing or shortness of breath., Disp: 8 g, Rfl: 4   ALPRAZolam (XANAX) 0.25 MG tablet, Take 0.25 mg by mouth daily as needed., Disp: , Rfl:    cephALEXin (KEFLEX) 500 MG capsule, Take 1 capsule (500 mg total) by mouth 2 (two) times daily., Disp: 14 capsule, Rfl: 0   Dentifrices (BIOTENE DRY MOUTH DT), Place onto teeth., Disp: , Rfl:    diclofenac Sodium (VOLTAREN) 1 % GEL, Apply topically., Disp: , Rfl:    docusate sodium (COLACE) 100 MG capsule, Take 1 capsule (100 mg total) by mouth 2 (two) times daily., Disp: 10 capsule, Rfl: 0   ELIQUIS 2.5 MG TABS tablet, TAKE 1 TABLET BY MOUTH TWICE DAILY, Disp: 60 tablet, Rfl: 6   metoprolol succinate (TOPROL-XL) 50 MG 24 hr tablet, TAKE 1 TABLET BY MOUTH DAILY, Disp: 30 tablet, Rfl: 6   NYAMYC powder, Apply topically., Disp: , Rfl:    psyllium (METAMUCIL) 58.6 % packet, Take 1 packet by mouth daily., Disp: 30 each, Rfl: 2   torsemide (DEMADEX) 20 MG tablet, Take 40 mg by mouth daily., Disp:  , Rfl:    traMADol (ULTRAM) 50 MG tablet, Take 1 tablet (50 mg total) by mouth 2 (two) times daily., Disp: 60 tablet, Rfl: 3   traMADol (ULTRAM-ER) 200 MG 24 hr tablet, Take 1 tablet (200 mg total) by mouth daily. To take at 730 pm after dinner, Disp: 30 tablet, Rfl: 3   trolamine salicylate (ASPERCREME/ALOE) 10 % cream, Apply 1 application topically as needed for muscle pain., Disp: , Rfl:    omeprazole (PRILOSEC OTC) 20 MG tablet, Take 1 tablet (20 mg  total) by mouth daily., Disp: 30 tablet, Rfl: 0   Allergies  Allergen Reactions   Codeine Other (See Comments)     Alt Ment Status (per notation in 2013)    ROS Review of Systems  Respiratory:  Negative for chest tightness.   Gastrointestinal: Negative.   Genitourinary:  Negative for dysuria.  Musculoskeletal:  Positive for myalgias and neck pain.  Skin:  Negative for pallor.      Objective:    Physical Exam Vitals reviewed.  Constitutional:      Appearance: Normal appearance.  HENT:     Mouth/Throat:     Mouth: Mucous membranes are moist.  Eyes:     Pupils: Pupils are equal, round, and reactive to light.  Neck:     Vascular: No carotid bruit.  Cardiovascular:     Rate and Rhythm: Normal rate and regular rhythm.     Pulses: Normal pulses.     Heart sounds: Normal heart sounds.  Pulmonary:     Effort: Pulmonary effort is normal.     Breath sounds: Normal breath sounds.  Abdominal:     General: Bowel sounds are normal.     Palpations: Abdomen is soft. There is no hepatomegaly, splenomegaly or mass.     Tenderness: There is no abdominal tenderness.     Hernia: No hernia is present.  Musculoskeletal:        General: No tenderness.     Cervical back: Neck supple.     Right lower leg: No edema.     Left lower leg: No edema.  Skin:    Findings: No rash.  Neurological:     Mental Status: She is alert and oriented to person, place, and time.     Motor: No weakness.  Psychiatric:        Mood and Affect: Mood and affect  normal.        Behavior: Behavior normal.     BP 132/72   Pulse 74  Wt Readings from Last 3 Encounters:  12/07/21 116 lb (52.6 kg)  11/27/21 116 lb (52.6 kg)  09/21/21 118 lb (53.5 kg)     Health Maintenance Due  Topic Date Due   Zoster Vaccines- Shingrix (1 of 2) Never done   Pneumonia Vaccine 35+ Years old (2 - PCV) 06/23/2021   COVID-19 Vaccine (6 - Pfizer risk series) 07/28/2021   INFLUENZA VACCINE  02/20/2022    There are no preventive care reminders to display for this patient.  Lab Results  Component Value Date   TSH 4.12 07/11/2021   Lab Results  Component Value Date   WBC 7.6 09/21/2021   HGB 11.6 (L) 09/21/2021   HCT 36.6 09/21/2021   MCV 92.9 09/21/2021   PLT 304 09/21/2021   Lab Results  Component Value Date   NA 141 09/21/2021   K 4.4 09/21/2021   CO2 31 09/21/2021   GLUCOSE 108 (H) 09/21/2021   BUN 60 (H) 09/21/2021   CREATININE 1.50 (H) 09/21/2021   BILITOT 1.0 07/11/2021   ALKPHOS 77 03/12/2021   AST 26 07/11/2021   ALT 20 07/11/2021   PROT 6.2 07/11/2021   ALBUMIN 3.2 (L) 03/12/2021   CALCIUM 9.4 09/21/2021   ANIONGAP 9 09/21/2021   EGFR 45 (L) 07/11/2021   Lab Results  Component Value Date   CHOL 183 07/11/2021   Lab Results  Component Value Date   HDL 46 (L) 07/11/2021   Lab Results  Component Value Date   LDLCALC 118 (H)  07/11/2021   Lab Results  Component Value Date   TRIG 86 07/11/2021   Lab Results  Component Value Date   CHOLHDL 4.0 07/11/2021   No results found for: "HGBA1C"    Assessment & Plan:   Problem List Items Addressed This Visit       Cardiovascular and Mediastinum   CHF (congestive heart failure) ([place])    Stable at the present time        Musculoskeletal and Integument   Primary osteoarthritis of right knee   Primary osteoarthritis of left knee   Osteoarthritis of right shoulder    Chronic problem due to osteoarthritis      Rotator cuff arthropathy of left shoulder     Other    Pacemaker    Note: Medical Device Follow-up  Patient pacemaker was interrogated by pacemakers analyzer, battery status is okay.  No programming changes were indicated after the review of the data.  Histogram shows no change since the last interrogation Atrial and ventricular sensing thresholds were found to be acceptable Impedance was checked and it was found to be normal.  Thresholds were found to be okay on evaluation of rhythm problem.  No high rate or low rate arrhythmia were noted.  Estimated battery longevity is 22 months. I have personally reviewed the device data and amended the report as necessary.       Other Visit Diagnoses     Cardiac pacemaker in situ    -  Primary   Relevant Orders   PACEMAKER IN CLINIC CHECK (Completed)       No orders of the defined types were placed in this encounter.   Follow-up: No follow-ups on file.    Cletis Athens, MD

## 2022-03-11 NOTE — Assessment & Plan Note (Signed)
Stable at the present time. 

## 2022-03-29 ENCOUNTER — Ambulatory Visit
Payer: Medicare Other | Attending: Student in an Organized Health Care Education/Training Program | Admitting: Student in an Organized Health Care Education/Training Program

## 2022-03-29 ENCOUNTER — Encounter: Payer: Self-pay | Admitting: Student in an Organized Health Care Education/Training Program

## 2022-03-29 DIAGNOSIS — M1712 Unilateral primary osteoarthritis, left knee: Secondary | ICD-10-CM | POA: Diagnosis not present

## 2022-03-29 DIAGNOSIS — G894 Chronic pain syndrome: Secondary | ICD-10-CM | POA: Diagnosis not present

## 2022-03-29 DIAGNOSIS — M1711 Unilateral primary osteoarthritis, right knee: Secondary | ICD-10-CM

## 2022-03-29 MED ORDER — TRAMADOL HCL 50 MG PO TABS: 50.0000 mg | ORAL_TABLET | Freq: Two times a day (BID) | ORAL | 3 refills | Status: DC

## 2022-03-29 MED ORDER — TRAMADOL HCL ER 200 MG PO TB24: 200.0000 mg | ORAL_TABLET | Freq: Every day | ORAL | 3 refills | Status: DC

## 2022-03-29 NOTE — Progress Notes (Signed)
Patient: Sue Knapp  Service Category: E/M  Provider: Gillis Santa, MD  DOB: Oct 02, 1922  DOS: 03/29/2022  Location: Office  MRN: [id number]  Setting: Ambulatory outpatient  Referring Provider: Kirk Ruths, MD  Type: Established Patient  Specialty: Interventional Pain Management  PCP: Kirk Ruths, MD  Location: Remote location  Delivery: TeleHealth     Virtual Encounter - Pain Management PROVIDER NOTE: Information contained herein reflects review and annotations entered in association with encounter. Interpretation of such information and data should be left to medically-trained personnel. Information provided to patient can be located elsewhere in the medical record under "Patient Instructions". Document created using STT-dictation technology, any transcriptional errors that may result from process are unintentional.    Contact & Pharmacy Preferred: [phone] Home: [phone] (home) Mobile: [phone] (mobile) E-mail: [email or web address]  [place], [place] - [place] [address] Phone: [phone] Fax: [phone]  [facility] - [place], [place] [address] [place] [address] Phone: [phone] Fax: [phone]  [facility] - [place], [place] - [address] [place] [address] Phone: [phone] Fax: [phone]   Pre-screening  Sue Knapp offered "in-person" vs "virtual" encounter. She indicated preferring virtual for this encounter.   Reason COVID-19*  Social distancing based on CDC and AMA recommendations.   I contacted Sue Knapp on 03/29/2022 via telephone.      I clearly identified myself as Gillis Santa, MD. I verified that I was speaking with the correct person using two identifiers (Name: Sue Knapp, and date of birth: September 08, 1922).  Consent I sought verbal advanced  consent from Sue Knapp for virtual visit interactions. I informed Sue Knapp of possible security and privacy concerns, risks, and limitations associated with providing "not-in-person" medical evaluation and management services. I also informed Sue Knapp of the availability of "in-person" appointments. Finally, I informed her that there would be a charge for the virtual visit and that she could be  personally, fully or partially, financially responsible for it. Sue Knapp expressed understanding and agreed to proceed.   Historic Elements   Sue Knapp is a 88 y.o. year old, female patient evaluated today after our last contact on 12/07/2021. Sue Knapp  has a past medical history of Arthritis, Breast cancer ([place]) (2013), Cataract, Fatigue, GERD (gastroesophageal reflux disease), Glaucoma, Heart murmur, Heart palpitations, Hiatal hernia, HOH (hard of hearing), Hyperlipidemia, Hypertension, Hypothyroidism, Left ankle injury, Left bundle branch block, Neuropathy, Osteoporosis, Pacemaker, Palpitations, Personal history of radiation therapy (2013), Scoliosis, Shortness of breath dyspnea, Thyroid nodule, and TIA (transient ischemic attack). She also  has a past surgical history that includes Thyroidectomy, partial; Abdominal hysterectomy; Breast surgery; Cataract extraction w/PHACO (Right, 03/01/2015); Cataract extraction w/PHACO (Left, 03/22/2015); Mastectomy partial / lumpectomy; Breast lumpectomy (Left, 02/21/2012); Breast biopsy (Left, 2005); Breast biopsy (Left, 2013); Eye surgery (09/2020 and 10/2020); Esophagogastroduodenoscopy (egd) with propofol (N/A, 12/04/2020); and Esophagogastroduodenoscopy (egd) with propofol (N/A, 12/04/2020). Sue Knapp has a current medication list which includes the following prescription(s): acetaminophen, albuterol, alprazolam, cephalexin, dentifrices, diclofenac sodium, docusate sodium, eliquis, metoprolol succinate, nyamyc, omeprazole, psyllium, torsemide,  aspercreme/aloe, tramadol, and tramadol. She  reports that she has never smoked. She has never used smokeless tobacco. She reports that she does not drink alcohol and does not use drugs. Sue Knapp is allergic to codeine.   HPI  Today, she is being contacted for medication  management.  No change in medical history since last visit.  Patient's pain is at baseline.  Patient continues multimodal pain regimen as prescribed.  States that it provides pain relief and improvement in functional status.   Laboratory Chemistry Profile   Renal Lab Results  Component Value Date   BUN 60 (H) 09/21/2021   CREATININE 1.50 (H) 09/21/2021   BCR 28 (H) 07/11/2021   GFRAA 37 (L) 11/28/2020   GFRNONAA 32 (L) 09/21/2021    Hepatic Lab Results  Component Value Date   AST 26 07/11/2021   ALT 20 07/11/2021   ALBUMIN 3.2 (L) 03/12/2021   ALKPHOS 77 03/12/2021   LIPASE 36 09/14/2019    Electrolytes Lab Results  Component Value Date   NA 141 09/21/2021   K 4.4 09/21/2021   CL 101 09/21/2021   CALCIUM 9.4 09/21/2021   MG 1.9 12/04/2020    Bone No results found for: "VD25OH", "VD125OH2TOT", "GB4532CJ4", "FO6594QO2", "25OHVITD1", "25OHVITD2", "25OHVITD3", "TESTOFREE", "TESTOSTERONE"  Inflammation (CRP: Acute Phase) (ESR: Chronic Phase) No results found for: "CRP", "ESRSEDRATE", "LATICACIDVEN"       Note: Above Lab results reviewed.  Imaging  DG HIP UNILAT WITH PELVIS 2-3 VIEWS RIGHT CLINICAL DATA:  Fall.  Right hip pain.  EXAM: DG HIP (WITH OR WITHOUT PELVIS) 2-3V RIGHT  COMPARISON:  10/27/2005  FINDINGS: Hip joints and SI joints are symmetric. No acute bony abnormality. Specifically, no fracture, subluxation, or dislocation. Degenerative changes in the visualized lower lumbar spine.  IMPRESSION: No acute bony abnormality.  Electronically Signed   By: Charlett Nose M.D.   On: 09/22/2021 00:37 CT HEAD WO CONTRAST ( ) CLINICAL DATA:  Trauma.  EXAM: CT HEAD WITHOUT  CONTRAST  TECHNIQUE: Contiguous axial images were obtained from the base of the skull through the vertex without intravenous contrast.  RADIATION DOSE REDUCTION: This exam was performed according to the departmental dose-optimization program which includes automated exposure control, adjustment of the mA and/or kV according to patient size and/or use of iterative reconstruction technique.  COMPARISON:  Head CT dated 03/12/2021.  FINDINGS: Brain: Mild age-related atrophy and chronic microvascular ischemic changes. There is no acute intracranial hemorrhage. No mass effect or midline shift. No extra-axial fluid collection.  Vascular: No hyperdense vessel or unexpected calcification.  Skull: Normal. Negative for fracture or focal lesion.  Sinuses/Orbits: No acute finding.  Other: Right parietal scalp hematoma.  IMPRESSION: 1. No acute intracranial pathology. 2. Mild age-related atrophy and chronic microvascular ischemic changes.  Electronically Signed   By: Elgie Collard M.D.   On: 09/22/2021 00:14  Assessment  Diagnoses of Primary osteoarthritis of right knee, Primary osteoarthritis of left knee, and Chronic pain syndrome were pertinent to this visit.  Plan of Care  Problem-specific:  No problem-specific Assessment & Plan notes found for this encounter.  Sue Knapp has a current medication list which includes the following long-term medication(s): albuterol, eliquis, metoprolol succinate, omeprazole, and torsemide.  Pharmacotherapy (Medications Ordered): Meds ordered this encounter  Medications   traMADol (ULTRAM-ER) 200 MG 24 hr tablet    Sig: Take 1 tablet (200 mg total) by mouth daily. To take at 730 pm after dinner    Dispense:  30 tablet    Refill:  3   traMADol (ULTRAM) 50 MG tablet    Sig: Take 1 tablet (50 mg total) by mouth 2 (two) times daily.    Dispense:  60 tablet    Refill:  3    At 9am  and 3pm  Follow-up plan:   Return in about  4 months (around 07/29/2022) for Medication Management, in person.     Status post right knee genicular nerve block 1 on 03/09/2019.  S/p right shoulder steroid injection 03/23/19. 05/04/2019:  R Genicular RFA- not effective.         Recent Visits No visits were found meeting these conditions. Showing recent visits within past 90 days and meeting all other requirements Today's Visits Date Type Provider Dept  03/29/22 Office Visit Gillis Santa, MD [facility]  Showing today's visits and meeting all other requirements Future Appointments No visits were found meeting these conditions. Showing future appointments within next 90 days and meeting all other requirements  I discussed the assessment and treatment plan with the patient. The patient was provided an opportunity to ask questions and all were answered. The patient agreed with the plan and demonstrated an understanding of the instructions.  Patient advised to call back or seek an in-person evaluation if the symptoms or condition worsens.  Duration of encounter: 43minutes.  Note by: Gillis Santa, MD Date: 03/29/2022; Time: 2:25 PM

## 2022-04-08 IMAGING — MG MM DIGITAL SCREENING BILAT W/ TOMO AND CAD
6 of 10 series · 6 of 30 positions shown · non-contrast
Comparison: Previous exam(s).

CLINICAL DATA: Screening.

EXAM:
DIGITAL SCREENING BILATERAL MAMMOGRAM WITH TOMOSYNTHESIS AND CAD
TECHNIQUE: Bilateral screening digital craniocaudal and mediolateral oblique
mammograms were obtained. Bilateral screening digital breast
tomosynthesis was performed. The images were evaluated with
computer-aided detection.

[L MLO synth-2D]
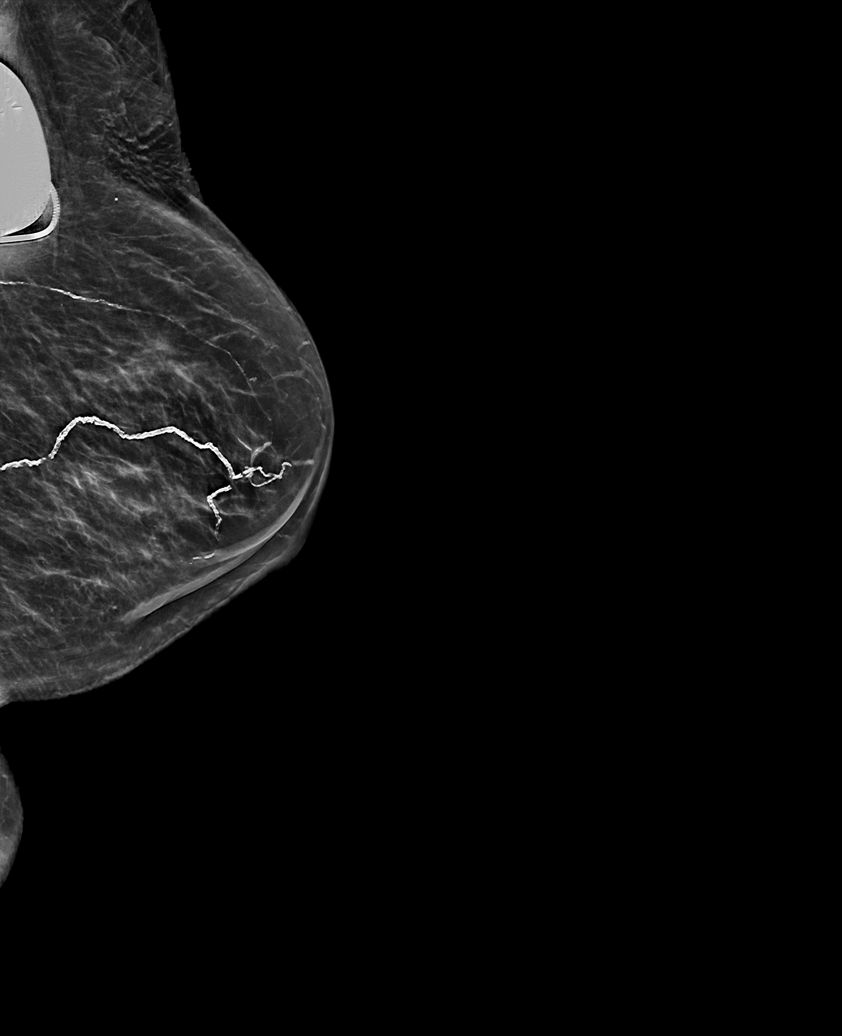

[L CC synth-2D (1 of 2)]
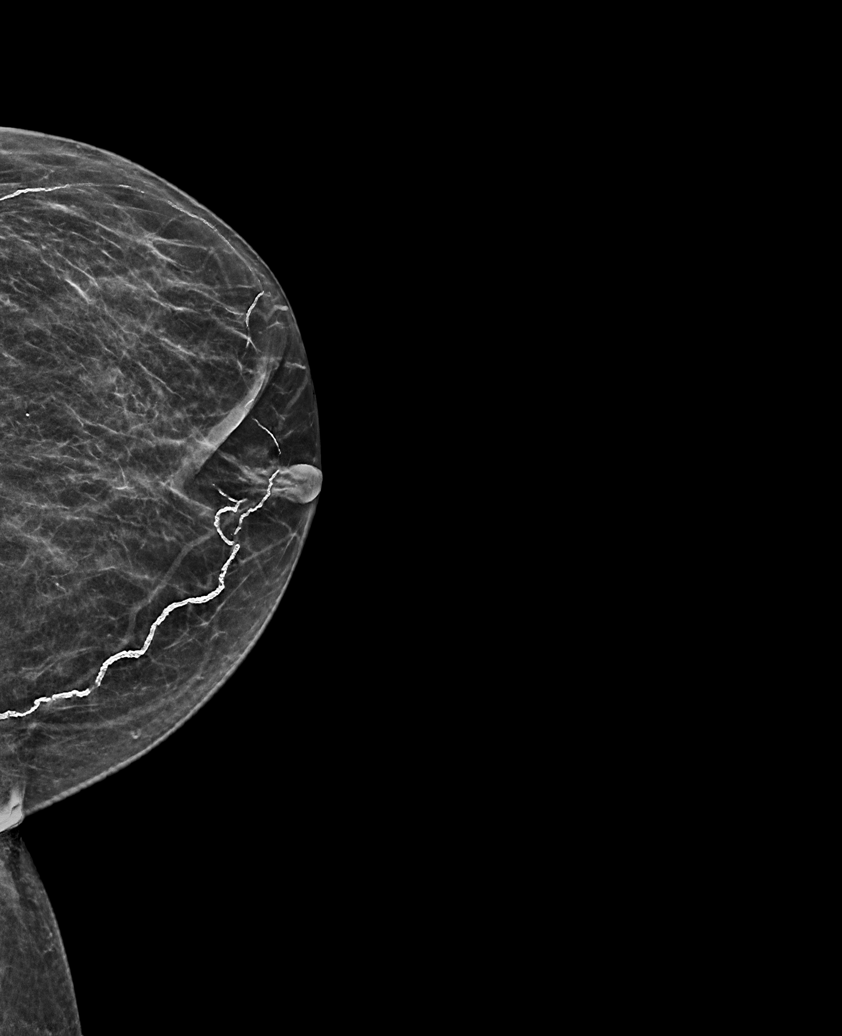

[R MLO synth-2D]
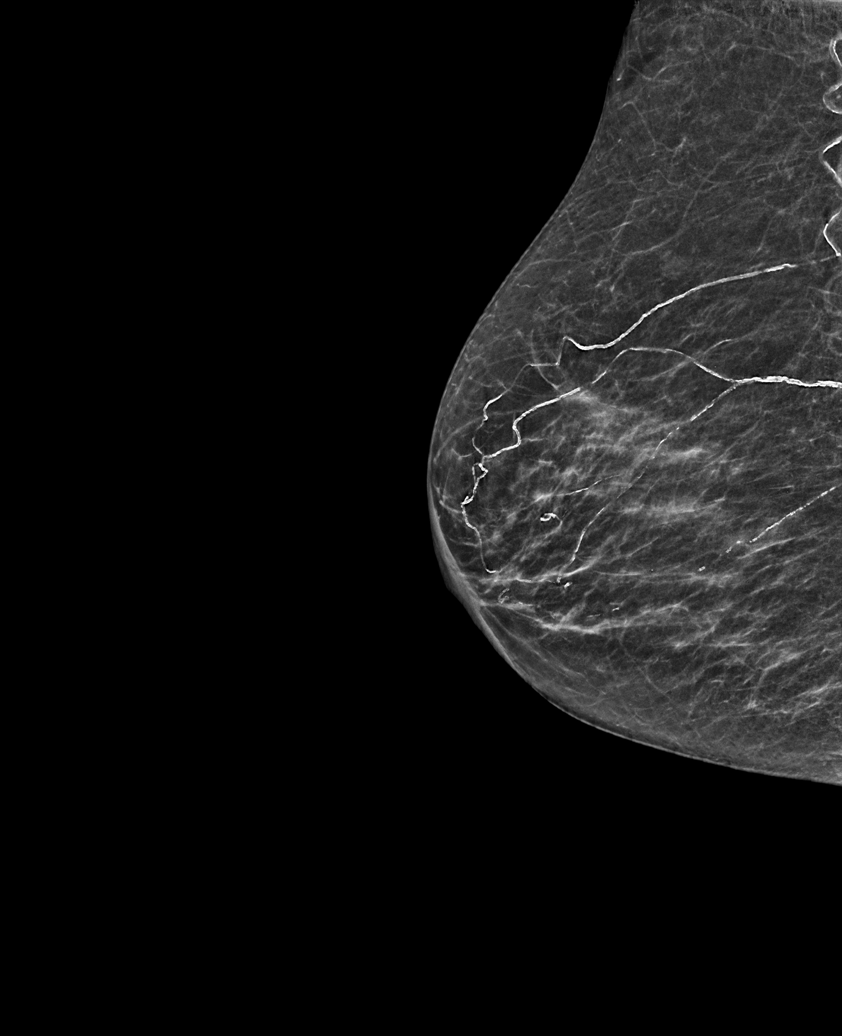

[R CC synth-2D]
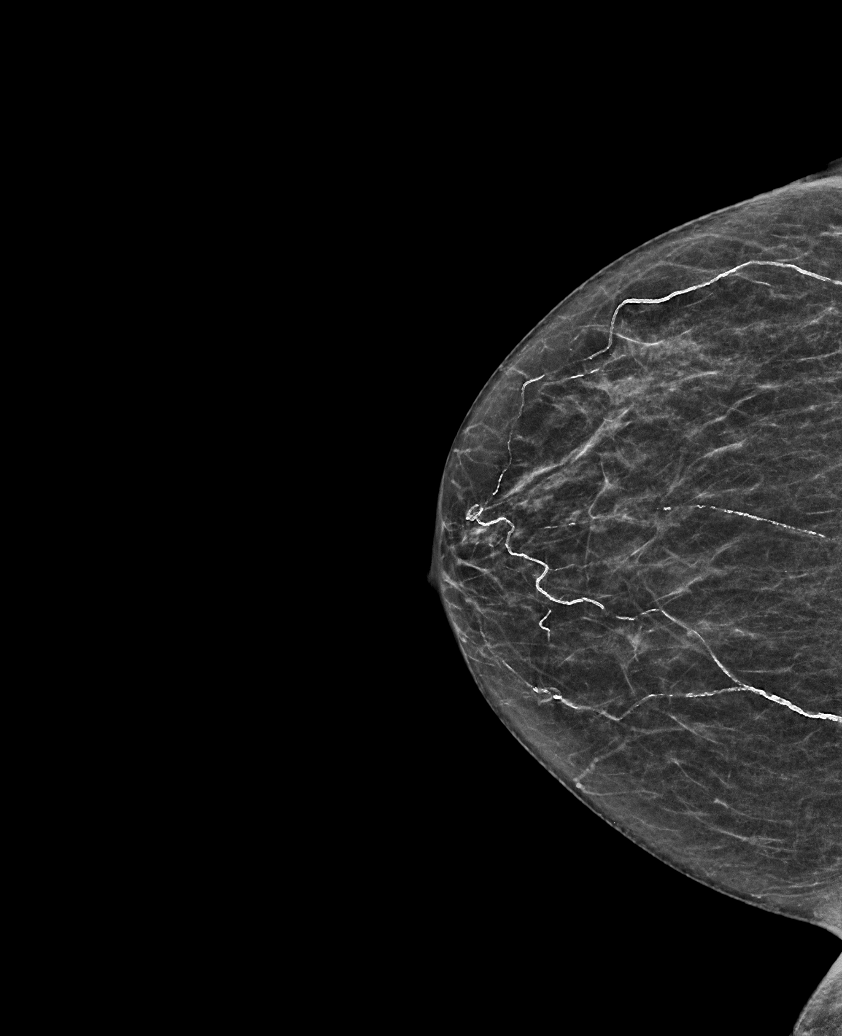

[L CC synth-2D (2 of 2)]
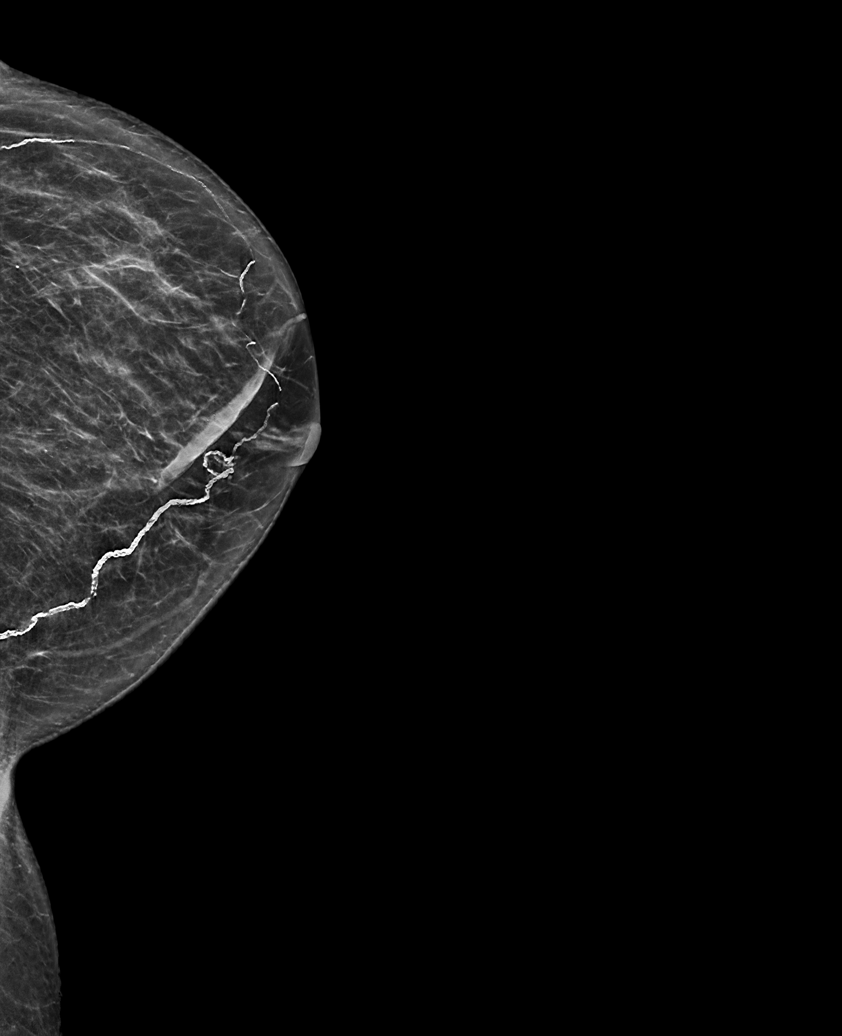

[L CC tomo · tomo slice 33/64.0]
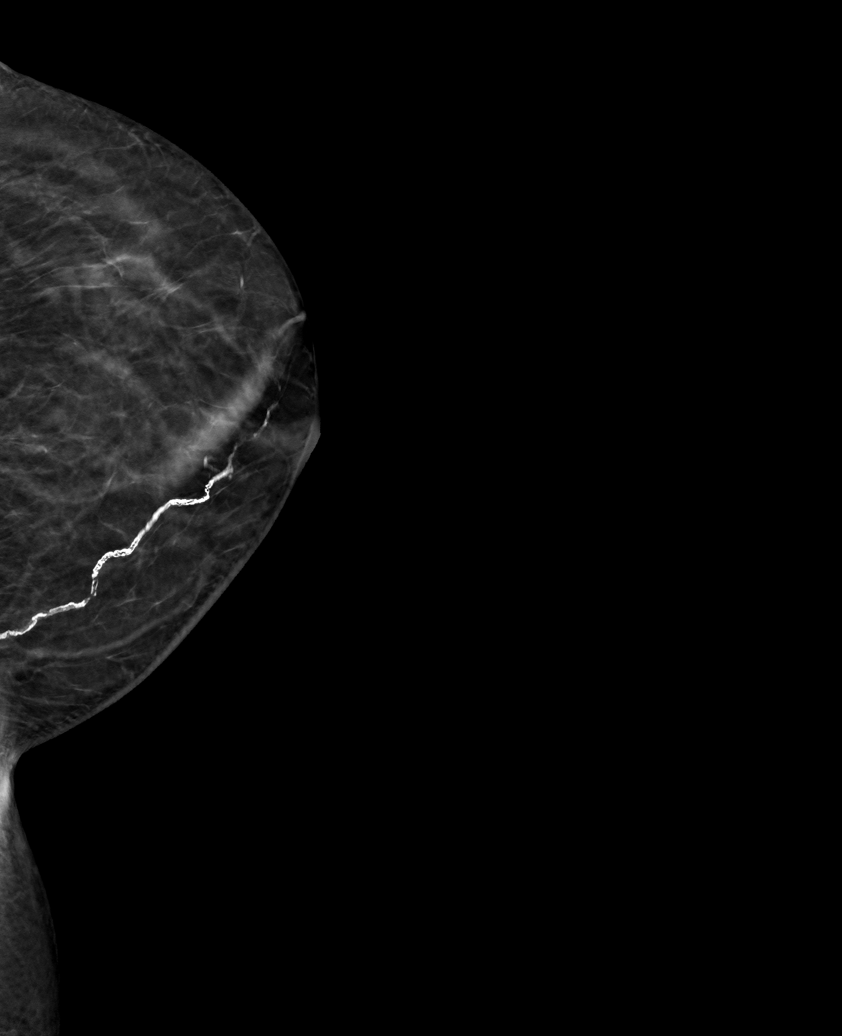

[6 of 30 positions shown; findings below may reference images not displayed]

ACR Breast Density Category b: There are scattered areas of
fibroglandular density.
FINDINGS: There are no findings suspicious for malignancy.
IMPRESSION: No mammographic evidence of malignancy. A result letter of this
screening mammogram will be mailed directly to the patient.

RECOMMENDATION:
Screening mammogram in one year. (Code:51-O-LD2)

BI-RADS CATEGORY  1: Negative.

## 2022-05-28 ENCOUNTER — Ambulatory Visit (INDEPENDENT_AMBULATORY_CARE_PROVIDER_SITE_OTHER): Payer: Medicare Other | Admitting: Internal Medicine

## 2022-05-28 VITALS — BP 128/70 | HR 74

## 2022-05-28 DIAGNOSIS — I495 Sick sinus syndrome: Secondary | ICD-10-CM | POA: Diagnosis not present

## 2022-05-28 DIAGNOSIS — Z950 Presence of cardiac pacemaker: Secondary | ICD-10-CM | POA: Diagnosis not present

## 2022-05-28 DIAGNOSIS — M12812 Other specific arthropathies, not elsewhere classified, left shoulder: Secondary | ICD-10-CM

## 2022-06-04 NOTE — Assessment & Plan Note (Signed)
Chronic problem. 

## 2022-06-04 NOTE — Progress Notes (Signed)
Established Patient Office Visit  Subjective:  Patient ID: Sue Knapp, female    DOB: 01-03-1923  Age: 88 y.o. MRN: [id number]  CC: No chief complaint on file.   HPI  Sue Knapp presents for pacemaker check no tachycardia no syncope patient is doing well  Past Medical History:  Diagnosis Date   Arthritis    Breast cancer ([place]) 2013   left breast, radiation and lumpectomy   Cataract    Fatigue    GERD (gastroesophageal reflux disease)    Glaucoma    Heart murmur    Heart palpitations    Hiatal hernia    HOH (hard of hearing)    AIDS   Hyperlipidemia    Hypertension    Hypothyroidism    Left ankle injury    Old left ankle injury   Left bundle branch block    Neuropathy    Osteoporosis    Pacemaker    Palpitations    Personal history of radiation therapy 2013   left breast ca   Scoliosis    Shortness of breath dyspnea    Thyroid nodule    TIA (transient ischemic attack)     Past Surgical History:  Procedure Laterality Date   ABDOMINAL HYSTERECTOMY     BREAST BIOPSY Left 2005   bengin   BREAST BIOPSY Left 2013   stereo bx, invasive tubular carcinoma and DCIS   BREAST LUMPECTOMY Left 02/21/2012   invasive tubular carcinoma and DCIS, clear margins, 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   BREAST SURGERY     LUMPECTOMY   CATARACT EXTRACTION W/PHACO Right 03/01/2015   Procedure: CATARACT EXTRACTION PHACO AND INTRAOCULAR LENS PLACEMENT ([place]);  Surgeon: Birder Robson, MD;  Location: [facility];  Service: Ophthalmology;  Laterality: Right;  US:1:03.1 AP:22.8 CDE:14.37  Fluid lot # [id number]   CATARACT EXTRACTION W/PHACO Left 03/22/2015   Procedure: CATARACT EXTRACTION PHACO AND INTRAOCULAR LENS PLACEMENT ([facility]);  Surgeon: Birder Robson, MD;  Location: [facility];  Service: Ophthalmology;  Laterality: Left;  [place]: 01:01.0 AP%: 22.5% CDE: 13.75 Lot # [id number]   ESOPHAGOGASTRODUODENOSCOPY (EGD) WITH PROPOFOL N/A 12/04/2020   Procedure: ESOPHAGOGASTRODUODENOSCOPY (EGD) WITH PROPOFOL;   Surgeon: Lin Landsman, MD;  Location: [facility];  Service: Gastroenterology;  Laterality: N/A;   ESOPHAGOGASTRODUODENOSCOPY (EGD) WITH PROPOFOL N/A 12/04/2020   Procedure: ESOPHAGOGASTRODUODENOSCOPY (EGD) WITH PROPOFOL;  Surgeon: Lin Landsman, MD;  Location: [facility]COPY;  Service: Gastroenterology;  Laterality: N/A;   EYE SURGERY  09/2020 and 10/2020   MASTECTOMY PARTIAL / LUMPECTOMY     THYROIDECTOMY, PARTIAL      Family History  Problem Relation Age of Onset   Breast cancer Neg Hx     Social History   Socioeconomic History   Marital status: Widowed    Spouse name: Not on file   Number of children: 3   Years of education: Not on file   Highest education level: High school graduate  Occupational History   Not on file  Tobacco Use   Smoking status: Never   Smokeless tobacco: Never  Substance and Sexual Activity   Alcohol use: No   Drug use: Never    Comment: pain clinic: tramadol   Sexual activity: Not Currently  Other Topics Concern   Not on file  Social History Narrative   Not on file   Social Determinants of Health   Financial Resource Strain: Low Risk  (07/11/2021)   Overall Financial Resource Strain (CARDIA)    Difficulty of Paying Living Expenses: Not hard at  all  Food Insecurity: No Food Insecurity (07/11/2021)   Hunger Vital Sign    Worried About Running Out of Food in the Last Year: Never true    Ran Out of Food in the Last Year: Never true  Transportation Needs: No Transportation Needs (07/11/2021)   PRAPARE - Hydrologist (Medical): No    Lack of Transportation (Non-Medical): No  Physical Activity: Inactive (07/11/2021)   Exercise Vital Sign    Days of Exercise per Week: 0 days    Minutes of Exercise per Session: 0 min  Stress: Stress Concern Present (07/11/2021)   [place]    Feeling of Stress : To some extent  Social Connections:  Moderately Integrated (07/11/2021)   Social Connection and Isolation Panel [NHANES]    Frequency of Communication with Friends and Family: More than three times a week    Frequency of Social Gatherings with Friends and Family: More than three times a week    Attends Religious Services: More than 4 times per year    Active Member of [employer] or Organizations: Yes    Attends Archivist Meetings: More than 4 times per year    Marital Status: Widowed  Intimate Partner Violence: Not At Risk (07/11/2021)   Humiliation, Afraid, Rape, and Kick questionnaire    Fear of Current or Ex-Partner: No    Emotionally Abused: No    Physically Abused: No    Sexually Abused: No     Current Outpatient Medications:    acetaminophen (TYLENOL) 650 MG CR tablet, Take 2 tablets (1,300 mg total) by mouth 2 (two) times daily as needed for pain., Disp: , Rfl:    albuterol (VENTOLIN HFA) 108 (90 Base) MCG/ACT inhaler, Inhale 2 puffs into the lungs every 6 (six) hours as needed for wheezing or shortness of breath., Disp: 8 g, Rfl: 4   ALPRAZolam (XANAX) 0.25 MG tablet, Take 0.25 mg by mouth daily as needed., Disp: , Rfl:    cephALEXin (KEFLEX) 500 MG capsule, Take 1 capsule (500 mg total) by mouth 2 (two) times daily., Disp: 14 capsule, Rfl: 0   Dentifrices (BIOTENE DRY MOUTH DT), Place onto teeth., Disp: , Rfl:    diclofenac Sodium (VOLTAREN) 1 % GEL, Apply topically., Disp: , Rfl:    docusate sodium (COLACE) 100 MG capsule, Take 1 capsule (100 mg total) by mouth 2 (two) times daily., Disp: 10 capsule, Rfl: 0   ELIQUIS 2.5 MG TABS tablet, TAKE 1 TABLET BY MOUTH TWICE DAILY, Disp: 60 tablet, Rfl: 6   metoprolol succinate (TOPROL-XL) 50 MG 24 hr tablet, TAKE 1 TABLET BY MOUTH DAILY, Disp: 30 tablet, Rfl: 6   NYAMYC powder, Apply topically., Disp: , Rfl:    omeprazole (PRILOSEC OTC) 20 MG tablet, Take 1 tablet (20 mg total) by mouth daily., Disp: 30 tablet, Rfl: 0   psyllium (METAMUCIL) 58.6 % packet, Take 1  packet by mouth daily., Disp: 30 each, Rfl: 2   torsemide (DEMADEX) 20 MG tablet, Take 40 mg by mouth daily., Disp: , Rfl:    traMADol (ULTRAM) 50 MG tablet, Take 1 tablet (50 mg total) by mouth 2 (two) times daily., Disp: 60 tablet, Rfl: 3   traMADol (ULTRAM-ER) 200 MG 24 hr tablet, Take 1 tablet (200 mg total) by mouth daily. To take at 730 pm after dinner, Disp: 30 tablet, Rfl: 3   trolamine salicylate (ASPERCREME/ALOE) 10 % cream, Apply 1 application topically as needed  for muscle pain., Disp: , Rfl:    Allergies  Allergen Reactions   Codeine Other (See Comments)     Alt Ment Status (per notation in 2013)    ROS Review of Systems  Constitutional: Negative.   HENT: Negative.    Eyes: Negative.   Respiratory: Negative.    Cardiovascular: Negative.   Gastrointestinal: Negative.   Endocrine: Negative.   Genitourinary: Negative.   Musculoskeletal: Negative.   Skin: Negative.   Allergic/Immunologic: Negative.   Neurological: Negative.   Hematological: Negative.   Psychiatric/Behavioral: Negative.    All other systems reviewed and are negative.     Objective:    Physical Exam Vitals reviewed.  Constitutional:      Appearance: Normal appearance.  HENT:     Mouth/Throat:     Mouth: Mucous membranes are moist.  Eyes:     Pupils: Pupils are equal, round, and reactive to light.  Neck:     Vascular: No carotid bruit.  Cardiovascular:     Rate and Rhythm: Normal rate and regular rhythm.     Pulses: Normal pulses.     Heart sounds: Normal heart sounds.  Pulmonary:     Effort: Pulmonary effort is normal.     Breath sounds: Normal breath sounds.  Abdominal:     General: Bowel sounds are normal.     Palpations: Abdomen is soft. There is no hepatomegaly, splenomegaly or mass.     Tenderness: There is no abdominal tenderness.     Hernia: No hernia is present.  Musculoskeletal:        General: No tenderness.     Cervical back: Neck supple.     Right lower leg: No edema.      Left lower leg: No edema.  Skin:    Findings: No rash.  Neurological:     Mental Status: She is alert and oriented to person, place, and time.     Motor: No weakness.  Psychiatric:        Mood and Affect: Mood and affect normal.        Behavior: Behavior normal.     BP 128/70   Pulse 74  Wt Readings from Last 3 Encounters:  12/07/21 116 lb (52.6 kg)  11/27/21 116 lb (52.6 kg)  09/21/21 118 lb (53.5 kg)     Health Maintenance Due  Topic Date Due   Zoster Vaccines- Shingrix (1 of 2) Never done   Pneumonia Vaccine 75+ Years old (2 - PCV) 06/23/2021   COVID-19 Vaccine (6 - Pfizer risk series) 07/28/2021   INFLUENZA VACCINE  02/20/2022   Medicare Annual Wellness (AWV)  07/11/2022    There are no preventive care reminders to display for this patient.  Lab Results  Component Value Date   TSH 4.12 07/11/2021   Lab Results  Component Value Date   WBC 7.6 09/21/2021   HGB 11.6 (L) 09/21/2021   HCT 36.6 09/21/2021   MCV 92.9 09/21/2021   PLT 304 09/21/2021   Lab Results  Component Value Date   NA 141 09/21/2021   K 4.4 09/21/2021   CO2 31 09/21/2021   GLUCOSE 108 (H) 09/21/2021   BUN 60 (H) 09/21/2021   CREATININE 1.50 (H) 09/21/2021   BILITOT 1.0 07/11/2021   ALKPHOS 77 03/12/2021   AST 26 07/11/2021   ALT 20 07/11/2021   PROT 6.2 07/11/2021   ALBUMIN 3.2 (L) 03/12/2021   CALCIUM 9.4 09/21/2021   ANIONGAP 9 09/21/2021   EGFR 45 (L) 07/11/2021  Lab Results  Component Value Date   CHOL 183 07/11/2021   Lab Results  Component Value Date   HDL 46 (L) 07/11/2021   Lab Results  Component Value Date   LDLCALC 118 (H) 07/11/2021   Lab Results  Component Value Date   TRIG 86 07/11/2021   Lab Results  Component Value Date   CHOLHDL 4.0 07/11/2021   No results found for: "HGBA1C"    Assessment & Plan:   Problem List Items Addressed This Visit       Cardiovascular and Mediastinum   Sick sinus syndrome due to SA node dysfunction ([place]) - Primary     Pacemaker is working well.        Musculoskeletal and Integument   Rotator cuff arthropathy of left shoulder    Chronic problem      Note: Medical Device Follow-up  Patient pacemaker was interrogated by pacemakers analyzer, battery status is okay.  No programming changes were indicated after the review of the data.  Histogram shows no change since the last interrogation Atrial and ventricular sensing thresholds were found to be acceptable Impedance was checked and it was found to be normal.  Thresholds were found to be okay on evaluation of rhythm problem.  No high rate or low rate arrhythmia were noted.  Estimated battery longevity is MORE THAN 6 months. I have personally reviewed the device data and amended the report as necessary.   No orders of the defined types were placed in this encounter.   Follow-up: No follow-ups on file.    Cletis Athens, MD

## 2022-06-04 NOTE — Assessment & Plan Note (Signed)
Pacemaker is working well.

## 2022-07-24 ENCOUNTER — Telehealth: Payer: Self-pay | Admitting: Student in an Organized Health Care Education/Training Program

## 2022-07-24 NOTE — Telephone Encounter (Signed)
PT daughter stated that last time mother had MM appt, Dr Holley Raring agreed that patient could have VV appt due to her age and being so hard to get patient in and out. PT is scheduled for appt tomorrow. Please let me know if I can change appt to VV. Thanks

## 2022-07-24 NOTE — Telephone Encounter (Signed)
PT can be reach at [phone] or [phone] for her VV appt on 07-26-22. PT is in an assist home. Daughter gave these two number that the patient can be reach at. Thanks

## 2022-07-25 ENCOUNTER — Telehealth: Payer: Self-pay

## 2022-07-25 NOTE — Telephone Encounter (Signed)
Attempted to call for pre virtual appointment questions.  No answering machine and unable to leave message.

## 2022-07-26 ENCOUNTER — Ambulatory Visit
Payer: Medicare Other | Attending: Student in an Organized Health Care Education/Training Program | Admitting: Student in an Organized Health Care Education/Training Program

## 2022-07-26 ENCOUNTER — Encounter: Payer: Self-pay | Admitting: Student in an Organized Health Care Education/Training Program

## 2022-07-26 DIAGNOSIS — G894 Chronic pain syndrome: Secondary | ICD-10-CM

## 2022-07-26 DIAGNOSIS — M1712 Unilateral primary osteoarthritis, left knee: Secondary | ICD-10-CM | POA: Diagnosis not present

## 2022-07-26 DIAGNOSIS — M1711 Unilateral primary osteoarthritis, right knee: Secondary | ICD-10-CM

## 2022-07-26 MED ORDER — TRAMADOL HCL 50 MG PO TABS: 50.0000 mg | ORAL_TABLET | Freq: Two times a day (BID) | ORAL | 3 refills | Status: DC

## 2022-07-26 MED ORDER — TRAMADOL HCL ER 200 MG PO TB24: 200.0000 mg | ORAL_TABLET | Freq: Every day | ORAL | 3 refills | Status: DC

## 2022-07-26 NOTE — Progress Notes (Signed)
Patient: Sue Knapp  Service Category: E/M  Provider: Gillis Santa, MD  DOB: [birth year]  DOS: 07/26/2022  Location: Office  MRN: [id number]  Setting: Ambulatory outpatient  Referring Provider: Kirk Ruths, MD  Type: Established Patient  Specialty: Interventional Pain Management  PCP: Kirk Ruths, MD  Location: Remote location  Delivery: TeleHealth     Virtual Encounter - Pain Management PROVIDER NOTE: Information contained herein reflects review and annotations entered in association with encounter. Interpretation of such information and data should be left to medically-trained personnel. Information provided to patient can be located elsewhere in the medical record under "Patient Instructions". Document created using STT-dictation technology, any transcriptional errors that may result from process are unintentional.    Contact & Pharmacy Preferred: [phone] Home: [phone] (home) Mobile: [phone] (mobile) E-mail: No e-mail address on record  [place], [place] - [place] [address] [place] [address] Phone: [phone] Fax: [phone]  [facility] [place], [place] [address] [place] [address] Phone: [phone] Fax: [phone]  [facility] - [place], [place] - [address] [place] [address] Phone: [phone] Fax: [phone]   Pre-screening  Sue Knapp offered "in-person" vs "virtual" encounter. She indicated preferring virtual for this encounter.   Reason COVID-19*  Social distancing based on CDC and AMA recommendations.   I contacted Sue Knapp on 07/26/2022 via telephone.      I clearly identified myself as Gillis Santa, MD. I verified that I was speaking with the correct person using two identifiers (Name: Sue Knapp, and date of birth: April 20, 1923).  Consent I sought verbal advanced  consent from Sue Knapp for virtual visit interactions. I informed Sue Knapp of possible security and privacy concerns, risks, and limitations associated with providing "not-in-person" medical evaluation and management services. I also informed Sue Knapp of the availability of "in-person" appointments. Finally, I informed her that there would be a charge for the virtual visit and that she could be  personally, fully or partially, financially responsible for it. Sue Knapp expressed understanding and agreed to proceed.   Historic Elements   Sue Knapp is a 89 y.o. year old, female patient evaluated today after our last contact on 07/24/2022. Sue Knapp  has a past medical history of Arthritis, Breast cancer ([place]) (2013), Cataract, Fatigue, GERD (gastroesophageal reflux disease), Glaucoma, Heart murmur, Heart palpitations, Hiatal hernia, HOH (hard of hearing), Hyperlipidemia, Hypertension, Hypothyroidism, Left ankle injury, Left bundle branch block, Neuropathy, Osteoporosis, Pacemaker, Palpitations, Personal history of radiation therapy (2013), Scoliosis, Shortness of breath dyspnea, Thyroid nodule, and TIA (transient ischemic attack). She also  has a past surgical history that includes Thyroidectomy, partial; Abdominal hysterectomy; Breast surgery; Cataract extraction w/PHACO (Right, 03/01/2015); Cataract extraction w/PHACO (Left, 03/22/2015); Mastectomy partial / lumpectomy; Breast lumpectomy (Left, 02/21/2012); Breast biopsy (Left, 2005); Breast biopsy (Left, 2013); Eye surgery (09/2020 and 10/2020); Esophagogastroduodenoscopy (egd) with propofol (N/A, 12/04/2020); and Esophagogastroduodenoscopy (egd) with propofol (N/A, 12/04/2020). Sue Knapp has a current medication list which includes the following prescription(s): acetaminophen, albuterol, alprazolam, cephalexin, dentifrices, diclofenac sodium, docusate sodium, eliquis, metoprolol succinate, nyamyc, omeprazole, psyllium, torsemide, tramadol,  tramadol, and aspercreme/aloe. She  reports that she has never smoked. She has never used smokeless tobacco. She reports that she does not drink alcohol and does not use drugs. Sue Knapp is allergic to codeine.  Estimated body mass index is 22.65  kg/m as calculated from the following:   Height as of 12/07/21: 5' (1.524 m).   Weight as of 12/07/21: 116 lb (52.6 kg).  HPI  Today, she is being contacted for medication management.   Patient has been having pain in her left hand along with swelling. She saw Dr. Frazier Richards and he gave her antibiotic Augmentin for 7days for cellulitis, and prednisone for gout. patient states she is much better in just the 2 days of starting the prednisone.  Otherwise, continues Tramadol as prescribed, no change in dose  Pharmacotherapy Assessment   Opioid Analgesic: Tramadol 20 mg ER daily, Tramadol 50 mg IR BID prn  Monitoring: [place] PMP: PDMP reviewed during this encounter.       Pharmacotherapy: No side-effects or adverse reactions reported. Compliance: No problems identified. Effectiveness: Clinically acceptable. Plan: Refer to "POC". UDS: No results found for: "SUMMARY" No results found for: "CBDTHCR", "D8THCCBX", "D9THCCBX"   Laboratory Chemistry Profile   Renal Lab Results  Component Value Date   BUN 60 (H) 09/21/2021   CREATININE 1.50 (H) 09/21/2021   BCR 28 (H) 07/11/2021   GFRAA 37 (L) 11/28/2020   GFRNONAA 32 (L) 09/21/2021    Hepatic Lab Results  Component Value Date   AST 26 07/11/2021   ALT 20 07/11/2021   ALBUMIN 3.2 (L) 03/12/2021   ALKPHOS 77 03/12/2021   LIPASE 36 09/14/2019    Electrolytes Lab Results  Component Value Date   NA 141 09/21/2021   K 4.4 09/21/2021   CL 101 09/21/2021   CALCIUM 9.4 09/21/2021   MG 1.9 12/04/2020    Bone No results found for: "VD25OH", "VD125OH2TOT", "PI9518AC1", "YS0630ZS0", "25OHVITD1", "25OHVITD2", "25OHVITD3", "TESTOFREE", "TESTOSTERONE"  Inflammation (CRP: Acute Phase) (ESR:  Chronic Phase) No results found for: "CRP", "ESRSEDRATE", "LATICACIDVEN"       Note: Above Lab results reviewed.  Imaging  DG HIP UNILAT WITH PELVIS 2-3 VIEWS RIGHT CLINICAL DATA:  Fall.  Right hip pain.  EXAM: DG HIP (WITH OR WITHOUT PELVIS) 2-3V RIGHT  COMPARISON:  10/27/2005  FINDINGS: Hip joints and SI joints are symmetric. No acute bony abnormality. Specifically, no fracture, subluxation, or dislocation. Degenerative changes in the visualized lower lumbar spine.  IMPRESSION: No acute bony abnormality.  Electronically Signed   By: Rolm Baptise M.D.   On: 09/22/2021 00:37 CT HEAD WO CONTRAST (5MM) CLINICAL DATA:  Trauma.  EXAM: CT HEAD WITHOUT CONTRAST  TECHNIQUE: Contiguous axial images were obtained from the base of the skull through the vertex without intravenous contrast.  RADIATION DOSE REDUCTION: This exam was performed according to the departmental dose-optimization program which includes automated exposure control, adjustment of the mA and/or kV according to patient size and/or use of iterative reconstruction technique.  COMPARISON:  Head CT dated 03/12/2021.  FINDINGS: Brain: Mild age-related atrophy and chronic microvascular ischemic changes. There is no acute intracranial hemorrhage. No mass effect or midline shift. No extra-axial fluid collection.  Vascular: No hyperdense vessel or unexpected calcification.  Skull: Normal. Negative for fracture or focal lesion.  Sinuses/Orbits: No acute finding.  Other: Right parietal scalp hematoma.  IMPRESSION: 1. No acute intracranial pathology. 2. Mild age-related atrophy and chronic microvascular ischemic changes.  Electronically Signed   By: Anner Crete M.D.   On: 09/22/2021 00:14  Assessment  Diagnoses of Chronic pain syndrome, Primary osteoarthritis of left knee, and Primary osteoarthritis of right knee were pertinent to this visit.  Plan of Care   Sue Knapp has a current  medication list  which includes the following long-term medication(s): albuterol, eliquis, metoprolol succinate, omeprazole, and torsemide.  Pharmacotherapy (Medications Ordered): Meds ordered this encounter  Medications   traMADol (ULTRAM) 50 MG tablet    Sig: Take 1 tablet (50 mg total) by mouth 2 (two) times daily.    Dispense:  60 tablet    Refill:  3    At 9am  and 3pm   traMADol (ULTRAM-ER) 200 MG 24 hr tablet    Sig: Take 1 tablet (200 mg total) by mouth daily. To take at 730 pm after dinner    Dispense:  30 tablet    Refill:  3   Orders:  No orders of the defined types were placed in this encounter.  Follow-up plan:   Return in about 4 months (around 11/24/2022) for Medication Management, in person.     Status post right knee genicular nerve block 1 on 03/09/2019.  S/p right shoulder steroid injection 03/23/19. 05/04/2019:  R Genicular RFA- not effective.          Recent Visits No visits were found meeting these conditions. Showing recent visits within past 90 days and meeting all other requirements Today's Visits Date Type Provider Dept  07/26/22 Office Visit Gillis Santa, MD [facility]  Showing today's visits and meeting all other requirements Future Appointments No visits were found meeting these conditions. Showing future appointments within next 90 days and meeting all other requirements  I discussed the assessment and treatment plan with the patient. The patient was provided an opportunity to ask questions and all were answered. The patient agreed with the plan and demonstrated an understanding of the instructions.  Patient advised to call back or seek an in-person evaluation if the symptoms or condition worsens.  Duration of encounter: 30 minutes.  Note by: Gillis Santa, MD Date: 07/26/2022; Time: 2:42 PM

## 2022-09-05 ENCOUNTER — Other Ambulatory Visit: Payer: Self-pay | Admitting: Internal Medicine

## 2022-09-05 DIAGNOSIS — Z1231 Encounter for screening mammogram for malignant neoplasm of breast: Secondary | ICD-10-CM

## 2022-10-08 ENCOUNTER — Emergency Department: Payer: Medicare Other

## 2022-10-08 ENCOUNTER — Emergency Department
Admission: EM | Admit: 2022-10-08 | Discharge: 2022-10-08 | Disposition: A | Discharge status: Home / Self Care | Payer: Medicare Other | Attending: Emergency Medicine | Admitting: Emergency Medicine

## 2022-10-08 DIAGNOSIS — S7002XA Contusion of left hip, initial encounter: Secondary | ICD-10-CM | POA: Diagnosis not present

## 2022-10-08 DIAGNOSIS — S0990XA Unspecified injury of head, initial encounter: Secondary | ICD-10-CM | POA: Diagnosis present

## 2022-10-08 DIAGNOSIS — Z7901 Long term (current) use of anticoagulants: Secondary | ICD-10-CM | POA: Insufficient documentation

## 2022-10-08 DIAGNOSIS — S5002XA Contusion of left elbow, initial encounter: Secondary | ICD-10-CM | POA: Diagnosis not present

## 2022-10-08 DIAGNOSIS — W1812XA Fall from or off toilet with subsequent striking against object, initial encounter: Secondary | ICD-10-CM | POA: Diagnosis not present

## 2022-10-08 DIAGNOSIS — W19XXXA Unspecified fall, initial encounter: Secondary | ICD-10-CM

## 2022-10-08 NOTE — ED Triage Notes (Signed)
Patient fell when she was sitting on the toilet; She landed on her LEFT elbow and LEFT hip; She states that she feels she doesn't need to be here because she does not believe anything is broken and is able to move all her extremities, states that the fall was very minor; She did hit the LEFT side of her head and takes Eliquis daily

## 2022-10-08 NOTE — ED Provider Notes (Signed)
[facility] Provider Note  Patient Contact: 5:40 PM (approximate)   History   Fall (Patient fell when she was sitting on the toilet; She landed on her LEFT elbow and LEFT hip; She states that she feels she doesn't need to be here because she does not believe anything is broken and is able to move all her extremities, states that the fall was very minor; She did hit the LEFT side of her head and takes Eliquis daily)   HPI  Sue Knapp is a 89 y.o. female who presents emergency department via EMS from long-term care facility.  Patient was using the restroom when she fell off the toilet landing on her left side.  Patient did hit her head but did not lose consciousness.  No visual changes, unilateral weakness, slurred speech.  Patient is on Eliquis and given the fact that she did hit her head facility wanted her evaluated to ensure no evidence of intracranial hemorrhage.  Patient has no headache, no ecchymosis, hematoma of the scalp.  Patient states that she does not feel like she needs to be here but was sent at the facility's wishes.  She did hit her elbow and hip of the left side as well.  Denies any loss of range of motion.  Patient is largely asymptomatic at this time.     Physical Exam   Triage Vital Signs: ED Triage Vitals  Enc Vitals Group     BP 10/08/22 1613 (!) 145/66     Pulse Rate 10/08/22 1613 68     Resp 10/08/22 1613 18     Temp 10/08/22 1613 98.1 F (36.7 C)     Temp Source 10/08/22 1613 Oral     SpO2 10/08/22 1613 95 %     Weight --      Height --      Head Circumference --      Peak Flow --      Pain Score 10/08/22 1618 2     Pain Loc --      Pain Edu? --      Excl. in [place]? --     Most recent vital signs: Vitals:   10/08/22 1613  BP: (!) 145/66  Pulse: 68  Resp: 18  Temp: 98.1 F (36.7 C)  SpO2: 95%     General: Alert and in no acute distress. Eyes:  PERRL. EOMI. Head: No acute traumatic findings.  No open wounds.  No  large hematomas.  No battle signs, raccoon eyes, serosanguineous fluid drainage from the ears or nares. Neck: No stridor. No cervical spine tenderness to palpation.  Cardiovascular:  Good peripheral perfusion Respiratory: Normal respiratory effort without tachypnea or retractions. Lungs CTAB. Good air entry to the bases with no decreased or absent breath sounds Musculoskeletal: Full range of motion to all extremities.  Visualization of her extremities reveals no obvious signs of trauma.  Moving all extremities appropriately.  No tenderness over elbow or hip.  No palpable abnormality.  No shortening or rotation of the lower extremity. Neurologic:  No gross focal neurologic deficits are appreciated.  Cranial nerves II through XII grossly intact. Skin:   No rash noted Other:   ED Results / Procedures / Treatments   Labs (all labs ordered are listed, but only abnormal results are displayed) Labs Reviewed - No data to display   EKG     RADIOLOGY  I personally viewed, evaluated, and interpreted these images as part of my medical decision making,  as well as reviewing the written report by the radiologist.  ED Provider Interpretation: Evaluation of imaging reveals no acute findings.  No acute intracranial hemorrhage, skull fracture, fractures to the elbow or hip.  CT Head Wo Contrast  Result Date: 10/08/2022 CLINICAL DATA:  Fall. EXAM: CT HEAD WITHOUT CONTRAST TECHNIQUE: Contiguous axial images were obtained from the base of the skull through the vertex without intravenous contrast. RADIATION DOSE REDUCTION: This exam was performed according to the departmental dose-optimization program which includes automated exposure control, adjustment of the mA and/or kV according to patient size and/or use of iterative reconstruction technique. COMPARISON:  CT head dated September 22, 2021. FINDINGS: Brain: No evidence of acute infarction, hemorrhage, hydrocephalus, extra-axial collection or mass lesion/mass  effect. Stable mild atrophy and chronic microvascular ischemic changes. Vascular: Calcified atherosclerosis at the skull base. No hyperdense vessel. Skull: Normal. Negative for fracture or focal lesion. Sinuses/Orbits: Complete opacification of the right sphenoid sinus. Remaining paranasal sinuses are clear. The orbits are unremarkable. Other: None. IMPRESSION: 1. No acute intracranial abnormality. Electronically Signed   By: Titus Dubin M.D.   On: 10/08/2022 17:24   DG Hip Unilat W or Wo Pelvis 2-3 Views Left  Result Date: 10/08/2022 CLINICAL DATA:  Fall EXAM: DG HIP (WITH OR WITHOUT PELVIS) 3V LEFT COMPARISON:  None Available. FINDINGS: There is no evidence of hip fracture or dislocation. There is no evidence of arthropathy or other focal bone abnormality. IMPRESSION: Negative. Electronically Signed   By: Sammie Bench M.D.   On: 10/08/2022 16:56   DG Elbow Complete Left  Result Date: 10/08/2022 CLINICAL DATA:  Trauma EXAM: LEFT ELBOW - COMPLETE 4 VIEW COMPARISON:  None Available. FINDINGS: There is no evidence of fracture, dislocation, or joint effusion. There is no evidence of arthropathy or other focal bone abnormality. Soft tissues are unremarkable. IMPRESSION: Negative. Electronically Signed   By: Sammie Bench M.D.   On: 10/08/2022 16:55    PROCEDURES:  Critical Care performed: No  Procedures   MEDICATIONS ORDERED IN ED: Medications - No data to display   IMPRESSION / MDM / [place] / ED COURSE  I reviewed the triage vital signs and the nursing notes.                                 Differential diagnosis includes, but is not limited to, fall, skull fracture, head injury, concussion, intracranial hemorrhage, elbow contusion, hip fracture, hip contusion   Patient's presentation is most consistent with acute presentation with potential threat to life or bodily function.   Patient's diagnosis is consistent with fall, minor head injury, elbow and hip  contusion.  Patient presents emergency department after a low mechanism fall.  She fell off the toilet landing on her left side.  She did hit her head but did not lose consciousness.  Patient has no complaints plaints at this time.  She states that she does not feel like she needs to be here but was sent by the facility to ensure there was no evidence of head injury as she is on blood thinners.  Imaging of the head, elbow and hip are reassuring.  Patient is neurologically intact.  Stable for discharge at this time.  Follow-up primary care as needed..  Patient is given ED precautions to return to the ED for any worsening or new symptoms.     FINAL CLINICAL IMPRESSION(S) / ED DIAGNOSES   Final diagnoses:  Fall, initial encounter  Minor head injury, initial encounter  Contusion of left hip, initial encounter  Contusion of left elbow, initial encounter     Rx / DC Orders   ED Discharge Orders     None        Note:  This document was prepared using Dragon voice recognition software and may include unintentional dictation errors.   Darletta Moll, PA-C 10/08/22 1816    Harvest Dark, MD 10/08/22 [phone]

## 2022-10-08 NOTE — ED Notes (Signed)
First nurse note-pt brought in via ems from [facility] [place].  Per ems, pt slid off the toilet and hit head.  No loc  no vomiting.  Pt is on blood thinners.  Pt alert and oriented.  Pt in wheelchair in lobby.

## 2022-10-17 ENCOUNTER — Ambulatory Visit
Admission: RE | Admit: 2022-10-17 | Discharge: 2022-10-17 | Disposition: A | Discharge status: Home / Self Care | Payer: Medicare Other | Source: Ambulatory Visit | Attending: Internal Medicine | Admitting: Internal Medicine

## 2022-10-17 DIAGNOSIS — Z1231 Encounter for screening mammogram for malignant neoplasm of breast: Secondary | ICD-10-CM | POA: Diagnosis present

## 2022-11-20 ENCOUNTER — Encounter: Payer: Self-pay | Admitting: Student in an Organized Health Care Education/Training Program

## 2022-11-20 ENCOUNTER — Ambulatory Visit
Payer: Medicare Other | Attending: Student in an Organized Health Care Education/Training Program | Admitting: Student in an Organized Health Care Education/Training Program

## 2022-11-20 DIAGNOSIS — G894 Chronic pain syndrome: Secondary | ICD-10-CM

## 2022-11-20 DIAGNOSIS — M1711 Unilateral primary osteoarthritis, right knee: Secondary | ICD-10-CM

## 2022-11-20 DIAGNOSIS — M1712 Unilateral primary osteoarthritis, left knee: Secondary | ICD-10-CM

## 2022-11-20 MED ORDER — TRAMADOL HCL 50 MG PO TABS: 50.0000 mg | ORAL_TABLET | Freq: Two times a day (BID) | ORAL | 3 refills | Status: DC

## 2022-11-20 MED ORDER — TRAMADOL HCL ER 200 MG PO TB24: 200.0000 mg | ORAL_TABLET | Freq: Every day | ORAL | 3 refills | Status: DC

## 2022-11-20 NOTE — Progress Notes (Signed)
Patient: Sue Knapp  Service Category: E/M  Provider: Edward Jolly, MD  DOB: 08-Sep-1922  DOS: 11/20/2022  Location: Office  MRN: [id number]  Setting: Ambulatory outpatient  Referring Provider: Lauro Regulus, MD  Type: Established Patient  Specialty: Interventional Pain Management  PCP: Lauro Regulus, MD  Location: Remote location  Delivery: TeleHealth     Virtual Encounter - Pain Management PROVIDER NOTE: Information contained herein reflects review and annotations entered in association with encounter. Interpretation of such information and data should be left to medically-trained personnel. Information provided to patient can be located elsewhere in the medical record under "Patient Instructions". Document created using STT-dictation technology, any transcriptional errors that may result from process are unintentional.    Contact & Pharmacy Preferred: [phone] Home: [phone] (home) Mobile: [phone] (mobile) E-mail: No e-mail address on record  [facility], [place] - [address] [place] [address] Phone: [phone] Fax: [phone]  [facility] - [place], [place] - [address] [place] [address] Phone: [phone] Fax: [phone]  [facility] - [place], [place] - [address] [place] [address] Phone: [phone] Fax: [phone]   Pre-screening  Sue Knapp offered "in-person" vs "virtual" encounter. She indicated preferring virtual for this encounter.   Reason COVID-19*  Social distancing based on CDC and AMA recommendations.   I contacted Sue Knapp on 11/20/2022 via telephone.      I clearly identified myself as Edward Jolly, MD. I verified that I was speaking with the correct person using two identifiers (Name: Sue Knapp, and date of birth: 10-04-1922).  Consent I sought verbal advanced  consent from Sue Knapp for virtual visit interactions. I informed Sue Knapp of possible security and privacy concerns, risks, and limitations associated with providing "not-in-person" medical evaluation and management services. I also informed Sue Knapp of the availability of "in-person" appointments. Finally, I informed her that there would be a charge for the virtual visit and that she could be  personally, fully or partially, financially responsible for it. Sue Knapp expressed understanding and agreed to proceed.   Historic Elements   Sue Knapp is a 89 y.o. year old, female patient evaluated today after our last contact on 07/24/2022. Sue Knapp  has a past medical history of Arthritis, Breast cancer (HCC) (2013), Cataract, Fatigue, GERD (gastroesophageal reflux disease), Glaucoma, Heart murmur, Heart palpitations, Hiatal hernia, HOH (hard of hearing), Hyperlipidemia, Hypertension, Hypothyroidism, Left ankle injury, Left bundle branch block, Neuropathy, Osteoporosis, Pacemaker, Palpitations, Personal history of radiation therapy (2013), Scoliosis, Shortness of breath dyspnea, Thyroid nodule, and TIA (transient ischemic attack). She also  has a past surgical history that includes Thyroidectomy, partial; Abdominal hysterectomy; Breast surgery; Cataract extraction w/PHACO (Right, 03/01/2015); Cataract extraction w/PHACO (Left, 03/22/2015); Mastectomy partial / lumpectomy; Breast lumpectomy (Left, 02/21/2012); Breast biopsy (Left, 2005); Breast biopsy (Left, 2013); Eye surgery (09/2020 and 10/2020); Esophagogastroduodenoscopy (egd) with propofol (N/A, 12/04/2020); and Esophagogastroduodenoscopy (egd) with propofol (N/A, 12/04/2020). Sue Knapp has a current medication list which includes the following prescription(s): acetaminophen, alprazolam, dentifrices, diclofenac sodium, docusate sodium, eliquis, losartan, metoprolol succinate, psyllium, albuterol, cephalexin, nyamyc, omeprazole, torsemide,  tramadol, tramadol, and aspercreme/aloe. She  reports that she has never smoked. She has never used smokeless tobacco. She reports that she does not drink alcohol and does not use drugs. Sue Knapp is allergic to codeine.  Estimated body mass index is  22.65 kg/m as calculated from the following:   Height as of 12/07/21: 5' (1.524 m).   Weight as of 12/07/21: 116 lb (52.6 kg).  HPI  Today, she is being contacted for medication management.   No change in medical history since last visit.  Patient's pain is at baseline.  Patient continues multimodal pain regimen as prescribed.  States that it provides pain relief and improvement in functional status. Dicussed using a heating pad to help alleviate muscle tension Continue with APAP 500 mg TID prn  Pharmacotherapy Assessment   Opioid Analgesic: Tramadol 200 mg ER daily, Tramadol 50 mg IR BID prn  Monitoring: [place] PMP: PDMP reviewed during this encounter.       Pharmacotherapy: No side-effects or adverse reactions reported. Compliance: No problems identified. Effectiveness: Clinically acceptable. Plan: Refer to "POC". UDS: No results found for: "SUMMARY" No results found for: "CBDTHCR", "D8THCCBX", "D9THCCBX"   Laboratory Chemistry Profile   Renal Lab Results  Component Value Date   BUN 60 (H) 09/21/2021   CREATININE 1.50 (H) 09/21/2021   BCR 28 (H) 07/11/2021   GFRAA 37 (L) 11/28/2020   GFRNONAA 32 (L) 09/21/2021    Hepatic Lab Results  Component Value Date   AST 26 07/11/2021   ALT 20 07/11/2021   ALBUMIN 3.2 (L) 03/12/2021   ALKPHOS 77 03/12/2021   LIPASE 36 09/14/2019    Electrolytes Lab Results  Component Value Date   NA 141 09/21/2021   K 4.4 09/21/2021   CL 101 09/21/2021   CALCIUM 9.4 09/21/2021   MG 1.9 12/04/2020    Bone No results found for: "VD25OH", "VD125OH2TOT", "ZO1096EA5", "WU9811BJ4", "25OHVITD1", "25OHVITD2", "25OHVITD3", "TESTOFREE", "TESTOSTERONE"  Inflammation (CRP: Acute Phase) (ESR: Chronic  Phase) No results found for: "CRP", "ESRSEDRATE", "LATICACIDVEN"       Note: Above Lab results reviewed.  Imaging  MM 3D SCREEN BREAST BILATERAL CLINICAL DATA:  Screening.  EXAM: DIGITAL SCREENING BILATERAL MAMMOGRAM WITH TOMOSYNTHESIS AND CAD  TECHNIQUE: Bilateral screening digital craniocaudal and mediolateral oblique mammograms were obtained. Bilateral screening digital breast tomosynthesis was performed. The images were evaluated with computer-aided detection.  COMPARISON:  Previous exam(s).  ACR Breast Density Category b: There are scattered areas of fibroglandular density.  FINDINGS: There are no findings suspicious for malignancy.  IMPRESSION: No mammographic evidence of malignancy. A result letter of this screening mammogram will be mailed directly to the patient.  RECOMMENDATION: Screening mammogram in one year. (Code:SM-B-01Y)  BI-RADS CATEGORY  1: Negative.  Electronically Signed   By: Sherian Rein M.D.   On: 10/18/2022 11:03  Assessment  Diagnoses of Chronic pain syndrome, Primary osteoarthritis of left knee, and Primary osteoarthritis of right knee were pertinent to this visit.  Plan of Care   Sue Knapp has a current medication list which includes the following long-term medication(s): eliquis, metoprolol succinate, albuterol, omeprazole, and torsemide.  Pharmacotherapy (Medications Ordered): Meds ordered this encounter  Medications   traMADol (ULTRAM) 50 MG tablet    Sig: Take 1 tablet (50 mg total) by mouth 2 (two) times daily.    Dispense:  60 tablet    Refill:  3    At 9am  and 3pm   traMADol (ULTRAM-ER) 200 MG 24 hr tablet    Sig: Take 1 tablet (200 mg total) by mouth daily. To take at 730 pm after dinner    Dispense:  30 tablet    Refill:  3  -Continue with APAP -Consider heating pad  Orders:  No orders of the  defined types were placed in this encounter.  Follow-up plan:   Return in about 4 months (around 03/22/2023) for  Medication Management, virtual.     Status post right knee genicular nerve block 1 on 03/09/2019.  S/p right shoulder steroid injection 03/23/19. 05/04/2019:  R Genicular RFA- not effective.          Recent Visits No visits were found meeting these conditions. Showing recent visits within past 90 days and meeting all other requirements Today's Visits Date Type Provider Dept  11/20/22 Office Visit Edward Jolly, MD [facility]  Showing today's visits and meeting all other requirements Future Appointments No visits were found meeting these conditions. Showing future appointments within next 90 days and meeting all other requirements  I discussed the assessment and treatment plan with the patient. The patient was provided an opportunity to ask questions and all were answered. The patient agreed with the plan and demonstrated an understanding of the instructions.  Patient advised to call back or seek an in-person evaluation if the symptoms or condition worsens.  Duration of encounter: 30 minutes.  Note by: Edward Jolly, MD Date: 11/20/2022; Time: 3:08 PM

## 2023-01-07 ENCOUNTER — Ambulatory Visit: Payer: Medicare Other | Admitting: Physician Assistant

## 2023-01-17 ENCOUNTER — Ambulatory Visit: Payer: Medicare Other | Admitting: Physician Assistant

## 2023-05-09 ENCOUNTER — Other Ambulatory Visit: Payer: Self-pay

## 2023-05-09 ENCOUNTER — Telehealth: Payer: Self-pay

## 2023-05-09 NOTE — Telephone Encounter (Signed)
Her daughter called and said the patients care home called her and told her that she is out of her long acting 24 hour tramadol. They say the doctor has to renew this prescription.

## 2023-05-13 ENCOUNTER — Ambulatory Visit
Payer: Medicare Other | Attending: Student in an Organized Health Care Education/Training Program | Admitting: Student in an Organized Health Care Education/Training Program

## 2023-05-13 DIAGNOSIS — M170 Bilateral primary osteoarthritis of knee: Secondary | ICD-10-CM | POA: Diagnosis not present

## 2023-05-13 DIAGNOSIS — M1711 Unilateral primary osteoarthritis, right knee: Secondary | ICD-10-CM

## 2023-05-13 DIAGNOSIS — S46001S Unspecified injury of muscle(s) and tendon(s) of the rotator cuff of right shoulder, sequela: Secondary | ICD-10-CM

## 2023-05-13 DIAGNOSIS — M1712 Unilateral primary osteoarthritis, left knee: Secondary | ICD-10-CM

## 2023-05-13 DIAGNOSIS — G894 Chronic pain syndrome: Secondary | ICD-10-CM

## 2023-05-13 DIAGNOSIS — M19011 Primary osteoarthritis, right shoulder: Secondary | ICD-10-CM

## 2023-05-13 DIAGNOSIS — M19111 Post-traumatic osteoarthritis, right shoulder: Secondary | ICD-10-CM

## 2023-05-13 MED ORDER — HYDROCODONE-ACETAMINOPHEN 5-325 MG PO TABS: 1.0000 | ORAL_TABLET | Freq: Three times a day (TID) | ORAL | 0 refills | Status: DC | PRN

## 2023-05-13 NOTE — Progress Notes (Signed)
Patient: Sue Knapp  Service Category: E/M  Provider: Edward Jolly, MD  DOB: 02/27/1923  DOS: 05/13/2023  Location: Office  MRN: [id number]  Setting: Ambulatory outpatient  Referring Provider: Lauro Regulus, MD  Type: Established Patient  Specialty: Interventional Pain Management  PCP: Lauro Regulus, MD  Location: Remote location  Delivery: TeleHealth     Virtual Encounter - Pain Management PROVIDER NOTE: Information contained herein reflects review and annotations entered in association with encounter. Interpretation of such information and data should be left to medically-trained personnel. Information provided to patient can be located elsewhere in the medical record under "Patient Instructions". Document created using STT-dictation technology, any transcriptional errors that may result from process are unintentional.    Contact & Pharmacy Preferred: [phone] Home: [phone] (home) Mobile: [phone] (mobile) E-mail: No e-mail address on record  [facility], [place] - [address] [place] [address] Phone: [phone] Fax: [phone]  [facility] - [place], [place] - [address] [place] [address] Phone: [phone] Fax: [phone]  [facility] - [place], [place] - [address] [place] [address] Phone: [phone] Fax: [phone]   Pre-screening  [name] offered "in-person" vs "virtual" encounter. She indicated preferring virtual for this encounter.   Reason COVID-19*  Social distancing based on CDC and AMA recommendations.   I contacted [name] on 05/13/2023 via telephone.      I clearly identified myself as Edward Jolly, MD. I verified that I was speaking with the correct person using two identifiers (Name: MARRIANNA Knapp, and date of birth: 02-26-1923).  Consent I sought verbal advanced  consent from [name] for virtual visit interactions. I informed Ms. Gilles of possible security and privacy concerns, risks, and limitations associated with providing "not-in-person" medical evaluation and management services. I also informed Ms. Tornero of the availability of "in-person" appointments. Finally, I informed her that there would be a charge for the virtual visit and that she could be  personally, fully or partially, financially responsible for it. Ms. Pert expressed understanding and agreed to proceed.   Historic Elements   [name] is a 89 y.o. year old, female patient evaluated today after our last contact on 07/24/2022. [name]  has a past medical history of Arthritis, Breast cancer (HCC) (2013), Cataract, Fatigue, GERD (gastroesophageal reflux disease), Glaucoma, Heart murmur, Heart palpitations, Hiatal hernia, HOH (hard of hearing), Hyperlipidemia, Hypertension, Hypothyroidism, Left ankle injury, Left bundle branch block, Neuropathy, Osteoporosis, Pacemaker, Palpitations, Personal history of radiation therapy (2013), Scoliosis, Shortness of breath dyspnea, Thyroid nodule, and TIA (transient ischemic attack). She also  has a past surgical history that includes Thyroidectomy, partial; Abdominal hysterectomy; Breast surgery; Cataract extraction w/PHACO (Right, 03/01/2015); Cataract extraction w/PHACO (Left, 03/22/2015); Mastectomy partial / lumpectomy; Breast lumpectomy (Left, 02/21/2012); Breast biopsy (Left, 2005); Breast biopsy (Left, 2013); Eye surgery (09/2020 and 10/2020); Esophagogastroduodenoscopy (egd) with propofol (N/A, 12/04/2020); and Esophagogastroduodenoscopy (egd) with propofol (N/A, 12/04/2020). [name] has a current medication list which includes the following prescription(s): hydrocodone-acetaminophen, [START ON 06/12/2023] hydrocodone-acetaminophen, [START ON 07/12/2023] hydrocodone-acetaminophen, acetaminophen, albuterol, alprazolam, cephalexin,  dentifrices, diclofenac sodium, docusate sodium, eliquis, losartan, metoprolol succinate, nyamyc, omeprazole, psyllium, torsemide, and aspercreme/aloe. She  reports that she has never smoked. She has never used smokeless tobacco. She reports that she does not drink alcohol and does not use drugs. [name] is allergic to  codeine.  Estimated body mass index is 22.65 kg/m as calculated from the following:   Height as of 12/07/21: 5' (1.524 m).   Weight as of 12/07/21: 116 lb (52.6 kg).  HPI  Today, she is being contacted for medication management.  Unfortunately, the patient is struggling with increased volume myalgias and arthralgias in her mid back low back bilateral hips and bilateral knees.  She states that her tramadol has become less effective. She was previously on 200 mg ER nightly along with tramadol IR 50 mg twice daily. Given lack of analgesic or functional benefit with this, we discussed discontinuing tramadol and trying hydrocodone instead.  Risks and benefits reviewed and patient would like to proceed.   Pharmacotherapy Assessment   Opioid Analgesic: Hydrocodone 5 mg every 8 hours as needed  Monitoring:  PMP: PDMP reviewed during this encounter.       Pharmacotherapy: No side-effects or adverse reactions reported. Compliance: No problems identified. Effectiveness: Clinically acceptable. Plan: Refer to "POC". UDS: No results found for: "SUMMARY" No results found for: "CBDTHCR", "D8THCCBX", "D9THCCBX"   Laboratory Chemistry Profile   Renal Lab Results  Component Value Date   BUN 60 (H) 09/21/2021   CREATININE 1.50 (H) 09/21/2021   BCR 28 (H) 07/11/2021   GFRAA 37 (L) 11/28/2020   GFRNONAA 32 (L) 09/21/2021    Hepatic Lab Results  Component Value Date   AST 26 07/11/2021   ALT 20 07/11/2021   ALBUMIN 3.2 (L) 03/12/2021   ALKPHOS 77 03/12/2021   LIPASE 36 09/14/2019    Electrolytes Lab Results  Component Value Date   NA 141 09/21/2021   K 4.4 09/21/2021    CL 101 09/21/2021   CALCIUM 9.4 09/21/2021   MG 1.9 12/04/2020    Bone No results found for: "VD25OH", "VD125OH2TOT", "GM0102VO5", "DG6440HK7", "25OHVITD1", "25OHVITD2", "25OHVITD3", "TESTOFREE", "TESTOSTERONE"  Inflammation (CRP: Acute Phase) (ESR: Chronic Phase) No results found for: "CRP", "ESRSEDRATE", "LATICACIDVEN"       Note: Above Lab results reviewed.  Imaging  MM 3D SCREEN BREAST BILATERAL CLINICAL DATA:  Screening.  EXAM: DIGITAL SCREENING BILATERAL MAMMOGRAM WITH TOMOSYNTHESIS AND CAD  TECHNIQUE: Bilateral screening digital craniocaudal and mediolateral oblique mammograms were obtained. Bilateral screening digital breast tomosynthesis was performed. The images were evaluated with computer-aided detection.  COMPARISON:  Previous exam(s).  ACR Breast Density Category b: There are scattered areas of fibroglandular density.  FINDINGS: There are no findings suspicious for malignancy.  IMPRESSION: No mammographic evidence of malignancy. A result letter of this screening mammogram will be mailed directly to the patient.  RECOMMENDATION: Screening mammogram in one year. (Code:SM-B-01Y)  BI-RADS CATEGORY  1: Negative.  Electronically Signed   By: Sherian Rein M.D.   On: 10/18/2022 11:03  Assessment  The primary encounter diagnosis was Chronic pain syndrome. 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  Plan of Care   Ms. NANEA CRAIGE has a current medication list which includes the following long-term medication(s): albuterol, eliquis, metoprolol succinate, omeprazole, and torsemide.  Pharmacotherapy (Medications Ordered): Meds ordered this encounter  Medications   HYDROcodone-acetaminophen (NORCO/VICODIN)  5-325 MG tablet    Sig: Take 1 tablet by mouth every 8 (eight) hours as needed for severe pain (pain score 7-10). Must last 30 days.    Dispense:  90 tablet    Refill:  0    Chronic Pain: STOP Act (Not applicable) Fill 1 day early if closed on refill date. Avoid benzodiazepines within 8 hours of opioids   HYDROcodone-acetaminophen (NORCO/VICODIN) 5-325 MG tablet    Sig: Take 1 tablet by mouth every 8 (eight) hours as needed for severe pain (pain score 7-10). Must last 30 days.    Dispense:  90 tablet    Refill:  0    Chronic Pain: STOP Act (Not applicable) Fill 1 day early if closed on refill date. Avoid benzodiazepines within 8 hours of opioids   HYDROcodone-acetaminophen (NORCO/VICODIN) 5-325 MG tablet    Sig: Take 1 tablet by mouth every 8 (eight) hours as needed for severe pain (pain score 7-10). Must last 30 days.    Dispense:  90 tablet    Refill:  0    Chronic Pain: STOP Act (Not applicable) Fill 1 day early if closed on refill date. Avoid benzodiazepines within 8 hours of opioids  -Continue with APAP 500 mg twice daily as needed.  Even with her hydrocodone/acetaminophen prescription, her total daily dose of acetaminophen does not exceed 2 g which is still less than maximum dose -Consider heating pad   Follow-up plan:   Return in about 3 months (around 08/13/2023) for MM, VV.     Status post right knee genicular nerve block 1 on 03/09/2019.  S/p right shoulder steroid injection 03/23/19. 05/04/2019:  R Genicular RFA- not effective.          Recent Visits No visits were found meeting these conditions. Showing recent visits within past 90 days and meeting all other requirements Today's Visits Date Type Provider Dept  05/13/23 Office Visit Edward Jolly, MD [facility]  Showing today's visits and meeting all other requirements Future Appointments No visits were found meeting these conditions. Showing future appointments within next 90 days and meeting all other  requirements  I discussed the assessment and treatment plan with the patient. The patient was provided an opportunity to ask questions and all were answered. The patient agreed with the plan and demonstrated an understanding of the instructions.  Patient advised to call back or seek an in-person evaluation if the symptoms or condition worsens.  Duration of encounter: 30 minutes.  Note by: Edward Jolly, MD Date: 05/13/2023; Time: 3:51 PM

## 2023-05-14 ENCOUNTER — Telehealth: Payer: Self-pay | Admitting: Student in an Organized Health Care Education/Training Program

## 2023-05-14 ENCOUNTER — Telehealth: Payer: Self-pay

## 2023-05-14 DIAGNOSIS — G894 Chronic pain syndrome: Secondary | ICD-10-CM

## 2023-05-14 NOTE — Telephone Encounter (Signed)
Daughter is concerned about patient taking medication with codeine in it. Patient has a lot of reactions to this medication. She is requesting something different. Please call Okey Regal to discuss this.

## 2023-05-14 NOTE — Telephone Encounter (Signed)
PT daughter called states that the hydrocodone doesn't work good for her mother. She will rather her mother be switch back to Tramadol. Please give daughter a call. TY

## 2023-05-15 ENCOUNTER — Telehealth: Payer: Self-pay | Admitting: Student in an Organized Health Care Education/Training Program

## 2023-05-15 ENCOUNTER — Other Ambulatory Visit: Payer: Self-pay | Admitting: Pain Medicine

## 2023-05-15 ENCOUNTER — Other Ambulatory Visit: Payer: Self-pay

## 2023-05-15 DIAGNOSIS — G894 Chronic pain syndrome: Secondary | ICD-10-CM

## 2023-05-15 MED ORDER — TRAMADOL HCL 50 MG PO TABS: 50.0000 mg | ORAL_TABLET | Freq: Two times a day (BID) | ORAL | 3 refills | Status: DC | PRN

## 2023-05-15 MED ORDER — TRAMADOL HCL ER 200 MG PO TB24: 200.0000 mg | ORAL_TABLET | Freq: Every day | ORAL | 3 refills | Status: DC

## 2023-05-15 NOTE — Telephone Encounter (Signed)
Patient cannot take Oxycodone. Please ask Dr Cherylann Ratel to change her medication. Daughter would like to speak with Dr Cherylann Ratel.

## 2023-05-15 NOTE — Telephone Encounter (Signed)
Informed patinet daughter that the Hydrocodone has been cancelled and new script for Tramadol 50 mg and Tramadol 200 mg have been sent to [facility] pharmacy.

## 2023-05-15 NOTE — Addendum Note (Signed)
Addended by: Edward Jolly on: 05/15/2023 01:04 PM   Modules accepted: Orders

## 2023-05-15 NOTE — Telephone Encounter (Signed)
All Hydrocodone prescriptions cancelled at [facility] long term pharmacy.

## 2023-05-15 NOTE — Telephone Encounter (Signed)
Putting her back on her previous dose of tramadol, both the short acting and long-acting.  She is to continue taking that as she was previously.  Call pharmacy and cancel hydrocodone prescriptions.  Next visit has to be face-to-face.

## 2023-05-15 NOTE — Telephone Encounter (Signed)
This patient daughter has called twice today rude, states that she needs to speaks to you because you put her mother on hydrocodone, states that if she doesn't get a call back today she will remove her mother from the clinic. Please give patient daughter a call. TY

## 2023-05-21 ENCOUNTER — Other Ambulatory Visit: Payer: Self-pay | Admitting: Internal Medicine

## 2023-05-21 DIAGNOSIS — Z1231 Encounter for screening mammogram for malignant neoplasm of breast: Secondary | ICD-10-CM

## 2023-07-08 ENCOUNTER — Ambulatory Visit: Payer: Medicare Other | Admitting: Cardiovascular Disease

## 2023-08-08 ENCOUNTER — Encounter: Payer: Medicare Other | Admitting: Student in an Organized Health Care Education/Training Program

## 2023-08-13 ENCOUNTER — Encounter: Payer: Medicare Other | Admitting: Student in an Organized Health Care Education/Training Program

## 2023-08-21 ENCOUNTER — Encounter: Payer: Self-pay | Admitting: Student in an Organized Health Care Education/Training Program

## 2023-08-21 ENCOUNTER — Ambulatory Visit
Payer: Medicare Other | Attending: Student in an Organized Health Care Education/Training Program | Admitting: Student in an Organized Health Care Education/Training Program

## 2023-08-21 VITALS — BP 109/90 | HR 72 | Temp 97.5°F | Resp 16 | Ht 59.0 in | Wt 110.0 lb

## 2023-08-21 DIAGNOSIS — M1711 Unilateral primary osteoarthritis, right knee: Secondary | ICD-10-CM

## 2023-08-21 DIAGNOSIS — S46001S Unspecified injury of muscle(s) and tendon(s) of the rotator cuff of right shoulder, sequela: Secondary | ICD-10-CM | POA: Diagnosis present

## 2023-08-21 DIAGNOSIS — G894 Chronic pain syndrome: Secondary | ICD-10-CM

## 2023-08-21 DIAGNOSIS — M170 Bilateral primary osteoarthritis of knee: Secondary | ICD-10-CM | POA: Diagnosis not present

## 2023-08-21 DIAGNOSIS — M19111 Post-traumatic osteoarthritis, right shoulder: Secondary | ICD-10-CM

## 2023-08-21 DIAGNOSIS — M1712 Unilateral primary osteoarthritis, left knee: Secondary | ICD-10-CM

## 2023-08-21 MED ORDER — TRAMADOL HCL 50 MG PO TABS: 50.0000 mg | ORAL_TABLET | Freq: Two times a day (BID) | ORAL | 3 refills | Status: DC

## 2023-08-21 MED ORDER — TRAMADOL HCL ER 200 MG PO TB24: 200.0000 mg | ORAL_TABLET | Freq: Every day | ORAL | 3 refills | Status: DC

## 2023-08-21 NOTE — Patient Instructions (Signed)

## 2023-08-21 NOTE — Progress Notes (Signed)
Nursing Pain Medication Assessment:  Safety precautions to be maintained throughout the outpatient stay will include: orient to surroundings, keep bed in low position, maintain call bell within reach at all times, provide assistance with transfer out of bed and ambulation.  Medication Inspection Compliance: Sue Knapp did not comply with our request to bring her pills to be counted. She was reminded that bringing the medication bottles, even when empty, is a requirement.  Medication: None brought in. Pill/Patch Count: None available to be counted. Bottle Appearance: No container available. Did not bring bottle(s) to appointment. Filled Date: N/A Last Medication intake:   unsure Safety precautions to be maintained throughout the outpatient stay will include: orient to surroundings, keep bed in low position, maintain call bell within reach at all times, provide assistance with transfer out of bed and ambulation.   Patient lives at an assisted living facility, her medications are administered by staff

## 2023-08-21 NOTE — Progress Notes (Signed)
PROVIDER NOTE: Information contained herein reflects review and annotations entered in association with encounter. Interpretation of such information and data should be left to medically-trained personnel. Information provided to patient can be located elsewhere in the medical record under "Patient Instructions". Document created using STT-dictation technology, any transcriptional errors that may result from process are unintentional.    Patient: Sue Knapp  Service Category: E/M  Provider: Edward Jolly, MD  DOB: 1922-11-28  DOS: 08/21/2023  Referring Provider: Lauro Regulus, MD  MRN: [id number]  Specialty: Interventional Pain Management  PCP: Lauro Regulus, MD  Type: Established Patient  Setting: Ambulatory outpatient    Location: Office  Delivery: Face-to-face     HPI  Sue Knapp, a 89 y.o. year old female, is here today because of her Chronic pain syndrome [G89.4]. Sue Knapp primary complain today is Shoulder Pain (bilateral), Back Pain (Left, lower), and Knee Pain (bilateral)  Pain Assessment: Severity of Chronic pain is reported as a 7 /10. Location: Shoulder Right, Left/ . Onset: More than a month ago. Quality: Constant. Timing: Intermittent. Modifying factor(s): rest. Vitals:  height is 4\' 11"  (1.499 m) and weight is 110 lb (49.9 kg). Her temporal temperature is 97.5 F (36.4 C) (abnormal). Her blood pressure is 109/90 (abnormal) and her pulse is 72. Her respiration is 16 and oxygen saturation is 96%.  BMI: Estimated body mass index is 22.22 kg/m as calculated from the following:   Height as of this encounter: 4\' 11"  (1.499 m).   Weight as of this encounter: 110 lb (49.9 kg). Last encounter: 05/13/2023. Last procedure: Visit date not found.  Reason for encounter: medication management.   No change in medical history since last visit.  Patient's pain is at baseline.  Patient continues multimodal pain regimen as prescribed.  States that it provides pain  relief and improvement in functional status.   Pharmacotherapy Assessment  Analgesic: Tramadol 200 mg ER 830pm, Tramadol 50 mg IR at 10 am and 6 pm  Monitoring: [place] PMP: PDMP not reviewed this encounter.       Pharmacotherapy: No side-effects or adverse reactions reported. Compliance: No problems identified. Effectiveness: Clinically acceptable.  Concepcion Elk, RN  08/21/2023  1:33 PM  Sign when Signing Visit Nursing Pain Medication Assessment:  Safety precautions to be maintained throughout the outpatient stay will include: orient to surroundings, keep bed in low position, maintain call bell within reach at all times, provide assistance with transfer out of bed and ambulation.  Medication Inspection Compliance: Sue Knapp did not comply with our request to bring her pills to be counted. She was reminded that bringing the medication bottles, even when empty, is a requirement.  Medication: None brought in. Pill/Patch Count: None available to be counted. Bottle Appearance: No container available. Did not bring bottle(s) to appointment. Filled Date: N/A Last Medication intake:   unsure Safety precautions to be maintained throughout the outpatient stay will include: orient to surroundings, keep bed in low position, maintain call bell within reach at all times, provide assistance with transfer out of bed and ambulation.   Patient lives at an assisted living facility, her medications are administered by staff  No results found for: "CBDTHCR" No results found for: "D8THCCBX" No results found for: "D9THCCBX"  UDS:  No results found for: "SUMMARY"    ROS  Constitutional: Denies any fever or chills Gastrointestinal: No reported hemesis, hematochezia, vomiting, or acute GI distress Musculoskeletal:  Bilateral knee and shoulder pain Neurological: No reported episodes of  acute onset apraxia, aphasia, dysarthria, agnosia, amnesia, paralysis, loss of coordination, or loss of  consciousness  Medication Review  ALPRAZolam, Dentifrices, acetaminophen, apixaban, diclofenac Sodium, docusate sodium, losartan, metoprolol succinate, nystatin, omeprazole, psyllium, torsemide, traMADol, trolamine salicylate, and zinc oxide  History Review  Allergy: Sue Knapp is allergic to codeine. Drug: Sue Knapp  reports no history of drug use. Alcohol:  reports no history of alcohol use. Tobacco:  reports that she has never smoked. She has never used smokeless tobacco. Social: Sue Knapp  reports that she has never smoked. She has never used smokeless tobacco. She reports that she does not drink alcohol and does not use drugs. Medical:  has a past medical history of Arthritis, Breast cancer (HCC) (2013), Cataract, Fatigue, GERD (gastroesophageal reflux disease), Glaucoma, Heart murmur, Heart palpitations, Hiatal hernia, HOH (hard of hearing), Hyperlipidemia, Hypertension, Hypothyroidism, Left ankle injury, Left bundle branch block, Neuropathy, Osteoporosis, Pacemaker, Palpitations, Personal history of radiation therapy (2013), Scoliosis, Shortness of breath dyspnea, Thyroid nodule, and TIA (transient ischemic attack). Surgical: Sue Knapp  has a past surgical history that includes Thyroidectomy, partial; Abdominal hysterectomy; Breast surgery; Cataract extraction w/PHACO (Right, 03/01/2015); Cataract extraction w/PHACO (Left, 03/22/2015); Mastectomy partial / lumpectomy; Breast lumpectomy (Left, 02/21/2012); Breast biopsy (Left, 2005); Breast biopsy (Left, 2013); Eye surgery (09/2020 and 10/2020); Esophagogastroduodenoscopy (egd) with propofol (N/A, 12/04/2020); and Esophagogastroduodenoscopy (egd) with propofol (N/A, 12/04/2020). Family: family history is not on file.  Laboratory Chemistry Profile   Renal Lab Results  Component Value Date   BUN 60 (H) 09/21/2021   CREATININE 1.50 (H) 09/21/2021   BCR 28 (H) 07/11/2021   GFRAA 37 (L) 11/28/2020   GFRNONAA 32 (L) 09/21/2021    Hepatic Lab  Results  Component Value Date   AST 26 07/11/2021   ALT 20 07/11/2021   ALBUMIN 3.2 (L) 03/12/2021   ALKPHOS 77 03/12/2021   LIPASE 36 09/14/2019    Electrolytes Lab Results  Component Value Date   NA 141 09/21/2021   K 4.4 09/21/2021   CL 101 09/21/2021   CALCIUM 9.4 09/21/2021   MG 1.9 12/04/2020    Bone No results found for: "VD25OH", "VD125OH2TOT", "EA5409WJ1", "BJ4782NF6", "25OHVITD1", "25OHVITD2", "25OHVITD3", "TESTOFREE", "TESTOSTERONE"  Inflammation (CRP: Acute Phase) (ESR: Chronic Phase) No results found for: "CRP", "ESRSEDRATE", "LATICACIDVEN"       Note: Above Lab results reviewed.  Recent Imaging Review  MM 3D SCREEN BREAST BILATERAL CLINICAL DATA:  Screening.  EXAM: DIGITAL SCREENING BILATERAL MAMMOGRAM WITH TOMOSYNTHESIS AND CAD  TECHNIQUE: Bilateral screening digital craniocaudal and mediolateral oblique mammograms were obtained. Bilateral screening digital breast tomosynthesis was performed. The images were evaluated with computer-aided detection.  COMPARISON:  Previous exam(s).  ACR Breast Density Category b: There are scattered areas of fibroglandular density.  FINDINGS: There are no findings suspicious for malignancy.  IMPRESSION: No mammographic evidence of malignancy. A result letter of this screening mammogram will be mailed directly to the patient.  RECOMMENDATION: Screening mammogram in one year. (Code:SM-B-01Y)  BI-RADS CATEGORY  1: Negative.  Electronically Signed   By: Sherian Rein M.D.   On: 10/18/2022 11:03 Note: Reviewed        Physical Exam  General appearance: Well nourished, well developed, and well hydrated. In no apparent acute distress Mental status: Alert, oriented x 3 (person, place, & time)       Respiratory: No evidence of acute respiratory distress Eyes: PERLA Vitals: BP (!) 109/90   Pulse 72   Temp (!) 97.5 F (36.4 C) (Temporal)   Resp 16  Ht 4\' 11"  (1.499 m)   Wt 110 lb (49.9 kg)   SpO2 96%   BMI  22.22 kg/m  BMI: Estimated body mass index is 22.22 kg/m as calculated from the following:   Height as of this encounter: 4\' 11"  (1.499 m).   Weight as of this encounter: 110 lb (49.9 kg). Ideal: Patient must be at least 60 in tall to calculate ideal body weight  Bilateral shoulder pain  Patient presents today in wheelchair Bilateral knee pain Limited walking ability  Assessment   Diagnosis  1. Chronic pain syndrome   2. Primary osteoarthritis of left knee   3. Primary osteoarthritis of right knee   4. 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  Pharmacotherapy (Medications Ordered): Meds ordered this encounter  Medications   traMADol (ULTRAM) 50 MG tablet    Sig: Take 1 tablet (50 mg total) by mouth 2 (two) times daily. 50 mg at 10 am, 50 mg at 6pm    Dispense:  60 tablet    Refill:  3   traMADol (ULTRAM-ER) 200 MG 24 hr tablet    Sig: Take 1 tablet (200 mg total) by mouth daily. At 9 pm, after dinner    Dispense:  30 tablet    Refill:  3   Orders:  No orders of the defined types were placed in this encounter.  Follow-up plan:   Return in about 4 months (around 12/19/2023) for MM, VV, add to NP list.      Status post right knee genicular nerve block 1 on 03/09/2019.  S/p right shoulder steroid injection 03/23/19. 05/04/2019:  R Genicular RFA- not effective.            Recent Visits No visits were found meeting these conditions. Showing recent visits within past 90 days and meeting all other requirements Today's Visits Date Type Provider Dept  08/21/23 Office Visit Edward Jolly, MD [facility]  Showing today's visits and meeting all other requirements Future Appointments No visits were found meeting these conditions. Showing future appointments within  next 90 days and meeting all other requirements  I discussed the assessment and treatment plan with the patient. The patient was provided an opportunity to ask questions and all were answered. The patient agreed with the plan and demonstrated an understanding of the instructions.  Patient advised to call back or seek an in-person evaluation if the symptoms or condition worsens.  Duration of encounter: .  Total time on encounter, as per AMA guidelines included both the face-to-face and non-face-to-face time personally spent by the physician and/or other qualified health care professional(s) on the day of the encounter (includes time in activities that require the physician or other qualified health care professional and does not include time in activities normally performed by clinical staff). Physician's time may include the following activities when performed: Preparing to see the patient (e.g., pre-charting review of records, searching for previously ordered imaging, lab work, and nerve conduction tests) Review of prior analgesic pharmacotherapies. Reviewing PMP Interpreting ordered tests (e.g., lab work, imaging, nerve conduction tests) Performing post-procedure evaluations, including interpretation of diagnostic procedures Obtaining and/or reviewing separately obtained history Performing a medically appropriate examination and/or evaluation Counseling and educating the patient/family/caregiver Ordering medications, tests, or procedures Referring and communicating with other health care professionals (when not  separately reported) Documenting clinical information in the electronic or other health record Independently interpreting results (not separately reported) and communicating results to the patient/ family/caregiver Care coordination (not separately reported)  Note by: Edward Jolly, MD Date: 08/21/2023; Time: 2:00 PM

## 2023-08-27 ENCOUNTER — Ambulatory Visit: Payer: Medicare Other | Admitting: Cardiovascular Disease

## 2023-09-12 ENCOUNTER — Ambulatory Visit: Payer: Medicare Other | Admitting: Nurse Practitioner

## 2023-09-24 ENCOUNTER — Ambulatory Visit: Payer: Medicare Other | Admitting: Nurse Practitioner

## 2023-10-22 NOTE — Progress Notes (Unsigned)
 Electrophysiology Office Note:    Date:  10/23/2023   ID:  Sue Knapp, DOB September 01, 1922, MRN [id number]  [facility] Cardiologist:  None  [facility] Electrophysiologist:  Lanier Prude, MD   Referring MD: Corky Downs, MD   Chief Complaint: Pacemaker establish care  History of Present Illness:    Sue Knapp is a 89 year old woman who presents to establish care for permanent pacemaker.  Her medical history includes TIA, breast cancer, left bundle branch block, hypothyroidism, hypertension, hyperlipidemia.  She also has a history of generalized anxiety disorder, chronic pain, lower extremity wounds, atrial fibrillation on Eliquis.    There is a pacemaker check from August 2023 showing that she was dependent on ventricular pacing.  She was programmed to a lower rate of 60 bpm.  There were 22 months expected battery longevity at that time.  Her lead parameters were stable.  The device was implanted May 25, 2013 and was previously followed by Dr. Harl Bowie.  She is with her daughter today in clinic. The patient reports several small open wounds on her feet secondary to a therapy shoe rubbing the skin. There is another area where she had weeping of fluid. No fevers/chills. Daughter asks about doing the generator change prior to ERI.    Their past medical, social and family history was reviewed.   ROS:   Please see the history of present illness.    All other systems reviewed and are negative.  EKGs/Labs/Other Studies Reviewed:    The following studies were reviewed today:  October 23, 2023 in clinic device interrogation personally reviewed Battery longevity 6 months Lead parameters stable Adapta device Vp 97.9%    Dec 01, 2020 2 view chest x-ray shows dual-chamber permanent pacemaker with passive fixation wires.  Leads in expected locations.       Physical Exam:    VS:  BP 112/62 (BP Location: Right Arm, Patient Position: Sitting, Cuff Size: Normal)   Pulse  85   Ht 4\' 11"  (1.499 m)   Wt 110 lb (49.9 kg)   SpO2 96%   BMI 22.22 kg/m     Wt Readings from Last 3 Encounters:  10/23/23 110 lb (49.9 kg)  08/21/23 110 lb (49.9 kg)  12/07/21 116 lb (52.6 kg)     GEN: no distress. Elderly. CARD: RRR, No MRG RESP: No IWOB. CTAB.        ASSESSMENT AND PLAN:    1. Cardiac pacemaker in situ   2. Atrial fibrillation, unspecified type (HCC)     #Permanent pacemaker in situ The patient presents to establish care for her permanent pacemaker that was implanted in 2014.  He was previously followed by Dr. Harl Bowie.  Leads are passive fixation.  Device is left-sided.  Leads are in appropriate position on chest x-ray from 2022.  Will establish for remote monitoring in our clinic. I did review the generator replacement procedure today. When she reaches ERI, OK to schedule.  Will need to hold [facility] for 48 hours prior to procedure to minimize bleeding risks.  Risks, benefits, and alternatives to pulse generator replacement were discussed in detail today.  The patient understands that risks include but are not limited to bleeding, infection, pneumothorax, perforation, tamponade, vascular damage, renal failure, MI, stroke, death, inappropriate shocks, damage to his existing leads, and lead dislodgement and wishes to proceed when device is at [facility].    #Atrial fibrillation Appears to be permanent.  On Eliquis for stroke prophylaxis.      Signed, Sheria Lang  Chalmers Cater, MD, [facility], [facility] 10/23/2023 12:10 PM    Electrophysiology [facility]

## 2023-10-23 ENCOUNTER — Encounter: Payer: Self-pay | Admitting: Cardiology

## 2023-10-23 ENCOUNTER — Ambulatory Visit: Payer: Medicare Other | Attending: Cardiology | Admitting: Cardiology

## 2023-10-23 VITALS — BP 112/62 | HR 85 | Ht 59.0 in | Wt 110.0 lb

## 2023-10-23 DIAGNOSIS — Z950 Presence of cardiac pacemaker: Secondary | ICD-10-CM | POA: Diagnosis not present

## 2023-10-23 DIAGNOSIS — I4891 Unspecified atrial fibrillation: Secondary | ICD-10-CM | POA: Diagnosis not present

## 2023-10-23 NOTE — Patient Instructions (Signed)
 Medication Instructions:  Your physician recommends that you continue on your current medications as directed. Please refer to the Current Medication list given to you today.  *If you need a refill on your cardiac medications before your next appointment, please call your pharmacy*  Follow-Up: At [facility], you and your health needs are our priority.  As part of our continuing mission to provide you with exceptional heart care, our providers are all part of one team.  This team includes your primary Cardiologist (physician) and Advanced Practice Providers or APPs (Physician Assistants and Nurse Practitioners) who all work together to provide you with the care you need, when you need it.  Your next appointment:   1 year  Provider:   You may see Lanier Prude, MD or one of the following Advanced Practice Providers on your designated Care Team:   Francis Dowse, [place] [address]" [place], [place] Sherie Don, NP Canary Brim, NP

## 2023-10-29 ENCOUNTER — Telehealth: Payer: Self-pay

## 2023-10-29 LAB — CUP PACEART INCLINIC DEVICE CHECK
Battery Impedance: 6995 Ohm
Battery Remaining Longevity: 6 mo
Battery Voltage: 2.62 V
Brady Statistic RV Percent Paced: 98 %
Date Time Interrogation Session: 20250402115400
Implantable Pulse Generator Implant Date: 20141103
Lead Channel Impedance Value: 67 Ohm
Lead Channel Impedance Value: 683 Ohm
Lead Channel Pacing Threshold Amplitude: 0.5 V
Lead Channel Pacing Threshold Amplitude: 0.625 V
Lead Channel Pacing Threshold Pulse Width: 0.4 ms
Lead Channel Pacing Threshold Pulse Width: 0.4 ms
Lead Channel Setting Pacing Amplitude: 2 V
Lead Channel Setting Pacing Pulse Width: 0.4 ms
Lead Channel Setting Sensing Sensitivity: 2 mV
Zone Setting Status: 755011
Zone Setting Status: 755011

## 2023-10-29 NOTE — Telephone Encounter (Signed)
 I ordered the pt a monitor. She should receive it in 7-10 business days.

## 2023-11-06 ENCOUNTER — Ambulatory Visit
Admission: RE | Admit: 2023-11-06 | Discharge: 2023-11-06 | Disposition: A | Discharge status: Home / Self Care | Payer: Medicare Other | Source: Ambulatory Visit | Attending: Internal Medicine | Admitting: Internal Medicine

## 2023-11-06 DIAGNOSIS — Z1231 Encounter for screening mammogram for malignant neoplasm of breast: Secondary | ICD-10-CM | POA: Insufficient documentation

## 2023-11-11 NOTE — Progress Notes (Unsigned)
 Cardiology Office Note  Date:  11/12/2023   ID:  Sue, Knapp 05-13-1923, MRN [id number]  PCP:  Jimmy Moulding, MD   Chief Complaint  Patient presents with   New Patient (Initial Visit)    Establish care for Atrial fibrillation, CHF, shortness of breath, leg swelling.     HPI:  [name] is a 89 year old woman with past medical history of Sick sinus syndrome/bradycardia, pacemaker Hematoma post pacer complication CHF Hypertension Chronic pain syndrome Shortness of breath Leg swelling Atrial fibrillation on Eliquis  Followed by EP for her pacemaker Who presents for new patient evaluation of her atrial fibrillation, CHF, shortness of breath, leg swelling  [name] is hard of hearing, much of the history provided by family who presents with her today  She reports long history of atrial fibrillation Seen by Dr. Marven Slimmer, EP, felt to have permanent atrial fibrillation In general reports she is asymptomatic On torsemide 40 mg daily  Activity limited by chronic knee and shoulder pain, on tramadol , presents today in a wheelchair Uses a rollator at home  Denies chest pain, no shortness of breath Wears compression hose bilaterally  Echocardiogram May 2022 EF 55 to 60% left atrium moderately dilated Moderate MR  EKG personally reviewed by myself on todays visit EKG Interpretation Date/Time:  Tuesday November 12 2023 11:09:53 EDT Ventricular Rate:  62 PR Interval:    QRS Duration:  140 QT Interval:  464 QTC Calculation: 470 R Axis:   -76  Text Interpretation: Ventricular-paced rhythm When compared with ECG of 21-Sep-2021 23:55, Vent. rate has increased BY   2 BPM Confirmed by Lynise Porr [phone]) on 11/12/2023 11:39:26 AM    PMH:   has a past medical history of Arthritis, Breast cancer (HCC) (2013), Cataract, Fatigue, GERD (gastroesophageal reflux disease), Glaucoma, Heart murmur, Heart palpitations, Hiatal hernia, HOH (hard of hearing),  Hyperlipidemia, Hypertension, Hypothyroidism, Left ankle injury, Left bundle branch block, Neuropathy, Osteoporosis, Pacemaker, Palpitations, Personal history of radiation therapy (2013), Scoliosis, Shortness of breath dyspnea, Thyroid nodule, and TIA (transient ischemic attack).  PSH:    Past Surgical History:  Procedure Laterality Date   ABDOMINAL HYSTERECTOMY     BREAST BIOPSY Left 2005   bengin   BREAST BIOPSY Left 2013   stereo bx, invasive tubular carcinoma and DCIS   BREAST LUMPECTOMY Left 02/21/2012   invasive tubular carcinoma and DCIS, clear margins, negative LN   BREAST SURGERY     LUMPECTOMY   CATARACT EXTRACTION W/PHACO Right 03/01/2015   Procedure: CATARACT EXTRACTION PHACO AND INTRAOCULAR LENS PLACEMENT (IOC);  Surgeon: Clair Crews, MD;  Location: [facility];  Service: Ophthalmology;  Laterality: Right;  US :1:03.1 AP:22.8 CDE:14.37  Fluid lot # [id number]   CATARACT EXTRACTION W/PHACO Left 03/22/2015   Procedure: CATARACT EXTRACTION PHACO AND INTRAOCULAR LENS PLACEMENT (IOC);  Surgeon: Clair Crews, MD;  Location: [facility];  Service: Ophthalmology;  Laterality: Left;  US : 01:01.0 AP%: 22.5% CDE: 13.75 Lot # [id number]   ESOPHAGOGASTRODUODENOSCOPY (EGD) WITH PROPOFOL  N/A 12/04/2020   Procedure: ESOPHAGOGASTRODUODENOSCOPY (EGD) WITH PROPOFOL ;  Surgeon: Selena Daily, MD;  Location: [facility];  Service: Gastroenterology;  Laterality: N/A;   ESOPHAGOGASTRODUODENOSCOPY (EGD) WITH PROPOFOL  N/A 12/04/2020   Procedure: ESOPHAGOGASTRODUODENOSCOPY (EGD) WITH PROPOFOL ;  Surgeon: Selena Daily, MD;  Location: [facility];  Service: Gastroenterology;  Laterality: N/A;   EYE SURGERY  09/2020 and 10/2020   MASTECTOMY PARTIAL / LUMPECTOMY     THYROIDECTOMY, PARTIAL      Current Outpatient Medications  Medication Sig Dispense Refill  acetaminophen  (TYLENOL ) 650 MG CR tablet Take 2 tablets (1,300 mg total) by mouth 2 (two) times daily as needed for pain.      ALPRAZolam (XANAX) 0.25 MG tablet Take 0.25 mg by mouth daily as needed.     Dentifrices (BIOTENE DRY MOUTH DT) Place onto teeth.     diclofenac Sodium (VOLTAREN) 1 % GEL Apply topically.     docusate sodium  (COLACE) 100 MG capsule Take 1 capsule (100 mg total) by mouth 2 (two) times daily. 10 capsule 0   ELIQUIS  2.5 MG TABS tablet TAKE 1 TABLET BY MOUTH TWICE DAILY 60 tablet 6   losartan  (COZAAR ) 50 MG tablet Take 50 mg by mouth daily.     metoprolol  succinate (TOPROL -XL) 50 MG 24 hr tablet TAKE 1 TABLET BY MOUTH DAILY 30 tablet 6   omeprazole  (PRILOSEC  OTC) 20 MG tablet Take 1 tablet (20 mg total) by mouth daily. 30 tablet 0   torsemide (DEMADEX) 20 MG tablet Take 40 mg by mouth daily.     traMADol  (ULTRAM ) 50 MG tablet Take 1 tablet (50 mg total) by mouth 2 (two) times daily. 50 mg at 10 am, 50 mg at 6pm 60 tablet 3   traMADol  (ULTRAM -ER) 200 MG 24 hr tablet Take 1 tablet (200 mg total) by mouth daily. At 9 pm, after dinner 30 tablet 3   trolamine salicylate (ASPERCREME/ALOE) 10 % cream Apply 1 application topically as needed for muscle pain.     zinc oxide (BALMEX) 11.3 % CREA cream Apply 1 Application topically 2 (two) times daily as needed.     NYAMYC powder Apply topically. (Patient not taking: Reported on 11/12/2023)     psyllium (METAMUCIL) 58.6 % packet Take 1 packet by mouth daily. (Patient not taking: Reported on 11/12/2023) 30 each 2   No current facility-administered medications for this visit.     Allergies:   Codeine   Social History:  The patient  reports that she has never smoked. She has never used smokeless tobacco. She reports that she does not drink alcohol and does not use drugs.   Family History:   family history is not on file.    Review of Systems: Review of Systems  Constitutional: Negative.   HENT: Negative.    Respiratory: Negative.    Cardiovascular:  Positive for leg swelling.  Gastrointestinal: Negative.   Musculoskeletal: Negative.        Gait  instability  Neurological: Negative.   Psychiatric/Behavioral: Negative.    All other systems reviewed and are negative.    PHYSICAL EXAM: VS:  BP (!) 130/50 (BP Location: Right Arm, Patient Position: Sitting, Cuff Size: Normal)   Pulse 62   Ht 4\' 11"  (1.499 m)   Wt 110 lb (49.9 kg)   SpO2 97%   BMI 22.22 kg/m  , BMI Body mass index is 22.22 kg/m. GEN: Well nourished, well developed, in no acute distress HEENT: normal Neck: no JVD, carotid bruits, or masses Cardiac: Irregularly irregular; no murmurs, rubs, or gallops,no edema  Respiratory:  clear to auscultation bilaterally, normal work of breathing GI: soft, nontender, nondistended, + BS MS: no deformity or atrophy Skin: warm and dry, no rash Neuro:  Strength and sensation are intact Psych: euthymic mood, full affect  Recent Labs: No results found for requested labs within last 365 days.   Lipid Panel Lab Results  Component Value Date   CHOL 183 07/11/2021   HDL 46 (L) 07/11/2021   LDLCALC 118 (H) 07/11/2021   TRIG  86 07/11/2021      Wt Readings from Last 3 Encounters:  11/12/23 110 lb (49.9 kg)  10/23/23 110 lb (49.9 kg)  08/21/23 110 lb (49.9 kg)     ASSESSMENT AND PLAN:  Problem List Items Addressed This Visit       Cardiology Problems   Sick sinus syndrome due to SA node dysfunction (HCC)   CHF (congestive heart failure) (HCC)   Essential hypertension     Other   Leg swelling   Other Visit Diagnoses       Atrial fibrillation, unspecified type (HCC)    -  Primary   Relevant Orders   EKG 12-Lead (Completed)     Cardiac pacemaker in situ          Permanent atrial fibrillation On Eliquis  2.5 twice daily, reduced dose for age, weight Rate controlled on metoprolol  succinate 50 daily, rates in the 60s - Likely component of chronic diastolic CHF, on torsemide 40 daily - We will check BMP and CBC today  Leg edema - Chronic lower extremity edema, stable, wears compression hose Continue torsemide  40 daily with lab work checked today  Essential hypertension Blood pressure is well controlled on today's visit. No changes made to the medications.  Continue losartan , metoprolol   Gait instability Uses rollator, no recent falls  Pacemaker Sick sinus syndrome, initially placed for symptomatic bradycardia.  Followed by EP, device would likely require battery change in the next 6 months  Signed, Juanda Noon, M.D., Ph.D. [facility] [place], [place] [phone]

## 2023-11-12 ENCOUNTER — Ambulatory Visit: Payer: Medicare Other | Attending: Cardiovascular Disease | Admitting: Cardiovascular Disease

## 2023-11-12 VITALS — BP 130/50 | HR 62 | Ht 59.0 in | Wt 110.0 lb

## 2023-11-12 DIAGNOSIS — Z950 Presence of cardiac pacemaker: Secondary | ICD-10-CM

## 2023-11-12 DIAGNOSIS — I5033 Acute on chronic diastolic (congestive) heart failure: Secondary | ICD-10-CM | POA: Diagnosis not present

## 2023-11-12 DIAGNOSIS — I10 Essential (primary) hypertension: Secondary | ICD-10-CM

## 2023-11-12 DIAGNOSIS — Z79899 Other long term (current) drug therapy: Secondary | ICD-10-CM

## 2023-11-12 DIAGNOSIS — I495 Sick sinus syndrome: Secondary | ICD-10-CM

## 2023-11-12 DIAGNOSIS — M7989 Other specified soft tissue disorders: Secondary | ICD-10-CM

## 2023-11-12 DIAGNOSIS — I4891 Unspecified atrial fibrillation: Secondary | ICD-10-CM

## 2023-11-12 NOTE — Patient Instructions (Addendum)
 Medication Instructions: Your physician recommends that you continue on your current medications as directed. Please refer to the Current Medication list given to you today   If you need a refill on your cardiac medications before your next appointment, please call your pharmacy.   Lab work  CBC and CMP  Testing/Procedures: No new testing needed  Follow-Up: At [facility], you and your health needs are our priority.  As part of our continuing mission to provide you with exceptional heart care, we have created designated Provider Care Teams.  These Care Teams include your primary Cardiologist (physician) and Advanced Practice Providers (APPs -  Physician Assistants and Nurse Practitioners) who all work together to provide you with the care you need, when you need it.  You will need a follow up appointment in 12 months  Providers on your designated Care Team:   Laneta Pintos, NP Varney Gentleman, PA-C Cadence Gennaro Khat, [place]  COVID-19 Vaccine Information can be found at: [email or web address] For questions related to vaccine distribution or appointments, please email vaccine@[email or web address] [email or web address] or call [phone].

## 2023-11-13 LAB — CBC
Hematocrit: 42.4 % (ref 34.0–46.6)
Hemoglobin: 13.3 g/dL (ref 11.1–15.9)
MCH: 29 pg (ref 26.6–33.0)
MCHC: 31.4 g/dL — ABNORMAL LOW (ref 31.5–35.7)
MCV: 93 fL (ref 79–97)
Platelets: 275 10*3/uL (ref 150–450)
RBC: 4.58 x10E6/uL (ref 3.77–5.28)
RDW: 14 % (ref 11.7–15.4)
WBC: 9.4 10*3/uL (ref 3.4–10.8)

## 2023-11-13 LAB — COMPREHENSIVE METABOLIC PANEL WITH GFR
ALT: 30 IU/L (ref 0–32)
AST: 43 IU/L — ABNORMAL HIGH (ref 0–40)
Albumin: 3.8 g/dL (ref 3.6–4.6)
Alkaline Phosphatase: 201 IU/L — ABNORMAL HIGH (ref 44–121)
BUN/Creatinine Ratio: 34 — ABNORMAL HIGH (ref 12–28)
BUN: 47 mg/dL — ABNORMAL HIGH (ref 10–36)
Bilirubin Total: 0.9 mg/dL (ref 0.0–1.2)
CO2: 28 mmol/L (ref 20–29)
Calcium: 9.9 mg/dL (ref 8.7–10.3)
Chloride: 99 mmol/L (ref 96–106)
Creatinine, Ser: 1.39 mg/dL — ABNORMAL HIGH (ref 0.57–1.00)
Globulin, Total: 2.7 g/dL (ref 1.5–4.5)
Glucose: 92 mg/dL (ref 70–99)
Potassium: 4.6 mmol/L (ref 3.5–5.2)
Sodium: 143 mmol/L (ref 134–144)
Total Protein: 6.5 g/dL (ref 6.0–8.5)
eGFR: 34 mL/min/{1.73_m2} — ABNORMAL LOW (ref >59)

## 2023-11-18 ENCOUNTER — Telehealth: Payer: Self-pay | Admitting: Student in an Organized Health Care Education/Training Program

## 2023-11-18 ENCOUNTER — Encounter: Payer: Self-pay | Admitting: Emergency Medicine

## 2023-11-18 ENCOUNTER — Telehealth: Payer: Self-pay | Admitting: *Deleted

## 2023-11-18 ENCOUNTER — Telehealth: Payer: Self-pay

## 2023-11-18 ENCOUNTER — Other Ambulatory Visit: Payer: Self-pay | Admitting: *Deleted

## 2023-11-18 DIAGNOSIS — G894 Chronic pain syndrome: Secondary | ICD-10-CM

## 2023-11-18 MED ORDER — TRAMADOL HCL ER 200 MG PO TB24: 200.0000 mg | ORAL_TABLET | Freq: Every day | ORAL | 0 refills | Status: DC

## 2023-11-18 NOTE — Telephone Encounter (Signed)
 ERROR

## 2023-11-18 NOTE — Telephone Encounter (Signed)
 Spoke with daughter Sheryle Donning, the Rx in question is Tramadol  ER. Last prescribed 08-21-23, with 4 refills. I called [facility], they verified that it was last filled on 11-13-23. Patient has VV on 12-11-23. Attempted to call Tabitha at [facility] to inform her of this, left voicemail. Attempted to call Sheryle Donning to inform her of this, left voicemail.

## 2023-11-18 NOTE — Telephone Encounter (Signed)
 PT daughter called stated that her mother has been out of Tramadol  since Saturday. Daughter stated that the nurse from the facility has also call and send a msg thur Epic to [facility]. Daughter stated that she has been waiting on a phone call also. PT wants to know if meds Vernetta Gong be send today. TY

## 2023-11-18 NOTE — Telephone Encounter (Signed)
  Telephone 11/18/2023 [facility] at [facility]   Cephus Collin, MD Pain Medicine   All Conversations (Newest Message First)Me     11/18/23 11:03 AM Note Tabitha returned my call, she said the pharmacy had given me the incorrect information on Toradol ER. I called that pharmacy again, they told me they do not have the Rx for Tramadol  ER written on 08-21-23 with 3 refills. Will reprint and fax it to them.     Me     11/18/23 10:26 AM Note Spoke with daughter Sheryle Donning, the Rx in question is Tramadol  ER. Last prescribed 08-21-23, with 4 refills. I called [facility], they verified that it was last filled on 11-13-23. Patient has VV on 12-11-23. Attempted to call Tabitha at [facility] to inform her of this, left voicemail. Attempted to call Sheryle Donning to inform her of this, left voicemail.        11/18/23 10:05 AM Rafael Bun S routed this conversation to P[facility]   Delman Ferns  LF   11/18/23 10:05 AM Note PT daughter called stated that her mother has been out of Tramadol  since Saturday. Daughter stated that the nurse from the facility has also call and send a msg thur Epic to [employer] [facility]. Daughter stated that she has been waiting on a phone call also. PT wants to know if meds Vernetta Gong be send today. TY         11/18/23 10:02 AM Jenita Miyamoto contacted Rafael Bun S  Addition:  Pharmacy called, they cannot accept the faxed copy of the prescription for Tramadol  ER.  Basilio Both, NP will send a new Rx for 1 month for Tramadol  ER. Carol notified.

## 2023-11-18 NOTE — Telephone Encounter (Signed)
 Spoke to pts daughter to give pacemaker dates. Appreciate of call back.   Shirl Dose [phone] number to call if you need assistance for sending manual transmission.

## 2023-11-18 NOTE — Telephone Encounter (Signed)
 Tabitha returned my call, she said the pharmacy had given me the incorrect information on Toradol ER. I called that pharmacy again, they told me they do not have the Rx for Tramadol  ER written on 08-21-23 with 3 refills. Will reprint and fax it to them.

## 2023-11-21 ENCOUNTER — Encounter: Payer: Self-pay | Admitting: Nurse Practitioner

## 2023-11-21 ENCOUNTER — Ambulatory Visit (INDEPENDENT_AMBULATORY_CARE_PROVIDER_SITE_OTHER): Admitting: Nurse Practitioner

## 2023-11-21 VITALS — BP 99/60 | HR 65 | Temp 98.5°F | Resp 16 | Ht 59.0 in | Wt 106.9 lb

## 2023-11-21 DIAGNOSIS — Z950 Presence of cardiac pacemaker: Secondary | ICD-10-CM

## 2023-11-21 DIAGNOSIS — N390 Urinary tract infection, site not specified: Secondary | ICD-10-CM | POA: Diagnosis not present

## 2023-11-21 DIAGNOSIS — Z79899 Other long term (current) drug therapy: Secondary | ICD-10-CM

## 2023-11-21 DIAGNOSIS — K5909 Other constipation: Secondary | ICD-10-CM | POA: Diagnosis not present

## 2023-11-21 DIAGNOSIS — M170 Bilateral primary osteoarthritis of knee: Secondary | ICD-10-CM | POA: Diagnosis not present

## 2023-11-21 DIAGNOSIS — I10 Essential (primary) hypertension: Secondary | ICD-10-CM | POA: Diagnosis not present

## 2023-11-21 MED ORDER — AZO DUAL PROTECTION PO CAPS: 1.0000 | ORAL_CAPSULE | Freq: Every day | ORAL | 11 refills | Status: DC

## 2023-11-21 NOTE — Progress Notes (Signed)
 Sue Knapp [address] [place], [place] [address]  Internal MEDICINE  Office Visit Note  Patient Name: Sue Knapp  [id number]  [id number]  Date of Service: 11/21/2023   Complaints/HPI Pt is here for establishment of PCP. Chief Complaint  Patient presents with   New Patient (Initial Visit)    Est care    HPI [name] presents for a new patient visit to establish care.  Well-appearing 89 y.o. female with a pacemaker, GERD, osteoarthritis of multiple joints.  Work: retired  Home: lives at an assisted living center.  Diet: fair, could be better per patient.  Exercise: seated exercises, minimal walking with walker . Tobacco use: none  Alcohol use: rarely, none in past 5 years.  Illicit drug use: none  Routine mammogram: done recently .  Labs: recent labs via cardiology  New or worsening pain: chronic pain in shoulder, knees and hands esp.  Bruising to right shoulder due to bone on bone and leakage of fluid from shoulder joint. On blood thinners. Frequent UTI -- gets altered mental status/confusion.  Arthritis in shoulders, knees and hands.   Current Medication: Outpatient Encounter Medications as of 11/21/2023  Medication Sig   acetaminophen  (TYLENOL ) 650 MG CR tablet Take 2 tablets (1,300 mg total) by mouth 2 (two) times daily as needed for pain.   ALPRAZolam (XANAX) 0.25 MG tablet Take 0.25 mg by mouth daily as needed.   Dentifrices (BIOTENE DRY MOUTH DT) Place onto teeth.   diclofenac Sodium (VOLTAREN) 1 % GEL Apply topically.   docusate sodium  (COLACE) 100 MG capsule Take 1 capsule (100 mg total) by mouth 2 (two) times daily.   ELIQUIS  2.5 MG TABS tablet TAKE 1 TABLET BY MOUTH TWICE DAILY   hyoscyamine (LEVSIN) 0.125 MG tablet Take 0.125 mg by mouth every 8 (eight) hours as needed (bloating, GI upset).   Lactobacillus (AZO DUAL PROTECTION) CAPS Take 1 capsule by mouth daily.   losartan  (COZAAR ) 50 MG tablet Take 50 mg by mouth daily.   melatonin 5 MG  TABS Take 5 mg by mouth at bedtime as needed (insomnia).   metoprolol  succinate (TOPROL -XL) 50 MG 24 hr tablet TAKE 1 TABLET BY MOUTH DAILY   NYAMYC powder Apply topically.   ondansetron  (ZOFRAN -ODT) 4 MG disintegrating tablet Take 4 mg by mouth every 8 (eight) hours as needed for nausea or vomiting.   polyethylene glycol (MIRALAX / GLYCOLAX) 17 g packet Take 17 g by mouth at bedtime as needed for mild constipation, moderate constipation or severe constipation.   psyllium (METAMUCIL) 58.6 % packet Take 1 packet by mouth daily.   torsemide (DEMADEX) 20 MG tablet Take 40 mg by mouth daily.   trolamine salicylate (ASPERCREME/ALOE) 10 % cream Apply 1 application topically as needed for muscle pain.   zinc oxide (BALMEX) 11.3 % CREA cream Apply 1 Application topically 2 (two) times daily as needed.   [DISCONTINUED] traMADol  (ULTRAM ) 50 MG tablet Take 1 tablet (50 mg total) by mouth 2 (two) times daily. 50 mg at 10 am, 50 mg at 6pm   [DISCONTINUED] traMADol  (ULTRAM -ER) 200 MG 24 hr tablet Take 1 tablet (200 mg total) by mouth daily. At 9 pm, after dinner   omeprazole  (PRILOSEC  OTC) 20 MG tablet Take 1 tablet (20 mg total) by mouth daily.   No facility-administered encounter medications on file as of 11/21/2023.    Surgical History: Past Surgical History:  Procedure Laterality Date   ABDOMINAL HYSTERECTOMY     BREAST BIOPSY Left 2005  bengin   BREAST BIOPSY Left 2013   stereo bx, invasive tubular carcinoma and DCIS   BREAST LUMPECTOMY Left 02/21/2012   invasive tubular carcinoma and DCIS, clear margins, negative LN   BREAST SURGERY     LUMPECTOMY   CATARACT EXTRACTION W/PHACO Right 03/01/2015   Procedure: CATARACT EXTRACTION PHACO AND INTRAOCULAR LENS PLACEMENT (IOC);  Surgeon: Clair Crews, MD;  Location: [facility];  Service: Ophthalmology;  Laterality: Right;  US :1:03.1 AP:22.8 CDE:14.37  Fluid lot # [id number]   CATARACT EXTRACTION W/PHACO Left 03/22/2015   Procedure: CATARACT EXTRACTION  PHACO AND INTRAOCULAR LENS PLACEMENT (IOC);  Surgeon: Clair Crews, MD;  Location: [facility];  Service: Ophthalmology;  Laterality: Left;  US : 01:01.0 AP%: 22.5% CDE: 13.75 Lot # [id number]   ESOPHAGOGASTRODUODENOSCOPY (EGD) WITH PROPOFOL  N/A 12/04/2020   Procedure: ESOPHAGOGASTRODUODENOSCOPY (EGD) WITH PROPOFOL ;  Surgeon: Selena Daily, MD;  Location: [facility];  Service: Gastroenterology;  Laterality: N/A;   ESOPHAGOGASTRODUODENOSCOPY (EGD) WITH PROPOFOL  N/A 12/04/2020   Procedure: ESOPHAGOGASTRODUODENOSCOPY (EGD) WITH PROPOFOL ;  Surgeon: Selena Daily, MD;  Location: [facility];  Service: Gastroenterology;  Laterality: N/A;   EYE SURGERY  09/2020 and 10/2020   MASTECTOMY PARTIAL / LUMPECTOMY     THYROIDECTOMY, PARTIAL      Medical History: Past Medical History:  Diagnosis Date   Arthritis    Breast cancer (HCC) 2013   left breast, radiation and lumpectomy   Cataract    Fatigue    GERD (gastroesophageal reflux disease)    Glaucoma    Heart murmur    Heart palpitations    Hiatal hernia    HOH (hard of hearing)    AIDS   Hyperlipidemia    Hypertension    Hypothyroidism    Left ankle injury    Old left ankle injury   Left bundle branch block    Neuropathy    Osteoporosis    Pacemaker    Palpitations    Personal history of radiation therapy 2013   left breast ca   Scoliosis    Shortness of breath dyspnea    Thyroid nodule    TIA (transient ischemic attack)     Family History: Family History  Problem Relation Age of Onset   Breast cancer Neg Hx     Social History   Socioeconomic History   Marital status: Widowed    Spouse name: Not on file   Number of children: 3   Years of education: Not on file   Highest education level: High school graduate  Occupational History   Not on file  Tobacco Use   Smoking status: Never   Smokeless tobacco: Never  Vaping Use   Vaping status: Never Used  Substance and Sexual Activity   Alcohol use: No    Drug use: Never    Comment: pain clinic: tramadol    Sexual activity: Not Currently  Other Topics Concern   Not on file  Social History Narrative   Not on file   Social Drivers of Health   Financial Resource Strain: Low Risk  (07/11/2021)   Overall Financial Resource Strain (CARDIA)    Difficulty of Paying Living Expenses: Not hard at all  Food Insecurity: No Food Insecurity (07/11/2021)   Hunger Vital Sign    Worried About Running Out of Food in the Last Year: Never true    Ran Out of Food in the Last Year: Never true  Transportation Needs: No Transportation Needs (07/11/2021)   PRAPARE - Transportation    Lack of Transportation (  Medical): No    Lack of Transportation (Non-Medical): No  Physical Activity: Inactive (07/11/2021)   Exercise Vital Sign    Days of Exercise per Week: 0 days    Minutes of Exercise per Session: 0 min  Stress: Stress Concern Present (07/11/2021)   [employer] of Occupational Health - Occupational Stress Questionnaire    Feeling of Stress : To some extent  Social Connections: Moderately Integrated (07/11/2021)   Social Connection and Isolation Panel [NHANES]    Frequency of Communication with Friends and Family: More than three times a week    Frequency of Social Gatherings with Friends and Family: More than three times a week    Attends Religious Services: More than 4 times per year    Active Member of [employer] or Organizations: Yes    Attends Banker Meetings: More than 4 times per year    Marital Status: Widowed  Intimate Partner Violence: Not At Risk (07/11/2021)   Humiliation, Afraid, Rape, and Kick questionnaire    Fear of Current or Ex-Partner: No    Emotionally Abused: No    Physically Abused: No    Sexually Abused: No     Review of Systems  Constitutional:  Negative for chills, fatigue and unexpected weight change.  HENT:  Negative for congestion, postnasal drip, rhinorrhea, sneezing and sore throat.   Eyes:  Negative  for redness.  Respiratory: Negative.  Negative for cough, chest tightness, shortness of breath and wheezing.   Cardiovascular: Negative.  Negative for chest pain and palpitations.  Gastrointestinal:  Negative for abdominal pain, constipation, diarrhea, nausea and vomiting.  Genitourinary:  Negative for dysuria and frequency.  Musculoskeletal:  Positive for arthralgias. Negative for back pain, joint swelling and neck pain.  Skin:  Negative for rash.  Neurological: Negative.  Negative for tremors and numbness.  Hematological:  Negative for adenopathy. Bruises/bleeds easily.  Psychiatric/Behavioral:  Negative for behavioral problems (Depression), sleep disturbance and suicidal ideas. The patient is not nervous/anxious.     Vital Signs: BP 99/60   Pulse 65   Temp 98.5 F (36.9 C)   Resp 16   Ht 4\' 11"  (1.499 m)   Wt 106 lb 14.4 oz (48.5 kg)   SpO2 93%   BMI 21.59 kg/m    Physical Exam Vitals reviewed.  Constitutional:      General: She is not in acute distress.    Appearance: Normal appearance. She is normal weight. She is not ill-appearing.  HENT:     Head: Normocephalic and atraumatic.  Eyes:     Pupils: Pupils are equal, round, and reactive to light.  Cardiovascular:     Rate and Rhythm: Normal rate and regular rhythm.     Heart sounds: Normal heart sounds. No murmur heard. Pulmonary:     Effort: Pulmonary effort is normal. No respiratory distress.     Breath sounds: Normal breath sounds. No wheezing.  Skin:    Capillary Refill: Capillary refill takes less than 2 seconds.  Neurological:     Mental Status: She is alert and oriented to person, place, and time.  Psychiatric:        Mood and Affect: Mood normal.        Behavior: Behavior normal.       Assessment/Plan: 1. Essential hypertension (Primary) Stable, continue medication as prescribed   2. Bilateral primary osteoarthritis of knee Continue prn medication as prescribed.   3. Recurrent UTI OTC supplement  prescribed to help prevent UTIs - Lactobacillus (AZO DUAL PROTECTION)  CAPS; Take 1 capsule by mouth daily.  Dispense: 30 capsule; Refill: 11  4. Other constipation Use miralax as needed   5. Encounter for medication review Medication list reviewed, updated and refills ordered  - hyoscyamine (LEVSIN) 0.125 MG tablet; Take 0.125 mg by mouth every 8 (eight) hours as needed (bloating, GI upset). - melatonin 5 MG TABS; Take 5 mg by mouth at bedtime as needed (insomnia). - polyethylene glycol (MIRALAX / GLYCOLAX) 17 g packet; Take 17 g by mouth at bedtime as needed for mild constipation, moderate constipation or severe constipation. - ondansetron  (ZOFRAN -ODT) 4 MG disintegrating tablet; Take 4 mg by mouth every 8 (eight) hours as needed for nausea or vomiting.  6. Pacemaker Sees cardiology     General Counseling: amil moseman understanding of the findings of todays visit and agrees with plan of treatment. I have discussed any further diagnostic evaluation that may be needed or ordered today. We also reviewed her medications today. she has been encouraged to call the office with any questions or concerns that should arise related to todays visit.    No orders of the defined types were placed in this encounter.   Meds ordered this encounter  Medications   Lactobacillus (AZO DUAL PROTECTION) CAPS    Sig: Take 1 capsule by mouth daily.    Dispense:  30 capsule    Refill:  11    AZO dual protection urinary and vaginal support    Return for Burlene Carpen PCP in august or september and otherwise visits as needed. .  Time spent:30 Minutes Time spent with patient included reviewing progress notes, labs, imaging studies, and discussing plan for follow up.   [place] Controlled Substance Database was reviewed by me for overdose risk score (ORS)   This patient was seen by Laurence Pons, FNP-C in collaboration with Dr. Verneta Gone as a part of collaborative care agreement.   Aurthur Wingerter R.  Bobbi Burow, MSN, FNP-C Internal Medicine

## 2023-11-22 ENCOUNTER — Ambulatory Visit (INDEPENDENT_AMBULATORY_CARE_PROVIDER_SITE_OTHER)

## 2023-11-22 DIAGNOSIS — I495 Sick sinus syndrome: Secondary | ICD-10-CM

## 2023-11-22 LAB — CUP PACEART REMOTE DEVICE CHECK
Battery Impedance: 7756 Ohm
Battery Remaining Longevity: 4 mo
Battery Voltage: 2.59 V
Brady Statistic RV Percent Paced: 98 %
Date Time Interrogation Session: 20250502112304
Implantable Pulse Generator Implant Date: 20141103
Lead Channel Impedance Value: 662 Ohm
Lead Channel Impedance Value: 67 Ohm
Lead Channel Pacing Threshold Amplitude: 0.625 V
Lead Channel Pacing Threshold Pulse Width: 0.4 ms
Lead Channel Setting Pacing Amplitude: 2 V
Lead Channel Setting Pacing Pulse Width: 0.4 ms
Lead Channel Setting Sensing Sensitivity: 2 mV
Zone Setting Status: 755011
Zone Setting Status: 755011

## 2023-12-10 ENCOUNTER — Telehealth: Payer: Self-pay

## 2023-12-10 NOTE — Telephone Encounter (Signed)
 Call patient's nurse at [facility] and she states patient is not available at the moment. Informed nurse to have patient return call to pain clinic at [phone] to speak to any of the nurses here to go over pre-appointment questions prior to telephone visit with Dr. Rhesa Celeste appointment tomorrow. She states she will give message to patient and they will return phone call.

## 2023-12-11 ENCOUNTER — Ambulatory Visit
Attending: Student in an Organized Health Care Education/Training Program | Admitting: Student in an Organized Health Care Education/Training Program

## 2023-12-11 DIAGNOSIS — G894 Chronic pain syndrome: Secondary | ICD-10-CM

## 2023-12-11 NOTE — Progress Notes (Signed)
I attempted to call the patient however no response. Voicemail left instructing patient to call front desk office at [phone] to reschedule appointment. -Dr Yamileth Hayse  

## 2023-12-12 ENCOUNTER — Telehealth: Payer: Medicare Other | Admitting: Student in an Organized Health Care Education/Training Program

## 2023-12-12 ENCOUNTER — Ambulatory Visit: Attending: Student in an Organized Health Care Education/Training Program | Admitting: Nurse Practitioner

## 2023-12-12 DIAGNOSIS — S46001S Unspecified injury of muscle(s) and tendon(s) of the rotator cuff of right shoulder, sequela: Secondary | ICD-10-CM | POA: Diagnosis not present

## 2023-12-12 DIAGNOSIS — M19111 Post-traumatic osteoarthritis, right shoulder: Secondary | ICD-10-CM

## 2023-12-12 DIAGNOSIS — M170 Bilateral primary osteoarthritis of knee: Secondary | ICD-10-CM | POA: Diagnosis not present

## 2023-12-12 DIAGNOSIS — M19011 Primary osteoarthritis, right shoulder: Secondary | ICD-10-CM

## 2023-12-12 DIAGNOSIS — M1712 Unilateral primary osteoarthritis, left knee: Secondary | ICD-10-CM

## 2023-12-12 DIAGNOSIS — Z79899 Other long term (current) drug therapy: Secondary | ICD-10-CM

## 2023-12-12 DIAGNOSIS — G894 Chronic pain syndrome: Secondary | ICD-10-CM

## 2023-12-12 DIAGNOSIS — M1711 Unilateral primary osteoarthritis, right knee: Secondary | ICD-10-CM

## 2023-12-12 MED ORDER — TRAMADOL HCL ER 200 MG PO TB24: 200.0000 mg | ORAL_TABLET | Freq: Every day | ORAL | 0 refills | Status: DC

## 2023-12-12 MED ORDER — TRAMADOL HCL 50 MG PO TABS: 50.0000 mg | ORAL_TABLET | Freq: Two times a day (BID) | ORAL | 3 refills | Status: DC

## 2023-12-12 NOTE — Progress Notes (Signed)
 PROVIDER NOTE: Interpretation of information contained herein should be left to medically-trained personnel. Specific patient instructions are provided elsewhere under "Patient Instructions" section of medical record. This document was created in part using AI and STT-dictation technology, any transcriptional errors that may result from this process are unintentional.  Patient: Sue Knapp  Service: E/M   PCP: Laurence Pons, NP  DOB: 01/09/1923  DOS: 12/12/2023  Provider: Cherylin Corrigan, NP  MRN: [id number]  Delivery: Virtual Visit  Specialty: Interventional Pain Management  Type: Established Patient  Setting: Ambulatory outpatient facility  Specialty designation: 09  Referring Prov.: Laurence Pons, NP  Location: Remote location       Virtual Encounter - Pain Management PROVIDER NOTE: Information contained herein reflects review and annotations entered in association with encounter. Interpretation of such information and data should be left to medically-trained personnel. Information provided to patient can be located elsewhere in the medical record under "Patient Instructions". Document created using STT-dictation technology, any transcriptional errors that may result from process are unintentional.    Contact & Pharmacy Preferred: [phone] Home: [phone] (home) Mobile: [phone] (mobile) E-mail: No e-mail address on record  [facility] - [place], [place] - [place] [address] [place] [address] Phone: [phone] Fax: [phone]   Pre-screening  Sue Knapp offered "in-person" vs "virtual" encounter. She indicated preferring virtual for this encounter.   Reason COVID-19*  Social distancing based on CDC and AMA recommendations.   I contacted Sue Knapp on 12/12/2023 via telephone.      I clearly identified myself as Cherylin Corrigan, NP. I verified that I was speaking with the correct person using two identifiers  (Name: Sue Knapp, and date of birth: October 09, 1922).  Sue Knapp reports experiencing 2 falls, 1 in February 2025 and another in March 2025.  However she states that she did not sustain any bruises, bleeding or injuries from either incident.  She explained that during each fall, she attempt to move closer to the edge of the chair and transition into a seated position on the floor in a controlled manner.   Consent I sought verbal advanced consent from Sue Knapp for virtual visit interactions. I informed Sue Knapp of possible security and privacy concerns, risks, and limitations associated with providing "not-in-person" medical evaluation and management services. I also informed Sue Knapp of the availability of "in-person" appointments. Finally, I informed her that there would be a charge for the virtual visit and that she could be  personally, fully or partially, financially responsible for it. Sue Knapp expressed understanding and agreed to proceed.   Historic Elements   Sue Knapp is a 89 y.o. year old, female patient evaluated today after our last contact on Visit date not found. Sue Knapp  has a past medical history of Arthritis, Breast cancer (HCC) (2013), Cataract, Fatigue, GERD (gastroesophageal reflux disease), Glaucoma, Heart murmur, Heart palpitations, Hiatal hernia, HOH (hard of hearing), Hyperlipidemia, Hypertension, Hypothyroidism, Left ankle injury, Left bundle branch block, Neuropathy, Osteoporosis, Pacemaker, Palpitations, Personal history of radiation therapy (2013), Scoliosis, Shortness of breath dyspnea, Thyroid nodule, and TIA (transient ischemic attack). She also  has a past surgical history that includes Thyroidectomy, partial; Abdominal hysterectomy; Breast surgery; Cataract extraction w/PHACO (Right, 03/01/2015); Cataract extraction w/PHACO (Left, 03/22/2015); Mastectomy partial / lumpectomy; Breast lumpectomy (Left, 02/21/2012); Breast biopsy (Left, 2005);  Breast biopsy (Left, 2013); Eye surgery (09/2020 and 10/2020); Esophagogastroduodenoscopy (egd) with propofol  (N/A, 12/04/2020); and Esophagogastroduodenoscopy (egd) with propofol  (N/A, 12/04/2020).  Sue Knapp has a current medication list which includes the following prescription(s): tramadol , tramadol , acetaminophen , alprazolam, dentifrices, diclofenac sodium, docusate sodium , eliquis , hyoscyamine, azo dual protection, losartan , melatonin, metoprolol  succinate, nyamyc, omeprazole , ondansetron , polyethylene glycol, psyllium, torsemide, aspercreme/aloe, and zinc oxide. She  reports that she has never smoked. She has never used smokeless tobacco. She reports that she does not drink alcohol and does not use drugs. Sue Knapp is allergic to codeine.  BMI: Estimated body mass index is 21.59 kg/m as calculated from the following:   Height as of 11/21/23: 4\' 11"  (1.499 m).   Weight as of 11/21/23: 106 lb 14.4 oz (48.5 kg). Last encounter: 08/21/2023 Last procedure: Visit date not found.  HPI  Today, she is being contacted for medication management.   Pharmacotherapy Assessment  Opioid Analgesic: Tramadol  200 mg ER daily at 8:30 PM  Tramadol  50 mg IR at 10 AM and 6 PM  Monitoring: [place] PMP: PDMP reviewed during this encounter.       Pharmacotherapy: No side-effects or adverse reactions reported. Compliance: No problems identified. Effectiveness: Clinically acceptable. Plan: Refer to "POC". UDS: No results found for: "SUMMARY" No results found for: "CBDTHCR", "D8THCCBX", "D9THCCBX"   Laboratory Chemistry Profile   Renal Lab Results  Component Value Date   BUN 47 (H) 11/12/2023   CREATININE 1.39 (H) 11/12/2023   BCR 34 (H) 11/12/2023   GFRAA 37 (L) 11/28/2020   GFRNONAA 32 (L) 09/21/2021    Hepatic Lab Results  Component Value Date   AST 43 (H) 11/12/2023   ALT 30 11/12/2023   ALBUMIN 3.8 11/12/2023   ALKPHOS 201 (H) 11/12/2023   LIPASE 36 09/14/2019    Electrolytes Lab Results  Component  Value Date   NA 143 11/12/2023   K 4.6 11/12/2023   CL 99 11/12/2023   CALCIUM 9.9 11/12/2023   MG 1.9 12/04/2020    Bone No results found for: "VD25OH", "VD125OH2TOT", "BJ4782NF6", "OZ3086VH8", "25OHVITD1", "25OHVITD2", "25OHVITD3", "TESTOFREE", "TESTOSTERONE"  Inflammation (CRP: Acute Phase) (ESR: Chronic Phase) No results found for: "CRP", "ESRSEDRATE", "LATICACIDVEN"       Note: Above Lab results reviewed.  Imaging   CUP PACEART REMOTE DEVICE CHECK Monthly battery check.  Estimated 4 mos.  Normal device function. No new alerts. Follow up as scheduled monthly AB, CVRS  Assessment  The primary encounter diagnosis was Primary osteoarthritis of left knee. Diagnoses of Chronic pain syndrome, Primary osteoarthritis of right knee, Osteoarthritis of right shoulder due to rotator cuff injury, Osteoarthritis of right shoulder, unspecified osteoarthritis type, Bilateral primary osteoarthritis of knee, and Medication management were also pertinent to this visit.  Plan of Care  Problem-specific:  Ms. Licht does not report any other issues or concerns during this visit; however she requested that I can speak with her daughter.    Ms. TABBITHA JANVRIN has a current medication list which includes the following long-term medication(s): tramadol , tramadol , eliquis , hyoscyamine, metoprolol  succinate, omeprazole , and torsemide.  Pharmacotherapy (Medications Ordered): Meds ordered this encounter  Medications   traMADol  (ULTRAM -ER) 200 MG 24 hr tablet    Sig: Take 1 tablet (200 mg total) by mouth daily. At 9 pm, after dinner    Dispense:  30 tablet    Refill:  0   traMADol  (ULTRAM ) 50 MG tablet    Sig: Take 1 tablet (50 mg total) by mouth 2 (two) times daily. 50 mg at 10 am, 50 mg at 6pm    Dispense:  60 tablet    Refill:  3   Orders:  No orders of the defined types were placed in this encounter.  Follow-up plan:   No follow-ups on file.          Recent Visits Date Type  Provider Dept  12/11/23 Office Visit Cephus Collin, MD [facility]  Showing recent visits within past 90 days and meeting all other requirements Today's Visits Date Type Provider Dept  12/12/23 Office Visit Tyjai Charbonnet K, NP [facility]  Showing today's visits and meeting all other requirements Future Appointments No visits were found meeting these conditions. Showing future appointments within next 90 days and meeting all other requirements  I discussed the assessment and treatment plan with the patient. The patient was provided an opportunity to ask questions and all were answered. The patient agreed with the plan and demonstrated an understanding of the instructions.  Patient advised to call back or seek an in-person evaluation if the symptoms or condition worsens.  Duration of encounter: 20 minutes.  Note by: Cherylin Corrigan, NP Date: 12/12/2023; Time: 12:56 PM

## 2023-12-21 ENCOUNTER — Encounter: Payer: Self-pay | Admitting: Nurse Practitioner

## 2023-12-23 ENCOUNTER — Ambulatory Visit (INDEPENDENT_AMBULATORY_CARE_PROVIDER_SITE_OTHER)

## 2023-12-23 DIAGNOSIS — I495 Sick sinus syndrome: Secondary | ICD-10-CM | POA: Diagnosis not present

## 2024-01-01 NOTE — Progress Notes (Signed)
 Remote pacemaker transmission.

## 2024-01-10 ENCOUNTER — Telehealth: Payer: Self-pay

## 2024-01-10 ENCOUNTER — Ambulatory Visit

## 2024-01-10 NOTE — Telephone Encounter (Signed)
 LM for pt's Daughter to call me back to schedule her for PPM Gen Change with Dr. Marven Slimmer.

## 2024-01-10 NOTE — Telephone Encounter (Signed)
 Call received from Pt's daughter to see if December 23, 2023 transmission had been received.  Advised that transmission was not received.  It appears [employer] attempted to contact patient on 01/08/2024 and left message advising of missed transmission.  Transmission received today 01/10/2024.  Pt's pacemaker ERI as of 11/23/2023.  Daughter advised.  Per last OV note-ok to schedule Pt without another office visit.  Daughter advised Dr. Candace Cerise scheduler would contact her to set up generator change out for Pt.  Will forward to scheduler.  Await further needs.

## 2024-01-13 ENCOUNTER — Ambulatory Visit: Payer: Self-pay | Admitting: Cardiology

## 2024-01-13 ENCOUNTER — Telehealth: Payer: Self-pay

## 2024-01-13 DIAGNOSIS — I495 Sick sinus syndrome: Secondary | ICD-10-CM

## 2024-01-13 LAB — CUP PACEART REMOTE DEVICE CHECK
Battery Impedance: 9693 Ohm
Battery Voltage: 2.59 V
Brady Statistic RV Percent Paced: 98 %
Date Time Interrogation Session: 20250620130914
Implantable Pulse Generator Implant Date: 20141103
Lead Channel Impedance Value: 644 Ohm
Lead Channel Impedance Value: 67 Ohm
Lead Channel Setting Pacing Amplitude: 2 V
Lead Channel Setting Pacing Pulse Width: 0.4 ms
Lead Channel Setting Sensing Sensitivity: 2 mV
Zone Setting Status: 755011
Zone Setting Status: 755011

## 2024-01-13 NOTE — Telephone Encounter (Signed)
 2nd attempt to contact pt's Daughter to schedule PPM Gen Change. LM for her to call me back.

## 2024-01-13 NOTE — Telephone Encounter (Signed)
 Spoke with pt's Daughter - she is scheduled for PPM Gen Change with Dr. Cindie at [facility] on 7/21 at 9:30 AM.   She is at [facility] [place] and her Daughter will call to see if they can draw her labs prior to her procedure so she doesn't have to take her out to have them done.   She will call me back and let me know what she finds out.   I sent the Daughter a link to set up MyChart to communicate for her Mother.   I will email instruction letter to her Daughter once completed and also send a copy to her facility to ATTN: Dorthea Cecil, Nursing Director   Daughters email: [email or web address]

## 2024-01-23 ENCOUNTER — Ambulatory Visit

## 2024-01-23 DIAGNOSIS — I495 Sick sinus syndrome: Secondary | ICD-10-CM | POA: Diagnosis not present

## 2024-01-27 ENCOUNTER — Telehealth: Payer: Self-pay | Admitting: Nurse Practitioner

## 2024-01-27 ENCOUNTER — Other Ambulatory Visit: Payer: Self-pay | Admitting: *Deleted

## 2024-01-27 DIAGNOSIS — G894 Chronic pain syndrome: Secondary | ICD-10-CM

## 2024-01-27 MED ORDER — TRAMADOL HCL ER 200 MG PO TB24: 200.0000 mg | ORAL_TABLET | Freq: Every day | ORAL | 0 refills | Status: DC

## 2024-01-27 NOTE — Telephone Encounter (Signed)
 Rx request sent to S. [facility].

## 2024-01-27 NOTE — Telephone Encounter (Signed)
 Nurse from assist living home called stated that patient need Tramadol  prescription. Send to [facility] in [place]. Fax number us  [phone]. Please give them a call at [phone] TY

## 2024-01-28 ENCOUNTER — Telehealth: Payer: Self-pay

## 2024-01-28 ENCOUNTER — Other Ambulatory Visit: Payer: Self-pay

## 2024-01-28 ENCOUNTER — Encounter

## 2024-01-28 DIAGNOSIS — G894 Chronic pain syndrome: Secondary | ICD-10-CM

## 2024-01-28 MED ORDER — TRAMADOL HCL 50 MG PO TABS: 50.0000 mg | ORAL_TABLET | Freq: Two times a day (BID) | ORAL | 3 refills | Status: DC

## 2024-01-28 NOTE — Telephone Encounter (Signed)
 Refill request sent to Emmy Blanch, NP.

## 2024-01-28 NOTE — Telephone Encounter (Signed)
 They didn't get the script for Tramadol  50 mg bid. She got the extended release but not the other one. Can this be sent in, to last until 8/23  You can call the nurse [phone] [place]

## 2024-01-29 LAB — CUP PACEART REMOTE DEVICE CHECK
Battery Impedance: 10107 Ohm
Battery Voltage: 2.58 V
Brady Statistic RV Percent Paced: 98 %
Date Time Interrogation Session: 20250708132546
Implantable Pulse Generator Implant Date: 20141103
Lead Channel Impedance Value: 67 Ohm
Lead Channel Impedance Value: 680 Ohm
Lead Channel Setting Pacing Amplitude: 2 V
Lead Channel Setting Pacing Pulse Width: 0.4 ms
Lead Channel Setting Sensing Sensitivity: 2 mV
Zone Setting Status: 755011
Zone Setting Status: 755011

## 2024-01-30 ENCOUNTER — Encounter: Payer: Self-pay | Admitting: Emergency Medicine

## 2024-02-01 ENCOUNTER — Ambulatory Visit: Payer: Self-pay | Admitting: Cardiology

## 2024-02-03 ENCOUNTER — Telehealth (HOSPITAL_COMMUNITY): Payer: Self-pay

## 2024-02-03 NOTE — Telephone Encounter (Signed)
 Received a return call Amy, nurse at [facility].   Confirmed patient is scheduled for a PPM generator change, BIV Upgrade on Monday, July 21 with Dr. Ole Holts. Instructed for patient to arrive at the [facility] of [facility], [address], [place] [address] and check in at 8:30 AM.   Labs completed and faxed to [facility] from facility  Any recent signs of acute illness or been started on antibiotics? No Any new medications started? Hydroxyzine  Any medications to hold? Hold Eliquis  for 2 days prior to your procedure- last dose will be July 18. Medication instructions:  On the morning of your procedure hold Torsemide. You may take other morning medications with a small sip of water.  No eating or drinking after midnight prior to procedure.   The night before your procedure and the morning of your procedure, wash thoroughly with the CHG surgical soap from the neck down, paying special attention to the area where your procedure will be performed.  Advised of plan to go home the same day and will only stay overnight if medically necessary. You MUST have a responsible adult to drive you home and MUST be with you the first 24 hours after you arrive home.  Nurse verbalized understanding to all instructions provided and agreed to proceed with procedure.

## 2024-02-03 NOTE — Telephone Encounter (Signed)
 Attempted to reach Financial controller at [employer] at [place] facility to discuss upcoming procedure and if patient was able to have labs drawn at facility, no answer. Left VM to return call.

## 2024-02-10 ENCOUNTER — Encounter
Admission: RE | Disposition: A | Discharge status: Nursing Home (Includes ICF) | Payer: Self-pay | Source: Home / Self Care | Attending: Cardiology

## 2024-02-10 ENCOUNTER — Other Ambulatory Visit: Payer: Self-pay

## 2024-02-10 ENCOUNTER — Ambulatory Visit
Admission: RE | Admit: 2024-02-10 | Discharge: 2024-02-10 | Disposition: A | Discharge status: Other Acute Inpatient Hospital | Attending: Cardiology | Admitting: Cardiology

## 2024-02-10 ENCOUNTER — Encounter: Payer: Self-pay | Admitting: Cardiology

## 2024-02-10 DIAGNOSIS — I447 Left bundle-branch block, unspecified: Secondary | ICD-10-CM | POA: Diagnosis not present

## 2024-02-10 DIAGNOSIS — R001 Bradycardia, unspecified: Secondary | ICD-10-CM | POA: Insufficient documentation

## 2024-02-10 DIAGNOSIS — F411 Generalized anxiety disorder: Secondary | ICD-10-CM | POA: Diagnosis not present

## 2024-02-10 DIAGNOSIS — X58XXXA Exposure to other specified factors, initial encounter: Secondary | ICD-10-CM | POA: Insufficient documentation

## 2024-02-10 DIAGNOSIS — I10 Essential (primary) hypertension: Secondary | ICD-10-CM | POA: Diagnosis not present

## 2024-02-10 DIAGNOSIS — I4891 Unspecified atrial fibrillation: Secondary | ICD-10-CM | POA: Insufficient documentation

## 2024-02-10 DIAGNOSIS — Z45010 Encounter for checking and testing of cardiac pacemaker pulse generator [battery]: Secondary | ICD-10-CM

## 2024-02-10 DIAGNOSIS — S91309A Unspecified open wound, unspecified foot, initial encounter: Secondary | ICD-10-CM | POA: Insufficient documentation

## 2024-02-10 DIAGNOSIS — Z7901 Long term (current) use of anticoagulants: Secondary | ICD-10-CM | POA: Insufficient documentation

## 2024-02-10 DIAGNOSIS — G8929 Other chronic pain: Secondary | ICD-10-CM | POA: Diagnosis not present

## 2024-02-10 DIAGNOSIS — E039 Hypothyroidism, unspecified: Secondary | ICD-10-CM | POA: Diagnosis not present

## 2024-02-10 DIAGNOSIS — Z853 Personal history of malignant neoplasm of breast: Secondary | ICD-10-CM | POA: Diagnosis not present

## 2024-02-10 DIAGNOSIS — Z8673 Personal history of transient ischemic attack (TIA), and cerebral infarction without residual deficits: Secondary | ICD-10-CM | POA: Insufficient documentation

## 2024-02-10 DIAGNOSIS — E785 Hyperlipidemia, unspecified: Secondary | ICD-10-CM | POA: Diagnosis not present

## 2024-02-10 HISTORY — PX: PPM GENERATOR CHANGEOUT: EP1233

## 2024-02-10 SURGERY — PPM GENERATOR CHANGEOUT
Anesthesia: Moderate Sedation

## 2024-02-10 MED ORDER — ACETAMINOPHEN 325 MG PO TABS: 325.0000 mg | ORAL_TABLET | ORAL | Status: DC | PRN

## 2024-02-10 MED ORDER — SODIUM CHLORIDE 0.9 % IV SOLN
INTRAVENOUS | Status: DC
Start: 2024-02-10 — End: 2024-02-10

## 2024-02-10 MED ORDER — CEFAZOLIN SODIUM-DEXTROSE 2-4 GM/100ML-% IV SOLN
INTRAVENOUS | Status: AC
  Filled 2024-02-10: qty 100

## 2024-02-10 MED ORDER — LIDOCAINE HCL (PF) 1 % IJ SOLN
INTRAMUSCULAR | Status: AC
  Filled 2024-02-10: qty 60

## 2024-02-10 MED ORDER — POVIDONE-IODINE 10 % EX SWAB: 2.0000 | Freq: Once | CUTANEOUS | Status: DC

## 2024-02-10 MED ORDER — CEFAZOLIN SODIUM-DEXTROSE 2-4 GM/100ML-% IV SOLN
2.0000 g | INTRAVENOUS | Status: AC
  Administered 2024-02-10: 2 g via INTRAVENOUS

## 2024-02-10 MED ORDER — LIDOCAINE HCL (PF) 1 % IJ SOLN
INTRAMUSCULAR | Status: DC | PRN
  Administered 2024-02-10: 50 mL

## 2024-02-10 MED ORDER — ONDANSETRON HCL 4 MG/2ML IJ SOLN: 4.0000 mg | Freq: Four times a day (QID) | INTRAMUSCULAR | Status: DC | PRN

## 2024-02-10 MED ORDER — SODIUM CHLORIDE 0.9 % IV SOLN
80.0000 mg | INTRAVENOUS | Status: AC
  Administered 2024-02-10: 80 mg
  Filled 2024-02-10: qty 2

## 2024-02-10 MED ORDER — CHLORHEXIDINE GLUCONATE 4 % EX SOLN: 4.0000 | Freq: Once | CUTANEOUS | Status: DC

## 2024-02-10 SURGICAL SUPPLY — 8 items
CABLE SURG 12 DISP A/V CHANNEL (MISCELLANEOUS) IMPLANT
DEVICE DSSCT PLSMBLD 3.0S LGHT (MISCELLANEOUS) IMPLANT
IPG PACE AZUR XT DR MRI W1DR01 (Pacemaker) IMPLANT
PAD ELECT DEFIB RADIOL ZOLL (MISCELLANEOUS) IMPLANT
POUCH AIGIS-R ANTIBACT PPM MED (Mesh General) IMPLANT
SUTURE DVC V-LC4-0 90 CLR P-12 (SUTURE) IMPLANT
SYR CONTROL 10ML ANGIOGRAPHIC (SYRINGE) IMPLANT
TRAY PACEMAKER INSERTION (PACKS) ×1 IMPLANT

## 2024-02-10 NOTE — Discharge Instructions (Signed)

## 2024-02-10 NOTE — H&P (Signed)
 Electrophysiology Office Note:     Date:  02/10/2024    ID:  Sue Knapp, DOB [birth year], MRN [id number]   [facility] Cardiologist:  None  [facility] Electrophysiologist:  Sue ONEIDA HOLTS, MD    Referring MD: Sue King, MD    Chief Complaint: Pacemaker establish care   History of Present Illness:     [name] is a 89 year old woman who presents to establish care for permanent pacemaker.  Her medical history includes TIA, breast cancer, left bundle branch block, hypothyroidism, hypertension, hyperlipidemia.  She also has a history of generalized anxiety disorder, chronic pain, lower extremity wounds, atrial fibrillation on Eliquis .     There is a pacemaker check from August 2023 showing that she was dependent on ventricular pacing.  She was programmed to a lower rate of 60 bpm.  There were 22 months expected battery longevity at that time.  Her lead parameters were stable.  The device was implanted May 25, 2013 and was previously followed by Dr. Delena.   She is with her daughter today in clinic. The patient reports several small open wounds on her feet secondary to a therapy shoe rubbing the skin. There is another area where she had weeping of fluid. No fevers/chills. Daughter asks about doing the generator change prior to ERI.  Presents for generator replacement today.     Objective Their past medical, social and family history was reviewed.     ROS:   Please see the history of present illness.    All other systems reviewed and are negative.   EKGs/Labs/Other Studies Reviewed:     The following studies were reviewed today:   October 23, 2023 in clinic device interrogation personally reviewed Battery longevity 6 months Lead parameters stable Adapta device Vp 97.9%       Dec 01, 2020 2 view chest x-ray shows dual-chamber permanent pacemaker with passive fixation wires.  Leads in expected locations.          Physical Exam:     VS:  BP 159/64  (BP Location: Right Arm, Patient Position: Sitting, Cuff Size: Normal)   Pulse 67   Ht 4' 11 (1.499 m)   Wt 110 lb (49.9 kg)   SpO2 96%   BMI 22.22 kg/m         Wt Readings from Last 3 Encounters:  10/23/23 110 lb (49.9 kg)  08/21/23 110 lb (49.9 kg)  12/07/21 116 lb (52.6 kg)      GEN: no distress. Elderly. CARD: RRR, No MRG RESP: No IWOB. CTAB.         Assessment ASSESSMENT AND PLAN:     1. Cardiac pacemaker in situ   2. Atrial fibrillation, unspecified type (HCC)       #Permanent pacemaker in situ The patient presents to establish care for her permanent pacemaker that was implanted in 2014.  He was previously followed by Dr. Delena.  Leads are passive fixation.  Device is left-sided.  Leads are in appropriate position on chest x-ray from 2022.   Will establish for remote monitoring in our clinic. I did review the generator replacement procedure today. When she reaches ERI, OK to schedule.  Will need to hold OAC for 48 hours prior to procedure to minimize bleeding risks.   Risks, benefits, and alternatives to pulse generator replacement were discussed in detail today.  The patient understands that risks include but are not limited to bleeding, infection, pneumothorax, perforation, tamponade, vascular damage, renal failure, MI, stroke, death,  inappropriate shocks, damage to his existing leads, and lead dislodgement and wishes to proceed when device is at [facility]       #Atrial fibrillation Appears to be permanent.  On Eliquis  for stroke prophylaxis.     Presents for generator replacement today.       Signed, Sue DASEN. Cindie, MD, [facility], [facility] 02/10/2024 Electrophysiology [facility]

## 2024-02-10 NOTE — Progress Notes (Signed)
 Remote pacemaker transmission.

## 2024-02-10 NOTE — Progress Notes (Signed)
 Patient discharged to [facility] [place] via wheelchair van accompanied by daughter. VSS no bleeding or hematoma noted at incision site.

## 2024-02-24 ENCOUNTER — Encounter

## 2024-02-24 NOTE — Progress Notes (Unsigned)

## 2024-02-25 ENCOUNTER — Ambulatory Visit: Attending: Cardiology | Admitting: Cardiology

## 2024-02-25 ENCOUNTER — Ambulatory Visit

## 2024-02-25 DIAGNOSIS — Z950 Presence of cardiac pacemaker: Secondary | ICD-10-CM | POA: Diagnosis not present

## 2024-02-25 DIAGNOSIS — I495 Sick sinus syndrome: Secondary | ICD-10-CM | POA: Diagnosis not present

## 2024-02-25 LAB — CUP PACEART INCLINIC DEVICE CHECK
Date Time Interrogation Session: 20250805093641
Implantable Lead Connection Status: 753985
Implantable Lead Connection Status: 753985
Implantable Lead Implant Date: 20141103
Implantable Lead Implant Date: 20141103
Implantable Lead Location: 753859
Implantable Lead Location: 753860
Implantable Lead Model: 5092
Implantable Lead Model: 5592
Implantable Pulse Generator Implant Date: 20250721

## 2024-02-25 NOTE — Patient Instructions (Addendum)
 Medication Instructions:  The current medical regimen is effective;  continue present plan and medications as directed. Please refer to the Current Medication list given to you today.   *If you need a refill on your cardiac medications before your next appointment, please call your pharmacy*  Follow-Up: At [facility], you and your health needs are our priority.  As part of our continuing mission to provide you with exceptional heart care, our providers are all part of one team.  This team includes your primary Cardiologist (physician) and Advanced Practice Providers or APPs (Physician Assistants and Nurse Practitioners) who all work together to provide you with the care you need, when you need it.  Your next appointment:   Keep follow up as scheduled.   We recommend signing up for the patient portal called MyChart.  Sign up information is provided on this After Visit Summary.  MyChart is used to connect with patients for Virtual Visits (Telemedicine).  Patients are able to view lab/test results, encounter notes, upcoming appointments, etc.  Non-urgent messages can be sent to your provider as well.   To learn more about what you can do with MyChart, go to [email or web address].   Other Instructions   OK to shower Let warm soapy water run down incision Do not scrub incision Pat dry with towel  Keep incision open to air - no ointments, creams, salves, or bandages.  Call device clinic if incision opens, has drainage, or begins to hurt more.

## 2024-02-26 ENCOUNTER — Ambulatory Visit: Payer: Self-pay | Admitting: Cardiology

## 2024-03-12 ENCOUNTER — Ambulatory Visit: Attending: Nurse Practitioner | Admitting: Nurse Practitioner

## 2024-03-12 DIAGNOSIS — G894 Chronic pain syndrome: Secondary | ICD-10-CM

## 2024-03-12 DIAGNOSIS — Z91199 Patient's noncompliance with other medical treatment and regimen due to unspecified reason: Secondary | ICD-10-CM

## 2024-03-12 NOTE — Progress Notes (Signed)
 03/12/2024-Call patient 3 to 4 times however her voice mail full and never picked up so her VV R/S

## 2024-03-20 ENCOUNTER — Telehealth: Payer: Self-pay | Admitting: Nurse Practitioner

## 2024-03-20 NOTE — Telephone Encounter (Signed)
 Patent daughter stated that patient is aging and have trouble hearing.When can her vv appointments be handle with the nursing staff at the facility.Please give daughter a call.TY

## 2024-03-20 NOTE — Telephone Encounter (Signed)
 This needs to be discussed with the provider.

## 2024-03-24 DIAGNOSIS — M1A9XX1 Chronic gout, unspecified, with tophus (tophi): Secondary | ICD-10-CM | POA: Insufficient documentation

## 2024-03-24 NOTE — Telephone Encounter (Signed)
 Attempted to return call, no answer and mailbox is full

## 2024-03-24 NOTE — Telephone Encounter (Signed)
 Problem handled per patient.

## 2024-03-26 ENCOUNTER — Encounter

## 2024-03-30 ENCOUNTER — Ambulatory Visit: Attending: Nurse Practitioner | Admitting: Nurse Practitioner

## 2024-03-30 DIAGNOSIS — G894 Chronic pain syndrome: Secondary | ICD-10-CM | POA: Diagnosis not present

## 2024-03-30 DIAGNOSIS — Z79899 Other long term (current) drug therapy: Secondary | ICD-10-CM

## 2024-03-30 DIAGNOSIS — M1711 Unilateral primary osteoarthritis, right knee: Secondary | ICD-10-CM

## 2024-03-30 DIAGNOSIS — M170 Bilateral primary osteoarthritis of knee: Secondary | ICD-10-CM

## 2024-03-30 DIAGNOSIS — M1712 Unilateral primary osteoarthritis, left knee: Secondary | ICD-10-CM

## 2024-03-30 DIAGNOSIS — S46001S Unspecified injury of muscle(s) and tendon(s) of the rotator cuff of right shoulder, sequela: Secondary | ICD-10-CM | POA: Diagnosis not present

## 2024-03-30 DIAGNOSIS — M19111 Post-traumatic osteoarthritis, right shoulder: Secondary | ICD-10-CM

## 2024-03-30 MED ORDER — TRAMADOL HCL 50 MG PO TABS: 50.0000 mg | ORAL_TABLET | Freq: Two times a day (BID) | ORAL | 3 refills | Status: DC

## 2024-03-30 MED ORDER — TRAMADOL HCL ER 200 MG PO TB24: 200.0000 mg | ORAL_TABLET | Freq: Every day | ORAL | 0 refills | Status: DC

## 2024-03-30 NOTE — Progress Notes (Signed)
 PROVIDER NOTE: Interpretation of information contained herein should be left to medically-trained personnel. Specific patient instructions are provided elsewhere under Patient Instructions section of medical record. This document was created in part using AI and STT-dictation technology, any transcriptional errors that may result from this process are unintentional.  Patient: Sue Knapp Staff  Service: E/M   PCP: Lenon Layman ORN, MD  DOB: Mar 14, 1923  DOS: 03/30/2024  Provider: Emmy MARLA Blanch, NP  MRN: [id number]  Delivery: Virtual Visit  Specialty: Interventional Pain Management  Type: Established Patient  Setting: Ambulatory outpatient facility  Specialty designation: 09  Referring Prov.: Lenon Layman ORN, MD  Location: Remote location       Virtual Encounter - Pain Management PROVIDER NOTE: Information contained herein reflects review and annotations entered in association with encounter. Interpretation of such information and data should be left to medically-trained personnel. Information provided to patient can be located elsewhere in the medical record under Patient Instructions. Document created using STT-dictation technology, any transcriptional errors that may result from process are unintentional.    Contact & Pharmacy Preferred: [phone] Home: [phone] (home) Mobile: [phone] (mobile) E-mail: No e-mail address on record  [facility] - [place], [place] [address] [place] [address] Phone: [phone] Fax: [phone]   Pre-screening  Ms. Staff offered in-person vs virtual encounter. She indicated preferring virtual for this encounter.   Reason COVID-19*  Social distancing based on CDC and AMA recommendations.   I contacted Sue Knapp Staff on 03/30/2024 via telephone.      I clearly identified myself as Emmy MARLA Blanch, NP. I verified that I was speaking with the correct person using two  identifiers (Name: Sue Knapp, and date of birth: 1922/08/23).  Consent I sought verbal advanced consent from Sue Knapp Staff for virtual visit interactions. I informed Ms. Gladwin of possible security and privacy concerns, risks, and limitations associated with providing not-in-person medical evaluation and management services. I also informed Ms. Brilliant of the availability of in-person appointments. Finally, I informed her that there would be a charge for the virtual visit and that she could be  personally, fully or partially, financially responsible for it. Ms. Ahner expressed understanding and agreed to proceed.   Historic Elements   Sue Knapp is a 89 y.o. year old, female patient evaluated today after our last contact on 03/20/2024. Ms. Hohmann  has a past medical history of Arthritis, Breast cancer (HCC) (2013), Cataract, Fatigue, GERD (gastroesophageal reflux disease), Glaucoma, Heart murmur, Heart palpitations, Hiatal hernia, HOH (hard of hearing), Hyperlipidemia, Hypertension, Hypothyroidism, Left ankle injury, Left bundle branch block, Neuropathy, Osteoporosis, Pacemaker, Palpitations, Personal history of radiation therapy (2013), Scoliosis, Shortness of breath dyspnea, Thyroid nodule, and TIA (transient ischemic attack). She also  has a past surgical history that includes Thyroidectomy, partial; Abdominal hysterectomy; Breast surgery; Cataract extraction w/PHACO (Right, 03/01/2015); Cataract extraction w/PHACO (Left, 03/22/2015); Mastectomy partial / lumpectomy; Breast lumpectomy (Left, 02/21/2012); Breast biopsy (Left, 2005); Breast biopsy (Left, 2013); Eye surgery (09/2020 and 10/2020); Esophagogastroduodenoscopy (egd) with propofol  (N/A, 12/04/2020); Esophagogastroduodenoscopy (egd) with propofol  (N/A, 12/04/2020); and PPM GENERATOR CHANGEOUT (N/A, 02/10/2024). Sue Knapp has a current medication list which includes the following prescription(s): acetaminophen , eliquis ,  hydroxyzine, hyoscyamine, azo dual protection, losartan , melatonin, metoprolol  succinate, multivitamin with minerals, nyamyc, omeprazole , polyethylene glycol, torsemide, tramadol , tramadol , and zinc oxide. She  reports that she has never smoked. She has never used smokeless tobacco. She reports that she does not drink alcohol and does not  use drugs. Sue Knapp is allergic to codeine.  BMI: Estimated body mass index is 21.41 kg/m as calculated from the following:   Height as of 02/10/24: 4' 11 (1.499 m).   Weight as of 02/10/24: 106 lb (48.1 kg). Last encounter: 03/12/2024. Last procedure: Visit date not found.  HPI  Today, she is being contacted for medication management. No change in medical history since last visit.  Patient's pain is at baseline.  Patient continues multimodal pain regimen as prescribed.  States that it provides pain relief and improvement in functional status.   Pharmacotherapy Assessment  Tramadol  200 mg ER daily at 8:30 PM. Tramadol  50 mg IR at 10 AM and 6 PM. Monitoring: [place] PMP: PDMP reviewed during this encounter.       Pharmacotherapy: No side-effects or adverse reactions reported. Compliance: No problems identified. Effectiveness: Clinically acceptable. Plan: Refer to POC.  UDS: No results found for: SUMMARY No results found for: CBDTHCR, D8THCCBX, D9THCCBX  Laboratory Chemistry Profile   Renal Lab Results  Component Value Date   BUN 47 (H) 11/12/2023   CREATININE 1.39 (H) 11/12/2023   BCR 34 (H) 11/12/2023   GFRAA 37 (L) 11/28/2020   GFRNONAA 32 (L) 09/21/2021    Hepatic Lab Results  Component Value Date   AST 43 (H) 11/12/2023   ALT 30 11/12/2023   ALBUMIN 3.8 11/12/2023   ALKPHOS 201 (H) 11/12/2023   LIPASE 36 09/14/2019    Electrolytes Lab Results  Component Value Date   NA 143 11/12/2023   K 4.6 11/12/2023   CL 99 11/12/2023   CALCIUM 9.9 11/12/2023   MG 1.9 12/04/2020    Bone No results found for: VD25OH, VD125OH2TOT,  CI6874NY7, CI7874NY7, 25OHVITD1, 25OHVITD2, 25OHVITD3, TESTOFREE, TESTOSTERONE  Inflammation (CRP: Acute Phase) (ESR: Chronic Phase) No results found for: CRP, ESRSEDRATE, LATICACIDVEN       Note: Above Lab results reviewed.  Imaging  CUP PACEART INCLINIC DEVICE CHECK Normal _dual__ chamber pacemaker wound check, programmed VVI for perm AFib. Presenting rhythm: _AFib - VP/VS__ . Wound well healed. Routine testing performed. Thresholds, sensing, and impedances stable and at auto capture. No episodes. Reviewed wound  care.  Pt enrolled in remote follow-up.  CANDIE Needle, NP  Assessment  The primary encounter diagnosis was Primary osteoarthritis of left knee. 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  Plan of Care  Problem-specific:  Will continue on current medication regimen.  Prescription drug monitoring (PMP) reviewed.  No other issues or problems prior to this visit.  Schedule follow-up in 90 days for virtual visit for medication management.  Ms. LARKIN ALFRED has a current medication list which includes the following long-term medication(s): eliquis , hyoscyamine, metoprolol  succinate, omeprazole , torsemide, tramadol , and tramadol .  Pharmacotherapy (Medications Ordered): Meds ordered this encounter  Medications   traMADol  (ULTRAM ) 50 MG tablet    Sig: Take 1 tablet (50 mg total) by mouth 2 (two) times daily. 50 mg at 10 am, 50 mg at 6pm    Dispense:  60 tablet    Refill:  3   traMADol  (ULTRAM -ER) 200 MG 24 hr tablet    Sig: Take 1 tablet (200 mg total) by mouth daily. At 9 pm, after dinner    Dispense:  90 tablet    Refill:  0   Orders:  No orders of the defined types were placed in this encounter.  Follow-up plan:  Return in about 3 months (around 06/29/2024) for (VV), (MM), Emmy Blanch NP.           Recent Visits Date Type Provider Dept  03/12/24 Office Visit Wendee Hata K, NP [facility]  Showing recent visits within past 90 days and meeting all other requirements Today's Visits Date Type Provider Dept  03/30/24 Office Visit Dejion Grillo K, NP [facility]  Showing today's visits and meeting all other requirements Future Appointments No visits were found meeting these conditions. Showing future appointments within next 90 days and meeting all other requirements  I discussed the assessment and treatment plan with the patient. The patient was provided an opportunity to ask questions and all were answered. The patient agreed with the plan and demonstrated an understanding of the instructions.  Patient advised to call back or seek an in-person evaluation if the symptoms or condition worsens.  Duration of encounter: 30 minutes.  Note by: Emmy MARLA Blanch, NP Date: 03/30/2024; Time: 2:52 PM

## 2024-04-13 ENCOUNTER — Encounter

## 2024-04-13 ENCOUNTER — Ambulatory Visit (INDEPENDENT_AMBULATORY_CARE_PROVIDER_SITE_OTHER)

## 2024-04-13 DIAGNOSIS — I495 Sick sinus syndrome: Secondary | ICD-10-CM

## 2024-04-14 ENCOUNTER — Telehealth: Payer: Self-pay

## 2024-04-14 LAB — CUP PACEART REMOTE DEVICE CHECK
Battery Remaining Longevity: 157 mo
Battery Voltage: 3.22 V
Brady Statistic AP VP Percent: 0 %
Brady Statistic AP VS Percent: 0 %
Brady Statistic AS VP Percent: 96.25 %
Brady Statistic AS VS Percent: 3.75 %
Brady Statistic RA Percent Paced: 0 %
Brady Statistic RV Percent Paced: 96.25 %
Date Time Interrogation Session: 20250923141431
Implantable Lead Connection Status: 753985
Implantable Lead Connection Status: 753985
Implantable Lead Implant Date: 20141103
Implantable Lead Implant Date: 20141103
Implantable Lead Location: 753859
Implantable Lead Location: 753860
Implantable Lead Model: 5092
Implantable Lead Model: 5592
Implantable Pulse Generator Implant Date: 20250721
Lead Channel Impedance Value: 266 Ohm
Lead Channel Impedance Value: 342 Ohm
Lead Channel Impedance Value: 418 Ohm
Lead Channel Impedance Value: 513 Ohm
Lead Channel Pacing Threshold Amplitude: 0.75 V
Lead Channel Pacing Threshold Pulse Width: 0.4 ms
Lead Channel Sensing Intrinsic Amplitude: 3.875 mV
Lead Channel Sensing Intrinsic Amplitude: 3.875 mV
Lead Channel Setting Pacing Amplitude: 2 V
Lead Channel Setting Pacing Pulse Width: 0.4 ms
Lead Channel Setting Sensing Sensitivity: 2 mV
Zone Setting Status: 755011

## 2024-04-14 NOTE — Telephone Encounter (Signed)
 Pt daughter called in stating that she is going to send a manual and would like a call back to go over her symptoms once received. Pt is in a nursing facility and daughter would like a call back today

## 2024-04-14 NOTE — Telephone Encounter (Signed)
 Attempted outreach to patient's daughter Niels. NO answer. LMTCB

## 2024-04-14 NOTE — Telephone Encounter (Signed)
 Spoke with patient's daughter after reviewing transmission   Device is functioning normally with no recent episodes   Daughter was concerned about the patient's altered mental status and that it might be related to her pacemaker  This RN reassured the daughter that it is not related to the patient's cardiac device and to bring any further issues up with their PCP  All questions and concerns addressed at this time  Daughter very appreciative for call back

## 2024-04-14 NOTE — Telephone Encounter (Signed)
 Attempted to reach patient' daughter, no answer.  LM on daughter's cell to call us  back in clinic.   Device Transmission received:  Implant 02/10/24, dual chamber PPM programmed [id number] 60 Normal Device Function 1 NSVT on 04/04/24, 6:29 am. 22 beat duration RV lead trends normal, stable. No other episodes.

## 2024-04-15 NOTE — Progress Notes (Signed)
 Remote PPM Transmission

## 2024-04-27 ENCOUNTER — Encounter

## 2024-05-01 NOTE — Progress Notes (Signed)
 Remote PPM Transmission

## 2024-05-08 ENCOUNTER — Telehealth: Payer: Self-pay

## 2024-05-08 NOTE — Telephone Encounter (Signed)
 I left a voicemail for patient's daughter Nadia) regarding new patient information to be completed prior to check-in - we can mail the packet, or they may come early to complete prior to check-in.

## 2024-05-08 NOTE — Telephone Encounter (Signed)
 I did call patient before calling daughter, but there was no answer.  I called patient again after leaving voicemail for daughter, and patient answered, but she was unable to hear me.

## 2024-05-12 ENCOUNTER — Telehealth: Payer: Self-pay | Admitting: *Deleted

## 2024-05-12 NOTE — Telephone Encounter (Signed)
 Patient's daughter Niels called into clinic and left VM regarding patient's 90 day post op device check appointment   This RN attempted to return phone call x2. No answer either time. Unable to leave voicemail as the box is full  Patient's MyChart is not active and code is expired  Routing to device pool to attempt follow up with daughter tomorrow

## 2024-05-13 ENCOUNTER — Ambulatory Visit: Admitting: Cardiology

## 2024-05-13 NOTE — Telephone Encounter (Signed)
 Call back received from daughter.  Directions/appointment information given to daughter.  All questions answered.

## 2024-05-13 NOTE — Telephone Encounter (Signed)
 Attempted to contact daughter.  Call went to VM.  Unable to leave message d/t voice mail full.

## 2024-05-14 ENCOUNTER — Ambulatory Visit: Admitting: Family

## 2024-05-14 ENCOUNTER — Encounter: Payer: Self-pay | Admitting: Family

## 2024-05-14 VITALS — BP 100/70 | Temp 98.7°F | Ht 59.0 in | Wt 106.1 lb

## 2024-05-14 DIAGNOSIS — I739 Peripheral vascular disease, unspecified: Secondary | ICD-10-CM | POA: Diagnosis not present

## 2024-05-14 DIAGNOSIS — G894 Chronic pain syndrome: Secondary | ICD-10-CM

## 2024-05-14 DIAGNOSIS — R413 Other amnesia: Secondary | ICD-10-CM | POA: Diagnosis not present

## 2024-05-14 NOTE — Progress Notes (Signed)
 Assessment & Plan:  PAD (peripheral artery disease) Assessment & Plan: Discussed my grave concern for risk of limb loss, infection and gangrene based on discoloration and absence of bilateral pedal pulse.  Daughter and I agreed patient would need urgent vascular evaluation.  Daughter politely declines going to the emergency room due to stress it would cause a 89 year old mother.  This is certainly understandable.  We were able to call [facility] to schedule ultrasound tomorrow ahead of follow-up with vascular.  Advised over the weekend if patient were to develop pain, further discoloration, to have a low threshold to presenting to the emergency room for urgent evaluation, salvage of limbs.   Memory change Assessment & Plan: Memory changes appear  nonacute.  Of note, patient is difficult to assess as she is hard of hearing.  Pending labs, repeat urine, CT head.  Strict ED precautions given and discussed symptoms of CVA with daughter.  Orders: -     TSH -     CBC with Differential/Platelet -     Comprehensive metabolic panel with GFR -     Hemoglobin A1c -     Lipid panel -     RPR -     B12 and Folate Panel -     CT HEAD WO CONTRAST ( ); Future -     Urinalysis, Routine w reflex microscopic; Future -     Urine Culture; Future  Chronic pain syndrome Assessment & Plan: Chronic, stable.  Patient is following with pain management whom is prescribing tramadol .  Will follow.      Return precautions given.   Risks, benefits, and alternatives of the medications and treatment plan prescribed today were discussed, and patient expressed understanding.   Education regarding symptom management and diagnosis given to patient on AVS either electronically or printed.  Return in about 2 weeks (around 05/28/2024).  Sue Northern, FNP  Subjective:    Patient ID: Sue Knapp, female    DOB: [birth year], 89 y.o.   MRN: [id number]  CC: Sue Knapp is a 89 y.o.  female who presents today for follow up.   HPI: Daughter is primary historian. Patient is HOH.   Discussed the use of AI scribe software for clinical note transcription with the patient, who gave verbal consent to proceed.  History of Present Illness   Sue Knapp is a 89 year old female who presents with agitation, memory loss, and suspected circulation issues in her feet.  She has been experiencing agitation and memory loss since late July to early August 2025. The memory loss is atypical, as she forgets past events but remembers recent occurrences. The agitation began around the time of her pacemaker battery change on August 2025. She was suspected to have a urinary tract infection around this time, which was confirmed and treated with antibiotics, including a course of Bactrim in September 2025. Despite treatment, her symptoms of transient agitation and memory issues persisted.  Daughter shares concern for discoloration of bilateral feet., describes coldness and discoloration.  Patient denies any pain in her feet.  She states breathing is okay.  Denies shortness of breath, orthopnea.  She is currently on multiple medications for pain management, including tramadol  200 mg time-released around the clock and 50 mg scheduled at 10 AM and 6 PM. She also takes Tylenol  as needed.   Her appetite is less than average, and she eats about 75% of her usual intake, primarily consuming breakfast. There has been a slow  weight loss noted.  She resides at the [facility], currently on the third floor in nursing care. She previously lived in assisted living and independent living within the same community.  I am doing good I hear Following Dr Cindie cardiology for SSS, pacemaker  Follow up cardiology 05/19/24 Follow up vascular 05/18/24  Follow up pain management 06/25/24  Crt 1.36 02/03/24  Allergies: Codeine Current Outpatient Medications on File Prior to Visit  Medication Sig Dispense  Refill   Multiple Vitamins-Minerals (MULTIVITAMIN WITH IRON-MINERALS) liquid Take by mouth daily.     acetaminophen  (TYLENOL ) 650 MG CR tablet Take 2 tablets (1,300 mg total) by mouth 2 (two) times daily as needed for pain.     ELIQUIS  2.5 MG TABS tablet TAKE 1 TABLET BY MOUTH TWICE DAILY 60 tablet 6   hydrOXYzine (ATARAX) 10 MG tablet Take 10 mg by mouth every 12 (twelve) hours as needed for anxiety.     hyoscyamine (LEVSIN) 0.125 MG tablet Take 0.125 mg by mouth every 8 (eight) hours as needed (bloating, GI upset).     Lactobacillus (AZO DUAL PROTECTION) CAPS Take 1 capsule by mouth daily. 30 capsule 11   losartan  (COZAAR ) 50 MG tablet Take 50 mg by mouth daily.     melatonin 5 MG TABS Take 5 mg by mouth at bedtime as needed (insomnia).     metoprolol  succinate (TOPROL -XL) 50 MG 24 hr tablet TAKE 1 TABLET BY MOUTH DAILY 30 tablet 6   Multiple Vitamins-Minerals (MULTIVITAMIN WITH MINERALS) tablet Take 1 tablet by mouth daily. Centrum silver 50+     NYAMYC powder Apply topically.     omeprazole  (PRILOSEC  OTC) 20 MG tablet Take 1 tablet (20 mg total) by mouth daily. 30 tablet 0   polyethylene glycol (MIRALAX / GLYCOLAX) 17 g packet Take 17 g by mouth at bedtime as needed for mild constipation, moderate constipation or severe constipation.     torsemide (DEMADEX) 20 MG tablet Take 20 mg by mouth daily.     traMADol  (ULTRAM ) 50 MG tablet Take 1 tablet (50 mg total) by mouth 2 (two) times daily. 50 mg at 10 am, 50 mg at 6pm 60 tablet 3   traMADol  (ULTRAM -ER) 200 MG 24 hr tablet Take 1 tablet (200 mg total) by mouth daily. At 9 pm, after dinner 90 tablet 0   zinc oxide (BALMEX) 11.3 % CREA cream Apply 1 Application topically 2 (two) times daily as needed.     No current facility-administered medications on file prior to visit.    Review of Systems  Constitutional:  Negative for chills and fever.  Respiratory:  Negative for cough and shortness of breath.   Cardiovascular:  Negative for chest pain,  palpitations and leg swelling.  Gastrointestinal:  Negative for nausea and vomiting.      Objective:    BP 100/70   Temp 98.7 F (37.1 C) (Oral)   Ht 4' 11 (1.499 m)   Wt 106 lb 1.6 oz (48.1 kg)   BMI 21.43 kg/m  BP Readings from Last 3 Encounters:  05/18/24 (!) 147/74  05/14/24 100/70  02/10/24 109/82   Wt Readings from Last 3 Encounters:  05/14/24 106 lb 1.6 oz (48.1 kg)  02/10/24 106 lb (48.1 kg)  11/21/23 106 lb 14.4 oz (48.5 kg)  Note: Unable to obtain pulse oximetry with either hand.  Attempted on ear.  No acute respiratory distress.  Patient is not labored in speech.  Physical Exam Vitals reviewed.  Constitutional:  Appearance: She is well-developed.  Eyes:     Conjunctiva/sclera: Conjunctivae normal.  Cardiovascular:     Rate and Rhythm: Normal rate and regular rhythm.     Pulses: Normal pulses.     Heart sounds: Normal heart sounds.     Comments: Absent pedal pulses.  Cold bilateral lower extremities with discoloration.   Pulmonary:     Effort: Pulmonary effort is normal.     Breath sounds: Normal breath sounds. No wheezing, rhonchi or rales.  Skin:    General: Skin is warm and dry.  Neurological:     Mental Status: She is alert.  Psychiatric:        Speech: Speech normal.        Behavior: Behavior normal.        Thought Content: Thought content normal.

## 2024-05-14 NOTE — Patient Instructions (Addendum)
 Consider scheduling tylenol  arthritis and decreasing doses of tramadol .  I do not advise taking oxycodone  unless severe severe pain due to risk of excessive sedation, confusion.   Please bring urine in as soon as you can so we can evaluate for urinary tract infection.   Stat CT head due to memory changes.   Ultrasound tomorrow with  [facility] at 12:45   [address], [place], [place] [address]  Phone: [phone]   If any acute changes today or tomorrow; do not hesitate to go to emergency room.

## 2024-05-15 ENCOUNTER — Other Ambulatory Visit (INDEPENDENT_AMBULATORY_CARE_PROVIDER_SITE_OTHER): Payer: Self-pay | Admitting: Vascular Surgery

## 2024-05-15 ENCOUNTER — Ambulatory Visit (INDEPENDENT_AMBULATORY_CARE_PROVIDER_SITE_OTHER)

## 2024-05-15 ENCOUNTER — Telehealth: Payer: Self-pay

## 2024-05-15 DIAGNOSIS — R6889 Other general symptoms and signs: Secondary | ICD-10-CM

## 2024-05-15 LAB — COMPREHENSIVE METABOLIC PANEL WITH GFR
ALT: 32 U/L (ref 0–35)
AST: 38 U/L — ABNORMAL HIGH (ref 0–37)
Albumin: 3.7 g/dL (ref 3.5–5.2)
Alkaline Phosphatase: 148 U/L — ABNORMAL HIGH (ref 39–117)
BUN: 49 mg/dL — ABNORMAL HIGH (ref 6–23)
CO2: 32 meq/L (ref 19–32)
Calcium: 9.6 mg/dL (ref 8.4–10.5)
Chloride: 99 meq/L (ref 96–112)
Creatinine, Ser: 1.48 mg/dL — ABNORMAL HIGH (ref 0.40–1.20)
GFR: 29.12 mL/min — ABNORMAL LOW (ref >60.00)
Glucose, Bld: 93 mg/dL (ref 70–99)
Potassium: 4.1 meq/L (ref 3.5–5.1)
Sodium: 143 meq/L (ref 135–145)
Total Bilirubin: 1.1 mg/dL (ref 0.2–1.2)
Total Protein: 6.3 g/dL (ref 6.0–8.3)

## 2024-05-15 LAB — CBC WITH DIFFERENTIAL/PLATELET
Basophils Absolute: 0.1 K/uL (ref 0.0–0.1)
Basophils Relative: 1.2 % (ref 0.0–3.0)
Eosinophils Absolute: 0 K/uL (ref 0.0–0.7)
Eosinophils Relative: 0.8 % (ref 0.0–5.0)
HCT: 43.4 % (ref 36.0–46.0)
Hemoglobin: 14 g/dL (ref 12.0–15.0)
Lymphocytes Relative: 11.7 % — ABNORMAL LOW (ref 12.0–46.0)
Lymphs Abs: 0.6 K/uL — ABNORMAL LOW (ref 0.7–4.0)
MCHC: 32.3 g/dL (ref 30.0–36.0)
MCV: 92.1 fl (ref 78.0–100.0)
Monocytes Absolute: 0.3 K/uL (ref 0.1–1.0)
Monocytes Relative: 6.3 % (ref 3.0–12.0)
Neutro Abs: 4.4 K/uL (ref 1.4–7.7)
Neutrophils Relative %: 80 % — ABNORMAL HIGH (ref 43.0–77.0)
Platelets: 235 K/uL (ref 150.0–400.0)
RBC: 4.72 Mil/uL (ref 3.87–5.11)
RDW: 19.6 % — ABNORMAL HIGH (ref 11.5–15.5)
WBC: 5.5 K/uL (ref 4.0–10.5)

## 2024-05-15 LAB — TSH: TSH: 4.8 u[IU]/mL (ref 0.35–5.50)

## 2024-05-15 LAB — LIPID PANEL
Cholesterol: 203 mg/dL — ABNORMAL HIGH (ref 0–200)
HDL: 47.5 mg/dL (ref >39.00)
LDL Cholesterol: 138 mg/dL — ABNORMAL HIGH (ref 0–99)
NonHDL: 155.13
Total CHOL/HDL Ratio: 4
Triglycerides: 85 mg/dL (ref 0.0–149.0)
VLDL: 17 mg/dL (ref 0.0–40.0)

## 2024-05-15 LAB — RPR: RPR Ser Ql: NONREACTIVE

## 2024-05-15 LAB — HEMOGLOBIN A1C: Hgb A1c MFr Bld: 6.4 % (ref 4.6–6.5)

## 2024-05-15 LAB — B12 AND FOLATE PANEL
Folate: >22.9 ng/mL (ref >5.9)
Vitamin B-12: 1291 pg/mL — ABNORMAL HIGH (ref 211–911)

## 2024-05-15 NOTE — Telephone Encounter (Signed)
 NOTED

## 2024-05-15 NOTE — Telephone Encounter (Signed)
 Copied from CRM [id number]. Topic: Clinical - Medical Advice >> May 14, 2024  4:39 PM Taleah C wrote: Reason for CRM: pt's daughter called and stated that she took home a urine sample kit at her assisted living community.   She stated that [facility] was unable to accept the kit because they have to use their own specimen jar and use their own lab. She stated once they get the results they will fax it to the office.

## 2024-05-18 ENCOUNTER — Ambulatory Visit (INDEPENDENT_AMBULATORY_CARE_PROVIDER_SITE_OTHER): Payer: Self-pay | Admitting: Vascular Surgery

## 2024-05-18 ENCOUNTER — Encounter (INDEPENDENT_AMBULATORY_CARE_PROVIDER_SITE_OTHER)

## 2024-05-18 ENCOUNTER — Encounter (INDEPENDENT_AMBULATORY_CARE_PROVIDER_SITE_OTHER): Payer: Self-pay | Admitting: Vascular Surgery

## 2024-05-18 ENCOUNTER — Telehealth: Payer: Self-pay

## 2024-05-18 VITALS — BP 147/74 | HR 73 | Resp 18 | Ht 59.0 in

## 2024-05-18 DIAGNOSIS — I739 Peripheral vascular disease, unspecified: Secondary | ICD-10-CM | POA: Diagnosis not present

## 2024-05-18 DIAGNOSIS — K219 Gastro-esophageal reflux disease without esophagitis: Secondary | ICD-10-CM

## 2024-05-18 DIAGNOSIS — I10 Essential (primary) hypertension: Secondary | ICD-10-CM

## 2024-05-18 NOTE — Telephone Encounter (Signed)
 Copied from CRM [id number]. Topic: General - Other >> May 18, 2024  9:56 AM Emylou G wrote: Reason for CRM: Niels Corrigan daughter JANEANE [phone]  -- will drop off clipboard that she took today or tomorrow.SABRA Canon mom has an appt today 10/27 2pm ( [facility] ) - Please allow the imaging to be released to them.. including her 2:30pm w/[facility].

## 2024-05-19 ENCOUNTER — Ambulatory Visit: Admitting: Cardiology

## 2024-05-19 ENCOUNTER — Telehealth (INDEPENDENT_AMBULATORY_CARE_PROVIDER_SITE_OTHER): Payer: Self-pay

## 2024-05-19 NOTE — Telephone Encounter (Signed)
 LVM to call back to office to inform her of message below per Rollene   Call pt Ultrasound result from 10/24 is not available to me; this will be released by vascular She was seen by vascular yesterday

## 2024-05-19 NOTE — Telephone Encounter (Signed)
 Patient's daughter called back:   I spoke with the daughter and they want to wait until after 06/05/24, because according to the daughter she is not in pain, and has some important people coming in to see her and the daughter was in an accident, and you didn't make it seem as if it was that urgent anyway. I did kinda stress the importance without pushing. The daughter stated they are just getting up and will discuss it and call me back.

## 2024-05-19 NOTE — Assessment & Plan Note (Signed)
 Memory changes appear  nonacute.  Of note, patient is difficult to assess as she is hard of hearing.  Pending labs, repeat urine, CT head.  Strict ED precautions given and discussed symptoms of CVA with daughter.

## 2024-05-19 NOTE — H&P (View-Only) (Signed)
 Subjective:    Patient ID: SABA GOMM, female    DOB: 04-15-1923, 89 y.o.   MRN: [id number] Chief Complaint  Patient presents with   New Patient (Initial Visit)    *URGENT. np. ABI (scheduled on 05/15/24) + consult. ischemia of foot. lenon na. Per dgt concern of fingers and toes discoloration on both feet    Prim Morace is a 89 yo female who was sent to us  as an urgent consult from Dr. Rollene Huron office today.  Patient presents with abnormal ABIs of bilateral lower extremities with swelling and ulcerations to multiple toes on her left foot.  Patient's daughter is at the visit with the patient.  Patient resides in assisted living at this time.  Over the past couple of weeks patient's daughter states that her lower extremities have gotten more swollen and purple as they remain dependent.  She endorses that although the patient has dementia and memory issues she is having pain at rest.  Currently at this age she does not walk due to the swelling in her lower extremities.  She uses a wheelchair and schedules around using her feet rolling from place to place.  Patient is brought to us  today by her daughter in hopes of intervention to prevent any loss of limb at this time.  Patient did undergo bilateral lower extremity arterial vascular ultrasounds with ABIs.  Right ABI today is 0.20 with abnormal monophasic/biphasic waveforms Left ABI today is 0.39 with abnormal monophasic waveforms.  Patient is noted to have multiple toe ulcerations at the most distal end of her toes.    Review of Systems  Constitutional: Negative.   HENT:  Positive for hearing loss.   Cardiovascular:  Positive for leg swelling.       Patient has a history of atrial fibrillation treated with Eliquis  2.5 twice daily  Musculoskeletal:  Positive for myalgias.  Skin:  Positive for color change and wound.       Multiple ulcerations to her left toes at the most distal point.  Patient also has purple  hands with gouty calcific deposits in her joints.  Neurological:  Positive for weakness.       Patient has a history of bilateral lower extremity weakness using wheelchair regularly to ambulate  Psychiatric/Behavioral:  Positive for confusion.        Patient has a history of dementia and memory loss  All other systems reviewed and are negative.      Objective:   Physical Exam Constitutional:      Appearance: Normal appearance.  HENT:     Head: Normocephalic.  Eyes:     Pupils: Pupils are equal, round, and reactive to light.  Cardiovascular:     Rate and Rhythm: Normal rate. Rhythm irregular.     Pulses: Normal pulses.     Heart sounds: Normal heart sounds.     Comments: History of atrial fibrillation Pulmonary:     Effort: Pulmonary effort is normal.     Breath sounds: Normal breath sounds.  Abdominal:     General: Abdomen is flat. Bowel sounds are normal.     Palpations: Abdomen is soft.  Musculoskeletal:        General: Swelling and tenderness present.     Right lower leg: Edema present.     Left lower leg: Edema present.  Skin:    General: Skin is dry.     Capillary Refill: Capillary refill takes more than 3 seconds.     Coloration: Skin is  pale.     Findings: Erythema present.  Neurological:     General: No focal deficit present.     Mental Status: She is alert and oriented to person, place, and time. Mental status is at baseline.  Psychiatric:        Mood and Affect: Mood normal.        Behavior: Behavior normal.        Thought Content: Thought content normal.        Judgment: Judgment normal.     BP (!) 147/74   Pulse 73   Resp 18   Ht 4' 11 (1.499 m)   BMI 21.43 kg/m   Past Medical History:  Diagnosis Date   Arthritis    Breast cancer (HCC) 2013   left breast, radiation and lumpectomy   Cataract    Fatigue    GERD (gastroesophageal reflux disease)    Glaucoma    Heart murmur    Heart palpitations    Hiatal hernia    HOH (hard of hearing)     AIDS   Hyperlipidemia    Hypertension    Hypothyroidism    Left ankle injury    Old left ankle injury   Left bundle branch block    Neuropathy    Osteoporosis    Pacemaker    Palpitations    Personal history of radiation therapy 2013   left breast ca   Scoliosis    Shortness of breath dyspnea    Thyroid nodule    TIA (transient ischemic attack)     Social History   Socioeconomic History   Marital status: Widowed    Spouse name: Not on file   Number of children: 3   Years of education: Not on file   Highest education level: High school graduate  Occupational History   Not on file  Tobacco Use   Smoking status: Never   Smokeless tobacco: Never  Vaping Use   Vaping status: Never Used  Substance and Sexual Activity   Alcohol use: No   Drug use: Never    Comment: pain clinic: tramadol    Sexual activity: Not Currently  Other Topics Concern   Not on file  Social History Narrative   Not on file   Social Drivers of Health   Financial Resource Strain: Low Risk  (07/11/2021)   Overall Financial Resource Strain (CARDIA)    Difficulty of Paying Living Expenses: Not hard at all  Food Insecurity: No Food Insecurity (07/11/2021)   Hunger Vital Sign    Worried About Running Out of Food in the Last Year: Never true    Ran Out of Food in the Last Year: Never true  Transportation Needs: No Transportation Needs (07/11/2021)   PRAPARE - Administrator, Civil Service (Medical): No    Lack of Transportation (Non-Medical): No  Physical Activity: Inactive (07/11/2021)   Exercise Vital Sign    Days of Exercise per Week: 0 days    Minutes of Exercise per Session: 0 min  Stress: Stress Concern Present (07/11/2021)   [employer] of Occupational Health - Occupational Stress Questionnaire    Feeling of Stress : To some extent  Social Connections: Moderately Integrated (07/11/2021)   Social Connection and Isolation Panel    Frequency of Communication with Friends and  Family: More than three times a week    Frequency of Social Gatherings with Friends and Family: More than three times a week    Attends Religious Services: More than  4 times per year    Active Member of Clubs or Organizations: Yes    Attends Banker Meetings: More than 4 times per year    Marital Status: Widowed  Intimate Partner Violence: Not At Risk (07/11/2021)   Humiliation, Afraid, Rape, and Kick questionnaire    Fear of Current or Ex-Partner: No    Emotionally Abused: No    Physically Abused: No    Sexually Abused: No    Past Surgical History:  Procedure Laterality Date   ABDOMINAL HYSTERECTOMY     BREAST BIOPSY Left 2005   bengin   BREAST BIOPSY Left 2013   stereo bx, invasive tubular carcinoma and DCIS   BREAST LUMPECTOMY Left 02/21/2012   invasive tubular carcinoma and DCIS, clear margins, 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   BREAST SURGERY     LUMPECTOMY   CATARACT EXTRACTION W/PHACO Right 03/01/2015   Procedure: CATARACT EXTRACTION PHACO AND INTRAOCULAR LENS PLACEMENT (IOC);  Surgeon: Elsie Carmine, MD;  Location: [facility];  Service: Ophthalmology;  Laterality: Right;  US :1:03.1 AP:22.8 CDE:14.37  Fluid lot # [id number]   CATARACT EXTRACTION W/PHACO Left 03/22/2015   Procedure: CATARACT EXTRACTION PHACO AND INTRAOCULAR LENS PLACEMENT (IOC);  Surgeon: Elsie Carmine, MD;  Location: [facility];  Service: Ophthalmology;  Laterality: Left;  US : 01:01.0 AP%: 22.5% CDE: 13.75 Lot # [id number]   ESOPHAGOGASTRODUODENOSCOPY (EGD) WITH PROPOFOL  N/A 12/04/2020   Procedure: ESOPHAGOGASTRODUODENOSCOPY (EGD) WITH PROPOFOL ;  Surgeon: Unk Corinn Skiff, MD;  Location: [facility];  Service: Gastroenterology;  Laterality: N/A;   ESOPHAGOGASTRODUODENOSCOPY (EGD) WITH PROPOFOL  N/A 12/04/2020   Procedure: ESOPHAGOGASTRODUODENOSCOPY (EGD) WITH PROPOFOL ;  Surgeon: Unk Corinn Skiff, MD;  Location: [facility];  Service: Gastroenterology;  Laterality: N/A;   EYE SURGERY  09/2020 and 10/2020    MASTECTOMY PARTIAL / LUMPECTOMY     PPM GENERATOR CHANGEOUT N/A 02/10/2024   Procedure: PPM GENERATOR CHANGEOUT;  Surgeon: Cindie Ole DASEN, MD;  Location: [facility];  Service: Cardiovascular;  Laterality: N/A;   THYROIDECTOMY, PARTIAL      Family History  Problem Relation Age of Onset   Breast cancer Neg Hx     Allergies  Allergen Reactions   Codeine Other (See Comments)    Alt Ment Status (per notation in 2013)  Altered mental status    Other reaction(s): Other (See Comments)  Alt Ment Status (per notation in 2013) Altered mental status       Latest Ref Rng & Units 05/14/2024    3:00 PM 11/12/2023   12:05 PM 09/21/2021   11:53 PM  CBC  WBC 4.0 - 10.5 K/uL 5.5  9.4  7.6   Hemoglobin 12.0 - 15.0 g/dL 85.9  86.6  88.3   Hematocrit 36.0 - 46.0 % 43.4  42.4  36.6   Platelets 150.0 - 400.0 K/uL 235.0  275  304       CMP     Component Value Date/Time   NA 143 05/14/2024 1500   NA 143 11/12/2023 1205   NA 138 06/09/2013 0925   K 4.1 05/14/2024 1500   K 3.9 06/09/2013 0925   CL 99 05/14/2024 1500   CL 105 06/09/2013 0925   CO2 32 05/14/2024 1500   CO2 28 06/09/2013 0925   GLUCOSE 93 05/14/2024 1500   GLUCOSE 135 (H) 06/09/2013 0925   BUN 49 (H) 05/14/2024 1500   BUN 47 (H) 11/12/2023 1205   BUN 23 (H) 06/09/2013 0925   CREATININE 1.48 (H) 05/14/2024 1500   CREATININE 1.13 (H) 07/11/2021 1104  CALCIUM 9.6 05/14/2024 1500   CALCIUM 9.0 06/09/2013 0925   PROT 6.3 05/14/2024 1500   PROT 6.5 11/12/2023 1205   PROT 5.5 (L) 05/28/2013 0452   ALBUMIN 3.7 05/14/2024 1500   ALBUMIN 3.8 11/12/2023 1205   ALBUMIN 2.6 (L) 05/28/2013 0452   AST 38 (H) 05/14/2024 1500   AST 30 05/28/2013 0452   ALT 32 05/14/2024 1500   ALT 13 05/28/2013 0452   ALKPHOS 148 (H) 05/14/2024 1500   ALKPHOS 67 05/28/2013 0452   BILITOT 1.1 05/14/2024 1500   BILITOT 0.9 11/12/2023 1205   BILITOT 0.3 05/28/2013 0452   GFR 29.12 (L) 05/14/2024 1500   EGFR 34 (L) 11/12/2023 1205    GFRNONAA 32 (L) 09/21/2021 2353   GFRNONAA 32 (L) 11/28/2020 1128     No results found.     Assessment & Plan:   1. Claudication of both lower extremities (Primary)  Recommend:  The patient has evidence of severe atherosclerotic changes of both lower extremities associated with ulceration and tissue loss of the right toes/ foot.  This represents a limb threatening ischemia and places the patient at the risk for bilateral lower extremity limb loss.  Patient should undergo angiography of the Left lower extremity with the hope for intervention for limb salvage.  The risks and benefits as well as the alternative therapies was discussed in detail with the patient.  All questions were answered.  Patient agrees to proceed with Left angiography.  The patient presents to the office in consultation of atherosclerotic changes of the lower extremities and review of the noninvasive studies.   The patient's daughter notes that there has been a significant deterioration in the lower extremity symptoms.  The patient notes interval shortening of their claudication distance and development of mild rest pain symptoms. No new ulcers or wounds have occurred since the last visit.  There have been no significant changes to the patient's overall health care.  The patient denies amaurosis fugax or recent TIA symptoms. There are no recent neurological changes noted. There is no history of DVT, PE or superficial thrombophlebitis. The patient denies recent episodes of angina or shortness of breath.   ABI's Rt=.20 and Lt=.39 No previous ABI's Rt=0 and Lt=0) Duplex US  of the lower extremity arterial system shows Monophasic waveforms on the left and monophasic waveform at the PTA on the right with biphasic waveform at the [facility]  The patient will follow up with me in the office after the procedure.   2. Peripheral vascular insufficiency Recommend:  I have had a long discussion with the patient regarding swelling and  why it  causes symptoms.  Patient will begin wearing graduated compression on a daily basis a prescription was given. The patient will  wear the stockings first thing in the morning and removing them in the evening. The patient is instructed specifically not to sleep in the stockings.   In addition, behavioral modification will be initiated.  This will include frequent elevation, use of over the counter pain medications and exercise such as walking.  Consideration for a lymph pump will also be made based upon the effectiveness of conservative therapy.  This would help to improve the edema control and prevent sequela such as ulcers and infections   Patient should undergo duplex ultrasound of the venous system to ensure that DVT or reflux is not present.  The patient will follow-up with me after the ultrasound.   3. Essential hypertension Continue antihypertensive medications as already ordered, these medications have  been reviewed and there are no changes at this time.  4. Gastroesophageal reflux disease without esophagitis Continue PPI as already ordered, this medication has been reviewed and there are no changes at this time.  Avoidence of caffeine and alcohol  Moderate elevation of the head of the bed    Current Outpatient Medications on File Prior to Visit  Medication Sig Dispense Refill   acetaminophen  (TYLENOL ) 650 MG CR tablet Take 2 tablets (1,300 mg total) by mouth 2 (two) times daily as needed for pain.     ELIQUIS  2.5 MG TABS tablet TAKE 1 TABLET BY MOUTH TWICE DAILY 60 tablet 6   hydrOXYzine (ATARAX) 10 MG tablet Take 10 mg by mouth every 12 (twelve) hours as needed for anxiety.     hyoscyamine (LEVSIN) 0.125 MG tablet Take 0.125 mg by mouth every 8 (eight) hours as needed (bloating, GI upset).     Lactobacillus (AZO DUAL PROTECTION) CAPS Take 1 capsule by mouth daily. 30 capsule 11   losartan  (COZAAR ) 50 MG tablet Take 50 mg by mouth daily.     melatonin 5 MG TABS Take 5 mg  by mouth at bedtime as needed (insomnia).     metoprolol  succinate (TOPROL -XL) 50 MG 24 hr tablet TAKE 1 TABLET BY MOUTH DAILY 30 tablet 6   Multiple Vitamins-Minerals (MULTIVITAMIN WITH IRON-MINERALS) liquid Take by mouth daily.     Multiple Vitamins-Minerals (MULTIVITAMIN WITH MINERALS) tablet Take 1 tablet by mouth daily. Centrum silver 50+     NYAMYC powder Apply topically.     omeprazole  (PRILOSEC  OTC) 20 MG tablet Take 1 tablet (20 mg total) by mouth daily. 30 tablet 0   polyethylene glycol (MIRALAX / GLYCOLAX) 17 g packet Take 17 g by mouth at bedtime as needed for mild constipation, moderate constipation or severe constipation.     torsemide (DEMADEX) 20 MG tablet Take 20 mg by mouth daily.     traMADol  (ULTRAM ) 50 MG tablet Take 1 tablet (50 mg total) by mouth 2 (two) times daily. 50 mg at 10 am, 50 mg at 6pm 60 tablet 3   traMADol  (ULTRAM -ER) 200 MG 24 hr tablet Take 1 tablet (200 mg total) by mouth daily. At 9 pm, after dinner 90 tablet 0   zinc oxide (BALMEX) 11.3 % CREA cream Apply 1 Application topically 2 (two) times daily as needed.     No current facility-administered medications on file prior to visit.    There are no Patient Instructions on file for this visit. No follow-ups on file.   Gwendlyn JONELLE Shank, NP

## 2024-05-19 NOTE — Assessment & Plan Note (Signed)
 Discussed my grave concern for risk of limb loss, infection and gangrene based on discoloration and absence of bilateral pedal pulse.  Daughter and I agreed patient would need urgent vascular evaluation.  Daughter politely declines going to the emergency room due to stress it would cause a 89 year old mother.  This is certainly understandable.  We were able to call  vein and vascular to schedule ultrasound tomorrow ahead of follow-up with vascular.  Advised over the weekend if patient were to develop pain, further discoloration, to have a low threshold to presenting to the emergency room for urgent evaluation, salvage of limbs.

## 2024-05-19 NOTE — Progress Notes (Signed)
 Subjective:    Patient ID: Sue Knapp, female    DOB: 04-15-1923, 89 y.o.   MRN: [id number] Chief Complaint  Patient presents with   New Patient (Initial Visit)    *URGENT. np. ABI (scheduled on 05/15/24) + consult. ischemia of foot. lenon na. Per dgt concern of fingers and toes discoloration on both feet    Sue Knapp is a 89 yo female who was sent to us  as an urgent consult from Dr. Rollene Huron office today.  Patient presents with abnormal ABIs of bilateral lower extremities with swelling and ulcerations to multiple toes on her left foot.  Patient's daughter is at the visit with the patient.  Patient resides in assisted living at this time.  Over the past couple of weeks patient's daughter states that her lower extremities have gotten more swollen and purple as they remain dependent.  She endorses that although the patient has dementia and memory issues she is having pain at rest.  Currently at this age she does not walk due to the swelling in her lower extremities.  She uses a wheelchair and schedules around using her feet rolling from place to place.  Patient is brought to us  today by her daughter in hopes of intervention to prevent any loss of limb at this time.  Patient did undergo bilateral lower extremity arterial vascular ultrasounds with ABIs.  Right ABI today is 0.20 with abnormal monophasic/biphasic waveforms Left ABI today is 0.39 with abnormal monophasic waveforms.  Patient is noted to have multiple toe ulcerations at the most distal end of her toes.    Review of Systems  Constitutional: Negative.   HENT:  Positive for hearing loss.   Cardiovascular:  Positive for leg swelling.       Patient has a history of atrial fibrillation treated with Eliquis  2.5 twice daily  Musculoskeletal:  Positive for myalgias.  Skin:  Positive for color change and wound.       Multiple ulcerations to her left toes at the most distal point.  Patient also has purple  hands with gouty calcific deposits in her joints.  Neurological:  Positive for weakness.       Patient has a history of bilateral lower extremity weakness using wheelchair regularly to ambulate  Psychiatric/Behavioral:  Positive for confusion.        Patient has a history of dementia and memory loss  All other systems reviewed and are negative.      Objective:   Physical Exam Constitutional:      Appearance: Normal appearance.  HENT:     Head: Normocephalic.  Eyes:     Pupils: Pupils are equal, round, and reactive to light.  Cardiovascular:     Rate and Rhythm: Normal rate. Rhythm irregular.     Pulses: Normal pulses.     Heart sounds: Normal heart sounds.     Comments: History of atrial fibrillation Pulmonary:     Effort: Pulmonary effort is normal.     Breath sounds: Normal breath sounds.  Abdominal:     General: Abdomen is flat. Bowel sounds are normal.     Palpations: Abdomen is soft.  Musculoskeletal:        General: Swelling and tenderness present.     Right lower leg: Edema present.     Left lower leg: Edema present.  Skin:    General: Skin is dry.     Capillary Refill: Capillary refill takes more than 3 seconds.     Coloration: Skin is  pale.     Findings: Erythema present.  Neurological:     General: No focal deficit present.     Mental Status: She is alert and oriented to person, place, and time. Mental status is at baseline.  Psychiatric:        Mood and Affect: Mood normal.        Behavior: Behavior normal.        Thought Content: Thought content normal.        Judgment: Judgment normal.     BP (!) 147/74   Pulse 73   Resp 18   Ht 4' 11 (1.499 m)   BMI 21.43 kg/m   Past Medical History:  Diagnosis Date   Arthritis    Breast cancer (HCC) 2013   left breast, radiation and lumpectomy   Cataract    Fatigue    GERD (gastroesophageal reflux disease)    Glaucoma    Heart murmur    Heart palpitations    Hiatal hernia    HOH (hard of hearing)     AIDS   Hyperlipidemia    Hypertension    Hypothyroidism    Left ankle injury    Old left ankle injury   Left bundle branch block    Neuropathy    Osteoporosis    Pacemaker    Palpitations    Personal history of radiation therapy 2013   left breast ca   Scoliosis    Shortness of breath dyspnea    Thyroid nodule    TIA (transient ischemic attack)     Social History   Socioeconomic History   Marital status: Widowed    Spouse name: Not on file   Number of children: 3   Years of education: Not on file   Highest education level: High school graduate  Occupational History   Not on file  Tobacco Use   Smoking status: Never   Smokeless tobacco: Never  Vaping Use   Vaping status: Never Used  Substance and Sexual Activity   Alcohol use: No   Drug use: Never    Comment: pain clinic: tramadol    Sexual activity: Not Currently  Other Topics Concern   Not on file  Social History Narrative   Not on file   Social Drivers of Health   Financial Resource Strain: Low Risk  (07/11/2021)   Overall Financial Resource Strain (CARDIA)    Difficulty of Paying Living Expenses: Not hard at all  Food Insecurity: No Food Insecurity (07/11/2021)   Hunger Vital Sign    Worried About Running Out of Food in the Last Year: Never true    Ran Out of Food in the Last Year: Never true  Transportation Needs: No Transportation Needs (07/11/2021)   PRAPARE - Administrator, Civil Service (Medical): No    Lack of Transportation (Non-Medical): No  Physical Activity: Inactive (07/11/2021)   Exercise Vital Sign    Days of Exercise per Week: 0 days    Minutes of Exercise per Session: 0 min  Stress: Stress Concern Present (07/11/2021)   [employer] of Occupational Health - Occupational Stress Questionnaire    Feeling of Stress : To some extent  Social Connections: Moderately Integrated (07/11/2021)   Social Connection and Isolation Panel    Frequency of Communication with Friends and  Family: More than three times a week    Frequency of Social Gatherings with Friends and Family: More than three times a week    Attends Religious Services: More than  4 times per year    Active Member of Clubs or Organizations: Yes    Attends Banker Meetings: More than 4 times per year    Marital Status: Widowed  Intimate Partner Violence: Not At Risk (07/11/2021)   Humiliation, Afraid, Rape, and Kick questionnaire    Fear of Current or Ex-Partner: No    Emotionally Abused: No    Physically Abused: No    Sexually Abused: No    Past Surgical History:  Procedure Laterality Date   ABDOMINAL HYSTERECTOMY     BREAST BIOPSY Left 2005   bengin   BREAST BIOPSY Left 2013   stereo bx, invasive tubular carcinoma and DCIS   BREAST LUMPECTOMY Left 02/21/2012   invasive tubular carcinoma and DCIS, clear margins, 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   BREAST SURGERY     LUMPECTOMY   CATARACT EXTRACTION W/PHACO Right 03/01/2015   Procedure: CATARACT EXTRACTION PHACO AND INTRAOCULAR LENS PLACEMENT (IOC);  Surgeon: Elsie Carmine, MD;  Location: [facility];  Service: Ophthalmology;  Laterality: Right;  US :1:03.1 AP:22.8 CDE:14.37  Fluid lot # [id number]   CATARACT EXTRACTION W/PHACO Left 03/22/2015   Procedure: CATARACT EXTRACTION PHACO AND INTRAOCULAR LENS PLACEMENT (IOC);  Surgeon: Elsie Carmine, MD;  Location: [facility];  Service: Ophthalmology;  Laterality: Left;  US : 01:01.0 AP%: 22.5% CDE: 13.75 Lot # [id number]   ESOPHAGOGASTRODUODENOSCOPY (EGD) WITH PROPOFOL  N/A 12/04/2020   Procedure: ESOPHAGOGASTRODUODENOSCOPY (EGD) WITH PROPOFOL ;  Surgeon: Unk Corinn Skiff, MD;  Location: [facility];  Service: Gastroenterology;  Laterality: N/A;   ESOPHAGOGASTRODUODENOSCOPY (EGD) WITH PROPOFOL  N/A 12/04/2020   Procedure: ESOPHAGOGASTRODUODENOSCOPY (EGD) WITH PROPOFOL ;  Surgeon: Unk Corinn Skiff, MD;  Location: [facility];  Service: Gastroenterology;  Laterality: N/A;   EYE SURGERY  09/2020 and 10/2020    MASTECTOMY PARTIAL / LUMPECTOMY     PPM GENERATOR CHANGEOUT N/A 02/10/2024   Procedure: PPM GENERATOR CHANGEOUT;  Surgeon: Cindie Ole DASEN, MD;  Location: [facility];  Service: Cardiovascular;  Laterality: N/A;   THYROIDECTOMY, PARTIAL      Family History  Problem Relation Age of Onset   Breast cancer Neg Hx     Allergies  Allergen Reactions   Codeine Other (See Comments)    Alt Ment Status (per notation in 2013)  Altered mental status    Other reaction(s): Other (See Comments)  Alt Ment Status (per notation in 2013) Altered mental status       Latest Ref Rng & Units 05/14/2024    3:00 PM 11/12/2023   12:05 PM 09/21/2021   11:53 PM  CBC  WBC 4.0 - 10.5 K/uL 5.5  9.4  7.6   Hemoglobin 12.0 - 15.0 g/dL 85.9  86.6  88.3   Hematocrit 36.0 - 46.0 % 43.4  42.4  36.6   Platelets 150.0 - 400.0 K/uL 235.0  275  304       CMP     Component Value Date/Time   NA 143 05/14/2024 1500   NA 143 11/12/2023 1205   NA 138 06/09/2013 0925   K 4.1 05/14/2024 1500   K 3.9 06/09/2013 0925   CL 99 05/14/2024 1500   CL 105 06/09/2013 0925   CO2 32 05/14/2024 1500   CO2 28 06/09/2013 0925   GLUCOSE 93 05/14/2024 1500   GLUCOSE 135 (H) 06/09/2013 0925   BUN 49 (H) 05/14/2024 1500   BUN 47 (H) 11/12/2023 1205   BUN 23 (H) 06/09/2013 0925   CREATININE 1.48 (H) 05/14/2024 1500   CREATININE 1.13 (H) 07/11/2021 1104  CALCIUM 9.6 05/14/2024 1500   CALCIUM 9.0 06/09/2013 0925   PROT 6.3 05/14/2024 1500   PROT 6.5 11/12/2023 1205   PROT 5.5 (L) 05/28/2013 0452   ALBUMIN 3.7 05/14/2024 1500   ALBUMIN 3.8 11/12/2023 1205   ALBUMIN 2.6 (L) 05/28/2013 0452   AST 38 (H) 05/14/2024 1500   AST 30 05/28/2013 0452   ALT 32 05/14/2024 1500   ALT 13 05/28/2013 0452   ALKPHOS 148 (H) 05/14/2024 1500   ALKPHOS 67 05/28/2013 0452   BILITOT 1.1 05/14/2024 1500   BILITOT 0.9 11/12/2023 1205   BILITOT 0.3 05/28/2013 0452   GFR 29.12 (L) 05/14/2024 1500   EGFR 34 (L) 11/12/2023 1205    GFRNONAA 32 (L) 09/21/2021 2353   GFRNONAA 32 (L) 11/28/2020 1128     No results found.     Assessment & Plan:   1. Claudication of both lower extremities (Primary)  Recommend:  The patient has evidence of severe atherosclerotic changes of both lower extremities associated with ulceration and tissue loss of the right toes/ foot.  This represents a limb threatening ischemia and places the patient at the risk for bilateral lower extremity limb loss.  Patient should undergo angiography of the Left lower extremity with the hope for intervention for limb salvage.  The risks and benefits as well as the alternative therapies was discussed in detail with the patient.  All questions were answered.  Patient agrees to proceed with Left angiography.  The patient presents to the office in consultation of atherosclerotic changes of the lower extremities and review of the noninvasive studies.   The patient's daughter notes that there has been a significant deterioration in the lower extremity symptoms.  The patient notes interval shortening of their claudication distance and development of mild rest pain symptoms. No new ulcers or wounds have occurred since the last visit.  There have been no significant changes to the patient's overall health care.  The patient denies amaurosis fugax or recent TIA symptoms. There are no recent neurological changes noted. There is no history of DVT, PE or superficial thrombophlebitis. The patient denies recent episodes of angina or shortness of breath.   ABI's Rt=.20 and Lt=.39 No previous ABI's Rt=0 and Lt=0) Duplex US  of the lower extremity arterial system shows Monophasic waveforms on the left and monophasic waveform at the PTA on the right with biphasic waveform at the [facility]  The patient will follow up with me in the office after the procedure.   2. Peripheral vascular insufficiency Recommend:  I have had a long discussion with the patient regarding swelling and  why it  causes symptoms.  Patient will begin wearing graduated compression on a daily basis a prescription was given. The patient will  wear the stockings first thing in the morning and removing them in the evening. The patient is instructed specifically not to sleep in the stockings.   In addition, behavioral modification will be initiated.  This will include frequent elevation, use of over the counter pain medications and exercise such as walking.  Consideration for a lymph pump will also be made based upon the effectiveness of conservative therapy.  This would help to improve the edema control and prevent sequela such as ulcers and infections   Patient should undergo duplex ultrasound of the venous system to ensure that DVT or reflux is not present.  The patient will follow-up with me after the ultrasound.   3. Essential hypertension Continue antihypertensive medications as already ordered, these medications have  been reviewed and there are no changes at this time.  4. Gastroesophageal reflux disease without esophagitis Continue PPI as already ordered, this medication has been reviewed and there are no changes at this time.  Avoidence of caffeine and alcohol  Moderate elevation of the head of the bed    Current Outpatient Medications on File Prior to Visit  Medication Sig Dispense Refill   acetaminophen  (TYLENOL ) 650 MG CR tablet Take 2 tablets (1,300 mg total) by mouth 2 (two) times daily as needed for pain.     ELIQUIS  2.5 MG TABS tablet TAKE 1 TABLET BY MOUTH TWICE DAILY 60 tablet 6   hydrOXYzine (ATARAX) 10 MG tablet Take 10 mg by mouth every 12 (twelve) hours as needed for anxiety.     hyoscyamine (LEVSIN) 0.125 MG tablet Take 0.125 mg by mouth every 8 (eight) hours as needed (bloating, GI upset).     Lactobacillus (AZO DUAL PROTECTION) CAPS Take 1 capsule by mouth daily. 30 capsule 11   losartan  (COZAAR ) 50 MG tablet Take 50 mg by mouth daily.     melatonin 5 MG TABS Take 5 mg  by mouth at bedtime as needed (insomnia).     metoprolol  succinate (TOPROL -XL) 50 MG 24 hr tablet TAKE 1 TABLET BY MOUTH DAILY 30 tablet 6   Multiple Vitamins-Minerals (MULTIVITAMIN WITH IRON-MINERALS) liquid Take by mouth daily.     Multiple Vitamins-Minerals (MULTIVITAMIN WITH MINERALS) tablet Take 1 tablet by mouth daily. Centrum silver 50+     NYAMYC powder Apply topically.     omeprazole  (PRILOSEC  OTC) 20 MG tablet Take 1 tablet (20 mg total) by mouth daily. 30 tablet 0   polyethylene glycol (MIRALAX / GLYCOLAX) 17 g packet Take 17 g by mouth at bedtime as needed for mild constipation, moderate constipation or severe constipation.     torsemide (DEMADEX) 20 MG tablet Take 20 mg by mouth daily.     traMADol  (ULTRAM ) 50 MG tablet Take 1 tablet (50 mg total) by mouth 2 (two) times daily. 50 mg at 10 am, 50 mg at 6pm 60 tablet 3   traMADol  (ULTRAM -ER) 200 MG 24 hr tablet Take 1 tablet (200 mg total) by mouth daily. At 9 pm, after dinner 90 tablet 0   zinc oxide (BALMEX) 11.3 % CREA cream Apply 1 Application topically 2 (two) times daily as needed.     No current facility-administered medications on file prior to visit.    There are no Patient Instructions on file for this visit. No follow-ups on file.   Gwendlyn JONELLE Shank, NP

## 2024-05-19 NOTE — Assessment & Plan Note (Signed)
 Chronic, stable.  Patient is following with pain management whom is prescribing tramadol .  Will follow.

## 2024-05-19 NOTE — Telephone Encounter (Signed)
 What is status of status of ct head?

## 2024-05-19 NOTE — Telephone Encounter (Signed)
 I attempted to contact the patient to schedule her for a LLE angio with Dr. Marea for 05/20/24 with a 9:45 am arrival time to the [facility]. A message was left for a return call.

## 2024-05-20 ENCOUNTER — Ambulatory Visit: Admit: 2024-05-20 | Admitting: Vascular Surgery

## 2024-05-20 SURGERY — LOWER EXTREMITY ANGIOGRAPHY
Anesthesia: Moderate Sedation | Laterality: Left

## 2024-05-20 NOTE — Telephone Encounter (Signed)
 Patient's daughter called back, leaving a message that stated she wanted and answer to whether or not the patient needed to have her procedure ASAP. I returned to the call and explained that the NP stated to them at the appt the seriousness of the patient having her procedure. The patient's daughter stated again that she will need to have her procedure after 06/05/24. Patient has been scheduled for a LLE angio on 06/08/24 with a 9:00 am arrival time to the [facility]. Pre-procedure instructions will be mailed to the daughter's home as requested.

## 2024-05-21 ENCOUNTER — Telehealth: Payer: Self-pay

## 2024-05-21 ENCOUNTER — Telehealth: Payer: Self-pay | Admitting: *Deleted

## 2024-05-21 NOTE — Telephone Encounter (Signed)
 LVM for daughter Niels to inform her that we do not email, if she would like a copy of the labs I can print them off for her and leave upfront for her to pick up. Faxed labs to Nurse Holley Molt @ [phone]

## 2024-05-21 NOTE — Telephone Encounter (Unsigned)
 Copied from CRM [id number]. Topic: Medical Record Request - Records Request >> May 21, 2024 11:46 AM Ashley R wrote: Reason for CRM: Most recent lab results (10/23) request faxed to facility [phone]  to the ATTN Nurse Holley Gamer and also scanned emailed to daughter Niels Corrigan.([name])

## 2024-05-21 NOTE — Telephone Encounter (Signed)
 noted

## 2024-05-21 NOTE — Telephone Encounter (Signed)
 Copied from CRM #[id number]. Topic: Clinical - Medical Advice >> May 21, 2024 11:57 AM Ashley R wrote: Reason for CRM: Urinanalysis turned down at facility, not done because of two current antibiotics Armida NP, seen on Monday, feet up for an hour, toes and feet looked better. Visual examination of patient, angioplasty recommended for both legs. Scheduled for Nov 17

## 2024-05-21 NOTE — Telephone Encounter (Unsigned)
 Copied from CRM #[id number]. Topic: Clinical - Lab/Test Results >> May 21, 2024 11:51 AM Ashley R wrote: Most recent lab results (10/23) request faxed to facility [phone]  to the ATTN Nurse Holley Gamer and also scanned emailed to daughter Niels Corrigan.([name]) Callback [phone]

## 2024-05-21 NOTE — Telephone Encounter (Signed)
 See previous notes faxed labs on 05/21/24

## 2024-05-24 ENCOUNTER — Ambulatory Visit: Payer: Self-pay | Admitting: Family

## 2024-05-24 DIAGNOSIS — R899 Unspecified abnormal finding in specimens from other organs, systems and tissues: Secondary | ICD-10-CM

## 2024-05-25 NOTE — Telephone Encounter (Signed)
 Pt has appt will discuss at ov on 06/01/24

## 2024-05-25 NOTE — Telephone Encounter (Signed)
 Pt has appt scheduled on 06/01/24

## 2024-05-26 NOTE — Progress Notes (Signed)
 LVM for Sue Knapp to call the office back to discuss video visit with Rollene

## 2024-05-28 ENCOUNTER — Telehealth (INDEPENDENT_AMBULATORY_CARE_PROVIDER_SITE_OTHER): Payer: Self-pay

## 2024-05-28 NOTE — Telephone Encounter (Signed)
 I attempted to speak with the daughter to give her an updated arrival time for the procedure on 06/08/24 of 10:00 am. A message was left for a return call.

## 2024-05-29 ENCOUNTER — Telehealth: Payer: Self-pay | Admitting: Family
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
## 2024-06-01 ENCOUNTER — Ambulatory Visit: Admitting: Family

## 2024-06-01 ENCOUNTER — Telehealth (INDEPENDENT_AMBULATORY_CARE_PROVIDER_SITE_OTHER): Payer: Self-pay

## 2024-06-01 NOTE — Telephone Encounter (Signed)
 Patient's daughter called wanting to reschedule the patient to 06/15/24 with a 6:45 am arrival time to the [facility]. Pre-procedure instructions will be faxed to [facility] and per the daughter beecher Lango.

## 2024-06-02 ENCOUNTER — Telehealth (INDEPENDENT_AMBULATORY_CARE_PROVIDER_SITE_OTHER): Payer: Self-pay

## 2024-06-02 NOTE — Telephone Encounter (Signed)
 I spoke with patient's daughter, Niels, and let her know that we cannot do a telephone visit, but we can do a virtual visit.  Niels states patient is coming for a pace maker check on 06/10/2024, so she can see [name], FNP, on that day.  I scheduled appointment for patient to have an in-person visit with Rollene on 06/10/2024.  Niels states patient has a procedure for arteries and veins in both legs with Dr. Marea on 06/15/2024.  Niels states they will use twilight anesthesia, not general, and it's similar to an angioplasty.

## 2024-06-02 NOTE — Telephone Encounter (Signed)
 Noted

## 2024-06-02 NOTE — Telephone Encounter (Addendum)
 Patient's daughter called  today to reschedule the patient back to 06/08/24 at the [facility]. Patient's daughter called yesterday to move the patient to 06/15/24 with a 6:45 am arrival time to the [facility]. Patient has been moved back to 06/08/24. Patient was initially scheduled for the procedure on 05/20/24 and was rescheduled per the daughter to 06/08/24, then 06/15/24 and again today back to 06/08/24. Patient's daughter was completely briefed in the seriousness and quickness of the procedure being completed ASAP on 05/19/24. The pre-procedure information has been sent to [facility].

## 2024-06-02 NOTE — Telephone Encounter (Signed)
 noted

## 2024-06-08 ENCOUNTER — Encounter
Admission: RE | Disposition: A | Discharge status: Home / Self Care | Payer: Self-pay | Source: Home / Self Care | Attending: Vascular Surgery

## 2024-06-08 ENCOUNTER — Ambulatory Visit
Admission: RE | Admit: 2024-06-08 | Discharge: 2024-06-08 | Disposition: A | Discharge status: Home / Self Care | Attending: Vascular Surgery | Admitting: Vascular Surgery

## 2024-06-08 ENCOUNTER — Telehealth (INDEPENDENT_AMBULATORY_CARE_PROVIDER_SITE_OTHER): Payer: Self-pay | Admitting: Vascular Surgery

## 2024-06-08 ENCOUNTER — Encounter: Payer: Self-pay | Admitting: Vascular Surgery

## 2024-06-08 DIAGNOSIS — L97519 Non-pressure chronic ulcer of other part of right foot with unspecified severity: Secondary | ICD-10-CM | POA: Insufficient documentation

## 2024-06-08 DIAGNOSIS — I70235 Atherosclerosis of native arteries of right leg with ulceration of other part of foot: Secondary | ICD-10-CM | POA: Diagnosis not present

## 2024-06-08 DIAGNOSIS — F0390 Unspecified dementia without behavioral disturbance: Secondary | ICD-10-CM | POA: Insufficient documentation

## 2024-06-08 DIAGNOSIS — Z79899 Other long term (current) drug therapy: Secondary | ICD-10-CM | POA: Diagnosis not present

## 2024-06-08 DIAGNOSIS — K219 Gastro-esophageal reflux disease without esophagitis: Secondary | ICD-10-CM | POA: Insufficient documentation

## 2024-06-08 DIAGNOSIS — L97919 Non-pressure chronic ulcer of unspecified part of right lower leg with unspecified severity: Secondary | ICD-10-CM

## 2024-06-08 DIAGNOSIS — R609 Edema, unspecified: Secondary | ICD-10-CM | POA: Insufficient documentation

## 2024-06-08 DIAGNOSIS — I771 Stricture of artery: Secondary | ICD-10-CM

## 2024-06-08 DIAGNOSIS — L97529 Non-pressure chronic ulcer of other part of left foot with unspecified severity: Secondary | ICD-10-CM | POA: Diagnosis not present

## 2024-06-08 DIAGNOSIS — I70249 Atherosclerosis of native arteries of left leg with ulceration of unspecified site: Secondary | ICD-10-CM | POA: Diagnosis not present

## 2024-06-08 DIAGNOSIS — I70229 Atherosclerosis of native arteries of extremities with rest pain, unspecified extremity: Secondary | ICD-10-CM

## 2024-06-08 DIAGNOSIS — I4891 Unspecified atrial fibrillation: Secondary | ICD-10-CM | POA: Diagnosis not present

## 2024-06-08 DIAGNOSIS — I70245 Atherosclerosis of native arteries of left leg with ulceration of other part of foot: Secondary | ICD-10-CM | POA: Insufficient documentation

## 2024-06-08 DIAGNOSIS — I10 Essential (primary) hypertension: Secondary | ICD-10-CM | POA: Diagnosis not present

## 2024-06-08 DIAGNOSIS — I70239 Atherosclerosis of native arteries of right leg with ulceration of unspecified site: Secondary | ICD-10-CM | POA: Diagnosis not present

## 2024-06-08 DIAGNOSIS — L97929 Non-pressure chronic ulcer of unspecified part of left lower leg with unspecified severity: Secondary | ICD-10-CM | POA: Diagnosis not present

## 2024-06-08 DIAGNOSIS — Z7901 Long term (current) use of anticoagulants: Secondary | ICD-10-CM | POA: Diagnosis not present

## 2024-06-08 DIAGNOSIS — I708 Atherosclerosis of other arteries: Secondary | ICD-10-CM

## 2024-06-08 HISTORY — PX: LOWER EXTREMITY ANGIOGRAPHY: CATH118251

## 2024-06-08 LAB — CREATININE, SERUM
Creatinine, Ser: 1.55 mg/dL — ABNORMAL HIGH (ref 0.44–1.00)
GFR, Estimated: 30 mL/min — ABNORMAL LOW (ref >60)

## 2024-06-08 LAB — BUN: BUN: 52 mg/dL — ABNORMAL HIGH (ref 8–23)

## 2024-06-08 SURGERY — LOWER EXTREMITY ANGIOGRAPHY
Anesthesia: Moderate Sedation | Site: Leg Lower | Laterality: Left

## 2024-06-08 MED ORDER — HEPARIN (PORCINE) IN NACL 1000-0.9 UT/500ML-% IV SOLN
INTRAVENOUS | Status: DC | PRN
  Administered 2024-06-08: 1000 mL

## 2024-06-08 MED ORDER — FAMOTIDINE 20 MG PO TABS: 40.0000 mg | ORAL_TABLET | Freq: Once | ORAL | Status: DC | PRN

## 2024-06-08 MED ORDER — LIDOCAINE-EPINEPHRINE (PF) 1 %-1:200000 IJ SOLN
INTRAMUSCULAR | Status: DC | PRN
Start: 2024-06-08 — End: 2024-06-08
  Administered 2024-06-08: 10 mL

## 2024-06-08 MED ORDER — SODIUM CHLORIDE 0.9 % IV SOLN: INTRAVENOUS | Status: DC

## 2024-06-08 MED ORDER — CEFAZOLIN SODIUM-DEXTROSE 2-4 GM/100ML-% IV SOLN
2.0000 g | INTRAVENOUS | Status: AC
  Administered 2024-06-08: 2 g via INTRAVENOUS

## 2024-06-08 MED ORDER — MIDAZOLAM HCL 2 MG/2ML IJ SOLN
INTRAMUSCULAR | Status: AC
  Filled 2024-06-08: qty 2

## 2024-06-08 MED ORDER — IODIXANOL 320 MG/ML IV SOLN
INTRAVENOUS | Status: DC | PRN
  Administered 2024-06-08: 50 mL

## 2024-06-08 MED ORDER — MIDAZOLAM HCL (PF) 2 MG/2ML IJ SOLN
INTRAMUSCULAR | Status: DC | PRN
Start: 2024-06-08 — End: 2024-06-08
  Administered 2024-06-08 (×2): .5 mg via INTRAVENOUS

## 2024-06-08 MED ORDER — NITROGLYCERIN 1 MG/10 ML FOR IR/CATH LAB
INTRA_ARTERIAL | Status: DC | PRN
  Administered 2024-06-08: 300 ug

## 2024-06-08 MED ORDER — FENTANYL CITRATE (PF) 50 MCG/ML IJ SOSY
PREFILLED_SYRINGE | INTRAMUSCULAR | Status: AC
  Filled 2024-06-08: qty 1

## 2024-06-08 MED ORDER — DIPHENHYDRAMINE HCL 50 MG/ML IJ SOLN: 50.0000 mg | Freq: Once | INTRAMUSCULAR | Status: DC | PRN

## 2024-06-08 MED ORDER — ATORVASTATIN CALCIUM 10 MG PO TABS: 10.0000 mg | ORAL_TABLET | Freq: Every day | ORAL | 11 refills | Status: DC

## 2024-06-08 MED ORDER — MIDAZOLAM HCL 2 MG/ML PO SYRP: 8.0000 mg | ORAL_SOLUTION | Freq: Once | ORAL | Status: DC | PRN

## 2024-06-08 MED ORDER — TRAMADOL HCL 50 MG PO TABS
ORAL_TABLET | ORAL | Status: AC
  Filled 2024-06-08: qty 1

## 2024-06-08 MED ORDER — FENTANYL CITRATE (PF) 100 MCG/2ML IJ SOLN
INTRAMUSCULAR | Status: DC | PRN
  Administered 2024-06-08 (×2): 12.5 ug via INTRAVENOUS

## 2024-06-08 MED ORDER — HYDROMORPHONE HCL 1 MG/ML IJ SOLN: 1.0000 mg | Freq: Once | INTRAMUSCULAR | Status: DC | PRN

## 2024-06-08 MED ORDER — ONDANSETRON HCL 4 MG/2ML IJ SOLN: 4.0000 mg | Freq: Four times a day (QID) | INTRAMUSCULAR | Status: DC | PRN

## 2024-06-08 MED ORDER — HEPARIN SODIUM (PORCINE) 1000 UNIT/ML IJ SOLN
INTRAMUSCULAR | Status: AC
  Filled 2024-06-08: qty 10

## 2024-06-08 MED ORDER — CEFAZOLIN SODIUM-DEXTROSE 2-4 GM/100ML-% IV SOLN
INTRAVENOUS | Status: AC
  Filled 2024-06-08: qty 100

## 2024-06-08 MED ORDER — TRAMADOL HCL 50 MG PO TABS
50.0000 mg | ORAL_TABLET | Freq: Once | ORAL | Status: AC
  Administered 2024-06-08: 50 mg via ORAL

## 2024-06-08 MED ORDER — METHYLPREDNISOLONE SODIUM SUCC 125 MG IJ SOLR: 125.0000 mg | Freq: Once | INTRAMUSCULAR | Status: DC | PRN

## 2024-06-08 SURGICAL SUPPLY — 18 items
BALLOON LUTONIX 018 4X150X130 (BALLOONS) IMPLANT
BALLOON ULTRVRSE 2X300X150 (BALLOONS) IMPLANT
CATH ANGIO 5F PIGTAIL 65CM (CATHETERS) IMPLANT
CATH BEACON 5 .038 100 VERT TP (CATHETERS) IMPLANT
CATH CXI SUPP 2.6F 150 ANG (CATHETERS) IMPLANT
COVER PROBE ULTRASOUND 5X96 (MISCELLANEOUS) IMPLANT
DEVICE PRESTO INFLATION (MISCELLANEOUS) IMPLANT
DEVICE STARCLOSE SE CLOSURE (Vascular Products) IMPLANT
GLIDEWIRE ADV .035X260CM (WIRE) IMPLANT
KIT MICROPUNCTURE VSI 5F STIFF (SHEATH) IMPLANT
PACK ANGIOGRAPHY (CUSTOM PROCEDURE TRAY) ×1 IMPLANT
SHEATH ANL2 6FRX45 HC (SHEATH) IMPLANT
SHEATH BRITE TIP 5FRX11 (SHEATH) IMPLANT
SHEATH HIGHFLEX ANSEL 6FRX55 (SHEATH) IMPLANT
SYR MEDRAD MARK 7 150ML (SYRINGE) IMPLANT
TUBING CONTRAST HIGH PRESS 72 (TUBING) IMPLANT
WIRE COMMAND ST ANG 014 300 (WIRE) IMPLANT
WIRE J 3MM .035X145CM (WIRE) IMPLANT

## 2024-06-08 NOTE — Interval H&P Note (Signed)
 History and Physical Interval Note:  06/08/2024 10:40 AM  Sue Knapp  has presented today for surgery, with the diagnosis of LLE Angio   ASO w rest pain.  The various methods of treatment have been discussed with the patient and family. After consideration of risks, benefits and other options for treatment, the patient has consented to  Procedure(s): Lower Extremity Angiography (Left) as a surgical intervention.  The patient's history has been reviewed, patient examined, no change in status, stable for surgery.  I have reviewed the patient's chart and labs.  Questions were answered to the patient's satisfaction.     Samuele Storey

## 2024-06-08 NOTE — Telephone Encounter (Signed)
 LVM for pt daughter to call back to schedule post op appt for pt. Tried to call [place] x2 at [phone] (per female at [employer]) to schedule but no answer and voicemail box full.   Follow up with Brown, Fallon E, NP (Vascular Surgery) in 4 weeks (07/06/2024); with ABIs

## 2024-06-08 NOTE — Op Note (Signed)
 [facility]  Percutaneous Study/Intervention Procedural Note   Date of Surgery: 06/08/2024  Surgeon(s):Danne Scardina    Assistants:none  Pre-operative Diagnosis: PAD with ulceration bilateral lower extremities  Post-operative diagnosis:  Same  Procedure(s) Performed:             1.  Ultrasound guidance for vascular access right femoral artery             2.  Catheter placement into left SFA from right femoral approach             3.  Aortogram and selective left lower extremity angiogram             4.  Percutaneous transluminal angioplasty of left anterior tibial artery with 2 mm diameter by 30 cm length angioplasty balloon             5.  Percutaneous transluminal angioplasty of the left mid to distal SFA and above-knee popliteal artery with 4 mm diameter Lutonix drug-coated angioplasty balloon  6.  StarClose closure device right femoral artery  EBL: 5 cc  Contrast: 50 cc  Fluoro Time: 6.6 minutes  Moderate Conscious Sedation Time: approximately 47 minutes using 1 mg of Versed  and 25 mcg of Fentanyl               Indications:  Patient is a 89 y.o.female with ulceration and rest pain symptoms bilaterally. The patient has noninvasive study showing markedly reduced ABIs in the 0.2 range on the right and 0.3 range on the left. The patient is brought in for angiography for further evaluation and potential treatment.  Due to the limb threatening nature of the situation, angiogram was performed for attempted limb salvage. The patient is aware that if the procedure fails, amputation would be expected.  The patient also understands that even with successful revascularization, amputation may still be required due to the severity of the situation.  Risks and benefits are discussed and informed consent is obtained.   Procedure:  The patient was identified and appropriate procedural time out was performed.  The patient was then placed supine on the table and prepped and  draped in the usual sterile fashion. Moderate conscious sedation was administered during a face to face encounter with the patient throughout the procedure with my supervision of the RN administering medicines and monitoring the patient's vital signs, pulse oximetry, telemetry and mental status throughout from the start of the procedure until the patient was taken to the recovery room. Ultrasound was used to evaluate the right common femoral artery.  It was patent .  A digital ultrasound image was acquired.  A Seldinger needle was used to access the right common femoral artery under direct ultrasound guidance and a permanent image was performed.  A 0.035 J wire was advanced without resistance and a 5Fr sheath was placed.  Pigtail catheter was placed into the aorta and an AP aortogram was performed. This demonstrated normal renal arteries and normal aorta and iliac segments without significant stenosis although highly calcific and tortuous. I then crossed the aortic bifurcation and advanced to the left femoral head and then into the proximal left SFA. Selective left lower extremity angiogram was then performed. This demonstrated severe disease in the mid and distal SFA and was proximal popliteal artery with greater than 85% stenosis.  The mid and distal popliteal artery normalized.  There was severe tibial disease.  The anterior tibial artery had multiple areas of occlusion and was very small but was the only real  runoff to the foot.  The peroneal artery had a long segment occlusion with a very diseased distal reconstitution.  The posterior tibial artery was occluded without distal reconstitution.  Image quality was also fairly poor given the patient's continuous motion.  It was felt that it was in the patient's best interest to proceed with intervention after these images to avoid a second procedure and a larger amount of contrast and fluoroscopy based off of the findings from the initial angiogram. The patient was  systemically heparinized and a 6 French Ansell sheath was then placed over the [employer] wire. I then used a Kumpe catheter and the advantage wire to get across the SFA and popliteal disease and then exchanged for a command 14 wire and a CXI catheter.  Initially, I got into the peroneal artery where selective imaging was performed but this was a very poor target and did not fill the foot.  I then redirected the command 14 wire and CXI catheter into the anterior tibial artery.  I was able to surprisingly easily cross the occlusions and parked the wire in the foot.  I then proceeded with treatment.  A 2 mm diameter by 30 cm length angioplasty balloon was inflated from the distal anterior tibial artery up to its origin.  It was taken up to 10 atm for 1 minute.  Completion imaging showed very poor image quality due to continuous patient motion, but now the vessel appeared in line into the foot.  I then addressed the SFA and proximal popliteal artery with a 4 mm diameter by 15 cm length Lutonix drug-coated angioplasty balloon inflated twice up to about 8 to 10 atm.  Completion imaging showed marked improvement with only about a 20% residual stenosis. I elected to terminate the procedure. The sheath was removed and StarClose closure device was deployed in the right femoral artery with excellent hemostatic result. The patient was taken to the recovery room in stable condition having tolerated the procedure well.  Findings:               Aortogram:  This demonstrated normal renal arteries and normal aorta and iliac segments without significant stenosis although highly calcific and tortuous.             Left lower Extremity:  This demonstrated severe disease in the mid and distal SFA and was proximal popliteal artery with greater than 85% stenosis.  The mid and distal popliteal artery normalized.  There was severe tibial disease.  The anterior tibial artery had multiple areas of occlusion and was very small but  was the only real runoff to the foot.  The peroneal artery had a long segment occlusion with a very diseased distal reconstitution.  The posterior tibial artery was occluded without distal reconstitution   Disposition: Patient was taken to the recovery room in stable condition having tolerated the procedure well.  Complications: None  Selinda Gu 06/08/2024 12:45 PM   This note was created with Dragon Medical transcription system. Any errors in dictation are purely unintentional.

## 2024-06-09 ENCOUNTER — Telehealth (INDEPENDENT_AMBULATORY_CARE_PROVIDER_SITE_OTHER): Payer: Self-pay

## 2024-06-09 NOTE — Telephone Encounter (Signed)
 Patient's daughter, Sue Knapp, states patient had to go ahead and have her angioplasty yesterday with Dr. Selinda Gu.  Sue states it went well, but patient was without  ELIQUIS  2.5 MG TABS tablet for a couple of days before the procedure.  Sue states Dr. Gu was able to balloon a large vessel in patient's thigh (the worst leg) and two vessels in the calf of the same leg.  Sue states Dr. Gu would like to do the patient's other leg in 2-3 weeks, but her appointment has not been scheduled yet.  Sue states Dr. Fransisca office checked patient's kidney function the day of the procedure before proceeding with procedure.  Sue states Dr. Gu prescribed a cholesterol medication for patient, atorvastatin (LIPITOR) 10 MG tablet.  Sue states patient will not be able to make it to her appointment with [name], FNP, tomorrow.  We have rescheduled patient's appointment with Rollene for 06/17/2024.  Sue states they very much appreciate [name], FNP, and all her efforts and time to help patient.  Sue states she will be glad to send us  notes if we need them, just let her know.  I let Sue know that we should have any notes from Dr. Gu in our system since he is associated with [facility].

## 2024-06-09 NOTE — Telephone Encounter (Signed)
 Dr Gollan,    Patient has recently establish care with me this month.  She last saw you in May of this year for follow-up atrial fibrillation, htn.   She has follow up with Suzann 06/24/24  She is now following closely with Dr. Marea for PAD, s/p left lower angiography yesterday.  I was going to defer refilling eliquis  to you if okay ( patient is out)  I wasn't sure if needed to be held prior to planned right angiography per patient daughter in a couple of weeks.

## 2024-06-09 NOTE — Telephone Encounter (Signed)
 Patient's daughter left a message on my voicemail for Gwendlyn to return her call. She stated she didn't know why he called.

## 2024-06-10 ENCOUNTER — Ambulatory Visit: Admitting: Family

## 2024-06-10 ENCOUNTER — Ambulatory Visit: Admitting: Cardiology

## 2024-06-12 ENCOUNTER — Other Ambulatory Visit: Payer: Self-pay | Admitting: Cardiovascular Disease

## 2024-06-12 MED ORDER — APIXABAN 2.5 MG PO TABS: 2.5000 mg | ORAL_TABLET | Freq: Two times a day (BID) | ORAL | 6 refills | Status: DC

## 2024-06-13 NOTE — Telephone Encounter (Signed)
 noted

## 2024-06-15 ENCOUNTER — Telehealth (INDEPENDENT_AMBULATORY_CARE_PROVIDER_SITE_OTHER): Payer: Self-pay

## 2024-06-15 DIAGNOSIS — I70229 Atherosclerosis of native arteries of extremities with rest pain, unspecified extremity: Secondary | ICD-10-CM

## 2024-06-15 NOTE — Telephone Encounter (Signed)
 Holley MATSU, nurse at [facility] called at this time In reference to concern to patients L heel has a new necrotic area measuring 2cmX1cm, her toes have decreased redness and fluff at this time. Her concern was does patient need an appointment at this time, please advise.

## 2024-06-15 NOTE — Telephone Encounter (Signed)
 We just revascularized her leg several days ago, I would recommend she see a wound specialist to evaluate the wound as she likely has adequate perfusion for healing.  She does need a follow up studie with ABIs I have noticed

## 2024-06-16 ENCOUNTER — Ambulatory Visit: Admitting: Nurse Practitioner

## 2024-06-17 ENCOUNTER — Encounter: Payer: Self-pay | Admitting: Family

## 2024-06-17 ENCOUNTER — Ambulatory Visit: Admitting: Family

## 2024-06-17 ENCOUNTER — Other Ambulatory Visit (INDEPENDENT_AMBULATORY_CARE_PROVIDER_SITE_OTHER): Payer: Self-pay | Admitting: Vascular Surgery

## 2024-06-17 VITALS — BP 136/78 | HR 64 | Temp 97.4°F | Ht 59.0 in | Wt 106.1 lb

## 2024-06-17 DIAGNOSIS — R899 Unspecified abnormal finding in specimens from other organs, systems and tissues: Secondary | ICD-10-CM

## 2024-06-17 DIAGNOSIS — I739 Peripheral vascular disease, unspecified: Secondary | ICD-10-CM

## 2024-06-17 DIAGNOSIS — N189 Chronic kidney disease, unspecified: Secondary | ICD-10-CM

## 2024-06-17 DIAGNOSIS — L97329 Non-pressure chronic ulcer of left ankle with unspecified severity: Secondary | ICD-10-CM

## 2024-06-17 DIAGNOSIS — L97524 Non-pressure chronic ulcer of other part of left foot with necrosis of bone: Secondary | ICD-10-CM | POA: Insufficient documentation

## 2024-06-17 DIAGNOSIS — L97309 Non-pressure chronic ulcer of unspecified ankle with unspecified severity: Secondary | ICD-10-CM | POA: Insufficient documentation

## 2024-06-17 LAB — B12 AND FOLATE PANEL
Folate: >23.7 ng/mL (ref >5.9)
Vitamin B-12: >1500 pg/mL — ABNORMAL HIGH (ref 211–911)

## 2024-06-17 LAB — CBC WITH DIFFERENTIAL/PLATELET
Basophils Absolute: 0 K/uL (ref 0.0–0.1)
Basophils Relative: 0.2 % (ref 0.0–3.0)
Eosinophils Absolute: 0 K/uL (ref 0.0–0.7)
Eosinophils Relative: 0.3 % (ref 0.0–5.0)
HCT: 38.4 % (ref 36.0–46.0)
Hemoglobin: 12.6 g/dL (ref 12.0–15.0)
Lymphocytes Relative: 6.5 % — ABNORMAL LOW (ref 12.0–46.0)
Lymphs Abs: 0.6 K/uL — ABNORMAL LOW (ref 0.7–4.0)
MCHC: 32.7 g/dL (ref 30.0–36.0)
MCV: 92.3 fl (ref 78.0–100.0)
Monocytes Absolute: 0.8 K/uL (ref 0.1–1.0)
Monocytes Relative: 8.3 % (ref 3.0–12.0)
Neutro Abs: 7.7 K/uL (ref 1.4–7.7)
Neutrophils Relative %: 84.7 % — ABNORMAL HIGH (ref 43.0–77.0)
Platelets: 345 K/uL (ref 150.0–400.0)
RBC: 4.16 Mil/uL (ref 3.87–5.11)
RDW: 20.5 % — ABNORMAL HIGH (ref 11.5–15.5)
WBC: 9.1 K/uL (ref 4.0–10.5)

## 2024-06-17 LAB — GAMMA GT: GGT: 83 U/L — ABNORMAL HIGH (ref 7–51)

## 2024-06-17 NOTE — Assessment & Plan Note (Signed)
 Concerned with nature of poor healing wounds, notably left foot.  Known history of PAD.  Concerned that the walking boot, or perhaps prior shoe aggravated.   she has interim follow-up with vascular scheduled next week for repeat ABI.  Discussed risk of infection.  No gross symptoms to suggest infection today.  Patient daughter and I discussed chronic kidney disease and recent course of doxycycline.  We opted not to start an oral antibiotic in the absence of symptoms of infection.  We discussed wet-to-dry dressing, changing daily.  Wound referral has been placed.

## 2024-06-17 NOTE — Patient Instructions (Signed)
 Wet-to-dry dressing as discussed.  Referral to nephrology, wound care.  Please stay vigilant for concerns for infection to warrant immediate reevaluation.

## 2024-06-17 NOTE — Progress Notes (Signed)
 Assessment & Plan:  Ulcer of left ankle, unspecified ulcer stage (HCC) Assessment & Plan: Concerned with nature of poor healing wounds, notably left foot.  Known history of PAD.  Concerned that the walking boot, or perhaps prior shoe aggravated.   she has interim follow-up with vascular scheduled next week for repeat ABI.  Discussed risk of infection.  No gross symptoms to suggest infection today.  Patient daughter and I discussed chronic kidney disease and recent course of doxycycline.  We opted not to start an oral antibiotic in the absence of symptoms of infection.  We discussed wet-to-dry dressing, changing daily.  Wound referral has been placed.  Orders: -     AMB referral to wound care center  Chronic kidney disease, unspecified CKD stage -     Ambulatory referral to Nephrology  Abnormal laboratory test -     B12 and Folate Panel -     CBC with Differential/Platelet -     Gamma GT -     Alkaline phosphatase, isoenzymes     Return precautions given.   Risks, benefits, and alternatives of the medications and treatment plan prescribed today were discussed, and patient expressed understanding.   Education regarding symptom management and diagnosis given to patient on AVS either electronically or printed.  Return in about 1 month (around 07/17/2024).  Sue Northern, FNP  Subjective:    Patient ID: Sue Knapp Staff, female    DOB: 10-09-1922, 89 y.o.   MRN: [id number]  CC: Sue Knapp is a 89 y.o. female who presents today for follow up.   HPI: Accompanied by daughter who is primary historian.  Patient has bilateral hearing loss.  She is not wearing her hearing aids.     Discussed the use of AI scribe software for clinical note transcription with the patient, who gave verbal consent to proceed.  History of Present Illness   Sue Knapp is a 89 year old female with peripheral artery disease who presents for follow-up after angioplasty of the left leg. She  is accompanied by her daughter, Niels.  She underwent angioplasty on her left leg. She is concerned by wounds that developed after procedure. [facility] has been doing dressing changes and placed her in a walking boot .  Endorses Left leg swelling after angioplasty.   Denies sob. fever, chills, purulent discharge   She completed a course of doxycycline for cellulitis of her legs.  Compliant with trimethoprim 100 mg daily due to recurrence of UTIs  She is compliant with tramadol  200 mg extended release daily and, 50 mg twice nightly as managed by pain management   She has a history of chronic kidney disease with a GFR of 29. She is prediabetic and has elevated cholesterol levels. Her B12 levels are elevated.daughter states she is taking multivitamin supplement with b12. She is not currently followed by a nephrologist. Denies urinary frequency, dysuria.  Daughter and politely declines checking urine at this time.     Elevated B12  Relative neutrophils 80  Crt 1.48 ( prior 1.39)  LDL 138 A1c 6.4  awaiting follow-up ABI studies per telephone at 06/15/2024 Status post 06/08/2024 left angioplasty, severe tibial disease Follow-up vascular 06/24/2024  CT head not obtained from 05/14/24   Allergies: Codeine and Etodolac Current Outpatient Medications on File Prior to Visit  Medication Sig Dispense Refill   acetaminophen  (TYLENOL ) 650 MG CR tablet Take 2 tablets (1,300 mg total) by mouth 2 (two) times daily as needed for pain.  apixaban  (ELIQUIS ) 2.5 MG TABS tablet Take 1 tablet (2.5 mg total) by mouth 2 (two) times daily. 60 tablet 6   atorvastatin  (LIPITOR) 10 MG tablet Take 1 tablet (10 mg total) by mouth daily. 30 tablet 11   hydrOXYzine (ATARAX) 10 MG tablet Take 10 mg by mouth every 12 (twelve) hours as needed for anxiety.     hyoscyamine (LEVSIN) 0.125 MG tablet Take 0.125 mg by mouth every 8 (eight) hours as needed (bloating, GI upset).     Lactobacillus (AZO DUAL  PROTECTION) CAPS Take 1 capsule by mouth daily. 30 capsule 11   losartan  (COZAAR ) 50 MG tablet Take 50 mg by mouth daily.     melatonin 5 MG TABS Take 5 mg by mouth at bedtime as needed (insomnia).     metoprolol  succinate (TOPROL -XL) 50 MG 24 hr tablet TAKE 1 TABLET BY MOUTH DAILY 30 tablet 6   Multiple Vitamins-Minerals (MULTIVITAMIN WITH IRON-MINERALS) liquid Take by mouth daily.     Multiple Vitamins-Minerals (MULTIVITAMIN WITH MINERALS) tablet Take 1 tablet by mouth daily. Centrum silver 50+     NYAMYC powder Apply topically.     omeprazole  (PRILOSEC  OTC) 20 MG tablet Take 1 tablet (20 mg total) by mouth daily. 30 tablet 0   polyethylene glycol (MIRALAX / GLYCOLAX) 17 g packet Take 17 g by mouth at bedtime as needed for mild constipation, moderate constipation or severe constipation.     torsemide (DEMADEX) 20 MG tablet Take 20 mg by mouth daily.     traMADol  (ULTRAM ) 50 MG tablet Take 1 tablet (50 mg total) by mouth 2 (two) times daily. 50 mg at 10 am, 50 mg at 6pm 60 tablet 3   traMADol  (ULTRAM -ER) 200 MG 24 hr tablet Take 1 tablet (200 mg total) by mouth daily. At 9 pm, after dinner 90 tablet 0   trimethoprim (TRIMPEX) 100 MG tablet Take 100 mg by mouth 2 (two) times daily.     zinc oxide (BALMEX) 11.3 % CREA cream Apply 1 Application topically 2 (two) times daily as needed.     No current facility-administered medications on file prior to visit.    Review of Systems  Constitutional:  Negative for chills and fever.  Respiratory:  Negative for cough and shortness of breath.   Cardiovascular:  Positive for leg swelling. Negative for chest pain and palpitations.  Gastrointestinal:  Negative for nausea and vomiting.  Skin:  Positive for wound.  Neurological:  Negative for headaches.      Objective:    BP 136/78   Pulse 64   Temp (!) 97.4 F (36.3 C) (Oral)   Ht 4' 11 (1.499 m)   Wt 106 lb 1.6 oz (48.1 kg)   SpO2 95%   BMI 21.43 kg/m  BP Readings from Last 3 Encounters:   06/17/24 136/78  06/08/24 129/84  05/18/24 (!) 147/74   Wt Readings from Last 3 Encounters:  06/17/24 106 lb 1.6 oz (48.1 kg)  05/14/24 106 lb 1.6 oz (48.1 kg)  02/10/24 106 lb (48.1 kg)        Physical Exam Vitals reviewed.  Constitutional:      Appearance: She is well-developed.  Eyes:     Conjunctiva/sclera: Conjunctivae normal.  Cardiovascular:     Rate and Rhythm: Normal rate and regular rhythm.     Pulses: Normal pulses.     Heart sounds: Normal heart sounds.     Comments: Trace LLE nonpitting edema. No palpable cords or masses. No erythema or increased  warmth. No asymmetry in calf size when compared bilaterally  LE warm   Pulmonary:     Effort: Pulmonary effort is normal.     Breath sounds: Normal breath sounds. No wheezing, rhonchi or rales.  Musculoskeletal:     Right lower leg: No edema.     Left lower leg: Edema present.  Skin:    General: Skin is warm and dry.     Comments: RLE  Two 2 x 3 cm skin lacerations present LLE Right great toe 1 cm ulcer, left medial ankle ulcer 2 cm.  No purulent discharge, foul order.  Slough is present  Neurological:     Mental Status: She is alert.  Psychiatric:        Speech: Speech normal.        Behavior: Behavior normal.        Thought Content: Thought content normal.

## 2024-06-20 LAB — ALKALINE PHOSPHATASE, ISOENZYMES
Alkaline Phosphatase: 187 IU/L — ABNORMAL HIGH (ref 48–129)
BONE FRACTION: 31 % (ref 14–68)
INTESTINAL FRAC.: 10 % (ref 0–18)
LIVER FRACTION: 59 % (ref 18–85)

## 2024-06-23 NOTE — Progress Notes (Unsigned)
 Electrophysiology Clinic Note    Date:  06/24/2024  Patient ID:  Sue, Knapp 1922/09/27, MRN [id number] PCP:  Dineen Rollene MATSU, FNP  Cardiologist:  None  Electrophysiologist:  OLE ONEIDA HOLTS, MD  Electrophysiology APP:  Addilynn Mowrer, NP    Discussed the use of AI scribe software for clinical note transcription with the patient, who gave verbal consent to proceed.   Patient Profile    Chief Complaint: PPM gen change  History of Present Illness: Sue Knapp is a 89 y.o. female with PMH notable for perm AFib, CHB s/p PPM, LBBB, HTN, HLD, PAD, hypothyroid TIA, breast Ca, ; seen today for OLE ONEIDA HOLTS, MD for routine electrophysiology follow-up s/p Pacemaker gen change.  Her daughter joins for visit today and provides all HPI. She says that patient has been having waxing mental status for about the past 2 months. Typically is conversant but has days where she appears to sleep most of the day - and it is one of those days today.  No chest pain, chest pressure, palpitations that daughter is aware of. Patient continues to take eliquis  BID, no bleeding concerns other than intermittent scabs on hands/forearms.      Arrhythmia/Device History MDT dual chamber PPM, imp 2014 - gen change 01/2024    ROS:  Please see the history of present illness. All other systems are reviewed and otherwise negative.    Physical Exam    VS:  BP (!) 100/58 (BP Location: Left Arm, Patient Position: Sitting, Cuff Size: Normal)   Pulse 60   Ht 4' 11 (1.499 m)   Wt 98 lb (44.5 kg)   SpO2 98%   BMI 19.79 kg/m  BMI: Body mass index is 19.79 kg/m.           Wt Readings from Last 3 Encounters:  06/24/24 98 lb (44.5 kg)  06/17/24 106 lb 1.6 oz (48.1 kg)  05/14/24 106 lb 1.6 oz (48.1 kg)     GEN- The patient is well appearing, alert and oriented x 3 today.   Lungs- Clear to ausculation bilaterally, normal work of breathing.  Heart- Regular rate and rhythm, no  murmurs, rubs or gallops Extremities- Trace peripheral edema, bandages in place Skin-  device pocket well-healed, no tethering   Device interrogation done today and reviewed by myself:  Battery 12.7 years Lead thresholds, impedence, sensing stable  Dependent down to VVI 40 Single, brief NSVT episode No changes made today   Studies Reviewed   Previous EP, cardiology notes.    EKG is ordered. Personal review of EKG from today shows:    EKG Interpretation Date/Time:  Wednesday June 24 2024 11:33:07 EST Ventricular Rate:  60 PR Interval:    QRS Duration:  146 QT Interval:  488 QTC Calculation: 488 R Axis:   -73  Text Interpretation: Ventricular-paced rhythm Confirmed by Aydon Swamy [phone]) on 06/24/2024 12:08:43 PM     TTE, 12/02/2020  1. Left ventricular ejection fraction, by estimation, is 55 to 60%. The left ventricle has normal function. The left ventricle has no regional wall motion abnormalities. Left ventricular diastolic parameters were normal.   2. Right ventricular systolic function is normal. The right ventricular size is normal.   3. Left atrial size was moderately dilated.   4. Right atrial size was mildly dilated.   5. The mitral valve is normal in structure. Moderate mitral valve regurgitation.   6. The aortic valve is normal in structure. Aortic valve regurgitation is  mild.    Assessment and Plan     #) CHB s/p PPM  S/p recent gen change Device functioning well, see paceart for details Programmed VVI for perm afib Dependent down to VVI 40 today, VP 97.6%   #) perm afib #) Hypercoag d/t perm afib Ventricular rates well-controlled CHA2DS2-VASc Score = at least 5 [CHF History: 0, HTN History: 1, Diabetes History: 0, Stroke History: 0, Vascular Disease History: 1, Age Score: 2, Gender Score: 1].  Therefore, the patient's annual risk of stroke is 7.2 %.    Stroke ppx - 2.5mg  eliquis  BID, appropriately dosed No bleeding concerns Continue 50mg  toprol   daily   #) NSVT Very brief 4 second episode on PPM Asymptomatic of episode Continue to monitor via PPM Continue toprol  as above         Current medicines are reviewed at length with the patient today.   The patient does not have concerns regarding her medicines.  The following changes were made today:  none  Labs/ tests ordered today include:  Orders Placed This Encounter  Procedures   EKG 12-Lead     Disposition: Follow up with Dr. Kennyth or EP APP 2 years , sooner if needed Continue remote monitoring   Signed, Chantal Needle, NP  06/24/24  12:22 PM  Electrophysiology [facility]

## 2024-06-24 ENCOUNTER — Telehealth: Payer: Self-pay | Admitting: Family

## 2024-06-24 ENCOUNTER — Ambulatory Visit: Attending: Cardiology | Admitting: Cardiology

## 2024-06-24 ENCOUNTER — Ambulatory Visit (INDEPENDENT_AMBULATORY_CARE_PROVIDER_SITE_OTHER): Admitting: Nurse Practitioner

## 2024-06-24 ENCOUNTER — Ambulatory Visit (INDEPENDENT_AMBULATORY_CARE_PROVIDER_SITE_OTHER)

## 2024-06-24 ENCOUNTER — Encounter: Payer: Self-pay | Admitting: Cardiology

## 2024-06-24 VITALS — BP 100/58 | HR 60 | Ht 59.0 in | Wt 98.0 lb

## 2024-06-24 VITALS — BP 126/65 | HR 61 | Resp 16

## 2024-06-24 DIAGNOSIS — L02612 Cutaneous abscess of left foot: Secondary | ICD-10-CM

## 2024-06-24 DIAGNOSIS — I4891 Unspecified atrial fibrillation: Secondary | ICD-10-CM

## 2024-06-24 DIAGNOSIS — I739 Peripheral vascular disease, unspecified: Secondary | ICD-10-CM | POA: Diagnosis not present

## 2024-06-24 DIAGNOSIS — Z9889 Other specified postprocedural states: Secondary | ICD-10-CM | POA: Diagnosis not present

## 2024-06-24 DIAGNOSIS — I495 Sick sinus syndrome: Secondary | ICD-10-CM | POA: Diagnosis not present

## 2024-06-24 DIAGNOSIS — Z950 Presence of cardiac pacemaker: Secondary | ICD-10-CM

## 2024-06-24 DIAGNOSIS — D6869 Other thrombophilia: Secondary | ICD-10-CM | POA: Diagnosis not present

## 2024-06-24 DIAGNOSIS — L03032 Cellulitis of left toe: Secondary | ICD-10-CM | POA: Diagnosis not present

## 2024-06-24 DIAGNOSIS — I7025 Atherosclerosis of native arteries of other extremities with ulceration: Secondary | ICD-10-CM | POA: Diagnosis not present

## 2024-06-24 LAB — CUP PACEART INCLINIC DEVICE CHECK
Date Time Interrogation Session: 20251203123916
Implantable Lead Connection Status: 753985
Implantable Lead Connection Status: 753985
Implantable Lead Implant Date: 20141103
Implantable Lead Implant Date: 20141103
Implantable Lead Location: 753859
Implantable Lead Location: 753860
Implantable Lead Model: 5092
Implantable Lead Model: 5592
Implantable Pulse Generator Implant Date: 20250721

## 2024-06-24 LAB — VAS US ABI WITH/WO TBI
Left ABI: 1.1
Right ABI: >1

## 2024-06-24 NOTE — Patient Instructions (Signed)
 Medication Instructions:  Your physician recommends that you continue on your current medications as directed. Please refer to the Current Medication list given to you today.    *If you need a refill on your cardiac medications before your next appointment, please call your pharmacy*  Lab Work: No labs ordered today    Testing/Procedures: No test ordered today   Follow-Up: At [facility], you and your health needs are our priority.  As part of our continuing mission to provide you with exceptional heart care, our providers are all part of one team.  This team includes your primary Cardiologist (physician) and Advanced Practice Providers or APPs (Physician Assistants and Nurse Practitioners) who all work together to provide you with the care you need, when you need it.  Your next appointment:   2 year(s)  Provider:   Suzann Riddle, NP or Dr. Kennyth

## 2024-06-24 NOTE — Telephone Encounter (Signed)
 Call wound center Are there any earlier appointments available? Appointment is scheduled for January

## 2024-06-24 NOTE — Patient Instructions (Signed)
  VISIT SUMMARY: During today's visit, we discussed the multiple leg wounds you are experiencing and your concerns about wound care. We reviewed your recent revascularization procedure on your left leg and the new wounds that have developed on your heel. We also addressed your chronic ulcers, peripheral arterial disease, and the planned revascularization for your right leg.  YOUR PLAN: -CHRONIC LEFT FOOT AND HEEL ULCERS DUE TO PERIPHERAL ARTERIAL DISEASE: You have chronic ulcers on your left foot and heel, which are worsened by poor blood flow. We recommend using Santal ointment, which helps clean the wounds, and changing your bandaging technique to use non-stick dressings like Coban or Curlex. We are also working to get you an earlier appointment at the wound care center.  -PERIPHERAL ARTERIAL DISEASE OF BILATERAL LOWER EXTREMITIES: Peripheral arterial disease means that the blood flow to your legs is reduced. Recent tests show improved blood flow in your left leg. We will continue to monitor your condition and have scheduled another blood flow test in four weeks.  -PLANNED RIGHT LEG REVASCULARIZATION FOR ARTERIAL INSUFFICIENCY AND ULCERS: We are planning a procedure to improve blood flow in your right leg, which has ulcers and poor circulation. This procedure was delayed due to a recent accident, but we are working to reschedule it soon.  INSTRUCTIONS: Please apply Santal ointment to your wounds, cover with gauze, and wrap with Coban or Curlex and an Ace wrap as instructed. Attend your scheduled arterial duplex in four weeks to monitor blood flow. We will notify you once we have rescheduled your right leg revascularization procedure. Continue to monitor your wounds and contact us  if you notice any changes or have concerns.   Please Avoid use of sticky bandages on or near wound sites, except for area of Right leg                    Contains text generated by Abridge.                                  Contains text generated by Abridge.

## 2024-06-24 NOTE — Telephone Encounter (Signed)
 Called [facility] wound care they do not have any earlier appts due to holiday and a provider is out right now, but they did place her name at top of cancellation list

## 2024-06-25 ENCOUNTER — Ambulatory Visit: Attending: Nurse Practitioner | Admitting: Nurse Practitioner

## 2024-06-25 ENCOUNTER — Telehealth: Payer: Self-pay

## 2024-06-25 ENCOUNTER — Telehealth (INDEPENDENT_AMBULATORY_CARE_PROVIDER_SITE_OTHER): Payer: Self-pay

## 2024-06-25 ENCOUNTER — Telehealth: Payer: Self-pay | Admitting: *Deleted

## 2024-06-25 DIAGNOSIS — M1711 Unilateral primary osteoarthritis, right knee: Secondary | ICD-10-CM

## 2024-06-25 DIAGNOSIS — Z79899 Other long term (current) drug therapy: Secondary | ICD-10-CM

## 2024-06-25 DIAGNOSIS — M1712 Unilateral primary osteoarthritis, left knee: Secondary | ICD-10-CM

## 2024-06-25 DIAGNOSIS — G894 Chronic pain syndrome: Secondary | ICD-10-CM

## 2024-06-25 DIAGNOSIS — M19111 Post-traumatic osteoarthritis, right shoulder: Secondary | ICD-10-CM

## 2024-06-25 DIAGNOSIS — M170 Bilateral primary osteoarthritis of knee: Secondary | ICD-10-CM

## 2024-06-25 MED ORDER — TRAMADOL HCL ER 100 MG PO TB24: 100.0000 mg | ORAL_TABLET | Freq: Every day | ORAL | 2 refills | Status: DC

## 2024-06-25 MED ORDER — TRAMADOL HCL ER 200 MG PO TB24: 200.0000 mg | ORAL_TABLET | Freq: Every day | ORAL | 0 refills | Status: DC

## 2024-06-25 MED ORDER — TRAMADOL HCL 50 MG PO TABS: 50.0000 mg | ORAL_TABLET | Freq: Two times a day (BID) | ORAL | 3 refills | Status: DC

## 2024-06-25 NOTE — Telephone Encounter (Signed)
 The daughter has concerns because lately her mother seems very sleepy all the time and has lost a lot of weight in the past 2 months. She is wondering if maybe all the tramadol  she is taking is causing this. She would like to discuss this with a nurse as soon as possible. She only weighs 89 pounds now. She also having to be assisted in getting up and down in the nursing home.

## 2024-06-25 NOTE — Progress Notes (Addendum)
 PROVIDER NOTE: Interpretation of information contained herein should be left to medically-trained personnel. Specific patient instructions are provided elsewhere under Patient Instructions section of medical record. This document was created in part using AI and STT-dictation technology, any transcriptional errors that may result from this process are unintentional.  Patient: Sue Knapp  Service: E/M   PCP: Dineen Rollene MATSU, FNP  DOB: [birth year]  DOS: 06/25/2024  Provider: Emmy MARLA Blanch, Sue Knapp  MRN: [id number]  Delivery: Virtual Visit  Specialty: Interventional Pain Management  Type: Established Patient  Setting: Ambulatory outpatient facility  Specialty designation: 09  Referring Prov.: Lenon Layman ORN, MD  Location: Remote location       Virtual Encounter - Pain Management PROVIDER NOTE: Information contained herein reflects review and annotations entered in association with encounter. Interpretation of such information and data should be left to medically-trained personnel. Information provided to patient can be located elsewhere in the medical record under Patient Instructions. Document created using STT-dictation technology, any transcriptional errors that may result from process are unintentional.    Contact & Pharmacy Preferred: [phone] Home: [phone] (home) Mobile: [phone] (mobile) E-mail: [email or web address]  [facility] - [place], [place] - [address] [place] [address] Phone: [phone] Fax: [phone]   Pre-screening  [name] offered in-person vs virtual encounter. She indicated preferring virtual for this encounter.   Reason COVID-19*  Social distancing based on CDC and AMA recommendations.   I contacted Sue Knapp on 06/25/2024 via telephone.      I clearly identified myself as Emmy MARLA Blanch, Sue Knapp. I verified that I was speaking with the correct person using two identifiers  (Name: Sue Knapp, and date of birth: 88-Feb-1924).  Consent I sought verbal advanced consent from Sue Knapp for virtual visit interactions. I informed Sue Knapp of possible security and privacy concerns, risks, and limitations associated with providing not-in-person medical evaluation and management services. I also informed Sue Knapp of the availability of in-person appointments. Finally, I informed her that there would be a charge for the virtual visit and that she could be  personally, fully or partially, financially responsible for it. Sue Knapp expressed understanding and agreed to proceed.   Historic Elements   Sue Knapp is a 89 y.o. year old, female patient evaluated today after our last contact on 03/30/2024. Sue Knapp  has a past medical history of Arthritis, Breast cancer (HCC) (2013), Cataract, Fatigue, GERD (gastroesophageal reflux disease), Glaucoma, Heart murmur, Heart palpitations, Hiatal hernia, HOH (hard of hearing), Hyperlipidemia, Hypertension, Hypothyroidism, Left ankle injury, Left bundle branch block, Neuropathy, Osteoporosis, Pacemaker, Palpitations, Personal history of radiation therapy (2013), Scoliosis, Shortness of breath dyspnea, Thyroid  nodule, and TIA (transient ischemic attack). She also  has a past surgical history that includes Thyroidectomy, partial; Abdominal hysterectomy; Breast surgery; Cataract extraction w/PHACO (Right, 03/01/2015); Cataract extraction w/PHACO (Left, 03/22/2015); Mastectomy partial / lumpectomy; Breast lumpectomy (Left, 02/21/2012); Breast biopsy (Left, 2005); Breast biopsy (Left, 2013); Eye surgery (09/2020 and 10/2020); Esophagogastroduodenoscopy (egd) with propofol  (N/A, 12/04/2020); Esophagogastroduodenoscopy (egd) with propofol  (N/A, 12/04/2020); PPM GENERATOR CHANGEOUT (N/A, 02/10/2024); and Lower Extremity Angiography (Left, 06/08/2024). Sue Knapp has a current medication list which includes the following  prescription(s): acetaminophen , apixaban , atorvastatin , hydroxyzine, hyoscyamine, azo dual protection, losartan , melatonin, metoprolol  succinate, multivitamin with iron-minerals, multivitamin with minerals, nyamyc, omeprazole , polyethylene glycol, torsemide, tramadol , tramadol , trimethoprim, and zinc oxide. She  reports that she has never smoked. She has never used smokeless tobacco. She reports that she does  not drink alcohol and does not use drugs. Sue Knapp is allergic to codeine and etodolac.  BMI: Estimated body mass index is 19.79 kg/m as calculated from the following:   Height as of 06/24/24: 4' 11 (1.499 m).   Weight as of 06/24/24: 98 lb (44.5 kg). Last encounter: 03/30/2024. Last procedure: Visit date not found.  HPI  Today, she is being contacted for medication management. No change in medical history since last visit.  Patient's pain is at baseline.  Patient continues multimodal pain regimen as prescribed.  States that it provides pain relief and improvement in functional status.  Today I contacted Sue Knapp; however, she appeared unable to communicate effectively during the virtual encounter. I then contact and spoke to her daughter, who expressed concerns about her mother's fluctuating mental status health since she transferred to skilled nursing facility. She reported that her mother has been sleeping most of the day past one or two days. She is worried this may be relate to tramadol  side effects, however, she is on other medications as well too. After discussing the situation, her daughter and I agreed to reduce the dose of tramadol  ER 200 mg to 100 mg.   Pharmacotherapy Assessment  Tramadol  100 mg ER daily at 8:30 PM. Tramadol  50 mg IR at 10 AM and 6 PM. Monitoring: [place] PMP: PDMP not reviewed this encounter.       Pharmacotherapy: No side-effects or adverse reactions reported. Compliance: No problems identified. Effectiveness: Clinically acceptable. Plan: Refer to POC.  UDS:  No results found for: SUMMARY No results found for: CBDTHCR, D8THCCBX, D9THCCBX  Laboratory Chemistry Profile   Renal Lab Results  Component Value Date   BUN 52 (H) 06/08/2024   CREATININE 1.55 (H) 06/08/2024   BCR 34 (H) 11/12/2023   GFR 29.12 (L) 05/14/2024   GFRAA 37 (L) 11/28/2020   GFRNONAA 30 (L) 06/08/2024    Hepatic Lab Results  Component Value Date   AST 38 (H) 05/14/2024   ALT 32 05/14/2024   ALBUMIN 3.7 05/14/2024   ALKPHOS 187 (H) 06/17/2024   LIPASE 36 09/14/2019    Electrolytes Lab Results  Component Value Date   NA 143 05/14/2024   K 4.1 05/14/2024   CL 99 05/14/2024   CALCIUM  9.6 05/14/2024   MG 1.9 12/04/2020    Bone No results found for: VD25OH, VD125OH2TOT, CI6874NY7, CI7874NY7, 25OHVITD1, 25OHVITD2, 25OHVITD3, TESTOFREE, TESTOSTERONE  Inflammation (CRP: Acute Phase) (ESR: Chronic Phase) No results found for: CRP, ESRSEDRATE, LATICACIDVEN       Note: Above Lab results reviewed.  Imaging  VAS US  ABI WITH/WO TBI  LOWER EXTREMITY DOPPLER STUDY  Patient Name:  Sue Knapp  Date of Exam:   06/24/2024 Medical Rec #: [id number]          Accession #:    [id number] Date of Birth: 07/24/1922          Patient Gender: F Patient Age:   89 years Exam Location:  [facility] Procedure:      VAS US  ABI WITH/WO TBI Referring Phys: Sue Knapp  --------------------------------------------------------------------------------   Indications: Peripheral artery disease. left 2nd toe wound   Vascular Interventions: 06/08/2024: Aortogram and Selective Left Lower Extremity                         Angiogram. PTA of Left ATA with 2 mm diameter by 30 cm  length angioplasty balloon. PTA of the Left Mid to                         distal SFA and above knee Popliteal artery with 4 mm                         diameter Lutonix drug coated angioplasty balloon.  Comparison Study: 05/15/2024  Performing  Technologist: Sue Knapp    Examination Guidelines: A complete evaluation includes at minimum, Doppler waveform signals and systolic blood pressure reading at the level of bilateral brachial, anterior tibial, and posterior tibial arteries, when vessel segments are accessible. Bilateral testing is considered an integral part of a complete examination. Photoelectric Plethysmograph (PPG) waveforms and toe systolic pressure readings are included as required and additional duplex testing as needed. Limited examinations for reoccurring indications may be performed as noted.    ABI Findings: +---------+------------------+-----+--------+------------+ Right    Rt Pressure (mmHg)IndexWaveformComment      +---------+------------------+-----+--------+------------+ Brachial 142                                         +---------+------------------+-----+--------+------------+ ATA      250               1.76         [place]           +---------+------------------+-----+--------+------------+ Great Toe                               Not Obtained +---------+------------------+-----+--------+------------+  +---------+------------------+-----+----------+----------------------+ Left     Lt Pressure (mmHg)IndexWaveform  Comment                +---------+------------------+-----+----------+----------------------+ Brachial                                  Restricted Arm,Breast  +---------+------------------+-----+----------+----------------------+ ATA      156               1.10 biphasic                         +---------+------------------+-----+----------+----------------------+ PTA                             monophasic                       +---------+------------------+-----+----------+----------------------+ Great Toe                                 Bandaged, Not  Obtained +---------+------------------+-----+----------+----------------------+  +-------+-----------+------------+------------+------------+ ABI/TBIToday's ABIToday's TBI Previous ABIPrevious TBI +-------+-----------+------------+------------+------------+ Right  >1.0 V[place]user    Not obtained1.20        .20          +-------+-----------+------------+------------+------------+ Left   1.10       Not obtained1.11        .39          +-------+-----------+------------+------------+------------+     Bilateral ABIs appear essentially unchanged compared to prior study on 05/15/2024.   Summary: Right: Resting right ankle-brachial index indicates noncompressible right lower extremity arteries.  Unable to obtain TBIs due to Patient's inability to remain still and Bandages covering the Toes.  Left: Resting left ankle-brachial index is within normal range.   Unable to obtain TBIs due to Patient's inability to remain still and Bandages covering the Toes.    *See table(s) above for measurements and observations.    Electronically signed by Sue Gu MD on 06/24/2024 at 3:56:05 PM.      Final   CUP PACEART INCLINIC DEVICE CHECK Normal _dual__ chamber pacemaker wound check, programmed VVI d/t perm AFib. Presenting rhythm: _VP__ . Wound well healed. Routine testing of thresholds, sensing, and impedance demonstrate stable parameters and no programming changes needed at this time.  Brief NSVT episode. Estimated longevity _12.7 years___ years. Pt enrolled in remote follow-up.  Sue Needle, Sue Knapp  Assessment  The primary encounter diagnosis was Chronic pain syndrome. 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  Plan of Care  Problem-specific:   Discontinue Tramadol  ER 200 mg   Started on Tramadol  ER 100 mg daily.  The patient's pain is controlled with tramadol , will continue on current medication regimen.  Prescribing drug monitoring (PDMP) reviewed, findings consistent with the use of prescribed medication and no evidence of narcotic misuse or abuse.  No side effects or adverse reaction reported to medication.  Schedule follow-up in 3 months virtual visit for medication management..   Ms. DREAM HARMAN has a current medication list which includes the following long-term medication(s): apixaban , atorvastatin , hyoscyamine, metoprolol  succinate, omeprazole , torsemide, tramadol , and tramadol .  Pharmacotherapy (Medications Ordered): Meds ordered this encounter  Medications   traMADol  (ULTRAM ) 50 MG tablet    Sig: Take 1 tablet (50 mg total) by mouth 2 (two) times daily. 50 mg at 10 am, 50 mg at 6pm    Dispense:  60 tablet    Refill:  3   traMADol  (ULTRAM -ER) 200 MG 24 hr tablet    Sig: Take 1 tablet (200 mg total) by mouth daily. At 9 pm, after dinner    Dispense:  90 tablet    Refill:  0   Orders:  No orders of the defined types were placed in this encounter.  Follow-up plan:   Return in about 3 months (around 09/23/2024) for (VV), (MM), Emmy Blanch Sue Knapp.         Recent Visits Date Type Provider Dept  03/30/24 Office Visit Janea Schwenn K, Sue Knapp [facility]  Showing recent visits within past 90 days and meeting all other requirements Today's Visits Date Type Provider Dept  06/25/24 Office Visit Ronold Hardgrove K, Sue Knapp [facility]  Showing today's visits and meeting all other requirements Future Appointments No visits were found meeting these conditions. Showing future appointments within next 90 days and meeting all other requirements  I discussed the assessment and treatment plan with the patient. The patient was provided an opportunity to ask questions and all were answered. The patient agreed with the plan and demonstrated an  understanding of the instructions.  Patient advised to call back or seek an in-person evaluation if the symptoms or condition worsens.  Duration of encounter: 20 minutes.  Note by: Emmy MARLA Blanch, Sue Knapp Date: 06/25/2024; Time: 3:31 PM

## 2024-06-25 NOTE — Addendum Note (Signed)
 Addended by: TOBIE RICHMOND on: 06/25/2024 03:51 PM   Modules accepted: Orders

## 2024-06-25 NOTE — Telephone Encounter (Signed)
 Cancelled prescription for Tramadol  ER 200 mg.

## 2024-06-25 NOTE — Telephone Encounter (Signed)
 Spoke with the patient's daughter and she is scheduled with Dr. Marea for a RLE angio on 07/06/24 with a 8:30 am arrival time to the [facility]. Pre-procedure instructions were discussed and will be mailed. Patient's daughter was offered 06/29/24 and declined.

## 2024-06-25 NOTE — Telephone Encounter (Signed)
 noted

## 2024-06-28 ENCOUNTER — Ambulatory Visit: Payer: Self-pay | Admitting: Cardiology

## 2024-06-28 ENCOUNTER — Encounter (INDEPENDENT_AMBULATORY_CARE_PROVIDER_SITE_OTHER): Payer: Self-pay | Admitting: Nurse Practitioner

## 2024-06-28 NOTE — Progress Notes (Signed)
Subjective:    Patient ID: Sue Knapp, female    DOB: [birth year], 89 y.o.   MRN: [id number] Chief Complaint  Patient presents with   Follow-up    AbI u/s follow up    HPI  Discussed the use of AI scribe software for clinical note transcription with the patient, who gave verbal consent to proceed.  History of Present Illness Sue Knapp is a 89 year old female who presents with multiple leg wounds and concerns about wound care.  She underwent a revascularization procedure on her left leg on June 08, 2024, due to a toe wound. Post-procedure, she has experienced increased pain in the surgery leg, which was initially expected due to improved blood flow and nerve activity.  Recently, new wounds have developed on her heel, which were not present at the time of surgery. Her daughter suspects that a protective hard shoe with a blunt end, previously used, may have caused a blister. Both feet are experiencing issues, and there is concern about the current bandaging method, which involves an ACE bandage instead of the recommended sticky wrap like Coban to keep the bandages airy and prevent skin damage.  There is a delay in arranging an appointment at a wound care center until January due to availability issues.  She has arthritic knees, contributing to discomfort and mobility issues, and experiences spasms, which her daughter compares to symptoms experienced by her father who had MS. She relies on a wheelchair van for transportation, indicating mobility challenges.    Results DIAGNOSTIC ABI: Noncompressible on the right.  Previous toe waveforms were 0.20.  They were not taken today due to the bandages.  The left has an ABI of 1.10 phasic waveforms in the anterior tibial of the left lower extremity.   Review of Systems  Skin:  Positive for wound.  Neurological:  Positive for weakness.  All other systems reviewed and are negative.      Objective:   Physical Exam Vitals  reviewed.  HENT:     Head: Normocephalic.  Cardiovascular:     Rate and Rhythm: Normal rate.  Pulmonary:     Effort: Pulmonary effort is normal.  Musculoskeletal:     Right lower leg: Edema present.     Left lower leg: Edema present.  Skin:    General: Skin is dry.     Comments: > cap refil of RLE and cool  Neurological:     Mental Status: She is alert. Mental status is at baseline.     Motor: Weakness present.     Gait: Gait abnormal.  Psychiatric:        Mood and Affect: Mood normal.        Behavior: Behavior normal.        Thought Content: Thought content normal.        Judgment: Judgment normal.     Physical Exam EXTREMITIES: Left leg warmer than right leg. Multiple wounds on left heel. Right foot with fewer wounds than left.  BP 126/65 (BP Location: Right Arm)   Pulse 61   Resp 16   Past Medical History:  Diagnosis Date   Arthritis    Breast cancer (HCC) 2013   left breast, radiation and lumpectomy   Cataract    Fatigue    GERD (gastroesophageal reflux disease)    Glaucoma    Heart murmur    Heart palpitations    Hiatal hernia    HOH (hard of hearing)    AIDS  Hyperlipidemia    Hypertension    Hypothyroidism    Left ankle injury    Old left ankle injury   Left bundle branch block    Neuropathy    Osteoporosis    Pacemaker    Palpitations    Personal history of radiation therapy 2013   left breast ca   Scoliosis    Shortness of breath dyspnea    Thyroid  nodule    TIA (transient ischemic attack)     Social History   Socioeconomic History   Marital status: Widowed    Spouse name: Not on file   Number of children: 3   Years of education: Not on file   Highest education level: High school graduate  Occupational History   Not on file  Tobacco Use   Smoking status: Never   Smokeless tobacco: Never  Vaping Use   Vaping status: Never Used  Substance and Sexual Activity   Alcohol use: No   Drug use: Never    Comment: pain clinic: tramadol     Sexual activity: Not Currently  Other Topics Concern   Not on file  Social History Narrative   Not on file   Social Drivers of Health   Financial Resource Strain: Low Risk  (07/11/2021)   Overall Financial Resource Strain (CARDIA)    Difficulty of Paying Living Expenses: Not hard at all  Food Insecurity: No Food Insecurity (07/11/2021)   Hunger Vital Sign    Worried About Running Out of Food in the Last Year: Never true    Ran Out of Food in the Last Year: Never true  Transportation Needs: No Transportation Needs (07/11/2021)   PRAPARE - Administrator, Civil Service (Medical): No    Lack of Transportation (Non-Medical): No  Physical Activity: Inactive (07/11/2021)   Exercise Vital Sign    Days of Exercise per Week: 0 days    Minutes of Exercise per Session: 0 min  Stress: Stress Concern Present (07/11/2021)   [employer] of Occupational Health - Occupational Stress Questionnaire    Feeling of Stress : To some extent  Social Connections: Moderately Integrated (07/11/2021)   Social Connection and Isolation Panel    Frequency of Communication with Friends and Family: More than three times a week    Frequency of Social Gatherings with Friends and Family: More than three times a week    Attends Religious Services: More than 4 times per year    Active Member of [employer] or Organizations: Yes    Attends Banker Meetings: More than 4 times per year    Marital Status: Widowed  Intimate Partner Violence: Not At Risk (07/11/2021)   Humiliation, Afraid, Rape, and Kick questionnaire    Fear of Current or Ex-Partner: No    Emotionally Abused: No    Physically Abused: No    Sexually Abused: No    Past Surgical History:  Procedure Laterality Date   ABDOMINAL HYSTERECTOMY     BREAST BIOPSY Left 2005   bengin   BREAST BIOPSY Left 2013   stereo bx, invasive tubular carcinoma and DCIS   BREAST LUMPECTOMY Left 02/21/2012   invasive tubular carcinoma and DCIS,  clear margins, negative LN   BREAST SURGERY     LUMPECTOMY   CATARACT EXTRACTION W/PHACO Right 03/01/2015   Procedure: CATARACT EXTRACTION PHACO AND INTRAOCULAR LENS PLACEMENT (IOC);  Surgeon: Elsie Carmine, MD;  Location: [facility];  Service: Ophthalmology;  Laterality: Right;  US :1:03.1 AP:22.8 CDE:14.37  Fluid lot #  [id number]   CATARACT EXTRACTION W/PHACO Left 03/22/2015   Procedure: CATARACT EXTRACTION PHACO AND INTRAOCULAR LENS PLACEMENT (IOC);  Surgeon: Elsie Carmine, MD;  Location: [facility];  Service: Ophthalmology;  Laterality: Left;  US : 01:01.0 AP%: 22.5% CDE: 13.75 Lot # [id number]   ESOPHAGOGASTRODUODENOSCOPY (EGD) WITH PROPOFOL  N/A 12/04/2020   Procedure: ESOPHAGOGASTRODUODENOSCOPY (EGD) WITH PROPOFOL ;  Surgeon: Unk Corinn Skiff, MD;  Location: [facility];  Service: Gastroenterology;  Laterality: N/A;   ESOPHAGOGASTRODUODENOSCOPY (EGD) WITH PROPOFOL  N/A 12/04/2020   Procedure: ESOPHAGOGASTRODUODENOSCOPY (EGD) WITH PROPOFOL ;  Surgeon: Unk Corinn Skiff, MD;  Location: [facility];  Service: Gastroenterology;  Laterality: N/A;   EYE SURGERY  09/2020 and 10/2020   LOWER EXTREMITY ANGIOGRAPHY Left 06/08/2024   Procedure: Lower Extremity Angiography;  Surgeon: Marea Selinda RAMAN, MD;  Location: [facility];  Service: Cardiovascular;  Laterality: Left;   MASTECTOMY PARTIAL / LUMPECTOMY     PPM GENERATOR CHANGEOUT N/A 02/10/2024   Procedure: PPM GENERATOR CHANGEOUT;  Surgeon: Cindie Ole DASEN, MD;  Location: [facility];  Service: Cardiovascular;  Laterality: N/A;   THYROIDECTOMY, PARTIAL      Family History  Problem Relation Age of Onset   Breast cancer Neg Hx     Allergies  Allergen Reactions   Codeine Other (See Comments)    Alt Ment Status (per notation in 2013)  Altered mental status    Other reaction(s): Other (See Comments)  Alt Ment Status (per notation in 2013) Altered mental status   Etodolac Other (See Comments)       Latest Ref Rng &  Units 06/17/2024   11:12 AM 05/14/2024    3:00 PM 11/12/2023   12:05 PM  CBC  WBC 4.0 - 10.5 K/uL 9.1  5.5  9.4   Hemoglobin 12.0 - 15.0 g/dL 87.3  85.9  86.6   Hematocrit 36.0 - 46.0 % 38.4  43.4  42.4   Platelets 150.0 - 400.0 K/uL 345.0  235.0  275       CMP     Component Value Date/Time   NA 143 05/14/2024 1500   NA 143 11/12/2023 1205   NA 138 06/09/2013 0925   K 4.1 05/14/2024 1500   K 3.9 06/09/2013 0925   CL 99 05/14/2024 1500   CL 105 06/09/2013 0925   CO2 32 05/14/2024 1500   CO2 28 06/09/2013 0925   GLUCOSE 93 05/14/2024 1500   GLUCOSE 135 (H) 06/09/2013 0925   BUN 52 (H) 06/08/2024 1047   BUN 47 (H) 11/12/2023 1205   BUN 23 (H) 06/09/2013 0925   CREATININE 1.55 (H) 06/08/2024 1047   CREATININE 1.13 (H) 07/11/2021 1104   CALCIUM  9.6 05/14/2024 1500   CALCIUM  9.0 06/09/2013 0925   PROT 6.3 05/14/2024 1500   PROT 6.5 11/12/2023 1205   PROT 5.5 (L) 05/28/2013 0452   ALBUMIN 3.7 05/14/2024 1500   ALBUMIN 3.8 11/12/2023 1205   ALBUMIN 2.6 (L) 05/28/2013 0452   AST 38 (H) 05/14/2024 1500   AST 30 05/28/2013 0452   ALT 32 05/14/2024 1500   ALT 13 05/28/2013 0452   ALKPHOS 187 (H) 06/17/2024 1112   ALKPHOS 67 05/28/2013 0452   BILITOT 1.1 05/14/2024 1500   BILITOT 0.9 11/12/2023 1205   BILITOT 0.3 05/28/2013 0452   GFR 29.12 (L) 05/14/2024 1500   EGFR 34 (L) 11/12/2023 1205   GFRNONAA 30 (L) 06/08/2024 1047   GFRNONAA 32 (L) 11/28/2020 1128     VAS US  ABI WITH/WO TBI Result Date: 06/24/2024  LOWER EXTREMITY DOPPLER STUDY Patient Name:  CHERAY PARDI  Date of Exam:   06/24/2024 Medical Rec #: [id number]          Accession #:    [id number] Date of Birth: 01/19/1923          Patient Gender: F Patient Age:   62 years Exam Location:  [facility] Procedure:      VAS US  ABI WITH/WO TBI Referring Phys: Selinda Gu --------------------------------------------------------------------------------  Indications: Peripheral artery disease. left 2nd toe wound   Vascular Interventions: 06/08/2024: Aortogram and Selective Left Lower Extremity                         Angiogram. PTA of Left ATA with 2 mm diameter by 30 cm                         length angioplasty balloon. PTA of the Left Mid to                         distal SFA and above knee Popliteal artery with 4 mm                         diameter Lutonix drug coated angioplasty balloon. Comparison Study: 05/15/2024 Performing Technologist: Leafy Gibes RVS  Examination Guidelines: A complete evaluation includes at minimum, Doppler waveform signals and systolic blood pressure reading at the level of bilateral brachial, anterior tibial, and posterior tibial arteries, when vessel segments are accessible. Bilateral testing is considered an integral part of a complete examination. Photoelectric Plethysmograph (PPG) waveforms and toe systolic pressure readings are included as required and additional duplex testing as needed. Limited examinations for reoccurring indications may be performed as noted.  ABI Findings: +---------+------------------+-----+--------+------------+ Right    Rt Pressure (mmHg)IndexWaveformComment      +---------+------------------+-----+--------+------------+ Brachial 142                                         +---------+------------------+-----+--------+------------+ ATA      250               1.76         [place]           +---------+------------------+-----+--------+------------+ Great Toe                               Not Obtained +---------+------------------+-----+--------+------------+ +---------+------------------+-----+----------+----------------------+ Left     Lt Pressure (mmHg)IndexWaveform  Comment                +---------+------------------+-----+----------+----------------------+ Brachial                                  Restricted Arm,Breast  +---------+------------------+-----+----------+----------------------+ ATA      156               1.10  biphasic                         +---------+------------------+-----+----------+----------------------+ PTA                             monophasic                       +---------+------------------+-----+----------+----------------------+  Great Toe                                 Bandaged, Not Obtained +---------+------------------+-----+----------+----------------------+ +-------+-----------+------------+------------+------------+ ABI/TBIToday's ABIToday's TBI Previous ABIPrevious TBI +-------+-----------+------------+------------+------------+ Right  >1.0 [place]    Not obtained1.20        .20          +-------+-----------+------------+------------+------------+ Left   1.10       Not obtained1.11        .39          +-------+-----------+------------+------------+------------+  Bilateral ABIs appear essentially unchanged compared to prior study on 05/15/2024.  Summary: Right: Resting right ankle-brachial index indicates noncompressible right lower extremity arteries.  Unable to obtain TBIs due to Patient's inability to remain still and Bandages covering the Toes. Left: Resting left ankle-brachial index is within normal range.  Unable to obtain TBIs due to Patient's inability to remain still and Bandages covering the Toes.  *See table(s) above for measurements and observations.  Electronically signed by Selinda Gu MD on 06/24/2024 at 3:56:05 PM.    Final    VAS US  ABI WITH/WO TBI Result Date: 05/20/2024  LOWER EXTREMITY DOPPLER STUDY Patient Name:  MAKAILEY HODGKIN  Date of Exam:   05/15/2024 Medical Rec #: [id number]          Accession #:    [id number] Date of Birth: 1923/05/02          Patient Gender: F Patient Age:   46 years Exam Location:  [facility] Procedure:      VAS US  ABI WITH/WO TBI Referring Phys: SELINDA DEW --------------------------------------------------------------------------------  Indications: Peripheral artery disease. left 2nd toe wound  Performing  Technologist: Jerel Croak RVT  Examination Guidelines: A complete evaluation includes at minimum, Doppler waveform signals and systolic blood pressure reading at the level of bilateral brachial, anterior tibial, and posterior tibial arteries, when vessel segments are accessible. Bilateral testing is considered an integral part of a complete examination. Photoelectric Plethysmograph (PPG) waveforms and toe systolic pressure readings are included as required and additional duplex testing as needed. Limited examinations for reoccurring indications may be performed as noted.  ABI Findings: +---------+------------------+-----+----------+--------+ Right    Rt Pressure (mmHg)IndexWaveform  Comment  +---------+------------------+-----+----------+--------+ Brachial 128                                       +---------+------------------+-----+----------+--------+ PTA      144               1.12 monophasic         +---------+------------------+-----+----------+--------+ DP       153               1.20 biphasic           +---------+------------------+-----+----------+--------+ Great Toe26                0.20 Abnormal           +---------+------------------+-----+----------+--------+ +---------+------------------+-----+----------+-------+ Left     Lt Pressure (mmHg)IndexWaveform  Comment +---------+------------------+-----+----------+-------+ PTA      123               0.96 monophasic        +---------+------------------+-----+----------+-------+ DP       142               1.11 monophasic        +---------+------------------+-----+----------+-------+  Great Toe50                0.39 Abnormal          +---------+------------------+-----+----------+-------+  Summary: Right: Resting right ankle-brachial index is within normal range. The right toe-brachial index is abnormal.  Left: Resting left ankle-brachial index is within normal range. The left toe-brachial index is abnormal.   *See table(s) above for measurements and observations.  Electronically signed by Selinda Gu MD on 05/20/2024 at 1:17:08 PM.    Final        Assessment & Plan:   1. Atherosclerosis of native arteries of the extremities with ulceration (HCC) (Primary)  Recommend:  The patient has evidence of severe atherosclerotic changes of both lower extremities associated with ulceration and tissue loss of the right foot.  This represents a limb threatening ischemia and places the patient at the risk for right limb loss.  Patient should undergo angiography of the right lower extremity with the hope for intervention for limb salvage.  The risks and benefits as well as the alternative therapies was discussed in detail with the patient.  All questions were answered.  Patient agrees to proceed with right angiography.  The patient will follow up with me in the office after the procedure.  - VAS US  ABI WITH/WO TBI; Future - VAS US  LOWER EXTREMITY ARTERIAL DUPLEX; Future  2. Cellulitis and abscess of toe of left foot Chronic left foot and heel ulcers due to peripheral arterial disease Chronic ulcers on the left foot and heel, exacerbated by peripheral arterial disease. Recent revascularization on the left leg. Multiple open wounds with fibrinous exudate indicating healing. Current bandaging technique is suboptimal, with preference for non-stick dressings. Santal ointment recommended for its debriding properties. Wound care center appointment delayed until January, but efforts to expedite are ongoing. - Prescribed Santal ointment from a specialty pharmacy. - Instructed to apply Santal ointment followed by gauze and wrap with Coban or Curlex and Ace wrap. - Referred to wound care center for specialized management. - Attempted to expedite wound care center appointment.   Current Outpatient Medications on File Prior to Visit  Medication Sig Dispense Refill   acetaminophen  (TYLENOL ) 650 MG CR tablet Take 2 tablets  (1,300 mg total) by mouth 2 (two) times daily as needed for pain.     apixaban  (ELIQUIS ) 2.5 MG TABS tablet Take 1 tablet (2.5 mg total) by mouth 2 (two) times daily. 60 tablet 6   atorvastatin  (LIPITOR) 10 MG tablet Take 1 tablet (10 mg total) by mouth daily. 30 tablet 11   hydrOXYzine (ATARAX) 10 MG tablet Take 10 mg by mouth every 12 (twelve) hours as needed for anxiety.     hyoscyamine (LEVSIN) 0.125 MG tablet Take 0.125 mg by mouth every 8 (eight) hours as needed (bloating, GI upset).     Lactobacillus (AZO DUAL PROTECTION) CAPS Take 1 capsule by mouth daily. 30 capsule 11   losartan  (COZAAR ) 50 MG tablet Take 50 mg by mouth daily.     melatonin 5 MG TABS Take 5 mg by mouth at bedtime as needed (insomnia).     metoprolol  succinate (TOPROL -XL) 50 MG 24 hr tablet TAKE 1 TABLET BY MOUTH DAILY 30 tablet 6   Multiple Vitamins-Minerals (MULTIVITAMIN WITH IRON-MINERALS) liquid Take by mouth daily.     Multiple Vitamins-Minerals (MULTIVITAMIN WITH MINERALS) tablet Take 1 tablet by mouth daily. Centrum silver 50+     NYAMYC powder Apply topically.     omeprazole  (PRILOSEC  OTC) 20 MG tablet  Take 1 tablet (20 mg total) by mouth daily. 30 tablet 0   polyethylene glycol (MIRALAX / GLYCOLAX) 17 g packet Take 17 g by mouth at bedtime as needed for mild constipation, moderate constipation or severe constipation.     torsemide (DEMADEX) 20 MG tablet Take 20 mg by mouth daily.     trimethoprim (TRIMPEX) 100 MG tablet Take 100 mg by mouth 2 (two) times daily.     zinc oxide (BALMEX) 11.3 % CREA cream Apply 1 Application topically 2 (two) times daily as needed.     No current facility-administered medications on file prior to visit.    Patient Instructions   VISIT SUMMARY: During today's visit, we discussed the multiple leg wounds you are experiencing and your concerns about wound care. We reviewed your recent revascularization procedure on your left leg and the new wounds that have developed on your heel.  We also addressed your chronic ulcers, peripheral arterial disease, and the planned revascularization for your right leg.  YOUR PLAN: -CHRONIC LEFT FOOT AND HEEL ULCERS DUE TO PERIPHERAL ARTERIAL DISEASE: You have chronic ulcers on your left foot and heel, which are worsened by poor blood flow. We recommend using Santal ointment, which helps clean the wounds, and changing your bandaging technique to use non-stick dressings like Coban or Curlex. We are also working to get you an earlier appointment at the wound care center.  -PERIPHERAL ARTERIAL DISEASE OF BILATERAL LOWER EXTREMITIES: Peripheral arterial disease means that the blood flow to your legs is reduced. Recent tests show improved blood flow in your left leg. We will continue to monitor your condition and have scheduled another blood flow test in four weeks.  -PLANNED RIGHT LEG REVASCULARIZATION FOR ARTERIAL INSUFFICIENCY AND ULCERS: We are planning a procedure to improve blood flow in your right leg, which has ulcers and poor circulation. This procedure was delayed due to a recent accident, but we are working to reschedule it soon.  INSTRUCTIONS: Please apply Santal ointment to your wounds, cover with gauze, and wrap with Coban or Curlex and an Ace wrap as instructed. Attend your scheduled arterial duplex in four weeks to monitor blood flow. We will notify you once we have rescheduled your right leg revascularization procedure. Continue to monitor your wounds and contact us  if you notice any changes or have concerns.   Please Avoid use of sticky bandages on or near wound sites, except for area of Right leg                    Contains text generated by [employer].                                 Contains text generated by [employer]bridge.   Return in about 4 weeks (around 07/16/2024) for ABI and BLE art duplex JD/FB.   Kalyani Maeda E Keiton Cosma, NP

## 2024-06-28 NOTE — H&P (View-Only) (Signed)
Subjective:    Patient ID: Sue Knapp, female    DOB: [birth year], 89 y.o.   MRN: [id number] Chief Complaint  Patient presents with   Follow-up    AbI u/s follow up    HPI  Discussed the use of AI scribe software for clinical note transcription with the patient, who gave verbal consent to proceed.  History of Present Illness Sue Knapp is a 89 year old female who presents with multiple leg wounds and concerns about wound care.  She underwent a revascularization procedure on her left leg on June 08, 2024, due to a toe wound. Post-procedure, she has experienced increased pain in the surgery leg, which was initially expected due to improved blood flow and nerve activity.  Recently, new wounds have developed on her heel, which were not present at the time of surgery. Her daughter suspects that a protective hard shoe with a blunt end, previously used, may have caused a blister. Both feet are experiencing issues, and there is concern about the current bandaging method, which involves an ACE bandage instead of the recommended sticky wrap like Coban to keep the bandages airy and prevent skin damage.  There is a delay in arranging an appointment at a wound care center until January due to availability issues.  She has arthritic knees, contributing to discomfort and mobility issues, and experiences spasms, which her daughter compares to symptoms experienced by her father who had MS. She relies on a wheelchair van for transportation, indicating mobility challenges.    Results DIAGNOSTIC ABI: Noncompressible on the right.  Previous toe waveforms were 0.20.  They were not taken today due to the bandages.  The left has an ABI of 1.10 phasic waveforms in the anterior tibial of the left lower extremity.   Review of Systems  Skin:  Positive for wound.  Neurological:  Positive for weakness.  All other systems reviewed and are negative.      Objective:   Physical Exam Vitals  reviewed.  HENT:     Head: Normocephalic.  Cardiovascular:     Rate and Rhythm: Normal rate.  Pulmonary:     Effort: Pulmonary effort is normal.  Musculoskeletal:     Right lower leg: Edema present.     Left lower leg: Edema present.  Skin:    General: Skin is dry.     Comments: > cap refil of RLE and cool  Neurological:     Mental Status: She is alert. Mental status is at baseline.     Motor: Weakness present.     Gait: Gait abnormal.  Psychiatric:        Mood and Affect: Mood normal.        Behavior: Behavior normal.        Thought Content: Thought content normal.        Judgment: Judgment normal.     Physical Exam EXTREMITIES: Left leg warmer than right leg. Multiple wounds on left heel. Right foot with fewer wounds than left.  BP 126/65 (BP Location: Right Arm)   Pulse 61   Resp 16   Past Medical History:  Diagnosis Date   Arthritis    Breast cancer (HCC) 2013   left breast, radiation and lumpectomy   Cataract    Fatigue    GERD (gastroesophageal reflux disease)    Glaucoma    Heart murmur    Heart palpitations    Hiatal hernia    HOH (hard of hearing)    AIDS  Hyperlipidemia    Hypertension    Hypothyroidism    Left ankle injury    Old left ankle injury   Left bundle branch block    Neuropathy    Osteoporosis    Pacemaker    Palpitations    Personal history of radiation therapy 2013   left breast ca   Scoliosis    Shortness of breath dyspnea    Thyroid  nodule    TIA (transient ischemic attack)     Social History   Socioeconomic History   Marital status: Widowed    Spouse name: Not on file   Number of children: 3   Years of education: Not on file   Highest education level: High school graduate  Occupational History   Not on file  Tobacco Use   Smoking status: Never   Smokeless tobacco: Never  Vaping Use   Vaping status: Never Used  Substance and Sexual Activity   Alcohol use: No   Drug use: Never    Comment: pain clinic: tramadol     Sexual activity: Not Currently  Other Topics Concern   Not on file  Social History Narrative   Not on file   Social Drivers of Health   Financial Resource Strain: Low Risk  (07/11/2021)   Overall Financial Resource Strain (CARDIA)    Difficulty of Paying Living Expenses: Not hard at all  Food Insecurity: No Food Insecurity (07/11/2021)   Hunger Vital Sign    Worried About Running Out of Food in the Last Year: Never true    Ran Out of Food in the Last Year: Never true  Transportation Needs: No Transportation Needs (07/11/2021)   PRAPARE - Administrator, Civil Service (Medical): No    Lack of Transportation (Non-Medical): No  Physical Activity: Inactive (07/11/2021)   Exercise Vital Sign    Days of Exercise per Week: 0 days    Minutes of Exercise per Session: 0 min  Stress: Stress Concern Present (07/11/2021)   [employer] of Occupational Health - Occupational Stress Questionnaire    Feeling of Stress : To some extent  Social Connections: Moderately Integrated (07/11/2021)   Social Connection and Isolation Panel    Frequency of Communication with Friends and Family: More than three times a week    Frequency of Social Gatherings with Friends and Family: More than three times a week    Attends Religious Services: More than 4 times per year    Active Member of [employer] or Organizations: Yes    Attends Banker Meetings: More than 4 times per year    Marital Status: Widowed  Intimate Partner Violence: Not At Risk (07/11/2021)   Humiliation, Afraid, Rape, and Kick questionnaire    Fear of Current or Ex-Partner: No    Emotionally Abused: No    Physically Abused: No    Sexually Abused: No    Past Surgical History:  Procedure Laterality Date   ABDOMINAL HYSTERECTOMY     BREAST BIOPSY Left 2005   bengin   BREAST BIOPSY Left 2013   stereo bx, invasive tubular carcinoma and DCIS   BREAST LUMPECTOMY Left 02/21/2012   invasive tubular carcinoma and DCIS,  clear margins, negative LN   BREAST SURGERY     LUMPECTOMY   CATARACT EXTRACTION W/PHACO Right 03/01/2015   Procedure: CATARACT EXTRACTION PHACO AND INTRAOCULAR LENS PLACEMENT (IOC);  Surgeon: Elsie Carmine, MD;  Location: [facility];  Service: Ophthalmology;  Laterality: Right;  US :1:03.1 AP:22.8 CDE:14.37  Fluid lot #  [id number]   CATARACT EXTRACTION W/PHACO Left 03/22/2015   Procedure: CATARACT EXTRACTION PHACO AND INTRAOCULAR LENS PLACEMENT (IOC);  Surgeon: Elsie Carmine, MD;  Location: [facility];  Service: Ophthalmology;  Laterality: Left;  US : 01:01.0 AP%: 22.5% CDE: 13.75 Lot # [id number]   ESOPHAGOGASTRODUODENOSCOPY (EGD) WITH PROPOFOL  N/A 12/04/2020   Procedure: ESOPHAGOGASTRODUODENOSCOPY (EGD) WITH PROPOFOL ;  Surgeon: Unk Corinn Skiff, MD;  Location: [facility];  Service: Gastroenterology;  Laterality: N/A;   ESOPHAGOGASTRODUODENOSCOPY (EGD) WITH PROPOFOL  N/A 12/04/2020   Procedure: ESOPHAGOGASTRODUODENOSCOPY (EGD) WITH PROPOFOL ;  Surgeon: Unk Corinn Skiff, MD;  Location: [facility];  Service: Gastroenterology;  Laterality: N/A;   EYE SURGERY  09/2020 and 10/2020   LOWER EXTREMITY ANGIOGRAPHY Left 06/08/2024   Procedure: Lower Extremity Angiography;  Surgeon: Marea Selinda RAMAN, MD;  Location: [facility];  Service: Cardiovascular;  Laterality: Left;   MASTECTOMY PARTIAL / LUMPECTOMY     PPM GENERATOR CHANGEOUT N/A 02/10/2024   Procedure: PPM GENERATOR CHANGEOUT;  Surgeon: Cindie Ole DASEN, MD;  Location: [facility];  Service: Cardiovascular;  Laterality: N/A;   THYROIDECTOMY, PARTIAL      Family History  Problem Relation Age of Onset   Breast cancer Neg Hx     Allergies  Allergen Reactions   Codeine Other (See Comments)    Alt Ment Status (per notation in 2013)  Altered mental status    Other reaction(s): Other (See Comments)  Alt Ment Status (per notation in 2013) Altered mental status   Etodolac Other (See Comments)       Latest Ref Rng &  Units 06/17/2024   11:12 AM 05/14/2024    3:00 PM 11/12/2023   12:05 PM  CBC  WBC 4.0 - 10.5 K/uL 9.1  5.5  9.4   Hemoglobin 12.0 - 15.0 g/dL 87.3  85.9  86.6   Hematocrit 36.0 - 46.0 % 38.4  43.4  42.4   Platelets 150.0 - 400.0 K/uL 345.0  235.0  275       CMP     Component Value Date/Time   NA 143 05/14/2024 1500   NA 143 11/12/2023 1205   NA 138 06/09/2013 0925   K 4.1 05/14/2024 1500   K 3.9 06/09/2013 0925   CL 99 05/14/2024 1500   CL 105 06/09/2013 0925   CO2 32 05/14/2024 1500   CO2 28 06/09/2013 0925   GLUCOSE 93 05/14/2024 1500   GLUCOSE 135 (H) 06/09/2013 0925   BUN 52 (H) 06/08/2024 1047   BUN 47 (H) 11/12/2023 1205   BUN 23 (H) 06/09/2013 0925   CREATININE 1.55 (H) 06/08/2024 1047   CREATININE 1.13 (H) 07/11/2021 1104   CALCIUM  9.6 05/14/2024 1500   CALCIUM  9.0 06/09/2013 0925   PROT 6.3 05/14/2024 1500   PROT 6.5 11/12/2023 1205   PROT 5.5 (L) 05/28/2013 0452   ALBUMIN 3.7 05/14/2024 1500   ALBUMIN 3.8 11/12/2023 1205   ALBUMIN 2.6 (L) 05/28/2013 0452   AST 38 (H) 05/14/2024 1500   AST 30 05/28/2013 0452   ALT 32 05/14/2024 1500   ALT 13 05/28/2013 0452   ALKPHOS 187 (H) 06/17/2024 1112   ALKPHOS 67 05/28/2013 0452   BILITOT 1.1 05/14/2024 1500   BILITOT 0.9 11/12/2023 1205   BILITOT 0.3 05/28/2013 0452   GFR 29.12 (L) 05/14/2024 1500   EGFR 34 (L) 11/12/2023 1205   GFRNONAA 30 (L) 06/08/2024 1047   GFRNONAA 32 (L) 11/28/2020 1128     VAS US  ABI WITH/WO TBI Result Date: 06/24/2024  LOWER EXTREMITY DOPPLER STUDY Patient Name:  Sue Knapp  Date of Exam:   06/24/2024 Medical Rec #: [id number]          Accession #:    [id number] Date of Birth: 12/21/1922          Patient Gender: F Patient Age:   59 years Exam Location:  [facility] Procedure:      VAS US  ABI WITH/WO TBI Referring Phys: Selinda Gu --------------------------------------------------------------------------------  Indications: Peripheral artery disease. left 2nd toe wound   Vascular Interventions: 06/08/2024: Aortogram and Selective Left Lower Extremity                         Angiogram. PTA of Left ATA with 2 mm diameter by 30 cm                         length angioplasty balloon. PTA of the Left Mid to                         distal SFA and above knee Popliteal artery with 4 mm                         diameter Lutonix drug coated angioplasty balloon. Comparison Study: 05/15/2024 Performing Technologist: Leafy Gibes RVS  Examination Guidelines: A complete evaluation includes at minimum, Doppler waveform signals and systolic blood pressure reading at the level of bilateral brachial, anterior tibial, and posterior tibial arteries, when vessel segments are accessible. Bilateral testing is considered an integral part of a complete examination. Photoelectric Plethysmograph (PPG) waveforms and toe systolic pressure readings are included as required and additional duplex testing as needed. Limited examinations for reoccurring indications may be performed as noted.  ABI Findings: +---------+------------------+-----+--------+------------+ Right    Rt Pressure (mmHg)IndexWaveformComment      +---------+------------------+-----+--------+------------+ Brachial 142                                         +---------+------------------+-----+--------+------------+ ATA      250               1.76         [place]           +---------+------------------+-----+--------+------------+ Great Toe                               Not Obtained +---------+------------------+-----+--------+------------+ +---------+------------------+-----+----------+----------------------+ Left     Lt Pressure (mmHg)IndexWaveform  Comment                +---------+------------------+-----+----------+----------------------+ Brachial                                  Restricted Arm,Breast  +---------+------------------+-----+----------+----------------------+ ATA      156               1.10  biphasic                         +---------+------------------+-----+----------+----------------------+ PTA                             monophasic                       +---------+------------------+-----+----------+----------------------+  Great Toe                                 Bandaged, Not Obtained +---------+------------------+-----+----------+----------------------+ +-------+-----------+------------+------------+------------+ ABI/TBIToday's ABIToday's TBI Previous ABIPrevious TBI +-------+-----------+------------+------------+------------+ Right  >1.0 [place]    Not obtained1.20        .20          +-------+-----------+------------+------------+------------+ Left   1.10       Not obtained1.11        .39          +-------+-----------+------------+------------+------------+  Bilateral ABIs appear essentially unchanged compared to prior study on 05/15/2024.  Summary: Right: Resting right ankle-brachial index indicates noncompressible right lower extremity arteries.  Unable to obtain TBIs due to Patient's inability to remain still and Bandages covering the Toes. Left: Resting left ankle-brachial index is within normal range.  Unable to obtain TBIs due to Patient's inability to remain still and Bandages covering the Toes.  *See table(s) above for measurements and observations.  Electronically signed by Selinda Gu MD on 06/24/2024 at 3:56:05 PM.    Final    VAS US  ABI WITH/WO TBI Result Date: 05/20/2024  LOWER EXTREMITY DOPPLER STUDY Patient Name:  Sue Knapp  Date of Exam:   05/15/2024 Medical Rec #: [id number]          Accession #:    [id number] Date of Birth: 1923/05/02          Patient Gender: F Patient Age:   46 years Exam Location:  [facility] Procedure:      VAS US  ABI WITH/WO TBI Referring Phys: SELINDA DEW --------------------------------------------------------------------------------  Indications: Peripheral artery disease. left 2nd toe wound  Performing  Technologist: Jerel Croak RVT  Examination Guidelines: A complete evaluation includes at minimum, Doppler waveform signals and systolic blood pressure reading at the level of bilateral brachial, anterior tibial, and posterior tibial arteries, when vessel segments are accessible. Bilateral testing is considered an integral part of a complete examination. Photoelectric Plethysmograph (PPG) waveforms and toe systolic pressure readings are included as required and additional duplex testing as needed. Limited examinations for reoccurring indications may be performed as noted.  ABI Findings: +---------+------------------+-----+----------+--------+ Right    Rt Pressure (mmHg)IndexWaveform  Comment  +---------+------------------+-----+----------+--------+ Brachial 128                                       +---------+------------------+-----+----------+--------+ PTA      144               1.12 monophasic         +---------+------------------+-----+----------+--------+ DP       153               1.20 biphasic           +---------+------------------+-----+----------+--------+ Great Toe26                0.20 Abnormal           +---------+------------------+-----+----------+--------+ +---------+------------------+-----+----------+-------+ Left     Lt Pressure (mmHg)IndexWaveform  Comment +---------+------------------+-----+----------+-------+ PTA      123               0.96 monophasic        +---------+------------------+-----+----------+-------+ DP       142               1.11 monophasic        +---------+------------------+-----+----------+-------+  Great Toe50                0.39 Abnormal          +---------+------------------+-----+----------+-------+  Summary: Right: Resting right ankle-brachial index is within normal range. The right toe-brachial index is abnormal.  Left: Resting left ankle-brachial index is within normal range. The left toe-brachial index is abnormal.   *See table(s) above for measurements and observations.  Electronically signed by Selinda Gu MD on 05/20/2024 at 1:17:08 PM.    Final        Assessment & Plan:   1. Atherosclerosis of native arteries of the extremities with ulceration (HCC) (Primary)  Recommend:  The patient has evidence of severe atherosclerotic changes of both lower extremities associated with ulceration and tissue loss of the right foot.  This represents a limb threatening ischemia and places the patient at the risk for right limb loss.  Patient should undergo angiography of the right lower extremity with the hope for intervention for limb salvage.  The risks and benefits as well as the alternative therapies was discussed in detail with the patient.  All questions were answered.  Patient agrees to proceed with right angiography.  The patient will follow up with me in the office after the procedure.  - VAS US  ABI WITH/WO TBI; Future - VAS US  LOWER EXTREMITY ARTERIAL DUPLEX; Future  2. Cellulitis and abscess of toe of left foot Chronic left foot and heel ulcers due to peripheral arterial disease Chronic ulcers on the left foot and heel, exacerbated by peripheral arterial disease. Recent revascularization on the left leg. Multiple open wounds with fibrinous exudate indicating healing. Current bandaging technique is suboptimal, with preference for non-stick dressings. Santal ointment recommended for its debriding properties. Wound care center appointment delayed until January, but efforts to expedite are ongoing. - Prescribed Santal ointment from a specialty pharmacy. - Instructed to apply Santal ointment followed by gauze and wrap with Coban or Curlex and Ace wrap. - Referred to wound care center for specialized management. - Attempted to expedite wound care center appointment.   Current Outpatient Medications on File Prior to Visit  Medication Sig Dispense Refill   acetaminophen  (TYLENOL ) 650 MG CR tablet Take 2 tablets  (1,300 mg total) by mouth 2 (two) times daily as needed for pain.     apixaban  (ELIQUIS ) 2.5 MG TABS tablet Take 1 tablet (2.5 mg total) by mouth 2 (two) times daily. 60 tablet 6   atorvastatin  (LIPITOR) 10 MG tablet Take 1 tablet (10 mg total) by mouth daily. 30 tablet 11   hydrOXYzine (ATARAX) 10 MG tablet Take 10 mg by mouth every 12 (twelve) hours as needed for anxiety.     hyoscyamine (LEVSIN) 0.125 MG tablet Take 0.125 mg by mouth every 8 (eight) hours as needed (bloating, GI upset).     Lactobacillus (AZO DUAL PROTECTION) CAPS Take 1 capsule by mouth daily. 30 capsule 11   losartan  (COZAAR ) 50 MG tablet Take 50 mg by mouth daily.     melatonin 5 MG TABS Take 5 mg by mouth at bedtime as needed (insomnia).     metoprolol  succinate (TOPROL -XL) 50 MG 24 hr tablet TAKE 1 TABLET BY MOUTH DAILY 30 tablet 6   Multiple Vitamins-Minerals (MULTIVITAMIN WITH IRON-MINERALS) liquid Take by mouth daily.     Multiple Vitamins-Minerals (MULTIVITAMIN WITH MINERALS) tablet Take 1 tablet by mouth daily. Centrum silver 50+     NYAMYC powder Apply topically.     omeprazole  (PRILOSEC  OTC) 20 MG tablet  Take 1 tablet (20 mg total) by mouth daily. 30 tablet 0   polyethylene glycol (MIRALAX / GLYCOLAX) 17 g packet Take 17 g by mouth at bedtime as needed for mild constipation, moderate constipation or severe constipation.     torsemide (DEMADEX) 20 MG tablet Take 20 mg by mouth daily.     trimethoprim (TRIMPEX) 100 MG tablet Take 100 mg by mouth 2 (two) times daily.     zinc oxide (BALMEX) 11.3 % CREA cream Apply 1 Application topically 2 (two) times daily as needed.     No current facility-administered medications on file prior to visit.    Patient Instructions   VISIT SUMMARY: During today's visit, we discussed the multiple leg wounds you are experiencing and your concerns about wound care. We reviewed your recent revascularization procedure on your left leg and the new wounds that have developed on your heel.  We also addressed your chronic ulcers, peripheral arterial disease, and the planned revascularization for your right leg.  YOUR PLAN: -CHRONIC LEFT FOOT AND HEEL ULCERS DUE TO PERIPHERAL ARTERIAL DISEASE: You have chronic ulcers on your left foot and heel, which are worsened by poor blood flow. We recommend using Santal ointment, which helps clean the wounds, and changing your bandaging technique to use non-stick dressings like Coban or Curlex. We are also working to get you an earlier appointment at the wound care center.  -PERIPHERAL ARTERIAL DISEASE OF BILATERAL LOWER EXTREMITIES: Peripheral arterial disease means that the blood flow to your legs is reduced. Recent tests show improved blood flow in your left leg. We will continue to monitor your condition and have scheduled another blood flow test in four weeks.  -PLANNED RIGHT LEG REVASCULARIZATION FOR ARTERIAL INSUFFICIENCY AND ULCERS: We are planning a procedure to improve blood flow in your right leg, which has ulcers and poor circulation. This procedure was delayed due to a recent accident, but we are working to reschedule it soon.  INSTRUCTIONS: Please apply Santal ointment to your wounds, cover with gauze, and wrap with Coban or Curlex and an Ace wrap as instructed. Attend your scheduled arterial duplex in four weeks to monitor blood flow. We will notify you once we have rescheduled your right leg revascularization procedure. Continue to monitor your wounds and contact us  if you notice any changes or have concerns.   Please Avoid use of sticky bandages on or near wound sites, except for area of Right leg                    Contains text generated by [employer]bridge.                                 Contains text generated by [employer]bridge.   Return in about 4 weeks (around 07/01/2024) for ABI and BLE art duplex JD/FB.   Kalyani Maeda E Keiton Cosma, NP

## 2024-06-29 ENCOUNTER — Telehealth: Payer: Self-pay

## 2024-06-29 ENCOUNTER — Telehealth: Payer: Self-pay | Admitting: Nurse Practitioner

## 2024-06-29 NOTE — Telephone Encounter (Signed)
 Nursing Home is calling to verify new medications orders. Please call [phone] Mliss

## 2024-06-29 NOTE — Telephone Encounter (Signed)
 Niels is her daughter who called  [phone]

## 2024-06-29 NOTE — Telephone Encounter (Signed)
 [facility] needs you to fax them orders for the new way she needs to take her medicine that they discussed with Seema last week. They cannot do anything with the prescription until they have orders from Seema. Please call Niels with questions.

## 2024-06-29 NOTE — Telephone Encounter (Signed)
 Spoke with patient's daughter and have sent records with instructions to Holley Molt at [facility] re; new Rx for tramadol  50 mg and 100 mg ER.  This change was made in an effort to help with increased somnolence of patient.

## 2024-06-29 NOTE — Telephone Encounter (Signed)
 Returned phone call to Mliss at [facility] facility./  she has received faxed notes with plan of care as discussed with Seema and patient's daughter  Niels.  She will make a note of the changes, also asked if, when we make changes that we call and let them know of any medications changes.

## 2024-07-06 ENCOUNTER — Other Ambulatory Visit: Payer: Self-pay

## 2024-07-06 ENCOUNTER — Ambulatory Visit
Admission: RE | Admit: 2024-07-06 | Discharge: 2024-07-06 | Disposition: A | Attending: Vascular Surgery | Admitting: Vascular Surgery

## 2024-07-06 ENCOUNTER — Encounter: Admission: RE | Disposition: A | Payer: Self-pay | Attending: Vascular Surgery

## 2024-07-06 ENCOUNTER — Encounter: Payer: Self-pay | Admitting: Vascular Surgery

## 2024-07-06 DIAGNOSIS — M170 Bilateral primary osteoarthritis of knee: Secondary | ICD-10-CM | POA: Diagnosis not present

## 2024-07-06 DIAGNOSIS — Z993 Dependence on wheelchair: Secondary | ICD-10-CM | POA: Diagnosis not present

## 2024-07-06 DIAGNOSIS — L97429 Non-pressure chronic ulcer of left heel and midfoot with unspecified severity: Secondary | ICD-10-CM | POA: Insufficient documentation

## 2024-07-06 DIAGNOSIS — I70239 Atherosclerosis of native arteries of right leg with ulceration of unspecified site: Secondary | ICD-10-CM

## 2024-07-06 DIAGNOSIS — Z9889 Other specified postprocedural states: Secondary | ICD-10-CM

## 2024-07-06 DIAGNOSIS — I70229 Atherosclerosis of native arteries of extremities with rest pain, unspecified extremity: Secondary | ICD-10-CM

## 2024-07-06 DIAGNOSIS — I70244 Atherosclerosis of native arteries of left leg with ulceration of heel and midfoot: Secondary | ICD-10-CM | POA: Insufficient documentation

## 2024-07-06 DIAGNOSIS — I70234 Atherosclerosis of native arteries of right leg with ulceration of heel and midfoot: Secondary | ICD-10-CM | POA: Diagnosis not present

## 2024-07-06 DIAGNOSIS — L97419 Non-pressure chronic ulcer of right heel and midfoot with unspecified severity: Secondary | ICD-10-CM | POA: Diagnosis not present

## 2024-07-06 DIAGNOSIS — I70249 Atherosclerosis of native arteries of left leg with ulceration of unspecified site: Secondary | ICD-10-CM | POA: Diagnosis not present

## 2024-07-06 DIAGNOSIS — L97919 Non-pressure chronic ulcer of unspecified part of right lower leg with unspecified severity: Secondary | ICD-10-CM | POA: Diagnosis not present

## 2024-07-06 DIAGNOSIS — L97929 Non-pressure chronic ulcer of unspecified part of left lower leg with unspecified severity: Secondary | ICD-10-CM

## 2024-07-06 HISTORY — PX: LOWER EXTREMITY ANGIOGRAPHY: CATH118251

## 2024-07-06 LAB — CREATININE, SERUM
Creatinine, Ser: 1.62 mg/dL — ABNORMAL HIGH (ref 0.44–1.00)
GFR, Estimated: 28 mL/min — ABNORMAL LOW (ref >60)

## 2024-07-06 LAB — BUN: BUN: 66 mg/dL — ABNORMAL HIGH (ref 8–23)

## 2024-07-06 SURGERY — LOWER EXTREMITY ANGIOGRAPHY
Anesthesia: Moderate Sedation | Site: Leg Lower | Laterality: Right

## 2024-07-06 MED ORDER — SODIUM CHLORIDE 0.9 % IV SOLN: 250.0000 mL | INTRAVENOUS | Status: DC | PRN

## 2024-07-06 MED ORDER — NITROGLYCERIN 1 MG/10 ML FOR IR/CATH LAB
INTRA_ARTERIAL | Status: AC
  Filled 2024-07-06: qty 10

## 2024-07-06 MED ORDER — MIDAZOLAM HCL (PF) 2 MG/2ML IJ SOLN
INTRAMUSCULAR | Status: DC | PRN
  Administered 2024-07-06: 11:00:00 1 mg via INTRAVENOUS

## 2024-07-06 MED ORDER — LABETALOL HCL 5 MG/ML IV SOLN: 10.0000 mg | INTRAVENOUS | Status: DC | PRN

## 2024-07-06 MED ORDER — ACETAMINOPHEN 325 MG PO TABS: 650.0000 mg | ORAL_TABLET | ORAL | Status: DC | PRN

## 2024-07-06 MED ORDER — HEPARIN SODIUM (PORCINE) 1000 UNIT/ML IJ SOLN
INTRAMUSCULAR | Status: DC | PRN
  Administered 2024-07-06: 11:00:00 4000 [IU] via INTRAVENOUS

## 2024-07-06 MED ORDER — LIDOCAINE-EPINEPHRINE (PF) 1 %-1:200000 IJ SOLN
INTRAMUSCULAR | Status: DC | PRN
  Administered 2024-07-06: 11:00:00 10 mL

## 2024-07-06 MED ORDER — CEFAZOLIN SODIUM-DEXTROSE 2-4 GM/100ML-% IV SOLN
2.0000 g | INTRAVENOUS | Status: AC
  Administered 2024-07-06: 11:00:00 2 g via INTRAVENOUS

## 2024-07-06 MED ORDER — ONDANSETRON HCL 4 MG/2ML IJ SOLN: 4.0000 mg | Freq: Four times a day (QID) | INTRAMUSCULAR | Status: DC | PRN

## 2024-07-06 MED ORDER — HYDROMORPHONE HCL 1 MG/ML IJ SOLN: 1.0000 mg | Freq: Once | INTRAMUSCULAR | Status: DC | PRN

## 2024-07-06 MED ORDER — SODIUM CHLORIDE 0.9 % IV SOLN: INTRAVENOUS | Status: DC

## 2024-07-06 MED ORDER — MIDAZOLAM HCL 2 MG/2ML IJ SOLN
INTRAMUSCULAR | Status: AC
  Filled 2024-07-06: qty 2

## 2024-07-06 MED ORDER — FENTANYL CITRATE (PF) 100 MCG/2ML IJ SOLN
INTRAMUSCULAR | Status: AC
  Filled 2024-07-06: qty 2

## 2024-07-06 MED ORDER — NITROGLYCERIN 1 MG/10 ML FOR IR/CATH LAB
INTRA_ARTERIAL | Status: DC | PRN
  Administered 2024-07-06: 11:00:00 200 ug via INTRA_ARTERIAL

## 2024-07-06 MED ORDER — METHYLPREDNISOLONE SODIUM SUCC 125 MG IJ SOLR: 125.0000 mg | Freq: Once | INTRAMUSCULAR | Status: DC | PRN

## 2024-07-06 MED ORDER — DIPHENHYDRAMINE HCL 50 MG/ML IJ SOLN: 50.0000 mg | Freq: Once | INTRAMUSCULAR | Status: DC | PRN

## 2024-07-06 MED ORDER — MIDAZOLAM HCL 2 MG/ML PO SYRP: 8.0000 mg | ORAL_SOLUTION | Freq: Once | ORAL | Status: DC | PRN

## 2024-07-06 MED ORDER — HEPARIN (PORCINE) IN NACL 1000-0.9 UT/500ML-% IV SOLN
INTRAVENOUS | Status: DC | PRN
  Administered 2024-07-06: 11:00:00 1000 mL

## 2024-07-06 MED ORDER — IODIXANOL 320 MG/ML IV SOLN
INTRAVENOUS | Status: DC | PRN
  Administered 2024-07-06: 11:00:00 65 mL

## 2024-07-06 MED ORDER — FENTANYL CITRATE (PF) 100 MCG/2ML IJ SOLN
INTRAMUSCULAR | Status: DC | PRN
  Administered 2024-07-06 (×2): 25 ug via INTRAVENOUS

## 2024-07-06 MED ORDER — CEFAZOLIN SODIUM-DEXTROSE 2-4 GM/100ML-% IV SOLN
INTRAVENOUS | Status: AC
  Filled 2024-07-06: qty 100

## 2024-07-06 MED ORDER — FAMOTIDINE 20 MG PO TABS: 40.0000 mg | ORAL_TABLET | Freq: Once | ORAL | Status: DC | PRN

## 2024-07-06 MED ORDER — SODIUM CHLORIDE 0.9% FLUSH: 3.0000 mL | Freq: Two times a day (BID) | INTRAVENOUS | Status: DC

## 2024-07-06 MED ORDER — HYDRALAZINE HCL 20 MG/ML IJ SOLN: 5.0000 mg | INTRAMUSCULAR | Status: DC | PRN

## 2024-07-06 MED ORDER — HEPARIN SODIUM (PORCINE) 1000 UNIT/ML IJ SOLN
INTRAMUSCULAR | Status: AC
  Filled 2024-07-06: qty 10

## 2024-07-06 MED ORDER — SODIUM CHLORIDE 0.9% FLUSH: 3.0000 mL | INTRAVENOUS | Status: DC | PRN

## 2024-07-06 SURGICAL SUPPLY — 19 items
BALLOON LUTONIX 018 4X220X130 (BALLOONS) IMPLANT
BALLOON LUTONIX 018 5X100X130 (BALLOONS) IMPLANT
BALLOON LUTONIX DCB 5X100X130 (BALLOONS) IMPLANT
BALLOON ULTRVRSE 2X150X150 OTW (BALLOONS) IMPLANT
CATH ANGIO 5F PIGTAIL 65CM (CATHETERS) IMPLANT
CATH VERT 5X100 (CATHETERS) IMPLANT
COVER PROBE ULTRASOUND 5X96 (MISCELLANEOUS) IMPLANT
DEVICE PRESTO INFLATION (MISCELLANEOUS) IMPLANT
DEVICE VASC CLSR CELT ART 6 (Vascular Products) IMPLANT
GLIDEWIRE ADV .035X260CM (WIRE) IMPLANT
PACK ANGIOGRAPHY (CUSTOM PROCEDURE TRAY) ×1 IMPLANT
SHEATH ANL2 6FRX45 HC (SHEATH) IMPLANT
SHEATH BRITE TIP 5FRX11 (SHEATH) IMPLANT
SHEATH BRITE TIP 6FRX11 (SHEATH) IMPLANT
STENT LIFESTENT 5F 6X100X135 (Permanent Stent) IMPLANT
SYR MEDRAD MARK 7 150ML (SYRINGE) IMPLANT
TUBING CONTRAST HIGH PRESS 72 (TUBING) IMPLANT
WIRE G V18X300CM (WIRE) IMPLANT
WIRE J 3MM .035X145CM (WIRE) IMPLANT

## 2024-07-06 NOTE — Interval H&P Note (Signed)
 History and Physical Interval Note:  07/06/2024 9:11 AM  Sue Knapp Staff  has presented today for surgery, with the diagnosis of RLE Angio   ASO w rest pain.  The various methods of treatment have been discussed with the patient and family. After consideration of risks, benefits and other options for treatment, the patient has consented to  Procedures: Lower Extremity Angiography (Right) as a surgical intervention.  The patient's history has been reviewed, patient examined, no change in status, stable for surgery.  I have reviewed the patient's chart and labs.  Questions were answered to the patient's satisfaction.     [name]

## 2024-07-06 NOTE — OR Nursing (Signed)
 Bruising noted left neck left pacemaker site, right hip. Complaining of vaginal pain, daughter thinks its a bed sore, no indication of bed sore. Daughter reports Eliquis  held for last three days per daughter. Both feet wrapped in bandages. Both feet cool to touch

## 2024-07-06 NOTE — Op Note (Signed)
 [facility]  Percutaneous Study/Intervention Procedural Note   Date of Surgery: 07/06/2024  Surgeon(s):Voncile Schwarz    Assistants:none  Pre-operative Diagnosis: PAD with ulceration bilateral lower extremities  Post-operative diagnosis:  Same  Procedure(s) Performed:             1.  Ultrasound guidance for vascular access left femoral artery             2.  Catheter placement into right common femoral artery from left femoral approach             3.  Aortogram and selective right lower extremity angiogram             4.  Percutaneous transluminal angioplasty of right distal SFA and popliteal artery with 4 mm diameter by 22 cm length Lutonix drug-coated angioplasty balloon             5.  Percutaneous transluminal angioplasty of the right peroneal artery with 2 mm diameter by 15 cm length angioplasty balloon  6.  Stent placement to the right distal SFA and proximal popliteal artery with 6 mm diameter by 12 cm length life stent             7.  Celt closure device left femoral artery  EBL: 5 cc  Contrast: 65 cc  Fluoro Time: 5.2 minutes  Moderate Conscious Sedation Time: approximately 35 minutes using 1 mg of Versed  and 50 mcg of Fentanyl               Indications:  Patient is a 89 y.o.female with PAD and nonhealing ulcerations of the right lower extremity.  She has already undergone intervention for the left lower extremity for atherosclerosis with ulceration. The patient has noninvasive study showing noncompressible vessels bilaterally. The patient is brought in for angiography for further evaluation and potential treatment.  Due to the limb threatening nature of the situation, angiogram was performed for attempted limb salvage. The patient is aware that if the procedure fails, amputation would be expected.  The patient also understands that even with successful revascularization, amputation may still be required due to the severity of the situation. Risks and  benefits are discussed and informed consent is obtained.   Procedure:  The patient was identified and appropriate procedural time out was performed.  The patient was then placed supine on the table and prepped and draped in the usual sterile fashion. Moderate conscious sedation was administered during a face to face encounter with the patient throughout the procedure with my supervision of the RN administering medicines and monitoring the patient's vital signs, pulse oximetry, telemetry and mental status throughout from the start of the procedure until the patient was taken to the recovery room. Ultrasound was used to evaluate the left common femoral artery.  It was patent .  A digital ultrasound image was acquired.  A Seldinger needle was used to access the left common femoral artery under direct ultrasound guidance and a permanent image was performed.  A 0.035 J wire was advanced without resistance and a 5Fr sheath was placed.  Pigtail catheter was placed into the aorta and an AP aortogram was performed. This demonstrated normal renal arteries and normal aorta and iliac segments without significant stenosis. I then crossed the aortic bifurcation and advanced to the right femoral head. Selective right lower extremity angiogram was then performed. This demonstrated highly calcific but not stenotic common femoral artery, proximal and mid superficial femoral artery.  In the distal superficial femoral artery and  above-knee popliteal artery there was a high-grade stenosis in the 80 to 90% range.  The distal popliteal artery normalized.  There was then severe tibial disease.  The anterior tibial artery was patent proximally but then occluded and did not continue to the foot.  The posterior tibial artery was chronically occluded without distal reconstitution.  The peroneal artery was the only distal runoff but it had multiple areas of disease.  The tibioperoneal trunk and most proximal peroneal artery had about an 80%  stenosis in the proximal to mid peroneal artery had about a 90% stenosis.  The vessel was small and calcified and did not fill the foot particularly well but was the only runoff distally.  Of note, images were extremely poor due to continuous patient motion. It was felt that it was in the patient's best interest to proceed with intervention after these images to avoid a second procedure and a larger amount of contrast and fluoroscopy based off of the findings from the initial angiogram. The patient was systemically heparinized and a 6 French Ansell sheath was then placed over the [employer] wire. I then used a Kumpe catheter and the advantage wire to navigate through the SFA and popliteal disease and then get down into the tibioperoneal trunk where I exchanged for a V18 wire.  I crossed the stenosis and parked the wire at the ankle.  The peroneal artery was treated with a 2 mm diameter by 15 cm length angioplasty balloon inflated to 10 atm for 1 minute.  The above-knee popliteal artery and distal SFA were addressed with a 4 mm diameter by 22 cm length Lutonix drug-coated angioplasty balloon inflated to 10 atm for 1 minute.  Completion imaging was somewhat difficult due to her motion, but the peroneal artery did now appear to be patent after intra-arterial nitroglycerin  was given for spasm and a catheter was used to image this that was parked in the tibioperoneal trunk.  It appeared the residual stenosis after treatment was less than 30%.  The Hunter's canal lesion remained greater than 50% after angioplasty so we elected to place a stent.  The advantage wire was replaced and a 6 mm diameter by 12 cm length life stent was selected and deployed in the distal SFA and above-knee popliteal artery and postdilated with a 5 mm diameter Lutonix drug-coated balloon with less than 10% residual stenosis after stent placement. I elected to terminate the procedure. The sheath was removed and Celt closure device was deployed  in the left femoral artery with excellent hemostatic result. The patient was taken to the recovery room in stable condition having tolerated the procedure well.  Findings:               Aortogram:  This demonstrated normal renal arteries and normal aorta and iliac segments without significant stenosis.             Right Lower Extremity:  This demonstrated highly calcific but not stenotic common femoral artery, proximal and mid superficial femoral artery.  In the distal superficial femoral artery and above-knee popliteal artery there was a high-grade stenosis in the 80 to 90% range.  The distal popliteal artery normalized.  There was then severe tibial disease.  The anterior tibial artery was patent proximally but then occluded and did not continue to the foot.  The posterior tibial artery was chronically occluded without distal reconstitution.  The peroneal artery was the only distal runoff but it had multiple areas of disease.  The tibioperoneal trunk  and most proximal peroneal artery had about an 80% stenosis in the proximal to mid peroneal artery had about a 90% stenosis.  The vessel was small and calcified and did not fill the foot particularly well but was the only runoff distally.  Of note, images were extremely poor due to continuous patient motion.   Disposition: Patient was taken to the recovery room in stable condition having tolerated the procedure well.  Complications: None  Selinda Gu 07/06/2024 11:25 AM   This note was created with Dragon Medical transcription system. Any errors in dictation are purely unintentional.

## 2024-07-06 NOTE — Discharge Instructions (Addendum)
 Resume Eliquis  this evening. Remove left groin dressing on Tuesday 12/16/20205

## 2024-07-10 ENCOUNTER — Telehealth (INDEPENDENT_AMBULATORY_CARE_PROVIDER_SITE_OTHER): Payer: Self-pay

## 2024-07-10 NOTE — Telephone Encounter (Signed)
 Pt's  daughter called into nurse triage line to inquire on her wound care appointment on Monday the 22 nd following her recent procedure. She reports she would like to coordinate wound care at her facility she is currently housed at [facility] taking her out for an appointment if this is possible. Patient is currently at [facility] in [place]. Please advise

## 2024-07-10 NOTE — Telephone Encounter (Signed)
 Call to patient's daughter she reports she would like to postpone the appointment for her mother on Monday, advised her that when she reschedules she can ask that they forward orders for wound care to the facility for care after she has been seen. Also advised her that I am not sure of how soon she will be able to get back on the schedule if she does decide to reschedule. No further questions at this time.

## 2024-07-10 NOTE — Telephone Encounter (Signed)
 That is only a week after her procedure, we usually don't have pt's follow up that shortly

## 2024-07-13 ENCOUNTER — Encounter: Admitting: Physician Assistant

## 2024-07-13 ENCOUNTER — Ambulatory Visit

## 2024-07-13 ENCOUNTER — Encounter

## 2024-07-13 DIAGNOSIS — I495 Sick sinus syndrome: Secondary | ICD-10-CM

## 2024-07-14 LAB — CUP PACEART REMOTE DEVICE CHECK
Battery Remaining Longevity: 152 mo
Battery Voltage: 3.2 V
Brady Statistic AP VP Percent: 0 %
Brady Statistic AP VS Percent: 0 %
Brady Statistic AS VP Percent: 98.49 %
Brady Statistic AS VS Percent: 1.51 %
Brady Statistic RA Percent Paced: 0 %
Brady Statistic RV Percent Paced: 98.49 %
Date Time Interrogation Session: 20251221182800
Implantable Lead Connection Status: 753985
Implantable Lead Connection Status: 753985
Implantable Lead Implant Date: 20141103
Implantable Lead Implant Date: 20141103
Implantable Lead Location: 753859
Implantable Lead Location: 753860
Implantable Lead Model: 5092
Implantable Lead Model: 5592
Implantable Pulse Generator Implant Date: 20250721
Lead Channel Impedance Value: 266 Ohm
Lead Channel Impedance Value: 342 Ohm
Lead Channel Impedance Value: 399 Ohm
Lead Channel Impedance Value: 494 Ohm
Lead Channel Pacing Threshold Amplitude: 0.625 V
Lead Channel Pacing Threshold Pulse Width: 0.4 ms
Lead Channel Sensing Intrinsic Amplitude: 8.125 mV
Lead Channel Sensing Intrinsic Amplitude: 8.125 mV
Lead Channel Setting Pacing Amplitude: 2 V
Lead Channel Setting Pacing Pulse Width: 0.4 ms
Lead Channel Setting Sensing Sensitivity: 2 mV
Zone Setting Status: 755011

## 2024-07-15 NOTE — Progress Notes (Signed)
 Remote PPM Transmission

## 2024-07-19 ENCOUNTER — Ambulatory Visit: Payer: Self-pay | Admitting: Cardiology

## 2024-07-20 ENCOUNTER — Ambulatory Visit: Admitting: Family

## 2024-07-20 ENCOUNTER — Telehealth: Payer: Self-pay | Admitting: Nurse Practitioner

## 2024-07-20 NOTE — Telephone Encounter (Signed)
 Phone number is [phone]

## 2024-07-20 NOTE — Telephone Encounter (Signed)
 Pt daughter stated that she will be passing away in a few days and need to speak with you about the meds you prescribed her. She wanted to know about going back to a higher doses also. She did not indicate which one.

## 2024-07-21 ENCOUNTER — Ambulatory Visit: Payer: Self-pay | Admitting: Family

## 2024-07-22 ENCOUNTER — Encounter (INDEPENDENT_AMBULATORY_CARE_PROVIDER_SITE_OTHER)

## 2024-07-22 ENCOUNTER — Other Ambulatory Visit (INDEPENDENT_AMBULATORY_CARE_PROVIDER_SITE_OTHER): Payer: Self-pay | Admitting: Vascular Surgery

## 2024-07-22 ENCOUNTER — Ambulatory Visit (INDEPENDENT_AMBULATORY_CARE_PROVIDER_SITE_OTHER): Admitting: Nurse Practitioner

## 2024-07-22 DIAGNOSIS — Z9889 Other specified postprocedural states: Secondary | ICD-10-CM

## 2024-07-23 DEATH — deceased

## 2024-07-24 ENCOUNTER — Telehealth: Payer: Self-pay

## 2024-07-24 NOTE — Telephone Encounter (Signed)
 Copied from CRM #[id number]. Topic: General - Deceased Patient >> 08-08-2024  9:56 AM Adelita E wrote: Name of caller: Niels Corrigan - patient's daughter  Date of death: August 06, 2024 - Daughter called to inform PCP of patient's passing.   Name of funeral home: NA  Phone number of funeral home: NA  Provider that needs to sign form: NA  Timeline for signing: NA

## 2024-07-31 ENCOUNTER — Ambulatory Visit (INDEPENDENT_AMBULATORY_CARE_PROVIDER_SITE_OTHER): Admitting: Nurse Practitioner

## 2024-07-31 ENCOUNTER — Encounter (INDEPENDENT_AMBULATORY_CARE_PROVIDER_SITE_OTHER)

## 2024-08-03 ENCOUNTER — Encounter: Admitting: Physician Assistant

## 2024-10-12 ENCOUNTER — Encounter

## 2025-01-11 ENCOUNTER — Encounter

## 2025-04-12 ENCOUNTER — Encounter
